# Patient Record
Sex: Male | Born: 1940 | Race: Black or African American | Hispanic: No | Marital: Married | Smoking: Former smoker
Health system: Southern US, Community
[De-identification: ages and names within clinical notes are randomized; demographics above are authoritative.]

## PROBLEM LIST (undated history)

## (undated) DIAGNOSIS — F419 Anxiety disorder, unspecified: Secondary | ICD-10-CM

## (undated) DIAGNOSIS — M4802 Spinal stenosis, cervical region: Secondary | ICD-10-CM

## (undated) DIAGNOSIS — D649 Anemia, unspecified: Secondary | ICD-10-CM

## (undated) DIAGNOSIS — I639 Cerebral infarction, unspecified: Secondary | ICD-10-CM

## (undated) DIAGNOSIS — W19XXXA Unspecified fall, initial encounter: Secondary | ICD-10-CM

## (undated) DIAGNOSIS — F329 Major depressive disorder, single episode, unspecified: Secondary | ICD-10-CM

## (undated) DIAGNOSIS — E785 Hyperlipidemia, unspecified: Secondary | ICD-10-CM

## (undated) DIAGNOSIS — I10 Essential (primary) hypertension: Secondary | ICD-10-CM

## (undated) DIAGNOSIS — R296 Repeated falls: Secondary | ICD-10-CM

## (undated) DIAGNOSIS — R443 Hallucinations, unspecified: Secondary | ICD-10-CM

## (undated) DIAGNOSIS — F32A Depression, unspecified: Secondary | ICD-10-CM

## (undated) HISTORY — DX: Anxiety disorder, unspecified: F41.9

## (undated) HISTORY — PX: POSTERIOR CERVICAL LAMINECTOMY: SHX2248

## (undated) HISTORY — DX: Hyperlipidemia, unspecified: E78.5

## (undated) HISTORY — DX: Anemia, unspecified: D64.9

## (undated) HISTORY — PX: HERNIA REPAIR: SHX51

## (undated) HISTORY — DX: Spinal stenosis, cervical region: M48.02

## (undated) HISTORY — DX: Repeated falls: R29.6

## (undated) HISTORY — DX: Cerebral infarction, unspecified: I63.9

## (undated) HISTORY — PX: EYE SURGERY: SHX253

## (undated) HISTORY — DX: Unspecified fall, initial encounter: W19.XXXA

---

## 2002-06-09 ENCOUNTER — Emergency Department (HOSPITAL_COMMUNITY): Admission: EM | Admit: 2002-06-09 | Discharge: 2002-06-09 | Payer: Self-pay | Admitting: Emergency Medicine

## 2002-06-09 ENCOUNTER — Encounter: Payer: Self-pay | Admitting: Emergency Medicine

## 2002-06-18 ENCOUNTER — Encounter: Payer: Self-pay | Admitting: General Surgery

## 2002-06-18 ENCOUNTER — Ambulatory Visit (HOSPITAL_COMMUNITY): Admission: RE | Admit: 2002-06-18 | Discharge: 2002-06-18 | Payer: Self-pay | Admitting: General Surgery

## 2002-06-20 ENCOUNTER — Ambulatory Visit (HOSPITAL_COMMUNITY): Admission: RE | Admit: 2002-06-20 | Discharge: 2002-06-20 | Payer: Self-pay | Admitting: Internal Medicine

## 2002-06-21 ENCOUNTER — Encounter: Payer: Self-pay | Admitting: General Surgery

## 2005-05-31 ENCOUNTER — Emergency Department (HOSPITAL_COMMUNITY): Admission: EM | Admit: 2005-05-31 | Discharge: 2005-05-31 | Payer: Self-pay | Admitting: Emergency Medicine

## 2009-05-27 ENCOUNTER — Ambulatory Visit (HOSPITAL_COMMUNITY): Admission: RE | Admit: 2009-05-27 | Discharge: 2009-05-27 | Payer: Self-pay | Admitting: Ophthalmology

## 2009-12-03 ENCOUNTER — Ambulatory Visit (HOSPITAL_COMMUNITY): Admission: RE | Admit: 2009-12-03 | Discharge: 2009-12-03 | Payer: Self-pay | Admitting: Family Medicine

## 2011-02-27 LAB — HEMOGLOBIN AND HEMATOCRIT, BLOOD: HCT: 35.1 % — ABNORMAL LOW (ref 39.0–52.0)

## 2011-02-27 LAB — BASIC METABOLIC PANEL
BUN: 15 mg/dL (ref 6–23)
CO2: 30 mEq/L (ref 19–32)
Calcium: 9.6 mg/dL (ref 8.4–10.5)
Chloride: 102 mEq/L (ref 96–112)
Creatinine, Ser: 1.14 mg/dL (ref 0.4–1.5)
GFR calc Af Amer: >60 mL/min (ref >60)
GFR calc non Af Amer: >60 mL/min (ref >60)
Glucose, Bld: 102 mg/dL — ABNORMAL HIGH (ref 70–99)
Potassium: 3.8 mEq/L (ref 3.5–5.1)
Sodium: 135 mEq/L (ref 135–145)

## 2014-02-12 DIAGNOSIS — E7800 Pure hypercholesterolemia, unspecified: Secondary | ICD-10-CM | POA: Diagnosis not present

## 2014-02-12 DIAGNOSIS — I10 Essential (primary) hypertension: Secondary | ICD-10-CM | POA: Diagnosis not present

## 2014-06-06 DIAGNOSIS — I10 Essential (primary) hypertension: Secondary | ICD-10-CM | POA: Diagnosis not present

## 2014-07-16 DIAGNOSIS — I10 Essential (primary) hypertension: Secondary | ICD-10-CM | POA: Diagnosis not present

## 2014-07-16 DIAGNOSIS — H5210 Myopia, unspecified eye: Secondary | ICD-10-CM | POA: Diagnosis not present

## 2014-07-16 DIAGNOSIS — H35039 Hypertensive retinopathy, unspecified eye: Secondary | ICD-10-CM | POA: Diagnosis not present

## 2014-09-24 DIAGNOSIS — E785 Hyperlipidemia, unspecified: Secondary | ICD-10-CM | POA: Diagnosis not present

## 2014-09-24 DIAGNOSIS — D649 Anemia, unspecified: Secondary | ICD-10-CM | POA: Diagnosis not present

## 2014-09-24 DIAGNOSIS — I10 Essential (primary) hypertension: Secondary | ICD-10-CM | POA: Diagnosis not present

## 2014-10-28 DIAGNOSIS — Z0000 Encounter for general adult medical examination without abnormal findings: Secondary | ICD-10-CM | POA: Diagnosis not present

## 2014-12-23 DIAGNOSIS — J029 Acute pharyngitis, unspecified: Secondary | ICD-10-CM | POA: Diagnosis not present

## 2014-12-23 DIAGNOSIS — D649 Anemia, unspecified: Secondary | ICD-10-CM | POA: Diagnosis not present

## 2015-03-17 DIAGNOSIS — D649 Anemia, unspecified: Secondary | ICD-10-CM | POA: Diagnosis not present

## 2015-03-17 DIAGNOSIS — I10 Essential (primary) hypertension: Secondary | ICD-10-CM | POA: Diagnosis not present

## 2015-06-17 DIAGNOSIS — I10 Essential (primary) hypertension: Secondary | ICD-10-CM | POA: Diagnosis not present

## 2015-06-17 DIAGNOSIS — D649 Anemia, unspecified: Secondary | ICD-10-CM | POA: Diagnosis not present

## 2015-10-07 DIAGNOSIS — F064 Anxiety disorder due to known physiological condition: Secondary | ICD-10-CM | POA: Diagnosis not present

## 2015-10-07 DIAGNOSIS — I10 Essential (primary) hypertension: Secondary | ICD-10-CM | POA: Diagnosis not present

## 2015-11-24 DIAGNOSIS — H2513 Age-related nuclear cataract, bilateral: Secondary | ICD-10-CM | POA: Diagnosis not present

## 2015-12-07 DIAGNOSIS — I10 Essential (primary) hypertension: Secondary | ICD-10-CM | POA: Diagnosis not present

## 2015-12-10 DIAGNOSIS — H2512 Age-related nuclear cataract, left eye: Secondary | ICD-10-CM | POA: Diagnosis not present

## 2015-12-10 DIAGNOSIS — H40013 Open angle with borderline findings, low risk, bilateral: Secondary | ICD-10-CM | POA: Diagnosis not present

## 2015-12-10 DIAGNOSIS — Z961 Presence of intraocular lens: Secondary | ICD-10-CM | POA: Diagnosis not present

## 2015-12-10 DIAGNOSIS — H35031 Hypertensive retinopathy, right eye: Secondary | ICD-10-CM | POA: Diagnosis not present

## 2015-12-10 DIAGNOSIS — H26491 Other secondary cataract, right eye: Secondary | ICD-10-CM | POA: Diagnosis not present

## 2015-12-10 DIAGNOSIS — H35032 Hypertensive retinopathy, left eye: Secondary | ICD-10-CM | POA: Diagnosis not present

## 2015-12-10 DIAGNOSIS — H25012 Cortical age-related cataract, left eye: Secondary | ICD-10-CM | POA: Diagnosis not present

## 2015-12-14 DIAGNOSIS — H26491 Other secondary cataract, right eye: Secondary | ICD-10-CM | POA: Diagnosis not present

## 2015-12-22 DIAGNOSIS — H2512 Age-related nuclear cataract, left eye: Secondary | ICD-10-CM | POA: Diagnosis not present

## 2016-01-18 DIAGNOSIS — I10 Essential (primary) hypertension: Secondary | ICD-10-CM | POA: Diagnosis not present

## 2016-01-18 DIAGNOSIS — D649 Anemia, unspecified: Secondary | ICD-10-CM | POA: Diagnosis not present

## 2016-01-18 DIAGNOSIS — E785 Hyperlipidemia, unspecified: Secondary | ICD-10-CM | POA: Diagnosis not present

## 2016-01-22 ENCOUNTER — Emergency Department (INDEPENDENT_AMBULATORY_CARE_PROVIDER_SITE_OTHER)
Admission: EM | Admit: 2016-01-22 | Discharge: 2016-01-22 | Disposition: A | Discharge status: Home / Self Care | Payer: Medicare Other | Source: Home / Self Care | Attending: Family Medicine | Admitting: Family Medicine

## 2016-01-22 ENCOUNTER — Encounter (HOSPITAL_COMMUNITY): Payer: Self-pay | Admitting: Nurse Practitioner

## 2016-01-22 ENCOUNTER — Emergency Department (INDEPENDENT_AMBULATORY_CARE_PROVIDER_SITE_OTHER): Payer: Medicare Other

## 2016-01-22 DIAGNOSIS — J111 Influenza due to unidentified influenza virus with other respiratory manifestations: Secondary | ICD-10-CM | POA: Diagnosis not present

## 2016-01-22 DIAGNOSIS — J449 Chronic obstructive pulmonary disease, unspecified: Secondary | ICD-10-CM | POA: Diagnosis not present

## 2016-01-22 DIAGNOSIS — R69 Illness, unspecified: Principal | ICD-10-CM

## 2016-01-22 HISTORY — DX: Essential (primary) hypertension: I10

## 2016-01-22 LAB — POCT I-STAT, CHEM 8
BUN: 21 mg/dL — AB (ref 6–20)
CALCIUM ION: 1.18 mmol/L (ref 1.13–1.30)
CHLORIDE: 99 mmol/L — AB (ref 101–111)
Creatinine, Ser: 1.3 mg/dL — ABNORMAL HIGH (ref 0.61–1.24)
Glucose, Bld: 106 mg/dL — ABNORMAL HIGH (ref 65–99)
HEMATOCRIT: 34 % — AB (ref 39.0–52.0)
Hemoglobin: 11.6 g/dL — ABNORMAL LOW (ref 13.0–17.0)
Potassium: 3.3 mmol/L — ABNORMAL LOW (ref 3.5–5.1)
SODIUM: 138 mmol/L (ref 135–145)
TCO2: 26 mmol/L (ref 0–100)

## 2016-01-22 MED ORDER — CENTRUM SILVER ADULT 50+ PO TABS: 1.0000 | ORAL_TABLET | Freq: Two times a day (BID) | ORAL | Status: AC

## 2016-01-22 NOTE — ED Notes (Signed)
Pt c/o 10 day history of weakness since he was diagnosed with a viral illness by his PCP. He reports initially he had flu symptoms, which have resolved, but since then hes continued to feel weak. He has to hold on to objects while standing because he feels too weak to stand on his own. He denies pain, cough, fevers, falls, dizziness. He is A&Ox4, breathing easily

## 2016-01-22 NOTE — ED Provider Notes (Signed)
CSN: [id number]     Arrival date & time 01/22/16  1411 History   First MD Initiated Contact with Patient 01/22/16 1639     Chief Complaint  Patient presents with  . Weakness   (Consider location/radiation/quality/duration/timing/severity/associated sxs/prior Treatment) Patient is a 75 y.o. male presenting with weakness. The history is provided by the patient and the spouse.  Weakness This is a new problem. The current episode started more than 1 week ago. The problem has been gradually worsening. Pertinent negatives include no chest pain, no abdominal pain, no headaches and no shortness of breath.    Past Medical History  Diagnosis Date  . Hypertension    Past Surgical History  Procedure Laterality Date  . Eye surgery     History reviewed. No pertinent family history. Social History  Substance Use Topics  . Smoking status: Former Smoker    Quit date: 11/21/1985  . Smokeless tobacco: None  . Alcohol Use: No    Review of Systems  Constitutional: Negative for fever and chills.  HENT: Positive for congestion and postnasal drip.   Respiratory: Negative for cough, shortness of breath and wheezing.   Cardiovascular: Negative.  Negative for chest pain and palpitations.  Gastrointestinal: Negative for abdominal pain.  Genitourinary: Negative.   Neurological: Positive for weakness. Negative for headaches.  All other systems reviewed and are negative.   Allergies  Review of patient's allergies indicates no known allergies.  Home Medications   Prior to Admission medications   Medication Sig Start Date End Date Taking? Authorizing Provider  losartan (COZAAR) 25 MG tablet Take 25 mg by mouth daily.   Yes Historical Provider, MD  Multiple Vitamins-Minerals (CENTRUM SILVER ADULT 50+) TABS Take 1 capsule by mouth 2 (two) times daily. 01/22/16   Billy Fischer, MD   Meds Ordered and Administered this Visit  Medications - No data to display  BP 105/67 mmHg  Pulse 64  Temp(Src) 97.7  F (36.5 C) (Oral)  Resp 16  SpO2 98% No data found.   Physical Exam  Constitutional: He is oriented to person, place, and time. He appears well-developed and well-nourished. No distress.  HENT:  Right Ear: External ear normal.  Left Ear: External ear normal.  Mouth/Throat: Oropharynx is clear and moist.  Eyes: Pupils are equal, round, and reactive to light.  Neck: Normal range of motion. Neck supple.  Cardiovascular: Normal rate, normal heart sounds and intact distal pulses.   Pulmonary/Chest: Effort normal and breath sounds normal.  Abdominal: Soft. Bowel sounds are normal. He exhibits no distension. There is no tenderness.  Musculoskeletal: Normal range of motion. He exhibits no edema.  Lymphadenopathy:    He has no cervical adenopathy.  Neurological: He is alert and oriented to person, place, and time.  Skin: Skin is warm and dry.  Nursing note and vitals reviewed.   ED Course  Procedures (including critical care time)  Labs Review Labs Reviewed  POCT I-STAT, CHEM 8 - Abnormal; Notable for the following:    Potassium 3.3 (*)    Chloride 99 (*)    BUN 21 (*)    Creatinine, Ser 1.30 (*)    Glucose, Bld 106 (*)    Hemoglobin 11.6 (*)    HCT 34.0 (*)    All other components within normal limits   i-stat wnl  Imaging Review Dg Chest 2 View  01/22/2016  CLINICAL DATA:  Has not felt well for 2 weeks after working in yard, upset stomach, fatigue, wheezing, question viral infection,  hypertension, former smoker EXAM: CHEST  2 VIEW COMPARISON:  None. FINDINGS: Normal heart size, mediastinal contours, and pulmonary vascularity. Atherosclerotic calcification aorta. Hyperinflated lungs consistent with COPD. No pulmonary infiltrate, pleural effusion or pneumothorax. Question 7 mm nodular density RIGHT upper lobe. Bones demineralized with scoliosis and degenerative changes. IMPRESSION: COPD changes with 7 mm RIGHT upper lobe nodular density; CT chest recommended to exclude pulmonary  nodule. Electronically Signed   By: Lavonia Dana M.D.   On: 01/22/2016 17:13   X-rays reviewed and report per radiologist.   Visual Acuity Review  Right Eye Distance:   Left Eye Distance:   Bilateral Distance:    Right Eye Near:   Left Eye Near:    Bilateral Near:         MDM   1. Influenza-like illness    Meds ordered this encounter  Medications  . losartan (COZAAR) 25 MG tablet    Sig: Take 25 mg by mouth daily.  . Multiple Vitamins-Minerals (CENTRUM SILVER ADULT 50+) TABS    Sig: Take 1 capsule by mouth 2 (two) times daily.    Dispense:  30 tablet    Refill:  2       Billy Fischer, MD 01/22/16 [phone]

## 2016-04-25 DIAGNOSIS — I10 Essential (primary) hypertension: Secondary | ICD-10-CM | POA: Diagnosis not present

## 2016-04-25 DIAGNOSIS — E785 Hyperlipidemia, unspecified: Secondary | ICD-10-CM | POA: Diagnosis not present

## 2016-04-25 DIAGNOSIS — D649 Anemia, unspecified: Secondary | ICD-10-CM | POA: Diagnosis not present

## 2016-07-26 DIAGNOSIS — D649 Anemia, unspecified: Secondary | ICD-10-CM | POA: Diagnosis not present

## 2016-07-26 DIAGNOSIS — E785 Hyperlipidemia, unspecified: Secondary | ICD-10-CM | POA: Diagnosis not present

## 2016-07-26 DIAGNOSIS — I10 Essential (primary) hypertension: Secondary | ICD-10-CM | POA: Diagnosis not present

## 2016-10-24 DIAGNOSIS — Z23 Encounter for immunization: Secondary | ICD-10-CM | POA: Diagnosis not present

## 2016-10-24 DIAGNOSIS — F419 Anxiety disorder, unspecified: Secondary | ICD-10-CM | POA: Diagnosis not present

## 2016-10-24 DIAGNOSIS — K5900 Constipation, unspecified: Secondary | ICD-10-CM | POA: Diagnosis not present

## 2016-10-24 DIAGNOSIS — Z6824 Body mass index (BMI) 24.0-24.9, adult: Secondary | ICD-10-CM | POA: Diagnosis not present

## 2016-10-24 DIAGNOSIS — D649 Anemia, unspecified: Secondary | ICD-10-CM | POA: Diagnosis not present

## 2016-10-24 DIAGNOSIS — I10 Essential (primary) hypertension: Secondary | ICD-10-CM | POA: Diagnosis not present

## 2016-11-30 DIAGNOSIS — I10 Essential (primary) hypertension: Secondary | ICD-10-CM | POA: Diagnosis not present

## 2016-11-30 DIAGNOSIS — J069 Acute upper respiratory infection, unspecified: Secondary | ICD-10-CM | POA: Diagnosis not present

## 2016-12-27 ENCOUNTER — Ambulatory Visit: Payer: Medicare Other | Admitting: Family

## 2017-01-26 DIAGNOSIS — H35033 Hypertensive retinopathy, bilateral: Secondary | ICD-10-CM | POA: Diagnosis not present

## 2017-02-13 DIAGNOSIS — I10 Essential (primary) hypertension: Secondary | ICD-10-CM | POA: Diagnosis not present

## 2017-02-13 DIAGNOSIS — N400 Enlarged prostate without lower urinary tract symptoms: Secondary | ICD-10-CM | POA: Diagnosis not present

## 2017-02-13 DIAGNOSIS — D649 Anemia, unspecified: Secondary | ICD-10-CM | POA: Diagnosis not present

## 2017-02-26 ENCOUNTER — Encounter (HOSPITAL_COMMUNITY): Payer: Self-pay

## 2017-02-26 DIAGNOSIS — Z79899 Other long term (current) drug therapy: Secondary | ICD-10-CM | POA: Insufficient documentation

## 2017-02-26 DIAGNOSIS — Z87891 Personal history of nicotine dependence: Secondary | ICD-10-CM | POA: Diagnosis not present

## 2017-02-26 DIAGNOSIS — I10 Essential (primary) hypertension: Secondary | ICD-10-CM | POA: Diagnosis not present

## 2017-02-26 DIAGNOSIS — R4182 Altered mental status, unspecified: Secondary | ICD-10-CM | POA: Insufficient documentation

## 2017-02-26 DIAGNOSIS — F22 Delusional disorders: Secondary | ICD-10-CM | POA: Diagnosis not present

## 2017-02-26 DIAGNOSIS — R440 Auditory hallucinations: Secondary | ICD-10-CM | POA: Diagnosis present

## 2017-02-26 LAB — COMPREHENSIVE METABOLIC PANEL
ALT: 18 U/L (ref 17–63)
AST: 21 U/L (ref 15–41)
Albumin: 4 g/dL (ref 3.5–5.0)
Alkaline Phosphatase: 52 U/L (ref 38–126)
Anion gap: 11 (ref 5–15)
BILIRUBIN TOTAL: 0.8 mg/dL (ref 0.3–1.2)
BUN: 14 mg/dL (ref 6–20)
CO2: 24 mmol/L (ref 22–32)
Calcium: 9.8 mg/dL (ref 8.9–10.3)
Chloride: 100 mmol/L — ABNORMAL LOW (ref 101–111)
Creatinine, Ser: 1.3 mg/dL — ABNORMAL HIGH (ref 0.61–1.24)
GFR calc Af Amer: >60 mL/min (ref >60)
GFR, EST NON AFRICAN AMERICAN: 52 mL/min — AB (ref >60)
Glucose, Bld: 116 mg/dL — ABNORMAL HIGH (ref 65–99)
POTASSIUM: 3.3 mmol/L — AB (ref 3.5–5.1)
Sodium: 135 mmol/L (ref 135–145)
TOTAL PROTEIN: 7 g/dL (ref 6.5–8.1)

## 2017-02-26 LAB — RAPID URINE DRUG SCREEN, HOSP PERFORMED
Amphetamines: NOT DETECTED
BARBITURATES: NOT DETECTED
Benzodiazepines: NOT DETECTED
Cocaine: NOT DETECTED
OPIATES: NOT DETECTED
TETRAHYDROCANNABINOL: NOT DETECTED

## 2017-02-26 LAB — CBC
HEMATOCRIT: 37.1 % — AB (ref 39.0–52.0)
Hemoglobin: 12.1 g/dL — ABNORMAL LOW (ref 13.0–17.0)
MCH: 27.8 pg (ref 26.0–34.0)
MCHC: 32.6 g/dL (ref 30.0–36.0)
MCV: 85.3 fL (ref 78.0–100.0)
Platelets: 259 10*3/uL (ref 150–400)
RBC: 4.35 MIL/uL (ref 4.22–5.81)
RDW: 12.2 % (ref 11.5–15.5)
WBC: 5.7 10*3/uL (ref 4.0–10.5)

## 2017-02-26 LAB — ETHANOL

## 2017-02-26 NOTE — ED Triage Notes (Signed)
Pt states visual and auditory hallucinations. Pt denies any SI/HI. Pt states hx of same, no past treatment. Per family, pt with delusions of house being video taped.

## 2017-02-27 ENCOUNTER — Emergency Department (HOSPITAL_COMMUNITY): Payer: Medicare Other

## 2017-02-27 ENCOUNTER — Emergency Department (HOSPITAL_COMMUNITY)
Admission: EM | Admit: 2017-02-27 | Discharge: 2017-02-27 | Disposition: A | Discharge status: Home / Self Care | Payer: Medicare Other | Attending: Emergency Medicine | Admitting: Emergency Medicine

## 2017-02-27 DIAGNOSIS — R4182 Altered mental status, unspecified: Secondary | ICD-10-CM | POA: Diagnosis not present

## 2017-02-27 DIAGNOSIS — F22 Delusional disorders: Secondary | ICD-10-CM

## 2017-02-27 LAB — URINALYSIS, ROUTINE W REFLEX MICROSCOPIC
Bilirubin Urine: NEGATIVE
GLUCOSE, UA: NEGATIVE mg/dL
Hgb urine dipstick: NEGATIVE
Ketones, ur: NEGATIVE mg/dL
NITRITE: NEGATIVE
PH: 5 (ref 5.0–8.0)
Protein, ur: NEGATIVE mg/dL
Specific Gravity, Urine: 1.015 (ref 1.005–1.030)

## 2017-02-27 MED ORDER — LOSARTAN POTASSIUM 50 MG PO TABS
50.0000 mg | ORAL_TABLET | Freq: Every day | ORAL | Status: DC
  Administered 2017-02-27: 50 mg via ORAL
  Filled 2017-02-27: qty 1

## 2017-02-27 MED ORDER — ONDANSETRON HCL 4 MG PO TABS: 4.0000 mg | ORAL_TABLET | Freq: Three times a day (TID) | ORAL | Status: DC | PRN

## 2017-02-27 MED ORDER — LOSARTAN POTASSIUM-HCTZ 50-12.5 MG PO TABS: 1.0000 | ORAL_TABLET | Freq: Every day | ORAL | Status: DC

## 2017-02-27 MED ORDER — ACETAMINOPHEN 325 MG PO TABS: 650.0000 mg | ORAL_TABLET | ORAL | Status: DC | PRN

## 2017-02-27 MED ORDER — LORAZEPAM 1 MG PO TABS
1.0000 mg | ORAL_TABLET | Freq: Three times a day (TID) | ORAL | Status: DC | PRN
Start: 2017-02-27 — End: 2017-02-27

## 2017-02-27 MED ORDER — LORAZEPAM 1 MG PO TABS
1.0000 mg | ORAL_TABLET | Freq: Once | ORAL | Status: AC
  Administered 2017-02-27: 1 mg via ORAL
  Filled 2017-02-27: qty 1

## 2017-02-27 MED ORDER — HYDROCHLOROTHIAZIDE 12.5 MG PO CAPS
12.5000 mg | ORAL_CAPSULE | Freq: Every day | ORAL | Status: DC
  Administered 2017-02-27: 12.5 mg via ORAL
  Filled 2017-02-27: qty 1

## 2017-02-27 NOTE — ED Notes (Signed)
Pt requesting to speak to his wife, this nurse attempted to contact pt's wife with phone number provided in chart but she did not answer the phone. Will try again shortly.

## 2017-02-27 NOTE — ED Notes (Signed)
Pt and family requesting to go home. This RN to speak with MD about patient's concerns

## 2017-02-27 NOTE — ED Notes (Signed)
RN provided [facility] holding patient visitor information handout; pts spouse states she is going home to freshen up and complete home tasks; states she will return

## 2017-02-27 NOTE — ED Notes (Signed)
Please contact wife, Macai Sisneros, with any changes in pt care. [phone]

## 2017-02-27 NOTE — ED Notes (Signed)
Pt states he does not need "that kind of help" and wants to leave. Pt telling this nurse he needs to go home so he can take care of his son and his mother who need him. Pt states "I don't need anger managament", this nurse explained he would not be getting treatment for anger but for his hallucinations. Pt reports he has not been hearing or seeing anything and that he feels better and wants to go home. Dr. Wilson Singer aware, per Dr. Wilson Singer he will come over to [facility] to speak with pt.

## 2017-02-27 NOTE — ED Notes (Signed)
RN called main lab to add on urinalysis to UDS

## 2017-02-27 NOTE — ED Notes (Signed)
Pt spoke with MD and made aware that patient needed to be encouraged to stay. Family verbalized understanding.

## 2017-02-27 NOTE — ED Notes (Signed)
Pt placed in burgundy scrubs and to be wanded by Security.

## 2017-02-27 NOTE — ED Notes (Signed)
D/c instructions reviewed with pt and family in the room, wife verbalized understanding of instructions and resources provided. Pt ambulatory to lobby with wife and daughter. Per pt wife, pt had no belongings in the ED, pt's wife had pt's clothes and personal belongings.

## 2017-02-27 NOTE — ED Notes (Signed)
Followed up with Dr. Wilson Singer, will come speak with pt. This nurse called pt wife in an attempt to have her calm pt down, while on the phone pt's wife reported "he wants to go home and I want him home. He isn't going to be satisfied until he gets what he wants". This nurse explained to pt's wife the provider is coming to speak with the pt but as of right now, the NP that assessed the pt feels as though it is best the pt be placed inpatient.

## 2017-02-27 NOTE — Progress Notes (Signed)
Pt meets criteria for inpatient admission; CSW faxed referral packet to the following facilities in attempts to secure inpatient bed:   [facility]  TTS will continue to seek placement.   Adriana Reams, LCSW Clinical Social Work [phone]

## 2017-02-27 NOTE — ED Notes (Signed)
Patient transported to CT 

## 2017-02-27 NOTE — ED Notes (Signed)
Dr. Kohut at bedside 

## 2017-02-27 NOTE — BH Assessment (Addendum)
Tele Assessment Note   Devin Valdez is an 76 y.o. male who presents to the ED voluntarily accompanied by his wife. Per reports from the chart, the pt has been behaving bizarrely at home and expressing paranoid delusions. Throughout the assessment, the pt continued speaking in tangents making statements such as "I saw something I wasn't supposed to see. 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 I'm glad ya'll choose [place] to do so. My wife thinks I'm crazy. I'm not crazy. Ya'll are crazy. Mental Health is a serious problem in this country. I could destroy my house and my family. I just want to tell my story. My son has MSE. Ya'll saw that he needed help and ya'll didn't do anything. Arnetha Gula just let a 76 year old woman drive the [address]. There should be an age limit to having a drivers license." Pt then began speaking about his desire to gold and stated "I'm not competing anymore because ya'll try to take everything away and then the depression comes."  Pt denies SI or HI. Pt admits to [place]. Pt stated "I'm not mentally ill. This country is mentally ill." Pt continued speaking in tangents about gun violence in the country and "[place] is not great." Pt reports he sees people at a diner that are evaluating him and taking notes on him. Pt stated "you do your best and it still won't be good enough." Pt reports he has not slept in 4 weeks. Per chart, the pt has not slept in 2 days and believes "the enemy" does not want him to tell his story. Pt expressed paranoid thoughts and "thinks someone is trying to kill him."   Pt denies prior psych history. Wife reports the pt began acting strangely about 6 days ago. Denies any psych hx prior to that.  Per Lindon Romp, NP pt meets criteria for geropsych placement. Report given to Loel Dubonnet, RN.  Diagnosis: Schizoaffective D/O   Past Medical History:  Past Medical History:  Diagnosis Date   Hypertension     Past Surgical History:  Procedure  Laterality Date   EYE SURGERY      Family History: History reviewed. No pertinent family history.  Social History:  reports that he quit smoking about 31 years ago. He has never used smokeless tobacco. He reports that he does not drink alcohol or use drugs.  Additional Social History:  Alcohol / Drug Use Pain Medications: See PTA meds Prescriptions: See PTA meds Over the Counter: See PTA meds History of alcohol / drug use?: No history of alcohol / drug abuse  CIWA: CIWA-Ar BP: (!) 164/78 Pulse Rate: 74 COWS:    PATIENT STRENGTHS: (choose at least two) Capable of independent living Child psychotherapist Supportive family/friends  Allergies: No Known Allergies  Home Medications:  (Not in a hospital admission)  OB/GYN Status:  No LMP for male patient.  General Assessment Data Location of Assessment: [facility] ED TTS Assessment: In system Is this a Tele or Face-to-Face Assessment?: Tele Assessment Is this an Initial Assessment or a Re-assessment for this encounter?: Initial Assessment Marital status: Married Is patient pregnant?: No Pregnancy Status: No Living Arrangements: Spouse/significant other, Children Can pt return to current living arrangement?: Yes Admission Status: Voluntary Is patient capable of signing voluntary admission?: Yes Referral Source: Self/Family/Friend Insurance type: Medicare     Crisis Care Plan Living Arrangements: Spouse/significant other, Children Name of Psychiatrist: none Name of Therapist: none  Education Status Is patient  currently in school?: No Highest grade of school patient has completed: 12th  Risk to self with the past 6 months Suicidal Ideation: No Has patient been a risk to self within the past 6 months prior to admission? : No Suicidal Intent: No Has patient had any suicidal intent within the past 6 months prior to admission? : No Is patient at risk for suicide?: No Suicidal Plan?: No Has patient had any  suicidal plan within the past 6 months prior to admission? : No Access to Means: No What has been your use of drugs/alcohol within the last 12 months?: denies Previous Attempts/Gestures: No Triggers for Past Attempts: None known Intentional Self Injurious Behavior: None Family Suicide History: No Recent stressful life event(s): Other (Comment) (denies recent stressors) Persecutory voices/beliefs?: No Depression: Yes Depression Symptoms: Tearfulness, Feeling angry/irritable Substance abuse history and/or treatment for substance abuse?: No Suicide prevention information given to non-admitted patients: Not applicable  Risk to Others within the past 6 months Homicidal Ideation: No Does patient have any lifetime risk of violence toward others beyond the six months prior to admission? : No Thoughts of Harm to Others: No Current Homicidal Intent: No Current Homicidal Plan: No Access to Homicidal Means: No History of harm to others?: No Assessment of Violence: None Noted Does patient have access to weapons?: No Criminal Charges Pending?: No Does patient have a court date: No Is patient on probation?: No  Psychosis Hallucinations: Visual, Auditory Delusions: Persecutory  Mental Status Report Appearance/Hygiene: Bizarre Eye Contact: Good Motor Activity: Hyperactivity, Restlessness Speech: Tangential Level of Consciousness: Alert, Crying Mood: Anxious, Depressed, Suspicious Affect: Fearful, Depressed, Anxious Anxiety Level: Moderate Thought Processes: Flight of Ideas Judgement: Impaired Orientation: Person Obsessive Compulsive Thoughts/Behaviors: None  Cognitive Functioning Concentration: Poor Memory: Remote Intact, Recent Intact IQ: Average Insight: Poor Impulse Control: Poor Appetite: Poor Weight Loss: 5 Sleep: Decreased Total Hours of Sleep:  (reports he has not slept in 4 weeks ) Vegetative Symptoms: None  ADLScreening [facility] Assessment Services) Patient's cognitive  ability adequate to safely complete daily activities?: Yes Patient able to express need for assistance with ADLs?: Yes Independently performs ADLs?: Yes (appropriate for developmental age)  Prior Inpatient Therapy Prior Inpatient Therapy: No  Prior Outpatient Therapy Prior Outpatient Therapy: No Does patient have an ACCT team?: No Does patient have Intensive In-House Services?  : No Does patient have [facility] services? : No Does patient have P4CC services?: No  ADL Screening (condition at time of admission) Patient's cognitive ability adequate to safely complete daily activities?: Yes Is the patient deaf or have difficulty hearing?: No Does the patient have difficulty seeing, even when wearing glasses/contacts?: No Does the patient have difficulty concentrating, remembering, or making decisions?: Yes Patient able to express need for assistance with ADLs?: Yes Does the patient have difficulty dressing or bathing?: No Independently performs ADLs?: Yes (appropriate for developmental age) Does the patient have difficulty walking or climbing stairs?: No Weakness of Legs: None Weakness of Arms/Hands: None  Home Assistive Devices/Equipment Home Assistive Devices/Equipment: None    Abuse/Neglect Assessment (Assessment to be complete while patient is alone) Physical Abuse: Denies Verbal Abuse: Denies Sexual Abuse: Denies Exploitation of patient/patient's resources: Denies Self-Neglect: Denies     Regulatory affairs officer (For Healthcare) Does Patient Have a Medical Advance Directive?: No Would patient like information on creating a medical advance directive?: No - Patient declined    Additional Information 1:1 In Past 12 Months?: No CIRT Risk: No Elopement Risk: No Does patient have medical clearance?: Yes  Disposition:  Disposition Initial Assessment Completed for this Encounter: Yes Disposition of Patient: Inpatient treatment program Type of inpatient treatment program:  Adult (geropsych per Lindon Romp, NP)  Lyanne Co 02/27/2017 3:59 AM

## 2017-02-27 NOTE — ED Provider Notes (Addendum)
[facility] DEPT Provider Note   CSN: [id number] Arrival date & time: 02/26/17  2049  By signing my name below, I, Delton Prairie, attest that this documentation has been prepared under the direction and in the presence of Merryl Hacker, MD  Electronically Signed: Delton Prairie, ED Scribe. 02/27/17. 12:57 AM.   History   Chief Complaint Chief Complaint  Patient presents with  . Hallucinations    HPI Comments:  Devin Valdez is a 76 y.o. male, with a PMHx of HTN, who presents to the Emergency Department complaining of visual and auditory hallucinations x several days. Pt states "the enemy" does not want him to tell his story. He states he saw something he was not supposed to see. He states he saw people trying to kill him and his family. Pt states Dr. Wynetta Emery, the head of his church, and "his puppets" are discrediting him. Pt states he has recently had some vision changes and is "able to see clearer now". Wife reports the pt was fine last week but notes he began acting differently about 6 days ago. She states the pt thinks their house is being taped and that the FBI are after him. She states he has not slept in 2 nights. She notes this is the first time he has acted like this and denies a hx of similar issues. She notes the pt's sister recently died and notes she thinks everything stressful in his life is hitting him at once. Wife denies alcohol use and drug use.  Pt denies any recent illnesses, any recent physical problems, weakness, headaches or any other associated symptoms. No other complaints noted at this time.    The history is provided by the patient and the spouse. No language interpreter was used.    Past Medical History:  Diagnosis Date  . Hypertension     There are no active problems to display for this patient.   Past Surgical History:  Procedure Laterality Date  . EYE SURGERY      Home Medications    Prior to Admission medications   Medication Sig Start  Date End Date Taking? Authorizing Provider  losartan-hydrochlorothiazide (HYZAAR) 50-12.5 MG tablet Take 1 tablet by mouth daily. 02/17/17  Yes Historical Provider, MD  Multiple Vitamins-Minerals (CENTRUM SILVER ADULT 50+) TABS Take 1 capsule by mouth 2 (two) times daily. Patient not taking: Reported on 02/27/2017 01/22/16   Billy Fischer, MD    Family History History reviewed. No pertinent family history.  Social History Social History  Substance Use Topics  . Smoking status: Former Smoker    Quit date: 11/21/1985  . Smokeless tobacco: Never Used  . Alcohol use No     Allergies   Patient has no known allergies.   Review of Systems Review of Systems  Constitutional: Negative for fever.  Respiratory: Negative for shortness of breath.   Cardiovascular: Negative for chest pain.  Gastrointestinal: Negative for abdominal pain, nausea and vomiting.  Genitourinary: Negative for dysuria.  Neurological: Negative for speech difficulty, weakness, numbness and headaches.  Psychiatric/Behavioral: Positive for hallucinations and sleep disturbance. Negative for suicidal ideas.  All other systems reviewed and are negative.    Physical Exam Updated Vital Signs BP (!) 156/80   Pulse 78   Temp 98.1 F (36.7 C) (Oral)   Resp 13   Ht 5\' 7"  (1.702 m)   Wt 133 lb 4.8 oz (60.5 kg)   SpO2 99%   BMI 20.88 kg/m   Physical Exam  Constitutional: He  appears well-developed and well-nourished. No distress.  HENT:  Head: Normocephalic and atraumatic.  Eyes: EOM are normal. Pupils are equal, round, and reactive to light.  Cardiovascular: Normal rate, regular rhythm and normal heart sounds.   No murmur heard. Pulmonary/Chest: Effort normal and breath sounds normal. No respiratory distress. He has no wheezes.  Abdominal: Soft. Bowel sounds are normal. There is no tenderness. There is no rebound.  Musculoskeletal: He exhibits no edema.  Neurological: He is alert.  Oriented to person and place,  confused regarding time, cranial nerves II through XII intact, no dysmetria to finger-nose-finger, speech fluent, can name and repeat, 5 out of 5 strength in all 4 extremities  Skin: Skin is warm and dry.  Psychiatric:  Tangential in speech, very long-winded, states that people don't believe him and that people are out to get him, appears paranoid, fixed delusions regarding people at church which his wife states are not true  Nursing note and vitals reviewed.    ED Treatments / Results  DIAGNOSTIC STUDIES:  Oxygen Saturation is 100% on RA, normal by my interpretation.    COORDINATION OF CARE:  12:57 AM Discussed treatment plan with pt at bedside and pt agreed to plan.  Labs (all labs ordered are listed, but only abnormal results are displayed) Labs Reviewed  COMPREHENSIVE METABOLIC PANEL - Abnormal; Notable for the following:       Result Value   Potassium 3.3 (*)    Chloride 100 (*)    Glucose, Bld 116 (*)    Creatinine, Ser 1.30 (*)    GFR calc non Af Amer 52 (*)    All other components within normal limits  CBC - Abnormal; Notable for the following:    Hemoglobin 12.1 (*)    HCT 37.1 (*)    All other components within normal limits  URINALYSIS, ROUTINE W REFLEX MICROSCOPIC - Abnormal; Notable for the following:    Leukocytes, UA TRACE (*)    Bacteria, UA RARE (*)    Squamous Epithelial / LPF 0-5 (*)    All other components within normal limits  ETHANOL  RAPID URINE DRUG SCREEN, HOSP PERFORMED    EKG  EKG Interpretation  Date/Time:  Monday February 27 2017 04:15:56 EDT Ventricular Rate:  75 PR Interval:    QRS Duration: 75 QT Interval:  378 QTC Calculation: 423 R Axis:   65 Text Interpretation:  Sinus rhythm Anterior infarct, old Confirmed by HORTON  MD, COURTNEY ([id number]) on 02/27/2017 4:19:16 AM       Radiology Ct Head Wo Contrast  Result Date: 02/27/2017 CLINICAL DATA:  Acute onset of auditory and visual hallucinations. Initial encounter. EXAM: CT HEAD WITHOUT  CONTRAST TECHNIQUE: Contiguous axial images were obtained from the base of the skull through the vertex without intravenous contrast. COMPARISON:  None. FINDINGS: Brain: No evidence of acute infarction, hemorrhage, hydrocephalus, extra-axial collection or mass lesion/mass effect. The posterior fossa, including the cerebellum, brainstem and fourth ventricle, is within normal limits. The third and lateral ventricles are borderline normal in size. The basal ganglia are unremarkable in appearance. The cerebral hemispheres are symmetric in appearance, with normal gray-white differentiation. No mass effect or midline shift is seen. Vascular: No hyperdense vessel or unexpected calcification. Skull: There is no evidence of fracture; visualized osseous structures are unremarkable in appearance. Sinuses/Orbits: The orbits are within normal limits. The paranasal sinuses and mastoid air cells are well-aerated. Other: No significant soft tissue abnormalities are seen. IMPRESSION: Unremarkable noncontrast CT of the head. Electronically Signed  By: Garald Balding M.D.   On: 02/27/2017 01:41    Procedures Procedures (including critical care time)  Medications Ordered in ED Medications  LORazepam (ATIVAN) tablet 1 mg (not administered)     Initial Impression / Assessment and Plan / ED Course  I have reviewed the triage vital signs and the nursing notes.  Pertinent labs & imaging results that were available during my care of the patient were reviewed by me and considered in my medical decision making (see chart for details).     Patient presents with altered mental status, delusions, and paranoia. He is nontoxic. Otherwise nonfocal. No recent illnesses. Fairly acute alteration in mental status on Tuesday. Wife states that he also has not been sleeping. He is very cooperative but does appear to have fixed delusions. Initial workup including UDS and EtOH reassuring. Urinalysis without evidence of infection. CT head  negative. I discussed the patient with Dr. Reino Bellis, neurology. Given otherwise neurologically intact, he does not feel that he needs further neurologic evaluation or MRI. We'll have TTS evaluate. Patient is medically cleared for TTS evaluation.  4:27 AM Patient meets inpatient criteria for geripsych placement.  Discussed this with the patient and his wife. Currently the patient is cooperative and voluntary. However, I did discuss with the wife that if he became resistant to admission, he may need commitment paperwork. Wife is agreeable to plan. He was given Ativan for sleep. EKG without QT prolongation.  Final Clinical Impressions(s) / ED Diagnoses   Final diagnoses:  Paranoia ([place])  Altered mental status, unspecified altered mental status type    New Prescriptions New Prescriptions   No medications on file   I personally performed the services described in this documentation, which was scribed in my presence. The recorded information has been reviewed and is accurate.     Merryl Hacker, MD 02/27/17 0330    Merryl Hacker, MD 02/27/17 [phone]

## 2017-02-27 NOTE — ED Notes (Signed)
Pt tearful, states "I'm hungry and I want to go home" this nurse offered to get pt food and told him the provider was on his way to speak to him. Pt denied offer for food and repeated "I just want to go home."

## 2017-02-27 NOTE — ED Notes (Signed)
Hassan Rowan, RN to move patient to [employer].

## 2017-02-27 NOTE — Consult Note (Signed)
Telepsych Consultation   Reason for Consult:  Reassessment for [facility] Referring Physician:   EPD-MD Sherwood Gambler Patient Identification: Devin Valdez MRN:  [id number] Principal Diagnosis: <principal problem not specified> Diagnosis:  There are no active problems to display for this patient.   Total Time spent with patient: 45 minutes  Subjective:   Devin Valdez is a 76 y.o. male patient reassessed this morning. Patient's wife is at bedside requesting to take patient home. Wife reports patient began acting "differently" about 3 days ago. [name] is awake, alert and oriented to person and place. continues to report delusions and vision of " making [place] great" patient is tangential but redirectable.  Denies past psychiatry history, denies that he is followed by psychiatry or that he is prescribed medication for mental health. Support, encouragement and reassurance was provided.   HPI: Per Tele- assessment noted on 02/27/2017  Devin Valdez is an 76 y.o. male who presents to the ED voluntarily accompanied by his wife. Per reports from the chart, the pt has been behaving bizarrely at home and expressing paranoid delusions. Throughout the assessment, the pt continued speaking in tangents making statements such as "I saw something I wasn't supposed to see. 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 I'm glad ya'll choose [place] to do so. My wife thinks I'm crazy. I'm not crazy. Ya'll are crazy. Mental Health is a serious problem in this country. I could destroy my house and my family. I just want to tell my story. My son has MSE. Ya'll saw that he needed help and ya'll didn't do anything. Arnetha Gula just let a 76 year old woman [address] the [address]. There should be an age limit to having a drivers license." Pt then began speaking about his desire to gold and stated "I'm not competing anymore because ya'll try to take everything away and then the depression comes."  Pt  denies SI or HI. Pt admits to [place]. Pt stated "I'm not mentally ill. This country is mentally ill." Pt continued speaking in tangents about gun violence in the country and "[place] is not great." Pt reports he sees people at a diner that are evaluating him and taking notes on him. Pt stated "you do your best and it still won't be good enough." Pt reports he has not slept in 4 weeks. Per chart, the pt has not slept in 2 days and believes "the enemy" does not want him to tell his story. Pt expressed paranoid thoughts and "thinks someone is trying to kill him."   Pt denies prior psych history. Wife reports the pt began acting strangely about 6 days ago. Denies any psych hx prior to that.  Past Psychiatric History:   Risk to Self: Suicidal Ideation: No Suicidal Intent: No Is patient at risk for suicide?: No Suicidal Plan?: No Access to Means: No What has been your use of drugs/alcohol within the last 12 months?: denies Triggers for Past Attempts: None known Intentional Self Injurious Behavior: None Risk to Others: Homicidal Ideation: No Thoughts of Harm to Others: No Current Homicidal Intent: No Current Homicidal Plan: No Access to Homicidal Means: No History of harm to others?: No Assessment of Violence: None Noted Does patient have access to weapons?: No Criminal Charges Pending?: No Does patient have a court date: No Prior Inpatient Therapy: Prior Inpatient Therapy: No Prior Outpatient Therapy: Prior Outpatient Therapy: No Does patient have an ACCT team?: No Does patient have Intensive In-House Services?  :  No Does patient have [facility] services? : No Does patient have P4CC services?: No  Past Medical History:  Past Medical History:  Diagnosis Date  . Hypertension     Past Surgical History:  Procedure Laterality Date  . EYE SURGERY     Family History: History reviewed. No pertinent family history. Family Psychiatric  History:  Social History:  History  Alcohol Use No      History  Drug Use No    Social History   Social History  . Marital status: Married    Spouse name: N/A  . Number of children: N/A  . Years of education: N/A   Social History Main Topics  . Smoking status: Former Smoker    Quit date: 11/21/1985  . Smokeless tobacco: Never Used  . Alcohol use No  . Drug use: No  . Sexual activity: Not Asked   Other Topics Concern  . None   Social History Narrative  . None   Additional Social History:    Allergies:  No Known Allergies  Labs:  Results for orders placed or performed during the hospital encounter of 02/27/17 (from the past 48 hour(s))  Comprehensive metabolic panel     Status: Abnormal   Collection Time: 02/26/17  9:23 PM  Result Value Ref Range   Sodium 135 135 - 145 mmol/L   Potassium 3.3 (L) 3.5 - 5.1 mmol/L   Chloride 100 (L) 101 - 111 mmol/L   CO2 24 22 - 32 mmol/L   Glucose, Bld 116 (H) 65 - 99 mg/dL   BUN 14 6 - 20 mg/dL   Creatinine, Ser 0.30 (H) 0.61 - 1.24 mg/dL   Calcium 9.8 8.9 - 14.9 mg/dL   Total Protein 7.0 6.5 - 8.1 g/dL   Albumin 4.0 3.5 - 5.0 g/dL   AST 21 15 - 41 U/L   ALT 18 17 - 63 U/L   Alkaline Phosphatase 52 38 - 126 U/L   Total Bilirubin 0.8 0.3 - 1.2 mg/dL   GFR calc non Af Amer 52 (L) >60 mL/min   GFR calc Af Amer >60 >60 mL/min    Comment: (NOTE) The eGFR has been calculated using the CKD EPI equation. This calculation has not been validated in all clinical situations. eGFR's persistently <60 mL/min signify possible Chronic Kidney Disease.    Anion gap 11 5 - 15  Ethanol     Status: None   Collection Time: 02/26/17  9:23 PM  Result Value Ref Range   Alcohol, Ethyl (B) <5 <5 mg/dL    Comment:        LOWEST DETECTABLE LIMIT FOR SERUM ALCOHOL IS 5 mg/dL FOR MEDICAL PURPOSES ONLY   cbc     Status: Abnormal   Collection Time: 02/26/17  9:23 PM  Result Value Ref Range   WBC 5.7 4.0 - 10.5 K/uL   RBC 4.35 4.22 - 5.81 MIL/uL   Hemoglobin 12.1 (L) 13.0 - 17.0 g/dL   HCT 96.9 (L)  24.9 - 52.0 %   MCV 85.3 78.0 - 100.0 fL   MCH 27.8 26.0 - 34.0 pg   MCHC 32.6 30.0 - 36.0 g/dL   RDW 32.4 19.9 - 14.4 %   Platelets 259 150 - 400 K/uL  Rapid urine drug screen (hospital performed)     Status: None   Collection Time: 02/26/17  9:26 PM  Result Value Ref Range   Opiates NONE DETECTED NONE DETECTED   Cocaine NONE DETECTED NONE DETECTED   Benzodiazepines NONE  DETECTED NONE DETECTED   Amphetamines NONE DETECTED NONE DETECTED   Tetrahydrocannabinol NONE DETECTED NONE DETECTED   Barbiturates NONE DETECTED NONE DETECTED    Comment:        DRUG SCREEN FOR MEDICAL PURPOSES ONLY.  IF CONFIRMATION IS NEEDED FOR ANY PURPOSE, NOTIFY LAB WITHIN 5 DAYS.        LOWEST DETECTABLE LIMITS FOR URINE DRUG SCREEN Drug Class       Cutoff (ng/mL) Amphetamine      1000 Barbiturate      200 Benzodiazepine   161 Tricyclics       096 Opiates          300 Cocaine          300 THC              50   Urinalysis, Routine w reflex microscopic     Status: Abnormal   Collection Time: 02/26/17  9:26 PM  Result Value Ref Range   Color, Urine YELLOW YELLOW   APPearance CLEAR CLEAR   Specific Gravity, Urine 1.015 1.005 - 1.030   pH 5.0 5.0 - 8.0   Glucose, UA NEGATIVE NEGATIVE mg/dL   Hgb urine dipstick NEGATIVE NEGATIVE   Bilirubin Urine NEGATIVE NEGATIVE   Ketones, ur NEGATIVE NEGATIVE mg/dL   Protein, ur NEGATIVE NEGATIVE mg/dL   Nitrite NEGATIVE NEGATIVE   Leukocytes, UA TRACE (A) NEGATIVE   RBC / HPF 0-5 0 - 5 RBC/hpf   WBC, UA 0-5 0 - 5 WBC/hpf   Bacteria, UA RARE (A) NONE SEEN   Squamous Epithelial / LPF 0-5 (A) NONE SEEN   Mucous PRESENT     Current Facility-Administered Medications  Medication Dose Route Frequency Provider Last Rate Last Dose  . acetaminophen (TYLENOL) tablet 650 mg  650 mg Oral Q4H PRN Sherwood Gambler, MD      . losartan (COZAAR) tablet 50 mg  50 mg Oral Daily Sherwood Gambler, MD   50 mg at 02/27/17 1103   And  . hydrochlorothiazide (MICROZIDE) capsule 12.5  mg  12.5 mg Oral Daily Sherwood Gambler, MD   12.5 mg at 02/27/17 1103  . LORazepam (ATIVAN) tablet 1 mg  1 mg Oral Q8H PRN Sherwood Gambler, MD      . ondansetron (ZOFRAN) tablet 4 mg  4 mg Oral Q8H PRN Sherwood Gambler, MD       Current Outpatient Prescriptions  Medication Sig Dispense Refill  . losartan-hydrochlorothiazide (HYZAAR) 50-12.5 MG tablet Take 1 tablet by mouth daily.    . Multiple Vitamins-Minerals (CENTRUM SILVER ADULT 50+) TABS Take 1 capsule by mouth 2 (two) times daily. (Patient not taking: Reported on 02/27/2017) 30 tablet 2    Musculoskeletal: Strength & Muscle Tone: UTA Gait & Station: UTA Patient leans: UTA- teleassessmet   Psychiatric Specialty Exam: Physical Exam  Nursing note and vitals reviewed. Cardiovascular: Normal rate.   Neurological: He is alert.  Psychiatric: He has a normal mood and affect. His behavior is normal.    ROS  Blood pressure 121/68, pulse 78, temperature 98.3 F (36.8 C), temperature source Oral, resp. rate 17, height '5\' 7"'$  (1.702 m), weight 60.5 kg (133 lb 4.8 oz), SpO2 100 %.Body mass index is 20.88 kg/m.  General Appearance: Casual  Eye Contact:  Fair  Speech:  Clear and Coherent and Pressured  Volume:  Normal  Mood:  Anxious  Affect:  Congruent  Thought Process:  Coherent  Orientation:  Other:  Self and Time  Thought Content:  Delusions, Paranoid Ideation,  Rumination and Tangential  Suicidal Thoughts:  No  Homicidal Thoughts:  No  Memory:  Immediate;   Fair Recent;   Fair Remote;   Fair  Judgement:  Fair  Insight:  Fair  Psychomotor Activity:  UTA  Concentration:  Concentration: Fair  Recall:  [employer] of Knowledge:  Fair  Language:  Good  Akathisia:  UTA  Handed:    AIMS (if indicated):     Assets:  Desire for Improvement Resilience Social Support  ADL's:  Intact  Cognition:  WNL  Sleep:        Treatment Plan Summary: Daily contact with patient to assess and evaluate symptoms and progress in treatment and  Medication management  -Spoke to MD Khout regarding disppositon  Disposition: Recommend psychiatric Inpatient admission when medically cleared.  -TTS to continue seeking inpatient treatment for  - [facility]  Derrill [place], NP 02/27/2017 2:28 PM    Agree with NP note

## 2017-02-27 NOTE — ED Provider Notes (Signed)
I spoke with patient at length. I think he would benefit from inpatient psychiatric care, but I do not feel I have the basis to IVC him. I do no think he is a serious  imminent threat to himself or others. He denies SI or HI. During my conversation he was calm and cooperative. His thought process was reasonably logical. He will be discharged per his request. He will be provided with outpt resources.    Virgel Manifold, MD 02/27/17 [phone]

## 2017-02-27 NOTE — ED Provider Notes (Signed)
Wife asking if patient can leave. I discussed with her it would be better to have patient admitted to help stabilize his psychiatric illness. According to her he is not suicidal or homicidal. Resting comfortably. At this time agrees to give process more time as placement is currently being sought after. TTS consulted again and will have psych NP assess patient as well   Sherwood Gambler, MD 02/27/17 [phone]

## 2017-02-27 NOTE — ED Notes (Signed)
PT ambulated to bathroom without difficulty and without attempt to leave.

## 2017-02-28 DIAGNOSIS — F22 Delusional disorders: Secondary | ICD-10-CM | POA: Diagnosis not present

## 2017-02-28 DIAGNOSIS — R440 Auditory hallucinations: Secondary | ICD-10-CM | POA: Diagnosis not present

## 2017-02-28 DIAGNOSIS — I10 Essential (primary) hypertension: Secondary | ICD-10-CM | POA: Diagnosis not present

## 2017-02-28 DIAGNOSIS — R404 Transient alteration of awareness: Secondary | ICD-10-CM | POA: Diagnosis not present

## 2017-03-01 DIAGNOSIS — I10 Essential (primary) hypertension: Secondary | ICD-10-CM | POA: Diagnosis not present

## 2017-03-01 DIAGNOSIS — R918 Other nonspecific abnormal finding of lung field: Secondary | ICD-10-CM | POA: Diagnosis not present

## 2017-03-01 DIAGNOSIS — F22 Delusional disorders: Secondary | ICD-10-CM | POA: Diagnosis not present

## 2017-03-01 DIAGNOSIS — J439 Emphysema, unspecified: Secondary | ICD-10-CM | POA: Diagnosis not present

## 2017-03-01 DIAGNOSIS — R4182 Altered mental status, unspecified: Secondary | ICD-10-CM | POA: Diagnosis not present

## 2017-03-01 DIAGNOSIS — R440 Auditory hallucinations: Secondary | ICD-10-CM | POA: Diagnosis not present

## 2017-03-01 DIAGNOSIS — I493 Ventricular premature depolarization: Secondary | ICD-10-CM | POA: Diagnosis not present

## 2017-03-01 DIAGNOSIS — R911 Solitary pulmonary nodule: Secondary | ICD-10-CM | POA: Diagnosis not present

## 2017-03-01 DIAGNOSIS — R404 Transient alteration of awareness: Secondary | ICD-10-CM | POA: Diagnosis not present

## 2017-03-02 DIAGNOSIS — R440 Auditory hallucinations: Secondary | ICD-10-CM | POA: Diagnosis not present

## 2017-03-02 DIAGNOSIS — R4182 Altered mental status, unspecified: Secondary | ICD-10-CM | POA: Diagnosis not present

## 2017-03-02 DIAGNOSIS — F22 Delusional disorders: Secondary | ICD-10-CM | POA: Diagnosis not present

## 2017-03-02 DIAGNOSIS — I10 Essential (primary) hypertension: Secondary | ICD-10-CM | POA: Diagnosis not present

## 2017-03-03 DIAGNOSIS — R410 Disorientation, unspecified: Secondary | ICD-10-CM | POA: Diagnosis not present

## 2017-03-03 DIAGNOSIS — I10 Essential (primary) hypertension: Secondary | ICD-10-CM | POA: Diagnosis not present

## 2017-03-03 DIAGNOSIS — F22 Delusional disorders: Secondary | ICD-10-CM | POA: Diagnosis not present

## 2017-03-03 DIAGNOSIS — R440 Auditory hallucinations: Secondary | ICD-10-CM | POA: Diagnosis not present

## 2017-03-04 DIAGNOSIS — R440 Auditory hallucinations: Secondary | ICD-10-CM | POA: Diagnosis not present

## 2017-03-04 DIAGNOSIS — F29 Unspecified psychosis not due to a substance or known physiological condition: Secondary | ICD-10-CM | POA: Diagnosis not present

## 2017-03-04 DIAGNOSIS — I10 Essential (primary) hypertension: Secondary | ICD-10-CM | POA: Diagnosis not present

## 2017-03-04 DIAGNOSIS — F22 Delusional disorders: Secondary | ICD-10-CM | POA: Diagnosis not present

## 2017-03-05 DIAGNOSIS — R930 Abnormal findings on diagnostic imaging of skull and head, not elsewhere classified: Secondary | ICD-10-CM | POA: Diagnosis not present

## 2017-03-05 DIAGNOSIS — R440 Auditory hallucinations: Secondary | ICD-10-CM | POA: Diagnosis not present

## 2017-03-05 DIAGNOSIS — F29 Unspecified psychosis not due to a substance or known physiological condition: Secondary | ICD-10-CM | POA: Diagnosis not present

## 2017-03-05 DIAGNOSIS — I10 Essential (primary) hypertension: Secondary | ICD-10-CM | POA: Diagnosis not present

## 2017-03-05 DIAGNOSIS — F22 Delusional disorders: Secondary | ICD-10-CM | POA: Diagnosis not present

## 2017-03-05 DIAGNOSIS — R4182 Altered mental status, unspecified: Secondary | ICD-10-CM | POA: Diagnosis not present

## 2017-03-06 DIAGNOSIS — F22 Delusional disorders: Secondary | ICD-10-CM | POA: Diagnosis not present

## 2017-03-06 DIAGNOSIS — R440 Auditory hallucinations: Secondary | ICD-10-CM | POA: Diagnosis not present

## 2017-03-06 DIAGNOSIS — R4182 Altered mental status, unspecified: Secondary | ICD-10-CM | POA: Diagnosis not present

## 2017-03-06 DIAGNOSIS — F29 Unspecified psychosis not due to a substance or known physiological condition: Secondary | ICD-10-CM | POA: Diagnosis not present

## 2017-03-06 DIAGNOSIS — I10 Essential (primary) hypertension: Secondary | ICD-10-CM | POA: Diagnosis not present

## 2017-03-07 DIAGNOSIS — R4182 Altered mental status, unspecified: Secondary | ICD-10-CM | POA: Diagnosis not present

## 2017-03-07 DIAGNOSIS — F22 Delusional disorders: Secondary | ICD-10-CM | POA: Diagnosis not present

## 2017-03-07 DIAGNOSIS — R440 Auditory hallucinations: Secondary | ICD-10-CM | POA: Diagnosis not present

## 2017-03-07 DIAGNOSIS — F29 Unspecified psychosis not due to a substance or known physiological condition: Secondary | ICD-10-CM | POA: Diagnosis not present

## 2017-03-07 DIAGNOSIS — I10 Essential (primary) hypertension: Secondary | ICD-10-CM | POA: Diagnosis not present

## 2017-03-07 DIAGNOSIS — R930 Abnormal findings on diagnostic imaging of skull and head, not elsewhere classified: Secondary | ICD-10-CM | POA: Diagnosis not present

## 2017-03-14 DIAGNOSIS — F209 Schizophrenia, unspecified: Secondary | ICD-10-CM | POA: Diagnosis not present

## 2017-03-14 DIAGNOSIS — I10 Essential (primary) hypertension: Secondary | ICD-10-CM | POA: Diagnosis not present

## 2017-03-17 DIAGNOSIS — R419 Unspecified symptoms and signs involving cognitive functions and awareness: Secondary | ICD-10-CM | POA: Diagnosis not present

## 2017-03-17 DIAGNOSIS — F323 Major depressive disorder, single episode, severe with psychotic features: Secondary | ICD-10-CM | POA: Diagnosis not present

## 2017-03-31 DIAGNOSIS — R419 Unspecified symptoms and signs involving cognitive functions and awareness: Secondary | ICD-10-CM | POA: Diagnosis not present

## 2017-03-31 DIAGNOSIS — F323 Major depressive disorder, single episode, severe with psychotic features: Secondary | ICD-10-CM | POA: Diagnosis not present

## 2017-04-03 DIAGNOSIS — I10 Essential (primary) hypertension: Secondary | ICD-10-CM | POA: Diagnosis not present

## 2017-04-14 DIAGNOSIS — F323 Major depressive disorder, single episode, severe with psychotic features: Secondary | ICD-10-CM | POA: Diagnosis not present

## 2017-04-14 DIAGNOSIS — R419 Unspecified symptoms and signs involving cognitive functions and awareness: Secondary | ICD-10-CM | POA: Diagnosis not present

## 2017-05-05 DIAGNOSIS — R419 Unspecified symptoms and signs involving cognitive functions and awareness: Secondary | ICD-10-CM | POA: Diagnosis not present

## 2017-05-05 DIAGNOSIS — F323 Major depressive disorder, single episode, severe with psychotic features: Secondary | ICD-10-CM | POA: Diagnosis not present

## 2017-05-14 ENCOUNTER — Encounter (HOSPITAL_COMMUNITY): Payer: Self-pay | Admitting: Emergency Medicine

## 2017-05-14 ENCOUNTER — Telehealth (HOSPITAL_COMMUNITY): Payer: Self-pay | Admitting: *Deleted

## 2017-05-14 ENCOUNTER — Ambulatory Visit (HOSPITAL_COMMUNITY)
Admission: EM | Admit: 2017-05-14 | Discharge: 2017-05-14 | Disposition: A | Discharge status: Home / Self Care | Payer: Medicare Other | Attending: Family Medicine | Admitting: Family Medicine

## 2017-05-14 DIAGNOSIS — R030 Elevated blood-pressure reading, without diagnosis of hypertension: Secondary | ICD-10-CM

## 2017-05-14 DIAGNOSIS — R5383 Other fatigue: Secondary | ICD-10-CM | POA: Diagnosis not present

## 2017-05-14 DIAGNOSIS — R350 Frequency of micturition: Secondary | ICD-10-CM

## 2017-05-14 LAB — POCT URINALYSIS DIP (DEVICE)
BILIRUBIN URINE: NEGATIVE
Glucose, UA: NEGATIVE mg/dL
HGB URINE DIPSTICK: NEGATIVE
Ketones, ur: NEGATIVE mg/dL
LEUKOCYTES UA: NEGATIVE
Nitrite: NEGATIVE
Protein, ur: NEGATIVE mg/dL
SPECIFIC GRAVITY, URINE: 1.02 (ref 1.005–1.030)
Urobilinogen, UA: 0.2 mg/dL (ref 0.0–1.0)
pH: 5.5 (ref 5.0–8.0)

## 2017-05-14 NOTE — ED Provider Notes (Signed)
[place]    CSN: [id number] Arrival date & time: 05/14/17  1158     History   Chief Complaint Chief Complaint  Patient presents with  . Urinary Frequency  . Fatigue    HPI Devin Valdez is a 76 y.o. male. Here with his wife.  HPI The patient has been having urinary frequency over the past 4 days. He goes approximately 5-10 times per day. He denies any fevers, pain, bleeding, flank pain, or testicular pain. The patient does not have a recurrent history of urinary tract infections. No discharge or new sexual partners. He is not circumcised. Of note, he states that over the past 4 days, his bowel movements have been harder and smaller intermittently.   Additionally, he's been having fatigue worsen usual over the past 2-3 weeks. He is still eating and drinking normally. He has lost 6-8 pounds unintentionally. He has a history of anemia but does not know why. He denies any chest pain, shortness of breath, cough, calf pain, recent travel, medication addition/discontinuation, or dose change of his blood pressure medicine.  Past Medical History:  Diagnosis Date  . Hypertension    Past Surgical History:  Procedure Laterality Date  . EYE SURGERY      Home Medications    Prior to Admission medications   Medication Sig Start Date End Date Taking? Authorizing Provider  losartan-hydrochlorothiazide (HYZAAR) 50-12.5 MG tablet Take 1 tablet by mouth daily. 02/17/17  Yes [provider]  Multiple Vitamins-Minerals (CENTRUM SILVER ADULT 50+) TABS Take 1 capsule by mouth 2 (two) times daily. 01/22/16  Yes Kindl, Nelda Severe, MD    Family History History reviewed. No pertinent family history.  Social History Social History  Substance Use Topics  . Smoking status: Former Smoker    Quit date: 11/21/1985  . Smokeless tobacco: Never Used  . Alcohol use No    Allergies   Patient has no known allergies.   Review of Systems Review of Systems  Constitutional:  Positive for fatigue.  Genitourinary: Positive for frequency.    Physical Exam Triage Vital Signs ED Triage Vitals [05/14/17 1214]  Enc Vitals Group     BP (!) 180/82     Pulse Rate 76     Temp 97.7 F (36.5 C)     Temp Source Oral     SpO2 97 %   Updated Vital Signs BP (!) 180/82 (BP Location: Left Arm)   Pulse 76   Temp 97.7 F (36.5 C) (Oral)   SpO2 97%   Physical Exam  Constitutional: He appears well-developed. No distress.  HENT:  Head: Normocephalic and atraumatic.  Nose: Nose normal.  Mouth/Throat: Oropharynx is clear and moist.  Eyes: EOM are normal. Pupils are equal, round, and reactive to light.  Neck: Normal range of motion. Neck supple. No thyromegaly present.  Cardiovascular: Normal rate and regular rhythm.   Murmur heard. Pulmonary/Chest: Effort normal and breath sounds normal. No respiratory distress.  Abdominal: Soft. Bowel sounds are normal. He exhibits no distension. There is no tenderness. There is no guarding.  Musculoskeletal:  No calf tenderness  Skin: Skin is warm and dry. Capillary refill takes less than 2 seconds. He is not diaphoretic.  Psychiatric: He has a normal mood and affect.     UC Treatments / Results  Labs (all labs ordered are listed, but only abnormal results are displayed) Labs Reviewed  POCT URINALYSIS DIP (DEVICE)    Procedures Procedures none  Initial Impression / Assessment and Plan /  UC Course  I have reviewed the triage vital signs and the nursing notes.  Pertinent labs & imaging results that were available during my care of the patient were reviewed by me and considered in my medical decision making (see chart for details).     Pt presents with urinary frequency and fatigue. Urinalysis is not suggestive of infection. Based on his history, it may be due to constipation. I am unable to perform labs here and suggest he follow up with his primary care physician for any workup deemed necessary. Based on physical exam  findings, I do not believe he is dehydrated. His urinalysis supports this. He does not have enough risk factors are complaints to suggest cardiac involvement. PE is of low likelihood.  He also mentions some unintentional weight loss. For which he should follow-up with his PCP for possible laboratory workup. He is discharged in stable condition. He was strongly encouraged to follow-up with his primary care provider to address some of these issues. In the meantime, suggested trying the laxative for smoother bowel movements. The patient is wife voiced understanding and agreement to the plan.  Final Clinical Impressions(s) / UC Diagnoses   Final diagnoses:  Fatigue, unspecified type  Urinary frequency  Elevated blood pressure reading       [name], DO 05/14/17 1334

## 2017-05-14 NOTE — Discharge Instructions (Signed)
It does not appear that you have a urinary tract infection today. If you start having burning, blood in the urine or fevers, return to urgent care or see Dr. Berdine Addison.  Sometimes constipation can cause urinary frequency by pushing against the bladder. Consider taking a laxative to see if this helps.  Stay well hydrated. There are no physical exam findings suggestive of dehydration. Your urine study was not suggestive of this either.   If you see Dr. Berdine Addison for your fatigue, there is a possibility he or one of his partners will draw labs there.

## 2017-05-14 NOTE — ED Notes (Signed)
Pt being taken to radiology by shuttle for xray.

## 2017-05-14 NOTE — ED Triage Notes (Signed)
Pt reports some days of more fatigue than usual over the last 2-3 weeks.  They also report urinary frequency over the last 4-5 days.  Pt denies any other urinary symptoms and states he has been drinking water every day.

## 2017-05-15 DIAGNOSIS — F4489 Other dissociative and conversion disorders: Secondary | ICD-10-CM | POA: Diagnosis not present

## 2017-05-15 DIAGNOSIS — F22 Delusional disorders: Secondary | ICD-10-CM | POA: Diagnosis not present

## 2017-05-15 DIAGNOSIS — R9431 Abnormal electrocardiogram [ECG] [EKG]: Secondary | ICD-10-CM | POA: Diagnosis not present

## 2017-05-15 DIAGNOSIS — F329 Major depressive disorder, single episode, unspecified: Secondary | ICD-10-CM | POA: Diagnosis not present

## 2017-05-15 DIAGNOSIS — R410 Disorientation, unspecified: Secondary | ICD-10-CM | POA: Diagnosis not present

## 2017-05-15 DIAGNOSIS — R4182 Altered mental status, unspecified: Secondary | ICD-10-CM | POA: Diagnosis not present

## 2017-05-20 ENCOUNTER — Emergency Department (HOSPITAL_COMMUNITY): Payer: Medicare Other

## 2017-05-20 ENCOUNTER — Encounter (HOSPITAL_COMMUNITY): Payer: Self-pay

## 2017-05-20 ENCOUNTER — Observation Stay (HOSPITAL_COMMUNITY)
Admission: EM | Admit: 2017-05-20 | Discharge: 2017-05-21 | Disposition: A | Discharge status: Home / Self Care | Payer: Medicare Other | Attending: Internal Medicine | Admitting: Internal Medicine

## 2017-05-20 ENCOUNTER — Observation Stay (HOSPITAL_COMMUNITY): Payer: Medicare Other

## 2017-05-20 DIAGNOSIS — R269 Unspecified abnormalities of gait and mobility: Secondary | ICD-10-CM

## 2017-05-20 DIAGNOSIS — R634 Abnormal weight loss: Secondary | ICD-10-CM | POA: Insufficient documentation

## 2017-05-20 DIAGNOSIS — R296 Repeated falls: Secondary | ICD-10-CM

## 2017-05-20 DIAGNOSIS — R531 Weakness: Secondary | ICD-10-CM | POA: Diagnosis not present

## 2017-05-20 DIAGNOSIS — Z79899 Other long term (current) drug therapy: Secondary | ICD-10-CM | POA: Insufficient documentation

## 2017-05-20 DIAGNOSIS — F329 Major depressive disorder, single episode, unspecified: Secondary | ICD-10-CM | POA: Diagnosis not present

## 2017-05-20 DIAGNOSIS — F32A Depression, unspecified: Secondary | ICD-10-CM

## 2017-05-20 DIAGNOSIS — D649 Anemia, unspecified: Secondary | ICD-10-CM

## 2017-05-20 DIAGNOSIS — W010XXA Fall on same level from slipping, tripping and stumbling without subsequent striking against object, initial encounter: Secondary | ICD-10-CM | POA: Insufficient documentation

## 2017-05-20 DIAGNOSIS — R2681 Unsteadiness on feet: Secondary | ICD-10-CM | POA: Diagnosis not present

## 2017-05-20 DIAGNOSIS — R2689 Other abnormalities of gait and mobility: Secondary | ICD-10-CM | POA: Diagnosis not present

## 2017-05-20 DIAGNOSIS — R42 Dizziness and giddiness: Secondary | ICD-10-CM | POA: Diagnosis not present

## 2017-05-20 DIAGNOSIS — I119 Hypertensive heart disease without heart failure: Secondary | ICD-10-CM | POA: Insufficient documentation

## 2017-05-20 DIAGNOSIS — Z9889 Other specified postprocedural states: Secondary | ICD-10-CM | POA: Diagnosis not present

## 2017-05-20 DIAGNOSIS — Z87891 Personal history of nicotine dependence: Secondary | ICD-10-CM | POA: Insufficient documentation

## 2017-05-20 DIAGNOSIS — S0990XA Unspecified injury of head, initial encounter: Secondary | ICD-10-CM | POA: Diagnosis not present

## 2017-05-20 HISTORY — DX: Depression, unspecified: F32.A

## 2017-05-20 HISTORY — DX: Hallucinations, unspecified: R44.3

## 2017-05-20 HISTORY — DX: Major depressive disorder, single episode, unspecified: F32.9

## 2017-05-20 LAB — COMPREHENSIVE METABOLIC PANEL
ALBUMIN: 4.4 g/dL (ref 3.5–5.0)
ALK PHOS: 48 U/L (ref 38–126)
ALT: 17 U/L (ref 17–63)
ANION GAP: 10 (ref 5–15)
AST: 16 U/L (ref 15–41)
BILIRUBIN TOTAL: 0.9 mg/dL (ref 0.3–1.2)
BUN: 25 mg/dL — AB (ref 6–20)
CALCIUM: 10.1 mg/dL (ref 8.9–10.3)
CO2: 29 mmol/L (ref 22–32)
Chloride: 102 mmol/L (ref 101–111)
Creatinine, Ser: 1.08 mg/dL (ref 0.61–1.24)
GFR calc Af Amer: >60 mL/min (ref >60)
GFR calc non Af Amer: >60 mL/min (ref >60)
GLUCOSE: 106 mg/dL — AB (ref 65–99)
POTASSIUM: 3.7 mmol/L (ref 3.5–5.1)
Sodium: 141 mmol/L (ref 135–145)
TOTAL PROTEIN: 7.5 g/dL (ref 6.5–8.1)

## 2017-05-20 LAB — CBC WITH DIFFERENTIAL/PLATELET
BASOS ABS: 0 10*3/uL (ref 0.0–0.1)
BASOS PCT: 0 %
EOS ABS: 0.1 10*3/uL (ref 0.0–0.7)
Eosinophils Relative: 1 %
HCT: 36 % — ABNORMAL LOW (ref 39.0–52.0)
Hemoglobin: 11.9 g/dL — ABNORMAL LOW (ref 13.0–17.0)
Lymphocytes Relative: 12 %
Lymphs Abs: 0.7 10*3/uL (ref 0.7–4.0)
MCH: 28.6 pg (ref 26.0–34.0)
MCHC: 33.1 g/dL (ref 30.0–36.0)
MCV: 86.5 fL (ref 78.0–100.0)
MONO ABS: 0.4 10*3/uL (ref 0.1–1.0)
MONOS PCT: 8 %
NEUTROS PCT: 79 %
Neutro Abs: 4.2 10*3/uL (ref 1.7–7.7)
PLATELETS: 224 10*3/uL (ref 150–400)
RBC: 4.16 MIL/uL — ABNORMAL LOW (ref 4.22–5.81)
RDW: 12.2 % (ref 11.5–15.5)
WBC: 5.4 10*3/uL (ref 4.0–10.5)

## 2017-05-20 LAB — URINALYSIS, ROUTINE W REFLEX MICROSCOPIC
BILIRUBIN URINE: NEGATIVE
GLUCOSE, UA: NEGATIVE mg/dL
Hgb urine dipstick: NEGATIVE
KETONES UR: NEGATIVE mg/dL
Leukocytes, UA: NEGATIVE
NITRITE: NEGATIVE
PH: 6 (ref 5.0–8.0)
Protein, ur: NEGATIVE mg/dL
SPECIFIC GRAVITY, URINE: 1.017 (ref 1.005–1.030)

## 2017-05-20 LAB — CBC
HCT: 34.3 % — ABNORMAL LOW (ref 39.0–52.0)
HEMOGLOBIN: 11.1 g/dL — AB (ref 13.0–17.0)
MCH: 28.3 pg (ref 26.0–34.0)
MCHC: 32.4 g/dL (ref 30.0–36.0)
MCV: 87.5 fL (ref 78.0–100.0)
Platelets: 218 10*3/uL (ref 150–400)
RBC: 3.92 MIL/uL — AB (ref 4.22–5.81)
RDW: 12.3 % (ref 11.5–15.5)
WBC: 5.6 10*3/uL (ref 4.0–10.5)

## 2017-05-20 LAB — VITAMIN B12: VITAMIN B 12: 641 pg/mL (ref 180–914)

## 2017-05-20 LAB — TROPONIN I

## 2017-05-20 LAB — I-STAT CG4 LACTIC ACID, ED: LACTIC ACID, VENOUS: 0.91 mmol/L (ref 0.5–1.9)

## 2017-05-20 LAB — CREATININE, SERUM
CREATININE: 1.07 mg/dL (ref 0.61–1.24)
GFR calc Af Amer: >60 mL/min (ref >60)
GFR calc non Af Amer: >60 mL/min (ref >60)

## 2017-05-20 MED ORDER — CENTRUM SILVER ADULT 50+ PO TABS: 1.0000 | ORAL_TABLET | Freq: Two times a day (BID) | ORAL | Status: DC

## 2017-05-20 MED ORDER — LORAZEPAM 2 MG/ML IJ SOLN
2.0000 mg | Freq: Once | INTRAMUSCULAR | Status: AC
  Administered 2017-05-21: 2 mg via INTRAVENOUS
  Filled 2017-05-20: qty 1

## 2017-05-20 MED ORDER — ADULT MULTIVITAMIN W/MINERALS CH
1.0000 | ORAL_TABLET | Freq: Every day | ORAL | Status: DC
  Administered 2017-05-21: 1 via ORAL
  Filled 2017-05-20: qty 1

## 2017-05-20 MED ORDER — ACETAMINOPHEN 650 MG RE SUPP: 650.0000 mg | RECTAL | Status: DC | PRN

## 2017-05-20 MED ORDER — SODIUM CHLORIDE 0.9 % IV SOLN
INTRAVENOUS | Status: DC
  Administered 2017-05-20 – 2017-05-21 (×2): via INTRAVENOUS

## 2017-05-20 MED ORDER — STROKE: EARLY STAGES OF RECOVERY BOOK
Freq: Once | Status: AC
  Administered 2017-05-20: 18:00:00
  Filled 2017-05-20: qty 1

## 2017-05-20 MED ORDER — ACETAMINOPHEN 160 MG/5ML PO SOLN: 650.0000 mg | ORAL | Status: DC | PRN

## 2017-05-20 MED ORDER — ENOXAPARIN SODIUM 40 MG/0.4ML ~~LOC~~ SOLN
40.0000 mg | SUBCUTANEOUS | Status: DC
  Administered 2017-05-20 – 2017-05-21 (×2): 40 mg via SUBCUTANEOUS
  Filled 2017-05-20 (×2): qty 0.4

## 2017-05-20 MED ORDER — ACETAMINOPHEN 325 MG PO TABS: 650.0000 mg | ORAL_TABLET | ORAL | Status: DC | PRN

## 2017-05-20 MED ORDER — SENNOSIDES-DOCUSATE SODIUM 8.6-50 MG PO TABS: 1.0000 | ORAL_TABLET | Freq: Every evening | ORAL | Status: DC | PRN

## 2017-05-20 NOTE — ED Notes (Signed)
I-stat lactic performed

## 2017-05-20 NOTE — ED Triage Notes (Addendum)
Wife reports that pt has been falling recently. More falls over the past 2 days  Loss of balance and falls over. States he has poor po intake. Noted to be shuffling his feet. Denies pain. Wife reports started back on antidepressant this week. Medication is seroquel

## 2017-05-20 NOTE — H&P (Signed)
History and Physical    Devin Valdez IRJ:[id number] DOB: 1941-06-10 DOA: 05/20/2017  Referring MD/NP/PA: Francine Graven, EDP PCP: [name], MD  Patient coming from: Home  Chief Complaint: Frequent falls and gait instability  HPI: Devin Valdez is a 76 y.o. male with history of hypertension and depression who comes in today with the above complaints. When they come in to see him in the emergency department he is lying in bed with eyes closed curled up in fetal position. He states he is dizzy when he opens his eyes. Wife states that he was in perfect health until yesterday when he started becoming dizzy and falling. Wife has noted that he seems to be falling more onto his left side. Patient does not describe a sensation of the room spinning around him, what he does describe it as a sensation of lightheadedness and feeling like he is going to pass out when he tries to stand up or get out of a chair. Wife does state that he has had decreased oral intake over the past few weeks, has not been recently ill. In the ED he was noticed to have normal vital signs however he was orthostatic with a blood pressure dropping from 150-120 from lying to standing, labs are within normal limits. CT scan of the head without acute abnormalities, urinalysis and chest x-ray without indication for infection. At this point I feel it is important to rule out a posterior circulation CVA, given lack of availability of MRI at [facility] transfer to [place] has been requested. Of note was started on Seroquel 4 days ago.  Past Medical/Surgical History: Past Medical History:  Diagnosis Date  . Depression   . Hallucinations   . Hypertension     Past Surgical History:  Procedure Laterality Date  . EYE SURGERY      Social History:  reports that he quit smoking about 31 years ago. He has never used smokeless tobacco. He reports that he does not drink alcohol or use drugs.  Allergies: No Known  Allergies  Family History:  No history of cardiovascular disease in family members  Prior to Admission medications   Medication Sig Start Date End Date Taking? Authorizing Provider  losartan-hydrochlorothiazide (HYZAAR) 50-12.5 MG tablet Take 1 tablet by mouth daily. 02/17/17  Yes [provider]  Multiple Vitamins-Minerals (CENTRUM SILVER ADULT 50+) TABS Take 1 capsule by mouth 2 (two) times daily. 01/22/16  Yes Kindl, Nelda Severe, MD  QUEtiapine (SEROQUEL) 50 MG tablet Take 1 tablet by mouth 3 (three) times daily. 03/07/17  Yes [provider]    Review of Systems:  Constitutional: Denies fever, chills, diaphoresis, appetite change and fatigue.  HEENT: Denies photophobia, eye pain, redness, hearing loss, ear pain, congestion, sore throat, rhinorrhea, sneezing, mouth sores, trouble swallowing, neck pain, neck stiffness and tinnitus.   Respiratory: Denies SOB, DOE, cough, chest tightness,  and wheezing.   Cardiovascular: Denies chest pain, palpitations and leg swelling.  Gastrointestinal: Denies nausea, vomiting, abdominal pain, diarrhea, constipation, blood in stool and abdominal distention.  Genitourinary: Denies dysuria, urgency, frequency, hematuria, flank pain and difficulty urinating.  Endocrine: Denies: hot or cold intolerance, sweats, changes in hair or nails, polyuria, polydipsia. Musculoskeletal: Denies myalgias, back pain, joint swelling, arthralgias and gait problem.  Skin: Denies pallor, rash and wound.  Neurological: Deniesseizures, syncope,  numbness and headaches.  Hematological: Denies adenopathy. Easy bruising, personal or family bleeding history  Psychiatric/Behavioral: Denies suicidal ideation, mood changes, confusion, nervousness, sleep disturbance and agitation  Physical Exam: Vitals:   05/20/17 1345 05/20/17 1400 05/20/17 1500 05/20/17 1530  BP:  137/67 139/66 (!) 145/71  Pulse: 69 68 65 65  Resp: (!) 21 17 20 19   Temp:    98.3 F (36.8 C)   TempSrc:      SpO2: 99% 100% 99% 99%  Weight:      Height:         Constitutional: NAD, calm, comfortable,Lying in bed curled up in fetal position Eyes: PERRL, lids and conjunctivae normal, no nystagmus identified on exam ENMT: Mucous membranes are moist. Posterior pharynx clear of any exudate or lesions.Normal dentition.  Neck: normal, supple, no masses, no thyromegaly Respiratory: clear to auscultation bilaterally, no wheezing, no crackles. Normal respiratory effort. No accessory muscle use.  Cardiovascular: Regular rate and rhythm, no murmurs / rubs / gallops. No extremity edema. 2+ pedal pulses. No carotid bruits.  Abdomen: no tenderness, no masses palpated. No hepatosplenomegaly. Bowel sounds positive.  Musculoskeletal: no clubbing / cyanosis. No joint deformity upper and lower extremities. Good ROM, no contractures. Normal muscle tone.  Skin: no rashes, lesions, ulcers. No induration Neurologic: He is generally weak but neurologically intact  Psychiatric: Normal judgment and insight. Alert and oriented x 3. Normal mood.    Labs on Admission: I have personally reviewed the following labs and imaging studies  CBC:  Recent Labs Lab 05/20/17 0918 05/20/17 1702  WBC 5.4 5.6  NEUTROABS 4.2  --   HGB 11.9* 11.1*  HCT 36.0* 34.3*  MCV 86.5 87.5  PLT 224 202   Basic Metabolic Panel:  Recent Labs Lab 05/20/17 0918  NA 141  K 3.7  CL 102  CO2 29  GLUCOSE 106*  BUN 25*  CREATININE 1.08  CALCIUM 10.1   GFR: Estimated Creatinine Clearance: 49.3 mL/min (by C-G formula based on SCr of 1.08 mg/dL). Liver Function Tests:  Recent Labs Lab 05/20/17 0918  AST 16  ALT 17  ALKPHOS 48  BILITOT 0.9  PROT 7.5  ALBUMIN 4.4   No results for input(s): LIPASE, AMYLASE in the last 168 hours. No results for input(s): AMMONIA in the last 168 hours. Coagulation Profile: No results for input(s): INR, PROTIME in the last 168 hours. Cardiac Enzymes:  Recent Labs Lab  05/20/17 0918  TROPONINI <0.03   BNP (last 3 results) No results for input(s): PROBNP in the last 8760 hours. HbA1C: No results for input(s): HGBA1C in the last 72 hours. CBG: No results for input(s): GLUCAP in the last 168 hours. Lipid Profile: No results for input(s): CHOL, HDL, LDLCALC, TRIG, CHOLHDL, LDLDIRECT in the last 72 hours. Thyroid Function Tests: No results for input(s): TSH, T4TOTAL, FREET4, T3FREE, THYROIDAB in the last 72 hours. Anemia Panel: No results for input(s): VITAMINB12, FOLATE, FERRITIN, TIBC, IRON, RETICCTPCT in the last 72 hours. Urine analysis:    Component Value Date/Time   COLORURINE YELLOW 05/20/2017 0918   APPEARANCEUR CLEAR 05/20/2017 0918   LABSPEC 1.017 05/20/2017 0918   PHURINE 6.0 05/20/2017 0918   GLUCOSEU NEGATIVE 05/20/2017 0918   HGBUR NEGATIVE 05/20/2017 0918   BILIRUBINUR NEGATIVE 05/20/2017 0918   KETONESUR NEGATIVE 05/20/2017 0918   PROTEINUR NEGATIVE 05/20/2017 0918   UROBILINOGEN 0.2 05/14/2017 1256   NITRITE NEGATIVE 05/20/2017 0918   LEUKOCYTESUR NEGATIVE 05/20/2017 0918   Sepsis Labs: @LABRCNTIP (procalcitonin:4,lacticidven:4) )No results found for this or any previous visit (from the past 240 hour(s)).   Radiological Exams on Admission: Dg Chest 2 View  Result Date: 05/20/2017 CLINICAL DATA:  Unsteady gait today.  Weakness. EXAM: CHEST  2 VIEW COMPARISON:  01/22/2016 FINDINGS: Cardiac silhouette is normal in size. No mediastinal or hilar masses. No evidence of adenopathy. Clear lungs.  No pleural effusion or pneumothorax. Skeletal structures are intact. IMPRESSION: No active cardiopulmonary disease. Electronically Signed   By: Lajean Manes M.D.   On: 05/20/2017 09:42   Ct Head Wo Contrast  Result Date: 05/20/2017 CLINICAL DATA:  Wife reports that pt has been falling recently. More falls over the past 2 days Loss of balance and falls over. States he has poor po intake. Noted to be shuffling his feet. Denies pain. Wife  reports started back on antidepressant this week. EXAM: CT HEAD WITHOUT CONTRAST TECHNIQUE: Contiguous axial images were obtained from the base of the skull through the vertex without intravenous contrast. COMPARISON:  02/27/2017 FINDINGS: Brain: No evidence of acute infarction, hemorrhage, hydrocephalus, extra-axial collection or mass lesion/mass effect. There is age appropriate ventricular and sulcal enlargement stable from the prior study. Vascular: No hyperdense vessel or unexpected calcification. Skull: Normal. Negative for fracture or focal lesion. Sinuses/Orbits: Globes and orbits are unremarkable. Visualized sinuses and mastoid air cells are clear. Other: None. IMPRESSION: 1. Normal unenhanced CT scan of the brain for age. No change from prior study. Electronically Signed   By: Lajean Manes M.D.   On: 05/20/2017 09:36    EKG: Independently reviewed. Normal sinus rhythm, no acute ischemic changes  Assessment/Plan Principal Problem:   Gait instability Active Problems:   Frequent falls   Depression    Gait instability/frequent falls -CT scan of the head without evidence for CVA or normal pressure hydrocephalus. -Given onset less than 24 hours, gait instability and dizziness I feel it is important to rule out a posterior circulation CVA. For this we will need an MRI which is not available at [facility] over the weekend hence transfer to [facility] has been requested. -We'll also check a vitamin B-12 level as this may be contributing to falls. [facility] request PT evaluation. If MRI is negative may benefit from vestibular PT and consideration to BPPV. -Continue aspirin for now. -I suppose his gait instability could also be due to orthostasis, however his blood pressure is normal despite the orthostasis. -We'll hold BP meds for now. -As he was recently started on Seroquel 4 days ago, will stop this medication as it may also be contributing. -Typical stroke workup including echo, Dopplers will  be requested.   DVT prophylaxis: Lovenox  Code Status: Full code  Family Communication: Wife at bedside updated on plan of care and questions answered  Disposition Plan: Transfer to [place] called: None  Admission status: Observation    Time Spent: 75 minutes  Lelon Frohlich MD [facility] Pager [phone]  If 7PM-7AM, please contact night-coverage [email or web address] Password [email or web address]  05/20/2017, 5:39 PM

## 2017-05-20 NOTE — ED Notes (Signed)
Report given to [facility].

## 2017-05-20 NOTE — ED Provider Notes (Signed)
[facility] DEPT Provider Note   CSN: [id number] Arrival date & time: 05/20/17  0846     History   Chief Complaint Chief Complaint  Patient presents with  . Weakness  . Fall    HPI Devin Valdez is a 76 y.o. male.  HPI  Pt was seen at 0855. Per pt ad his family, c/o gradual onset and worsening of persistent "unsteady gait" while walking for the past 2 days. Family states pt has been "stumbling" and "falling over." Pt states he feels "lightheaded." Has been associated with poor PO intake. Pt also has started seroquel this past week. Family states pt was seen at [facility] 1 week ago for c/o urinary frequency and fatigue for the past 2 to 3 weeks. Pt did not have UTI at that time. Denies fevers, no N/V/D, no abd pain, no CP/SOB, no cough, no focal motor weakness, no tingling/numbness in extremities.    Past Medical History:  Diagnosis Date  . Depression   . Hallucinations   . Hypertension     There are no active problems to display for this patient.   Past Surgical History:  Procedure Laterality Date  . EYE SURGERY         Home Medications    Prior to Admission medications   Medication Sig Start Date End Date Taking? Authorizing Provider  losartan-hydrochlorothiazide (HYZAAR) 50-12.5 MG tablet Take 1 tablet by mouth daily. 02/17/17   [provider]  Multiple Vitamins-Minerals (CENTRUM SILVER ADULT 50+) TABS Take 1 capsule by mouth 2 (two) times daily. 01/22/16   Billy Fischer, MD    Family History No family history on file.  Social History Social History  Substance Use Topics  . Smoking status: Former Smoker    Quit date: 11/21/1985  . Smokeless tobacco: Never Used  . Alcohol use No     Allergies   Patient has no known allergies.   Review of Systems Review of Systems ROS: Statement: All systems negative except as marked or noted in the HPI; Constitutional: Negative for fever and chills. ; ; Eyes: Negative for eye pain, redness and discharge. ;  ; ENMT: Negative for ear pain, hoarseness, nasal congestion, sinus pressure and sore throat. ; ; Cardiovascular: Negative for chest pain, palpitations, diaphoresis, dyspnea and peripheral edema. ; ; Respiratory: Negative for cough, wheezing and stridor. ; ; Gastrointestinal: +poor PO intake. Negative for nausea, vomiting, diarrhea, abdominal pain, blood in stool, hematemesis, jaundice and rectal bleeding. . ; ; Genitourinary: +urinary frequency. Negative for dysuria, flank pain and hematuria. ; ; Musculoskeletal: Negative for back pain and neck pain. Negative for swelling and trauma.; ; Skin: Negative for pruritus, rash, abrasions, blisters, bruising and skin lesion.; ; Neuro: +unsteady gait, lightheadedness. Negative for headache and neck stiffness. Negative for weakness, altered level of consciousness, altered mental status, extremity weakness, paresthesias, involuntary movement, seizure and syncope.       Physical Exam Updated Vital Signs BP (!) 152/73 (BP Location: Left Arm)   Pulse 67   Temp 98.3 F (36.8 C) (Oral)   Resp 16   Ht 5\' 7"  (1.702 m)   Wt 59 kg (130 lb)   SpO2 98%   BMI 20.36 kg/m    09:52 Orthostatic Vital Signs CS  Orthostatic Lying   BP- Lying: 146/67  Pulse- Lying: 65      Orthostatic Sitting  BP- Sitting: 144/76  Pulse- Sitting: 64  09:55:02 ED Notes CS  Pt to weak to stand during orthostatics  11:08:37 Orthostatic Vital Signs JS  Orthostatic Lying   BP- Lying: 151/74  Pulse- Lying: 65      Orthostatic Sitting  BP- Sitting: 143/73  Pulse- Sitting: 66      Orthostatic Standing at 0 minutes  BP- Standing at 0 minutes: 127/66  Pulse- Standing at 0 minutes: 76     Physical Exam 0900: Physical examination:  Nursing notes reviewed; Vital signs and O2 SAT reviewed;  Constitutional: Well developed, Well nourished, Well hydrated, In no acute distress; Head:  Normocephalic, atraumatic; Eyes: EOMI, PERRL, No scleral icterus; ENMT: Mouth and pharynx  normal, Mucous membranes moist; Neck: Supple, Full range of motion, No lymphadenopathy; Cardiovascular: Regular rate and rhythm, No gallop; Respiratory: Breath sounds clear & equal bilaterally, No wheezes.  Speaking full sentences with ease, Normal respiratory effort/excursion; Chest: Nontender, Movement normal; Abdomen: Soft, Nontender, Nondistended, Normal bowel sounds; Genitourinary: No CVA tenderness; Extremities: Pulses normal, No tenderness, No edema, No calf edema or asymmetry.; Neuro: AA&Ox3, Major CN grossly intact. Speech clear.  No facial droop.  No nystagmus. Grips equal. Strength 5/5 equal bilat UE's and LE's.  DTR 2/4 equal bilat UE's and LE's.  No gross sensory deficits.  Normal cerebellar testing bilat UE's (finger-nose) and LE's (heel-shin)..; Skin: Color normal, Warm, Dry.   ED Treatments / Results  Labs (all labs ordered are listed, but only abnormal results are displayed)   EKG  EKG Interpretation  Date/Time:  Saturday May 20 2017 09:05:06 EDT Ventricular Rate:  68 PR Interval:    QRS Duration: 78 QT Interval:  386 QTC Calculation: 411 R Axis:   68 Text Interpretation:  Sinus rhythm Probable anteroseptal infarct, old Baseline wander Artifact When compared with ECG of 02/27/2017 No significant change was found Confirmed by [facility]  MD, Nunzio Cory [phone]) on 05/20/2017 9:13:37 AM       Radiology   Procedures Procedures (including critical care time)  Medications Ordered in ED Medications - No data to display   Initial Impression / Assessment and Plan / ED Course  I have reviewed the triage vital signs and the nursing notes.  Pertinent labs & imaging results that were available during my care of the patient were reviewed by me and considered in my medical decision making (see chart for details).  MDM Reviewed: previous chart, nursing note and vitals Reviewed previous: labs, ECG and CT scan Interpretation: labs, ECG, x-ray and CT scan   Results for orders  placed or performed during the hospital encounter of 05/20/17  Urinalysis, Routine w reflex microscopic  Result Value Ref Range   Color, Urine YELLOW YELLOW   APPearance CLEAR CLEAR   Specific Gravity, Urine 1.017 1.005 - 1.030   pH 6.0 5.0 - 8.0   Glucose, UA NEGATIVE NEGATIVE mg/dL   Hgb urine dipstick NEGATIVE NEGATIVE   Bilirubin Urine NEGATIVE NEGATIVE   Ketones, ur NEGATIVE NEGATIVE mg/dL   Protein, ur NEGATIVE NEGATIVE mg/dL   Nitrite NEGATIVE NEGATIVE   Leukocytes, UA NEGATIVE NEGATIVE  Comprehensive metabolic panel  Result Value Ref Range   Sodium 141 135 - 145 mmol/L   Potassium 3.7 3.5 - 5.1 mmol/L   Chloride 102 101 - 111 mmol/L   CO2 29 22 - 32 mmol/L   Glucose, Bld 106 (H) 65 - 99 mg/dL   BUN 25 (H) 6 - 20 mg/dL   Creatinine, Ser 1.08 0.61 - 1.24 mg/dL   Calcium 10.1 8.9 - 10.3 mg/dL   Total Protein 7.5 6.5 - 8.1 g/dL   Albumin 4.4  3.5 - 5.0 g/dL   AST 16 15 - 41 U/L   ALT 17 17 - 63 U/L   Alkaline Phosphatase 48 38 - 126 U/L   Total Bilirubin 0.9 0.3 - 1.2 mg/dL   GFR calc non Af Amer >60 >60 mL/min   GFR calc Af Amer >60 >60 mL/min   Anion gap 10 5 - 15  Troponin I  Result Value Ref Range   Troponin I <0.03 <0.03 ng/mL  CBC with Differential  Result Value Ref Range   WBC 5.4 4.0 - 10.5 K/uL   RBC 4.16 (L) 4.22 - 5.81 MIL/uL   Hemoglobin 11.9 (L) 13.0 - 17.0 g/dL   HCT 36.0 (L) 39.0 - 52.0 %   MCV 86.5 78.0 - 100.0 fL   MCH 28.6 26.0 - 34.0 pg   MCHC 33.1 30.0 - 36.0 g/dL   RDW 12.2 11.5 - 15.5 %   Platelets 224 150 - 400 K/uL   Neutrophils Relative % 79 %   Neutro Abs 4.2 1.7 - 7.7 K/uL   Lymphocytes Relative 12 %   Lymphs Abs 0.7 0.7 - 4.0 K/uL   Monocytes Relative 8 %   Monocytes Absolute 0.4 0.1 - 1.0 K/uL   Eosinophils Relative 1 %   Eosinophils Absolute 0.1 0.0 - 0.7 K/uL   Basophils Relative 0 %   Basophils Absolute 0.0 0.0 - 0.1 K/uL  I-Stat CG4 Lactic Acid, ED  Result Value Ref Range   Lactic Acid, Venous 0.91 0.5 - 1.9 mmol/L   Dg  Chest 2 View Result Date: 05/20/2017 CLINICAL DATA:  Unsteady gait today.  Weakness. EXAM: CHEST  2 VIEW COMPARISON:  01/22/2016 FINDINGS: Cardiac silhouette is normal in size. No mediastinal or hilar masses. No evidence of adenopathy. Clear lungs.  No pleural effusion or pneumothorax. Skeletal structures are intact. IMPRESSION: No active cardiopulmonary disease. Electronically Signed   By: Lajean Manes M.D.   On: 05/20/2017 09:42   Ct Head Wo Contrast Result Date: 05/20/2017 CLINICAL DATA:  Wife reports that pt has been falling recently. More falls over the past 2 days Loss of balance and falls over. States he has poor po intake. Noted to be shuffling his feet. Denies pain. Wife reports started back on antidepressant this week. EXAM: CT HEAD WITHOUT CONTRAST TECHNIQUE: Contiguous axial images were obtained from the base of the skull through the vertex without intravenous contrast. COMPARISON:  02/27/2017 FINDINGS: Brain: No evidence of acute infarction, hemorrhage, hydrocephalus, extra-axial collection or mass lesion/mass effect. There is age appropriate ventricular and sulcal enlargement stable from the prior study. Vascular: No hyperdense vessel or unexpected calcification. Skull: Normal. Negative for fracture or focal lesion. Sinuses/Orbits: Globes and orbits are unremarkable. Visualized sinuses and mastoid air cells are clear. Other: None. IMPRESSION: 1. Normal unenhanced CT scan of the brain for age. No change from prior study. Electronically Signed   By: Lajean Manes M.D.   On: 05/20/2017 09:36    1140:  H/H per baseline. Unable to stand for orthostatics without heavy assist; pt very unsteady on feet. Question stroke vs medication side effect. Will need MRI, admit. Dx and testing d/w pt and family.  Questions answered.  Verb understanding, agreeable to admit.   T/C to [facility] Dr. Jerilee Hoh, case discussed, including:  HPI, pertinent PM/SHx, VS/PE, dx testing, ED course and treatment:  Agreeable to  admit .     Final Clinical Impressions(s) / ED Diagnoses   Final diagnoses:  None    New Prescriptions New  Prescriptions   No medications on file     Francine Graven, DO 05/21/17 7741

## 2017-05-20 NOTE — Progress Notes (Signed)
Devin Valdez is a 76 y.o. male patient admitted from ED awake, alert - oriented  X 4 - no acute distress noted.  VSS - Blood pressure (!) 145/71, pulse 65, temperature 98.3 F (36.8 C), resp. rate 19, height 5\' 7"  (1.702 m), weight 59 kg (130 lb), SpO2 99 %.    IV in place, occlusive dsg intact without redness.  Orientation to room, and floor completed with information packet given to patient/family.  Patient declined safety video at this time.  Admission INP armband ID verified with patient/family, and in place.   SR up x 2, fall assessment complete, with patient and family able to verbalize understanding of risk associated with falls, and verbalized understanding to call nsg before up out of bed.  Call light within reach, patient able to voice, and demonstrate understanding.  Skin, clean-dry- intact without evidence of bruising, or skin tears.   No evidence of skin break down noted on exam.    Will cont to eval and treat per MD orders.  Celine Ahr, RN 05/20/2017 6:02 PM

## 2017-05-20 NOTE — ED Notes (Signed)
Pt to weak to stand during orthostatics

## 2017-05-21 ENCOUNTER — Observation Stay (HOSPITAL_COMMUNITY): Payer: Medicare Other

## 2017-05-21 ENCOUNTER — Observation Stay (HOSPITAL_BASED_OUTPATIENT_CLINIC_OR_DEPARTMENT_OTHER): Payer: Medicare Other

## 2017-05-21 DIAGNOSIS — I6789 Other cerebrovascular disease: Secondary | ICD-10-CM

## 2017-05-21 DIAGNOSIS — D649 Anemia, unspecified: Secondary | ICD-10-CM

## 2017-05-21 DIAGNOSIS — R2681 Unsteadiness on feet: Secondary | ICD-10-CM

## 2017-05-21 DIAGNOSIS — F329 Major depressive disorder, single episode, unspecified: Secondary | ICD-10-CM | POA: Diagnosis not present

## 2017-05-21 DIAGNOSIS — R2689 Other abnormalities of gait and mobility: Secondary | ICD-10-CM | POA: Diagnosis not present

## 2017-05-21 DIAGNOSIS — R296 Repeated falls: Secondary | ICD-10-CM | POA: Diagnosis not present

## 2017-05-21 LAB — LIPID PANEL
CHOL/HDL RATIO: 3.1 ratio
CHOLESTEROL: 163 mg/dL (ref 0–200)
HDL: 52 mg/dL (ref >40)
LDL CALC: 97 mg/dL (ref 0–99)
TRIGLYCERIDES: 71 mg/dL (ref <150)
VLDL: 14 mg/dL (ref 0–40)

## 2017-05-21 LAB — TSH: TSH: 1.696 u[IU]/mL (ref 0.350–4.500)

## 2017-05-21 LAB — ECHOCARDIOGRAM COMPLETE
Height: 67 in
Weight: 2038.81 oz

## 2017-05-21 MED ORDER — ESCITALOPRAM OXALATE 10 MG PO TABS: 10.0000 mg | ORAL_TABLET | Freq: Every day | ORAL | Status: DC

## 2017-05-21 MED ORDER — HYDROCHLOROTHIAZIDE 12.5 MG PO CAPS
12.5000 mg | ORAL_CAPSULE | Freq: Every day | ORAL | Status: DC
  Administered 2017-05-21: 12.5 mg via ORAL
  Filled 2017-05-21: qty 1

## 2017-05-21 MED ORDER — LOSARTAN POTASSIUM-HCTZ 50-12.5 MG PO TABS: 1.0000 | ORAL_TABLET | Freq: Every day | ORAL | Status: DC

## 2017-05-21 MED ORDER — LOSARTAN POTASSIUM 50 MG PO TABS
50.0000 mg | ORAL_TABLET | Freq: Every day | ORAL | Status: DC
  Administered 2017-05-21: 50 mg via ORAL
  Filled 2017-05-21: qty 1

## 2017-05-21 MED ORDER — QUETIAPINE FUMARATE 25 MG PO TABS
50.0000 mg | ORAL_TABLET | Freq: Three times a day (TID) | ORAL | Status: DC
  Filled 2017-05-21: qty 2

## 2017-05-21 NOTE — Discharge Summary (Addendum)
Physician Discharge Summary  Devin Valdez:[id number] DOB: 05/15/1941 DOA: 05/20/2017  PCP: [name], MD  Admit date: 05/20/2017 Discharge date: 05/21/2017  Time spent: 45 minutes  Recommendations for Outpatient Follow-up:  Patient will be discharged to home.  Patient will need to follow up with primary care provider within one week of discharge.  Patient should continue medications as prescribed.  Patient should follow a heart healthy diet.   Discharge Diagnoses:  Gait instability/frequent falls Essential hypertension Depression Poor oral intake/weight loss  Discharge Condition: Stable  Diet recommendation: heart healthy  Filed Weights   05/20/17 0858 05/20/17 1730  Weight: 59 kg (130 lb) 57.8 kg (127 lb 6.8 oz)    History of present illness:  on 05/20/2017 by Dr. Rande Lawman Knight P [name] a 76 y.o.malewith history of hypertension and depression who comes in today with the above complaints. When they come in to see him in the emergency department he is lying in bed with eyes closed curled up in fetal position. He states he is dizzy when he opens his eyes. Wife states that he was in perfect health until yesterday when he started becoming dizzy and falling. Wife has noted that he seems to be falling more onto his left side. Patient does not describe a sensation of the room spinning around him, whathe does describe it as a sensation of lightheadedness and feeling like he is going to pass out when he tries to stand up or get out of a chair. Wife does state that he has had decreased oral intake over the past few weeks, has not been recently ill. In the ED he was noticed to have normal vital signs however he was orthostatic with a blood pressure dropping from 150-120 from lying to standing, labs are within normal limits. CT scan of the head without acute abnormalities, urinalysis and chest x-ray without indication for infection. At this point I feel it is important to  rule out a posterior circulation CVA, given lack of availability of MRI at [facility] transfer to [facility] has been requested. Of note was started on Seroquel 4 days ago.  Hospital Course:  Gait instability/frequent falls -CT head: Normal unenhanced CT scan -Patient transferred to [facility] for MRI to rule out posterior circulation CVA -MRI brain: No acute finding by MRI -MRA head: Negative intracranial MR angiography of large and medium size vessels -Echocardiogram: EF 24%, normal diastolic function, No PFO or ASD, mild left atrial enlargement, trivial TR/MR -Carotid doppler: 1-39% ICA stenosis, vertebral artery flow is antegrade -Continue aspirin -Vitamin B-12 641 -TSH 1.696 -Currently no source of infection, UA and chest x-ray unremarkable -Suspect secondary to poor oral intake secondary to depression  Essential hypertension -Losartan/HCTZ initially held- given that MRI was negative for CVA, continue upon discharge  Depression -Patient no longer on seroquel, now on Lexapro 10mg  daily, continue  -Patient lost his sister in Feb 2018. Suspect depression has led to his poor oral intake.   Poor oral intake/weight loss -Recommended patient use protein shakes to supplement his calories -Follow up PCP  Procedures: Echocardiogram Carotid doppler  Consultations: None  Discharge Exam: Vitals:   05/21/17 0640 05/21/17 1421  BP: (!) 153/61 (!) 147/67  Pulse: 67 76  Resp: 18 20  Temp: 97.5 F (36.4 C) 98.8 F (37.1 C)   Admits to feeling depressed and not eating well. Denies chest pain, shortness of breath, abdominal pain, nausea, vomiting, diarrhea, constipation, dizziness, headache.    General: Well developed, thin, NAD  HEENT: NCAT, mucous membranes moist.  Cardiovascular: S1 S2 auscultated, no rubs, murmurs or gallops. Regular rate and rhythm.  Respiratory: Clear to auscultation bilaterally with equal chest rise  Abdomen: Soft, nontender, nondistended, + bowel  sounds  Extremities: warm dry without cyanosis clubbing or edema  Neuro: AAOx3, nonfocal  Psych: Depressed, however appropriate and pleasant  Discharge Instructions Discharge Instructions    Discharge instructions    Complete by:  As directed    Patient will be discharged to home.  Patient will need to follow up with primary care provider within one week of discharge.  Patient should continue medications as prescribed.  Patient should follow a heart healthy diet.     Current Discharge Medication List    START taking these medications   Details  escitalopram (LEXAPRO) 10 MG tablet Take 1 tablet (10 mg total) by mouth daily.      CONTINUE these medications which have NOT CHANGED   Details  losartan-hydrochlorothiazide (HYZAAR) 50-12.5 MG tablet Take 1 tablet by mouth daily.    Multiple Vitamins-Minerals (CENTRUM SILVER ADULT 50+) TABS Take 1 capsule by mouth 2 (two) times daily. Qty: 30 tablet, Refills: 2      STOP taking these medications     QUEtiapine (SEROQUEL) 50 MG tablet        No Known Allergies Follow-up Information    [name], MD. Schedule an appointment as soon as possible for a visit in 1 week(s).   Specialty:  Family Medicine Why:  Hospital Follow up Contact information: [address] [place] [address] [phone]            The results of significant diagnostics from this hospitalization (including imaging, microbiology, ancillary and laboratory) are listed below for reference.    Significant Diagnostic Studies: Dg Chest 2 View  Result Date: 05/20/2017 CLINICAL DATA:  Unsteady gait today.  Weakness. EXAM: CHEST  2 VIEW COMPARISON:  01/22/2016 FINDINGS: Cardiac silhouette is normal in size. No mediastinal or hilar masses. No evidence of adenopathy. Clear lungs.  No pleural effusion or pneumothorax. Skeletal structures are intact. IMPRESSION: No active cardiopulmonary disease. Electronically Signed   By: Lajean Manes M.D.   On:  05/20/2017 09:42   Ct Head Wo Contrast  Result Date: 05/20/2017 CLINICAL DATA:  Wife reports that pt has been falling recently. More falls over the past 2 days Loss of balance and falls over. States he has poor po intake. Noted to be shuffling his feet. Denies pain. Wife reports started back on antidepressant this week. EXAM: CT HEAD WITHOUT CONTRAST TECHNIQUE: Contiguous axial images were obtained from the base of the skull through the vertex without intravenous contrast. COMPARISON:  02/27/2017 FINDINGS: Brain: No evidence of acute infarction, hemorrhage, hydrocephalus, extra-axial collection or mass lesion/mass effect. There is age appropriate ventricular and sulcal enlargement stable from the prior study. Vascular: No hyperdense vessel or unexpected calcification. Skull: Normal. Negative for fracture or focal lesion. Sinuses/Orbits: Globes and orbits are unremarkable. Visualized sinuses and mastoid air cells are clear. Other: None. IMPRESSION: 1. Normal unenhanced CT scan of the brain for age. No change from prior study. Electronically Signed   By: Lajean Manes M.D.   On: 05/20/2017 09:36   Mr Brain Wo Contrast  Result Date: 05/21/2017 CLINICAL DATA:  Frequent falls with gait instability and dizziness. Symptoms began acutely yesterday. EXAM: MRI HEAD WITHOUT CONTRAST MRA HEAD WITHOUT CONTRAST TECHNIQUE: Multiplanar, multiecho pulse sequences of the brain and surrounding structures were obtained without intravenous contrast.  Angiographic images of the head were obtained using MRA technique without contrast. COMPARISON:  CT 05/20/2017 and 02/27/2017 FINDINGS: MRI HEAD FINDINGS Brain: Diffusion imaging does not show any acute or subacute infarction. Mild chronic small-vessel ischemic changes are present affecting the pons in the cerebral hemispheric deep and subcortical white matter. No cortical or large vessel territory infarction. No mass lesion, hemorrhage, hydrocephalus or extra-axial collection.  Vascular: Major vessels at the base of the brain show flow. Skull and upper cervical spine: Negative Sinuses/Orbits: Clear/normal Other: None significant MRA HEAD FINDINGS Both internal carotid arteries are patent through the skullbase and siphon regions. There is motion degradation in the siphon region. Anterior and middle cerebral vessels are patent without proximal stenosis, aneurysm or vascular malformation. Both vertebral arteries are patent to the basilar. No basilar stenosis. Posterior circulation branch vessels appear normal. IMPRESSION: No acute finding by MRI. 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 Negative intracranial MR angiography of the large and medium size vessels. Some motion degradation. Electronically Signed   By: Nelson Chimes M.D.   On: 05/21/2017 09:20   Mr Jodene Nam Head/brain HO Cm  Result Date: 05/21/2017 CLINICAL DATA:  Frequent falls with gait instability and dizziness. Symptoms began acutely yesterday. EXAM: MRI HEAD WITHOUT CONTRAST MRA HEAD WITHOUT CONTRAST TECHNIQUE: Multiplanar, multiecho pulse sequences of the brain and surrounding structures were obtained without intravenous contrast. Angiographic images of the head were obtained using MRA technique without contrast. COMPARISON:  CT 05/20/2017 and 02/27/2017 FINDINGS: MRI HEAD FINDINGS Brain: Diffusion imaging does not show any acute or subacute infarction. Mild chronic small-vessel ischemic changes are present affecting the pons in the cerebral hemispheric deep and subcortical white matter. No cortical or large vessel territory infarction. No mass lesion, hemorrhage, hydrocephalus or extra-axial collection. Vascular: Major vessels at the base of the brain show flow. Skull and upper cervical spine: Negative Sinuses/Orbits: Clear/normal Other: None significant MRA HEAD FINDINGS Both internal carotid arteries are patent through the skullbase and siphon regions. There is motion  degradation in the siphon region. Anterior and middle cerebral vessels are patent without proximal stenosis, aneurysm or vascular malformation. Both vertebral arteries are patent to the basilar. No basilar stenosis. Posterior circulation branch vessels appear normal. IMPRESSION: No acute finding by MRI. 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 Negative intracranial MR angiography of the large and medium size vessels. Some motion degradation. Electronically Signed   By: Nelson Chimes M.D.   On: 05/21/2017 09:20    Microbiology: No results found for this or any previous visit (from the past 240 hour(s)).   Labs: Basic Metabolic Panel:  Recent Labs Lab 05/20/17 0918 05/20/17 1702  NA 141  --   K 3.7  --   CL 102  --   CO2 29  --   GLUCOSE 106*  --   BUN 25*  --   CREATININE 1.08 1.07  CALCIUM 10.1  --    Liver Function Tests:  Recent Labs Lab 05/20/17 0918  AST 16  ALT 17  ALKPHOS 48  BILITOT 0.9  PROT 7.5  ALBUMIN 4.4   No results for input(s): LIPASE, AMYLASE in the last 168 hours. No results for input(s): AMMONIA in the last 168 hours. CBC:  Recent Labs Lab 05/20/17 0918 05/20/17 1702  WBC 5.4 5.6  NEUTROABS 4.2  --   HGB 11.9* 11.1*  HCT 36.0* 34.3*  MCV 86.5 87.5  PLT 224 218   Cardiac  Enzymes:  Recent Labs Lab 05/20/17 0918  TROPONINI <0.03   BNP: BNP (last 3 results) No results for input(s): BNP in the last 8760 hours.  ProBNP (last 3 results) No results for input(s): PROBNP in the last 8760 hours.  CBG: No results for input(s): GLUCAP in the last 168 hours.     SignedCristal Ford  [facility] 05/21/2017, 5:02 PM

## 2017-05-21 NOTE — Progress Notes (Signed)
PROGRESS NOTE    Devin Valdez  NFA:[id number] DOB: 28-May-1941 DOA: 05/20/2017 PCP: [name], MD   Chief Complaint  Patient presents with  . Weakness  . Fall     Brief Narrative:  HPI on 05/20/2017 by Dr. Rande Lawman Devin Valdez is a 76 y.o. male with history of hypertension and depression who comes in today with the above complaints. When they come in to see him in the emergency department he is lying in bed with eyes closed curled up in fetal position. He states he is dizzy when he opens his eyes. Wife states that he was in perfect health until yesterday when he started becoming dizzy and falling. Wife has noted that he seems to be falling more onto his left side. Patient does not describe a sensation of the room spinning around him, what he does describe it as a sensation of lightheadedness and feeling like he is going to pass out when he tries to stand up or get out of a chair. Wife does state that he has had decreased oral intake over the past few weeks, has not been recently ill. In the ED he was noticed to have normal vital signs however he was orthostatic with a blood pressure dropping from 150-120 from lying to standing, labs are within normal limits. CT scan of the head without acute abnormalities, urinalysis and chest x-ray without indication for infection. At this point I feel it is important to rule out a posterior circulation CVA, given lack of availability of MRI at [facility] transfer to [place] has been requested. Of note was started on Seroquel 4 days ago. Assessment & Plan   Gait instability/frequent falls -CT head: Normal unenhanced CT scan -Patient transferred to [facility] for MRI to rule out posterior circulation CVA -MRI brain: No acute finding by MRI -MRA head: Negative intracranial MR angiography of large and medium size vessels -Pending echocardiogram, carotid Doppler -Pending PT and OT evaluations -Continue aspirin -Vitamin B-12  641 -Currently no source of infection, UA and chest x-ray unremarkable -Suspect secondary to poor oral intake secondary to depression  Essential hypertension -Losartan/HCTZ held- given that MRI was negative for CVA, will restart  Depression -Seroquel held, however will restart -Patient lost his sister in Feb 2018. Suspect depression has led to his poor oral intake.   Poor oral intake/weight loos -will consult nutrition   DVT Prophylaxis  lovenox  Code Status: Full  Family Communication: Wife at bedside  Disposition Plan: Observation. Pending workup and PT/OT evaluations  Consultants None  Procedures  None  Antibiotics   Anti-infectives    None      Subjective:   Devin Valdez seen and examined today.  *Continues to feel weak. Denies chest pain, shortness of breath, abdominal pain, nausea, vomiting, diarrhea, constipation, dizziness, headache, cough, night sweats.   Objective:   Vitals:   05/21/17 0019 05/21/17 0240 05/21/17 0439 05/21/17 0640  BP: 138/65 (!) 147/66 (!) 143/74 (!) 153/61  Pulse: 65 68 64 67  Resp: 18 18 20 18   Temp: 98.5 F (36.9 C) 98.2 F (36.8 C) 98 F (36.7 C) 97.5 F (36.4 C)  TempSrc: Oral Oral Oral   SpO2: 98% 100% 100% 100%  Weight:      Height:        Intake/Output Summary (Last 24 hours) at 05/21/17 1147 Last data filed at 05/21/17 1141  Gross per 24 hour  Intake           231.25 ml  Output              750 ml  Net          -518.75 ml   Filed Weights   05/20/17 0858 05/20/17 1730  Weight: 59 kg (130 lb) 57.8 kg (127 lb 6.8 oz)    Exam  General: Well developed, thin, NAD  HEENT: NCAT, mucous membranes moist.   Cardiovascular: S1 S2 auscultated, no rubs, murmurs or gallops. Regular rate and rhythm.  Respiratory: Clear to auscultation bilaterally with equal chest rise  Abdomen: Soft, nontender, nondistended, + bowel sounds  Extremities: warm dry without cyanosis clubbing or edema  Neuro: AAOx3, nonfocal,  strength equal and bilateral in upper/lower ext (overall weak)  Psych: Normal affect and demeanor with intact judgement and insight   Data Reviewed: I have personally reviewed following labs and imaging studies  CBC:  Recent Labs Lab 05/20/17 0918 05/20/17 1702  WBC 5.4 5.6  NEUTROABS 4.2  --   HGB 11.9* 11.1*  HCT 36.0* 34.3*  MCV 86.5 87.5  PLT 224 283   Basic Metabolic Panel:  Recent Labs Lab 05/20/17 0918 05/20/17 1702  NA 141  --   K 3.7  --   CL 102  --   CO2 29  --   GLUCOSE 106*  --   BUN 25*  --   CREATININE 1.08 1.07  CALCIUM 10.1  --    GFR: Estimated Creatinine Clearance: 48.8 mL/min (by C-G formula based on SCr of 1.07 mg/dL). Liver Function Tests:  Recent Labs Lab 05/20/17 0918  AST 16  ALT 17  ALKPHOS 48  BILITOT 0.9  PROT 7.5  ALBUMIN 4.4   No results for input(s): LIPASE, AMYLASE in the last 168 hours. No results for input(s): AMMONIA in the last 168 hours. Coagulation Profile: No results for input(s): INR, PROTIME in the last 168 hours. Cardiac Enzymes:  Recent Labs Lab 05/20/17 0918  TROPONINI <0.03   BNP (last 3 results) No results for input(s): PROBNP in the last 8760 hours. HbA1C: No results for input(s): HGBA1C in the last 72 hours. CBG: No results for input(s): GLUCAP in the last 168 hours. Lipid Profile:  Recent Labs  05/21/17 0548  CHOL 163  HDL 52  LDLCALC 97  TRIG 71  CHOLHDL 3.1   Thyroid Function Tests: No results for input(s): TSH, T4TOTAL, FREET4, T3FREE, THYROIDAB in the last 72 hours. Anemia Panel:  Recent Labs  05/20/17 1702  VITAMINB12 641   Urine analysis:    Component Value Date/Time   COLORURINE YELLOW 05/20/2017 0918   APPEARANCEUR CLEAR 05/20/2017 0918   LABSPEC 1.017 05/20/2017 0918   PHURINE 6.0 05/20/2017 0918   GLUCOSEU NEGATIVE 05/20/2017 0918   HGBUR NEGATIVE 05/20/2017 0918   BILIRUBINUR NEGATIVE 05/20/2017 0918   KETONESUR NEGATIVE 05/20/2017 0918   PROTEINUR NEGATIVE  05/20/2017 0918   UROBILINOGEN 0.2 05/14/2017 1256   NITRITE NEGATIVE 05/20/2017 0918   LEUKOCYTESUR NEGATIVE 05/20/2017 0918   Sepsis Labs: @LABRCNTIP (procalcitonin:4,lacticidven:4)  )No results found for this or any previous visit (from the past 240 hour(s)).    Radiology Studies: Dg Chest 2 View  Result Date: 05/20/2017 CLINICAL DATA:  Unsteady gait today.  Weakness. EXAM: CHEST  2 VIEW COMPARISON:  01/22/2016 FINDINGS: Cardiac silhouette is normal in size. No mediastinal or hilar masses. No evidence of adenopathy. Clear lungs.  No pleural effusion or pneumothorax. Skeletal structures are intact. IMPRESSION: No active cardiopulmonary disease. Electronically Signed   By: Dedra Skeens.D.  On: 05/20/2017 09:42   Ct Head Wo Contrast  Result Date: 05/20/2017 CLINICAL DATA:  Wife reports that pt has been falling recently. More falls over the past 2 days Loss of balance and falls over. States he has poor po intake. Noted to be shuffling his feet. Denies pain. Wife reports started back on antidepressant this week. EXAM: CT HEAD WITHOUT CONTRAST TECHNIQUE: Contiguous axial images were obtained from the base of the skull through the vertex without intravenous contrast. COMPARISON:  02/27/2017 FINDINGS: Brain: No evidence of acute infarction, hemorrhage, hydrocephalus, extra-axial collection or mass lesion/mass effect. There is age appropriate ventricular and sulcal enlargement stable from the prior study. Vascular: No hyperdense vessel or unexpected calcification. Skull: Normal. Negative for fracture or focal lesion. Sinuses/Orbits: Globes and orbits are unremarkable. Visualized sinuses and mastoid air cells are clear. Other: None. IMPRESSION: 1. Normal unenhanced CT scan of the brain for age. No change from prior study. Electronically Signed   By: Lajean Manes M.D.   On: 05/20/2017 09:36   Mr Brain Wo Contrast  Result Date: 05/21/2017 CLINICAL DATA:  Frequent falls with gait instability and  dizziness. Symptoms began acutely yesterday. EXAM: MRI HEAD WITHOUT CONTRAST MRA HEAD WITHOUT CONTRAST TECHNIQUE: Multiplanar, multiecho pulse sequences of the brain and surrounding structures were obtained without intravenous contrast. Angiographic images of the head were obtained using MRA technique without contrast. COMPARISON:  CT 05/20/2017 and 02/27/2017 FINDINGS: MRI HEAD FINDINGS Brain: Diffusion imaging does not show any acute or subacute infarction. Mild chronic small-vessel ischemic changes are present affecting the pons in the cerebral hemispheric deep and subcortical white matter. No cortical or large vessel territory infarction. No mass lesion, hemorrhage, hydrocephalus or extra-axial collection. Vascular: Major vessels at the base of the brain show flow. Skull and upper cervical spine: Negative Sinuses/Orbits: Clear/normal Other: None significant MRA HEAD FINDINGS Both internal carotid arteries are patent through the skullbase and siphon regions. There is motion degradation in the siphon region. Anterior and middle cerebral vessels are patent without proximal stenosis, aneurysm or vascular malformation. Both vertebral arteries are patent to the basilar. No basilar stenosis. Posterior circulation branch vessels appear normal. IMPRESSION: No acute finding by MRI. 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 Negative intracranial MR angiography of the large and medium size vessels. Some motion degradation. Electronically Signed   By: Nelson Chimes M.D.   On: 05/21/2017 09:20   Mr Jodene Nam Head/brain QJ Cm  Result Date: 05/21/2017 CLINICAL DATA:  Frequent falls with gait instability and dizziness. Symptoms began acutely yesterday. EXAM: MRI HEAD WITHOUT CONTRAST MRA HEAD WITHOUT CONTRAST TECHNIQUE: Multiplanar, multiecho pulse sequences of the brain and surrounding structures were obtained without intravenous contrast. Angiographic images of the head were  obtained using MRA technique without contrast. COMPARISON:  CT 05/20/2017 and 02/27/2017 FINDINGS: MRI HEAD FINDINGS Brain: Diffusion imaging does not show any acute or subacute infarction. Mild chronic small-vessel ischemic changes are present affecting the pons in the cerebral hemispheric deep and subcortical white matter. No cortical or large vessel territory infarction. No mass lesion, hemorrhage, hydrocephalus or extra-axial collection. Vascular: Major vessels at the base of the brain show flow. Skull and upper cervical spine: Negative Sinuses/Orbits: Clear/normal Other: None significant MRA HEAD FINDINGS Both internal carotid arteries are patent through the skullbase and siphon regions. There is motion degradation in the siphon region. Anterior and middle cerebral vessels are patent without proximal stenosis, aneurysm or vascular malformation. Both vertebral arteries are patent to the basilar. No  basilar stenosis. Posterior circulation branch vessels appear normal. IMPRESSION: No acute finding by MRI. 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 Negative intracranial MR angiography of the large and medium size vessels. Some motion degradation. Electronically Signed   By: Nelson Chimes M.D.   On: 05/21/2017 09:20     Scheduled Meds: . enoxaparin (LOVENOX) injection  40 mg Subcutaneous Q24H  . multivitamin with minerals  1 tablet Oral Daily   Continuous Infusions: . sodium chloride 75 mL/hr at 05/21/17 1141     LOS: 0 days   Time Spent in minutes   30 minutes  Violia Knopf D.O. on 05/21/2017 at 11:47 AM  Between 7am to 7pm - Pager - [phone]  After 7pm go to [email or web address] - password [email or web address]  And look for the night coverage person covering for me after hours  [facility] Office  [phone]

## 2017-05-21 NOTE — Progress Notes (Signed)
VASCULAR LAB PRELIMINARY  PRELIMINARY  PRELIMINARY  PRELIMINARY  Carotid duplex completed.    Preliminary report:  1-39% ICA stenosis.  Vertebral artery flow is antegrade.   Kellan Boehlke, RVT 05/21/2017, 3:15 PM

## 2017-05-21 NOTE — Progress Notes (Signed)
  Echocardiogram 2D Echocardiogram has been performed.  Jennette Dubin 05/21/2017, 3:00 PM

## 2017-05-21 NOTE — Progress Notes (Signed)
Devin Valdez to be D/C'd Home per MD order.  Discussed with the patient and all questions fully answered.  VSS, Skin clean, dry and intact without evidence of skin break down, no evidence of skin tears noted. IV catheter discontinued intact. Site without signs and symptoms of complications. Dressing and pressure applied.  An After Visit Summary was printed and given to the patient. Patient received prescription.  D/c education completed with patient/family including follow up instructions, medication list, d/c activities limitations if indicated, with other d/c instructions as indicated by MD - patient able to verbalize understanding, all questions fully answered.   Patient instructed to return to ED, call 911, or call MD for any changes in condition.   Patient escorted via [place], and D/C home via private auto.  Devin Valdez 05/21/2017 5:49 PM

## 2017-05-21 NOTE — Evaluation (Signed)
Physical Therapy Evaluation Patient Details Name: Devin Valdez MRN: [id number] DOB: 05-21-1941 Today's Date: 05/21/2017   History of Present Illness  Pt is a 75 yo male who was admitted through ED on 05/20/17 after having multiple falls and increased weakness. PMH significant for HTN, Depression.   Clinical Impression  Pt presents with the above diagnosis and below deficits for therapy evaluation. Prior to admission, pt lived with his wife in a multilevel home and was completely independent. Pt requires Min guard to supervision for all mobility this session with a significant drop in BP with mobility. Pt was educated on signs and symptoms of orthostatic hypotension and how to compensate. Pt will benefit from continued acute PT follow-up in order to address the below deficits prior to D/C.    Follow Up Recommendations No PT follow up    Equipment Recommendations  None recommended by PT    Recommendations for Other Services       Precautions / Restrictions Precautions Precautions: Fall Restrictions Weight Bearing Restrictions: No      Mobility  Bed Mobility Overal bed mobility: Modified Independent             General bed mobility comments: able to get EOB with HOB elevated, no physical assistance  Transfers Overall transfer level: Needs assistance Equipment used: Rolling walker (2 wheeled) Transfers: Sit to/from Stand Sit to Stand: Supervision         General transfer comment: supervision for safety  Ambulation/Gait Ambulation/Gait assistance: Supervision Ambulation Distance (Feet): 75 Feet Assistive device: Rolling walker (2 wheeled);None Gait Pattern/deviations: Step-through pattern;Decreased step length - right;Decreased step length - left Gait velocity: decreased Gait velocity interpretation: Below normal speed for age/gender General Gait Details: slow, steady gait. Improved sequencing as distance increased.   Stairs            Wheelchair  Mobility    Modified Rankin (Stroke Patients Only)       Balance Overall balance assessment: Needs assistance Sitting-balance support: No upper extremity supported;Feet supported Sitting balance-Leahy Scale: Normal     Standing balance support: No upper extremity supported Standing balance-Leahy Scale: Fair                               Pertinent Vitals/Pain Pain Assessment: No/denies pain    Home Living Family/patient expects to be discharged to:: Private residence Living Arrangements: Spouse/significant other Available Help at Discharge: Family;Personal care attendant Type of Home: House Home Access: Stairs to enter   Technical brewer of Steps: 2 Home Layout: Multi-level Home Equipment: None      Prior Function Level of Independence: Independent         Comments: walking withough AD, driving and doing for himself prior to admission     Hand Dominance   Dominant Hand: Right    Extremity/Trunk Assessment   Upper Extremity Assessment Upper Extremity Assessment: Defer to OT evaluation    Lower Extremity Assessment Lower Extremity Assessment: Overall WFL for tasks assessed    Cervical / Trunk Assessment Cervical / Trunk Assessment: Normal  Communication   Communication: No difficulties  Cognition Arousal/Alertness: Awake/alert Behavior During Therapy: WFL for tasks assessed/performed Overall Cognitive Status: Within Functional Limits for tasks assessed                                        General Comments General  comments (skin integrity, edema, etc.): Pt's orthostatics taken and recorded in vitals section. Supine 132/67, Seated 118/65, standing 102/59.    Exercises     Assessment/Plan    PT Assessment Patient needs continued PT services  PT Problem List Decreased activity tolerance;Decreased balance;Decreased mobility       PT Treatment Interventions DME instruction;Gait training;Stair training;Functional  mobility training;Therapeutic activities;Therapeutic exercise;Balance training    PT Goals (Current goals can be found in the Care Plan section)  Acute Rehab PT Goals Patient Stated Goal: to get home today PT Goal Formulation: With patient Time For Goal Achievement: 05/28/17 Potential to Achieve Goals: Good    Frequency Min 3X/week   Barriers to discharge        Co-evaluation               AM-PAC PT "6 Clicks" Daily Activity  Outcome Measure Difficulty turning over in bed (including adjusting bedclothes, sheets and blankets)?: None Difficulty moving from lying on back to sitting on the side of the bed? : None Difficulty sitting down on and standing up from a chair with arms (e.g., wheelchair, bedside commode, etc,.)?: A Little Help needed moving to and from a bed to chair (including a wheelchair)?: A Little Help needed walking in hospital room?: A Little Help needed climbing 3-5 steps with a railing? : A Little 6 Click Score: 20    End of Session Equipment Utilized During Treatment: Gait belt Activity Tolerance: Patient tolerated treatment well Patient left: in bed;with call bell/phone within reach;with family/visitor present Nurse Communication: Mobility status PT Visit Diagnosis: Unsteadiness on feet (R26.81)    Time: 5945-8592 PT Time Calculation (min) (ACUTE ONLY): 27 min   Charges:   PT Evaluation $PT Eval Low Complexity: 1 Procedure PT Treatments $Gait Training: 8-22 mins   PT G Codes:   PT G-Codes **NOT FOR INPATIENT CLASS** Functional Assessment Tool Used: AM-PAC 6 Clicks Basic Mobility;Clinical judgement Functional Limitation: Mobility: Walking and moving around Mobility: Walking and Moving Around Current Status (T2446): At least 20 percent but less than 40 percent impaired, limited or restricted Mobility: Walking and Moving Around Goal Status [phone]): 0 percent impaired, limited or restricted    Scheryl Marten PT, DPT  [phone]   Shanon Rosser 05/21/2017, 1:22 PM

## 2017-05-22 LAB — HEMOGLOBIN A1C
Hgb A1c MFr Bld: 4.8 % (ref 4.8–5.6)
Mean Plasma Glucose: 91 mg/dL

## 2017-05-22 LAB — VAS US CAROTID
LCCAPDIAS: 26 cm/s
LEFT ECA DIAS: -7 cm/s
LEFT VERTEBRAL DIAS: -27 cm/s
LICADDIAS: -29 cm/s
LICAPDIAS: 21 cm/s
LICAPSYS: 82 cm/s
Left CCA dist dias: -12 cm/s
Left CCA dist sys: -63 cm/s
Left CCA prox sys: 106 cm/s
Left ICA dist sys: -99 cm/s
RCCAPDIAS: 12 cm/s
RIGHT ECA DIAS: -6 cm/s
RIGHT VERTEBRAL DIAS: 17 cm/s
Right CCA prox sys: 96 cm/s
Right cca dist sys: -103 cm/s

## 2017-05-22 LAB — URINE CULTURE: Culture: NO GROWTH

## 2017-05-23 ENCOUNTER — Other Ambulatory Visit: Payer: Self-pay

## 2017-05-23 ENCOUNTER — Emergency Department (HOSPITAL_COMMUNITY)
Admission: EM | Admit: 2017-05-23 | Discharge: 2017-05-23 | Disposition: A | Discharge status: Home / Self Care | Payer: Medicare Other | Source: Home / Self Care | Attending: Emergency Medicine | Admitting: Emergency Medicine

## 2017-05-23 ENCOUNTER — Encounter (HOSPITAL_COMMUNITY): Payer: Self-pay | Admitting: Emergency Medicine

## 2017-05-23 DIAGNOSIS — R42 Dizziness and giddiness: Secondary | ICD-10-CM | POA: Insufficient documentation

## 2017-05-23 DIAGNOSIS — Z79899 Other long term (current) drug therapy: Secondary | ICD-10-CM | POA: Insufficient documentation

## 2017-05-23 DIAGNOSIS — Z87891 Personal history of nicotine dependence: Secondary | ICD-10-CM

## 2017-05-23 DIAGNOSIS — Z7982 Long term (current) use of aspirin: Secondary | ICD-10-CM | POA: Insufficient documentation

## 2017-05-23 DIAGNOSIS — R2681 Unsteadiness on feet: Secondary | ICD-10-CM | POA: Diagnosis not present

## 2017-05-23 DIAGNOSIS — E43 Unspecified severe protein-calorie malnutrition: Secondary | ICD-10-CM | POA: Diagnosis not present

## 2017-05-23 DIAGNOSIS — Z9181 History of falling: Secondary | ICD-10-CM

## 2017-05-23 DIAGNOSIS — R627 Adult failure to thrive: Secondary | ICD-10-CM | POA: Diagnosis not present

## 2017-05-23 DIAGNOSIS — I10 Essential (primary) hypertension: Secondary | ICD-10-CM | POA: Insufficient documentation

## 2017-05-23 DIAGNOSIS — I951 Orthostatic hypotension: Secondary | ICD-10-CM | POA: Diagnosis not present

## 2017-05-23 DIAGNOSIS — R296 Repeated falls: Secondary | ICD-10-CM | POA: Diagnosis not present

## 2017-05-23 DIAGNOSIS — D509 Iron deficiency anemia, unspecified: Secondary | ICD-10-CM | POA: Diagnosis not present

## 2017-05-23 LAB — URINALYSIS, ROUTINE W REFLEX MICROSCOPIC
Bilirubin Urine: NEGATIVE
Glucose, UA: NEGATIVE mg/dL
Hgb urine dipstick: NEGATIVE
KETONES UR: NEGATIVE mg/dL
Leukocytes, UA: NEGATIVE
NITRITE: NEGATIVE
PH: 5 (ref 5.0–8.0)
Protein, ur: NEGATIVE mg/dL
SPECIFIC GRAVITY, URINE: 1.012 (ref 1.005–1.030)

## 2017-05-23 LAB — BASIC METABOLIC PANEL
ANION GAP: 11 (ref 5–15)
BUN: 13 mg/dL (ref 6–20)
CHLORIDE: 101 mmol/L (ref 101–111)
CO2: 23 mmol/L (ref 22–32)
Calcium: 9.7 mg/dL (ref 8.9–10.3)
Creatinine, Ser: 0.98 mg/dL (ref 0.61–1.24)
Glucose, Bld: 116 mg/dL — ABNORMAL HIGH (ref 65–99)
POTASSIUM: 3.9 mmol/L (ref 3.5–5.1)
Sodium: 135 mmol/L (ref 135–145)

## 2017-05-23 LAB — CBC
HEMATOCRIT: 34.2 % — AB (ref 39.0–52.0)
HEMOGLOBIN: 10.9 g/dL — AB (ref 13.0–17.0)
MCH: 27.5 pg (ref 26.0–34.0)
MCHC: 31.9 g/dL (ref 30.0–36.0)
MCV: 86.1 fL (ref 78.0–100.0)
Platelets: 238 10*3/uL (ref 150–400)
RBC: 3.97 MIL/uL — AB (ref 4.22–5.81)
RDW: 12.3 % (ref 11.5–15.5)
WBC: 5.9 10*3/uL (ref 4.0–10.5)

## 2017-05-23 MED ORDER — MECLIZINE HCL 12.5 MG PO TABS: 12.5000 mg | ORAL_TABLET | Freq: Three times a day (TID) | ORAL | 1 refills | Status: DC | PRN

## 2017-05-23 MED ORDER — MECLIZINE HCL 25 MG PO TABS
25.0000 mg | ORAL_TABLET | Freq: Once | ORAL | Status: AC
  Administered 2017-05-23: 25 mg via ORAL
  Filled 2017-05-23: qty 1

## 2017-05-23 MED ORDER — SODIUM CHLORIDE 0.9 % IV BOLUS (SEPSIS)
1000.0000 mL | Freq: Once | INTRAVENOUS | Status: AC
  Administered 2017-05-23: 1000 mL via INTRAVENOUS

## 2017-05-23 NOTE — ED Triage Notes (Signed)
Wife stated, He started fall or stumbling on Friday and I took him to [facility] on Saturday and they sent him to [facility]. He had a MRI  And all kinds of test and nothing showed up. Sent him home and he still is weak and still stumbling.  So something is wrong.

## 2017-05-23 NOTE — ED Notes (Signed)
Patient Alert and oriented X4. Stable and ambulatory. Patient verbalized understanding of the discharge instructions.  Patient belongings were taken by the patient.  

## 2017-05-23 NOTE — ED Provider Notes (Signed)
[facility] DEPT Provider Note   CSN: [id number] Arrival date & time: 05/23/17  9357     History   Chief Complaint Chief Complaint  Patient presents with  . Fall  . Weakness    HPI Devin Valdez is a 76 y.o. male.  Devin Valdez is a 76 y.o. Male who presents to the emergency department with his wife who reports the patient has had ongoing difficulties with dizziness and unsteady balance for the past several days. Patient was seen in the emergency department about 3 days ago and was admitted for possible stroke workup. In the emergency department at [facility] he had a CT of his head, MRI and MRA of his brain. All workup was unremarkable. Blood work was unremarkable. No evidence of infection. Wife reports at discharge she was doing somewhat better, however yesterday and today his room spinning dizziness has worsened. Patient reports that he feels the room is spinning around him at times. It is made worse with head movement. Wife reports he's had some difficulty with his gait at times due to feeling off balance. He last fell 5 days ago. She reports he fell onto the couch and did not hit his head. He's had no recent head injury. He is not on anticoagulants. No known history of vertigo. No treatments attempted prior to arrival. Patient does have some difficulties with memory at times. Patient denies fevers, coughing, chest pain, shortness of breath, neck pain, changes to his vision, abdominal pain, nausea, vomiting, or rashes.    The history is provided by the patient, medical records and the spouse. No language interpreter was used.    Past Medical History:  Diagnosis Date  . Depression   . Hallucinations   . Hypertension     Patient Active Problem List   Diagnosis Date Noted  . Gait instability 05/20/2017  . Frequent falls 05/20/2017  . Depression 05/20/2017    Past Surgical History:  Procedure Laterality Date  . EYE SURGERY         Home Medications    Prior  to Admission medications   Medication Sig Start Date End Date Taking? Authorizing Provider  aspirin EC 81 MG tablet Take 81 mg by mouth daily.   Yes [provider]  losartan-hydrochlorothiazide (HYZAAR) 50-12.5 MG tablet Take 1 tablet by mouth daily. 02/17/17  Yes [provider]  Multiple Vitamins-Minerals (CENTRUM SILVER ADULT 50+) TABS Take 1 capsule by mouth 2 (two) times daily. Patient taking differently: Take 1 tablet by mouth daily.  01/22/16  Yes Kindl, Nelda Severe, MD  escitalopram (LEXAPRO) 10 MG tablet Take 1 tablet (10 mg total) by mouth daily. Patient not taking: Reported on 05/23/2017 05/21/17   Cristal Ford, DO  meclizine (ANTIVERT) 12.5 MG tablet Take 1 tablet (12.5 mg total) by mouth 3 (three) times daily as needed for dizziness. 05/23/17   Waynetta Pean, PA-C    Family History No family history on file.  Social History Social History  Substance Use Topics  . Smoking status: Former Smoker    Quit date: 11/21/1985  . Smokeless tobacco: Never Used  . Alcohol use No     Allergies   Patient has no known allergies.   Review of Systems Review of Systems  Constitutional: Negative for chills and fever.  HENT: Negative for congestion, ear pain, facial swelling and sore throat.   Eyes: Negative for visual disturbance.  Respiratory: Negative for cough and shortness of breath.   Cardiovascular: Negative for chest pain and palpitations.  Gastrointestinal: Negative for abdominal pain, diarrhea, nausea and vomiting.  Genitourinary: Negative for difficulty urinating, dysuria, frequency and urgency.  Musculoskeletal: Positive for gait problem. Negative for back pain and neck pain.  Skin: Negative for rash.  Neurological: Positive for dizziness. Negative for syncope, speech difficulty, weakness, light-headedness, numbness and headaches.     Physical Exam Updated Vital Signs BP (!) 150/62 (BP Location: Left Arm)   Pulse (!) 59   Temp 98 F (36.7 C)   Resp 18    Ht 5\' 7"  (1.702 m)   Wt 59 kg (130 lb)   SpO2 100%   BMI 20.36 kg/m   Physical Exam  Constitutional: He is oriented to person, place, and time. He appears well-developed and well-nourished. No distress.  Nontoxic appearing.  HENT:  Head: Normocephalic and atraumatic.  Right Ear: External ear normal.  Left Ear: External ear normal.  Mouth/Throat: Oropharynx is clear and moist.  Bilateral tympanic membranes are pearly-gray without erythema or loss of landmarks.   Eyes: Conjunctivae and EOM are normal. Pupils are equal, round, and reactive to light. Right eye exhibits no discharge. Left eye exhibits no discharge.  Neck: Normal range of motion. Neck supple.  Cardiovascular: Normal rate, regular rhythm, normal heart sounds and intact distal pulses.  Exam reveals no gallop and no friction rub.   No murmur heard. Pulmonary/Chest: Effort normal and breath sounds normal. No respiratory distress. He has no wheezes. He has no rales.  Lungs are clear to ascultation bilaterally. Symmetric chest expansion bilaterally. No increased work of breathing. No rales or rhonchi.    Abdominal: Soft. There is no tenderness. There is no guarding.  Musculoskeletal: Normal range of motion. He exhibits no edema or tenderness.  Patient is spontaneously moving all extremities in a coordinated fashion exhibiting good strength.   Lymphadenopathy:    He has no cervical adenopathy.  Neurological: He is alert and oriented to person, place, and time. No cranial nerve deficit or sensory deficit. He exhibits normal muscle tone. Coordination normal.  Patient is alert and oriented 3. Cranial nerves are intact. Speech is clear and coherent. No pronator drift. Finger to nose intact bilaterally. EOMs are intact. Vision is grossly intact. Patient reports he has room spinning with movement of his head.   Skin: Skin is warm and dry. Capillary refill takes less than 2 seconds. No rash noted. He is not diaphoretic. No erythema. No  pallor.  Psychiatric: He has a normal mood and affect. His behavior is normal.  Nursing note and vitals reviewed.    ED Treatments / Results  Labs (all labs ordered are listed, but only abnormal results are displayed) Labs Reviewed  BASIC METABOLIC PANEL - Abnormal; Notable for the following:       Result Value   Glucose, Bld 116 (*)    All other components within normal limits  CBC - Abnormal; Notable for the following:    RBC 3.97 (*)    Hemoglobin 10.9 (*)    HCT 34.2 (*)    All other components within normal limits  URINALYSIS, ROUTINE W REFLEX MICROSCOPIC    EKG  EKG Interpretation None       Radiology No results found.  Procedures Procedures (including critical care time)  Medications Ordered in ED Medications  meclizine (ANTIVERT) tablet 25 mg (25 mg Oral Given 05/23/17 1035)  meclizine (ANTIVERT) tablet 25 mg (25 mg Oral Given 05/23/17 1152)  sodium chloride 0.9 % bolus 1,000 mL (0 mLs Intravenous Stopped 05/23/17 1443)  Initial Impression / Assessment and Plan / ED Course  I have reviewed the triage vital signs and the nursing notes.  Pertinent labs & imaging results that were available during my care of the patient were reviewed by me and considered in my medical decision making (see chart for details).     This is a 77 y.o. Male who presents to the emergency department with his wife who reports the patient has had ongoing difficulties with dizziness and unsteady balance for the past several days. Patient was seen in the emergency department about 3 days ago and was admitted for possible stroke workup. In the emergency department at [facility] he had a CT of his head, MRI and MRA of his brain. All workup was unremarkable. Blood work was unremarkable. No evidence of infection. Wife reports at discharge she was doing somewhat better, however yesterday and today his room spinning dizziness has worsened. Patient reports that he feels the room is spinning around him  at times. It is made worse with head movement. Wife reports he's had some difficulty with his gait at times due to feeling off balance. He last fell 5 days ago. She reports he fell onto the couch and did not hit his head. He's had no recent head injury. He is not on anticoagulants. No known history of vertigo.   On exam the patient is afebrile nontoxic appearing. He has no focal neurological deficits. He reports room spinning dizziness with movement of his head while sitting down. Suspect vertigo.  Will provide with meclizine and reevaluate.  Reevaluation patient reports room spinning dizziness has resolved, however he is feeling lightheaded. Patient is noted to be slightly orthostatic with systolic blood pressure of 150 lying and 119 standing. Will provide with fluid bolus and reevaluate  At reevaluation after fluid bolus patient reports he is still feeling slightly lightheaded. He ambulates without difficulty. He denies room spinning dizziness. Lightheadedness is likely related to some dehydration.  I consulted with neurologist Dr. Leonel Ramsay who agrees that this is likely vertigo in positional lightheadedness due to orthostatic hypotension with position change. Plan for follow-up as an outpatient with neurology.   I discussed this with family. They're in agreement with plan. Patient again ambulates without difficulty. Meclizine at home. Follow-up as an outpatient with neurology. I encouraged to push fluid intake to prevent dehydration. I discussed strict and specific return precautions. I advised the patient to follow-up with their primary care provider this week. I advised the patient to return to the emergency department with new or worsening symptoms or new concerns. The patient verbalized understanding and agreement with plan.    This patient was discussed with and evaluated by Dr. Sherry Ruffing who agrees with assessment and plan.   Final Clinical Impressions(s) / ED Diagnoses   Final diagnoses:    Vertigo  Lightheadedness    New Prescriptions New Prescriptions   MECLIZINE (ANTIVERT) 12.5 MG TABLET    Take 1 tablet (12.5 mg total) by mouth 3 (three) times daily as needed for dizziness.     Waynetta Pean, PA-C 05/23/17 1534    Tegeler, Gwenyth Allegra, MD 05/29/17 1452

## 2017-05-25 ENCOUNTER — Telehealth: Payer: Self-pay

## 2017-05-25 ENCOUNTER — Encounter (HOSPITAL_COMMUNITY): Payer: Self-pay | Admitting: *Deleted

## 2017-05-25 ENCOUNTER — Ambulatory Visit (INDEPENDENT_AMBULATORY_CARE_PROVIDER_SITE_OTHER): Payer: Medicare Other | Admitting: Neurology

## 2017-05-25 ENCOUNTER — Inpatient Hospital Stay (HOSPITAL_COMMUNITY)
Admission: AD | Admit: 2017-05-25 | Discharge: 2017-05-28 | DRG: 312 | Disposition: A | Discharge status: Home Health | Payer: Medicare Other | Source: Ambulatory Visit | Attending: Internal Medicine | Admitting: Internal Medicine

## 2017-05-25 ENCOUNTER — Observation Stay (HOSPITAL_COMMUNITY): Payer: Medicare Other

## 2017-05-25 ENCOUNTER — Encounter: Payer: Self-pay | Admitting: Neurology

## 2017-05-25 VITALS — BP 106/70 | HR 78 | Ht 67.0 in | Wt 128.2 lb

## 2017-05-25 DIAGNOSIS — D649 Anemia, unspecified: Secondary | ICD-10-CM

## 2017-05-25 DIAGNOSIS — R2681 Unsteadiness on feet: Secondary | ICD-10-CM | POA: Diagnosis present

## 2017-05-25 DIAGNOSIS — R269 Unspecified abnormalities of gait and mobility: Secondary | ICD-10-CM | POA: Diagnosis present

## 2017-05-25 DIAGNOSIS — D509 Iron deficiency anemia, unspecified: Secondary | ICD-10-CM | POA: Diagnosis present

## 2017-05-25 DIAGNOSIS — R627 Adult failure to thrive: Secondary | ICD-10-CM | POA: Diagnosis present

## 2017-05-25 DIAGNOSIS — R6251 Failure to thrive (child): Secondary | ICD-10-CM | POA: Diagnosis present

## 2017-05-25 DIAGNOSIS — F329 Major depressive disorder, single episode, unspecified: Secondary | ICD-10-CM | POA: Diagnosis present

## 2017-05-25 DIAGNOSIS — R5381 Other malaise: Secondary | ICD-10-CM | POA: Diagnosis present

## 2017-05-25 DIAGNOSIS — J439 Emphysema, unspecified: Secondary | ICD-10-CM | POA: Diagnosis not present

## 2017-05-25 DIAGNOSIS — Z6823 Body mass index (BMI) 23.0-23.9, adult: Secondary | ICD-10-CM

## 2017-05-25 DIAGNOSIS — D638 Anemia in other chronic diseases classified elsewhere: Secondary | ICD-10-CM | POA: Diagnosis present

## 2017-05-25 DIAGNOSIS — I951 Orthostatic hypotension: Principal | ICD-10-CM | POA: Diagnosis present

## 2017-05-25 DIAGNOSIS — R296 Repeated falls: Secondary | ICD-10-CM

## 2017-05-25 DIAGNOSIS — R42 Dizziness and giddiness: Secondary | ICD-10-CM

## 2017-05-25 DIAGNOSIS — K8020 Calculus of gallbladder without cholecystitis without obstruction: Secondary | ICD-10-CM | POA: Diagnosis not present

## 2017-05-25 DIAGNOSIS — Z87891 Personal history of nicotine dependence: Secondary | ICD-10-CM

## 2017-05-25 DIAGNOSIS — E278 Other specified disorders of adrenal gland: Secondary | ICD-10-CM | POA: Diagnosis present

## 2017-05-25 DIAGNOSIS — E43 Unspecified severe protein-calorie malnutrition: Secondary | ICD-10-CM | POA: Diagnosis present

## 2017-05-25 DIAGNOSIS — R404 Transient alteration of awareness: Secondary | ICD-10-CM | POA: Diagnosis not present

## 2017-05-25 DIAGNOSIS — R1900 Intra-abdominal and pelvic swelling, mass and lump, unspecified site: Secondary | ICD-10-CM | POA: Diagnosis not present

## 2017-05-25 DIAGNOSIS — Z7982 Long term (current) use of aspirin: Secondary | ICD-10-CM

## 2017-05-25 DIAGNOSIS — I10 Essential (primary) hypertension: Secondary | ICD-10-CM

## 2017-05-25 DIAGNOSIS — F32A Depression, unspecified: Secondary | ICD-10-CM | POA: Diagnosis present

## 2017-05-25 DIAGNOSIS — Z79899 Other long term (current) drug therapy: Secondary | ICD-10-CM

## 2017-05-25 DIAGNOSIS — E785 Hyperlipidemia, unspecified: Secondary | ICD-10-CM | POA: Diagnosis present

## 2017-05-25 LAB — IRON AND TIBC
Iron: 28 ug/dL — ABNORMAL LOW (ref 45–182)
SATURATION RATIOS: 10 % — AB (ref 17.9–39.5)
TIBC: 273 ug/dL (ref 250–450)
UIBC: 245 ug/dL

## 2017-05-25 LAB — FERRITIN: Ferritin: 215 ng/mL (ref 24–336)

## 2017-05-25 LAB — RETICULOCYTES
RBC.: 3.95 MIL/uL — AB (ref 4.22–5.81)
RETIC CT PCT: 2.8 % (ref 0.4–3.1)
Retic Count, Absolute: 110.6 10*3/uL (ref 19.0–186.0)

## 2017-05-25 LAB — PREALBUMIN: PREALBUMIN: 33.1 mg/dL (ref 18–38)

## 2017-05-25 LAB — FOLATE: FOLATE: 30.6 ng/mL (ref >5.9)

## 2017-05-25 LAB — VITAMIN B12: VITAMIN B 12: 667 pg/mL (ref 180–914)

## 2017-05-25 MED ORDER — LOSARTAN POTASSIUM-HCTZ 50-12.5 MG PO TABS: 1.0000 | ORAL_TABLET | Freq: Every day | ORAL | Status: DC

## 2017-05-25 MED ORDER — IOPAMIDOL (ISOVUE-300) INJECTION 61%
INTRAVENOUS | Status: AC
  Administered 2017-05-25: 100 mL
  Filled 2017-05-25: qty 100

## 2017-05-25 MED ORDER — IOPAMIDOL (ISOVUE-300) INJECTION 61%
INTRAVENOUS | Status: AC
  Administered 2017-05-25: 30 mL
  Filled 2017-05-25: qty 30

## 2017-05-25 MED ORDER — MECLIZINE HCL 12.5 MG PO TABS: 12.5000 mg | ORAL_TABLET | Freq: Three times a day (TID) | ORAL | Status: DC | PRN

## 2017-05-25 MED ORDER — HYDROCHLOROTHIAZIDE 12.5 MG PO CAPS
12.5000 mg | ORAL_CAPSULE | Freq: Every day | ORAL | Status: DC
  Administered 2017-05-26 – 2017-05-28 (×3): 12.5 mg via ORAL
  Filled 2017-05-25 (×3): qty 1

## 2017-05-25 MED ORDER — MIRTAZAPINE 15 MG PO TBDP
7.5000 mg | ORAL_TABLET | Freq: Every day | ORAL | Status: DC
  Administered 2017-05-25 – 2017-05-26 (×2): 7.5 mg via ORAL
  Filled 2017-05-25 (×4): qty 0.5

## 2017-05-25 MED ORDER — SODIUM CHLORIDE 0.9 % IV BOLUS (SEPSIS)
1000.0000 mL | Freq: Once | INTRAVENOUS | Status: AC
  Administered 2017-05-25: 1000 mL via INTRAVENOUS

## 2017-05-25 MED ORDER — ONDANSETRON HCL 4 MG PO TABS: 4.0000 mg | ORAL_TABLET | Freq: Four times a day (QID) | ORAL | Status: DC | PRN

## 2017-05-25 MED ORDER — SODIUM CHLORIDE 0.9% FLUSH
3.0000 mL | Freq: Two times a day (BID) | INTRAVENOUS | Status: DC
  Administered 2017-05-25 – 2017-05-28 (×2): 3 mL via INTRAVENOUS

## 2017-05-25 MED ORDER — ACETAMINOPHEN 650 MG RE SUPP: 650.0000 mg | Freq: Four times a day (QID) | RECTAL | Status: DC | PRN

## 2017-05-25 MED ORDER — ASPIRIN EC 81 MG PO TBEC
81.0000 mg | DELAYED_RELEASE_TABLET | Freq: Every day | ORAL | Status: DC
  Administered 2017-05-26 – 2017-05-28 (×3): 81 mg via ORAL
  Filled 2017-05-25 (×3): qty 1

## 2017-05-25 MED ORDER — HYDROCODONE-ACETAMINOPHEN 5-325 MG PO TABS: 1.0000 | ORAL_TABLET | ORAL | Status: DC | PRN

## 2017-05-25 MED ORDER — ESCITALOPRAM OXALATE 10 MG PO TABS: 20.0000 mg | ORAL_TABLET | Freq: Every day | ORAL | Status: DC

## 2017-05-25 MED ORDER — ACETAMINOPHEN 325 MG PO TABS: 650.0000 mg | ORAL_TABLET | Freq: Four times a day (QID) | ORAL | Status: DC | PRN

## 2017-05-25 MED ORDER — SENNOSIDES-DOCUSATE SODIUM 8.6-50 MG PO TABS: 1.0000 | ORAL_TABLET | Freq: Every evening | ORAL | Status: DC | PRN

## 2017-05-25 MED ORDER — SODIUM CHLORIDE 0.9 % IV SOLN
INTRAVENOUS | Status: DC
  Administered 2017-05-25 – 2017-05-27 (×5): via INTRAVENOUS
  Administered 2017-05-28: 1000 mL via INTRAVENOUS

## 2017-05-25 MED ORDER — BISACODYL 10 MG RE SUPP: 10.0000 mg | Freq: Every day | RECTAL | Status: DC | PRN

## 2017-05-25 MED ORDER — LOSARTAN POTASSIUM 50 MG PO TABS
50.0000 mg | ORAL_TABLET | Freq: Every day | ORAL | Status: DC
  Administered 2017-05-26 – 2017-05-28 (×3): 50 mg via ORAL
  Filled 2017-05-25 (×3): qty 1

## 2017-05-25 MED ORDER — ONDANSETRON HCL 4 MG/2ML IJ SOLN: 4.0000 mg | Freq: Four times a day (QID) | INTRAMUSCULAR | Status: DC | PRN

## 2017-05-25 NOTE — Progress Notes (Signed)
[facility] [address] [place]. [place] [address] [phone]       OFFICE CONSULT NOTE  [name] Date of Birth:  12-Nov-1941 Medical Record Number:  [id number]   Referring MD:  Devin Valdez  Reason for Referral: dizziness  HPI: Mr Devin Valdez is a 54 year sig American male seen emergently today for consultation first severe dizziness. History is up 10 from the patient, his wife and review of electronic medical records both at [facility] through care everywhere. I have personally reviewed imaging films and lab results Patient states he is developed sudden onset of dizziness 6 days ago last Friday. He complained of feeling of severe imbalance and nearly passing out and fainting when he tries to sit up or stand up. He seems to do better if his lying quietly. He denies true vertigo, nausea and loss of vision, slurred speech or focal extremity weakness and numbness. He was seen in the emergency room at [facility] on 05/20/17 for the same symptoms and underwent CT scan of the head followed by an MRI scan of the brain both of which were unremarkable. He was transferred from [facility] to [facility] where a brainstem infarct was ruled out by doing a CT scan, MRI and MRA of the brain. Echocardiogram showed normal ejection fraction. Carotid Doppler showed no significant extracranial stenosis. Patient was started on aspirin and rest of the workup including urine analysis, drug screen chest x-ray were unremarkable. Patient was felt to be disease second to poor oral intake due to depression. Patient return back to the ER 3 days later with similar complaints and again got better with some treatment and was sent home. Patient denies any prior history of strokes, TIAs, seizures or significant neurological problems. 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 He underwent extensive evaluation there including MRI  scan of the brain, spinal tap and lab work including paraneoplastic panel antibodies which were all negative. Spinal fluid showed slightly elevated CSF protein of 78 mg percent but normal cell count. Lactic acid level and vitamin B12 levels, and ammonia levels were also normal. Patient was initially started on Seroquel but his psychosis the patient improved and currently is not on any antipsychotic medications. He does take blood pressure medication which includes a diuretic. His last basic metabolic panel labs on 02/25/6545 was normal. He denies significant headache or focal neurological symptoms at the present time. He has no history of significant head injury with loss of consciousness, skull fracture, seizures or strokes in the past ROS:   14 system review of systems is positive for  runny nose, and drooling at night, constipation, excessive thirst, daytime sleepiness, speech difficulty, easy bruising, anxiety, nervousness and dizziness  PMH:  Past Medical History:  Diagnosis Date  . Depression   . Falls   . Hallucinations   . Hypertension     Social History:  Social History   Social History  . Marital status: Married    Spouse name: N/A  . Number of children: N/A  . Years of education: N/A   Occupational History  . Not on file.   Social History Main Topics  . Smoking status: Former Smoker    Quit date: 11/21/1985  . Smokeless tobacco: Never Used  . Alcohol use No  . Drug use: No  . Sexual activity: Not on file   Other Topics Concern  . Not on file  Social History Narrative  . No narrative on file    Medications:   Current Outpatient Prescriptions on File Prior to Visit  Medication Sig Dispense Refill  . aspirin EC 81 MG tablet Take 81 mg by mouth daily.    Marland Kitchen escitalopram (LEXAPRO) 10 MG tablet Take 1 tablet (10 mg total) by mouth daily.    Marland Kitchen losartan-hydrochlorothiazide (HYZAAR) 50-12.5 MG tablet Take 1 tablet by mouth daily.    . meclizine (ANTIVERT) 12.5 MG tablet  Take 1 tablet (12.5 mg total) by mouth 3 (three) times daily as needed for dizziness. 30 tablet 1  . Multiple Vitamins-Minerals (CENTRUM SILVER ADULT 50+) TABS Take 1 capsule by mouth 2 (two) times daily. (Patient taking differently: Take 1 tablet by mouth daily. ) 30 tablet 2   No current facility-administered medications on file prior to visit.     Allergies:  No Known Allergies  Physical Exam General: frail elderly African-American male, seated, in  evident distress from his dizziness. He looks comfortable lying in bed but complains of dizziness when he tries to sit up Head: head normocephalic and atraumatic.   Neck: supple with no carotid or supraclavicular bruits Cardiovascular: regular rate and rhythm, no murmurs Musculoskeletal: no deformity Skin:  no rash/petichiae Vascular:  Normal pulses all extremities  Neurologic Exam Mental Status: Awake and fully alert. Oriented to place and time. Recent and remote memory intact. Attention span, concentration and fund of knowledge appropriate. Mood and affect appropriate.  Cranial Nerves: Fundoscopic exam deferred. Pupils equal, briskly reactive to light. Extraocular movements full without nystagmus. Visual fields seem full to confrontation. Hearing intact. Facial sensation intact. Face, tongue, palate moves normally and symmetrically.  Motor: Normal bulk and tone. Normal strength in all tested extremity muscles. Sensory.: intact to touch , pinprick , position and vibratory sensation.  Coordination: Rapid alternating movements normal in all extremities. Finger-to-nose and heel-to-shin performed accurately bilaterally. Gait and Station: deferred as severely orthostatic and dizzy when asked to sit up  Reflexes: 1+ and symmetric. Toes downgoing.       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 Recent extensive evaluation at [facility] including MRI scan 2, spinal tap and lab work all negative    PLAN: The patient and wife state he had severe dizziness since last 5 days and was seen in emergency room but improved somewhat and was sent home. In our office today  He was severely orthostatic and complained of dizziness and passing out when attempted to sit up or walk. He had significant orthostatic hypotension documented with blood pressure dropped from 142/79 to 103/63. I have reviewed his extensive lab evaluation, spinal fluid results as well as MRI scans done both at [facility] as well as at [facility] and were unremarkable Patient requested inpatient hospitalization until he felt better as his family was unable to care for him. I have called [facility] to admit and will inform patient bed placement. Greater than 50% time during this 60 minute consultation visit was spent on counseling and coordination of care about his dizziness, orthostatic hypertension, dizziness tones exercises and medications discussion. Discussed with patient, wife, granddaughter and Dr. Carlisle Cater [facility] Antony Contras, [facility] [address] [place], [place] [address]  Phone [phone] Fax [phone] Note: This document was prepared with digital dictation and possible smart phrase technology. Any transcriptional errors that result  from this process are unintentional.

## 2017-05-25 NOTE — Patient Instructions (Addendum)
The patient and wife state he had severe dizziness since last 5 days and was seen in emergency room but improved somewhat and was sent home. In our office today was severely orthostatic and complained of dizziness and passing out when attempted to sit up or walk. He had significant orthostatic hypotension documented with blood pressure dropped from 142/79 to 103/63. I have reviewed his extensive lab evaluation, spinal fluid results as well as MRI scans done both at [facility] as well as at [facility] on the future were unremarkable Patient requested inpatient hospitalization until he felt better as his family was unable to care for him. I have called [facility] to admit and will inform patient bed placement

## 2017-05-25 NOTE — Telephone Encounter (Signed)
Rn call EMS for non emergency number for transportation to hospital. Rn stated pt will need telemetry to hospital. RN gave contact address pts name for [facility].

## 2017-05-25 NOTE — Telephone Encounter (Signed)
Rn call [facility] bed placement at [phone]. Rn stated pt needs to be admitted to hospital for dizzy spells, weakness, and vertigo. Rn spoke with Mady Haagensen in bed placement. Shirlean Mylar was given pt demographic history about the direct admission.Rn stated per Dr. Leonie Man the admitting MD will be Dr. Benjaman Lobe MD, with a medical telemetry bed. Pt will be on 8m. Rn was told EMS have to go thru the ED and stop to get the room number.

## 2017-05-25 NOTE — Progress Notes (Signed)
Patient arrived on unit via stretcher by [facility] at 1340. Alert and in stable condition. Oriented to call bell and table within reach. Family at bedside.

## 2017-05-25 NOTE — H&P (Signed)
History and Physical    Thatcher DOM [place] YBW:[id number] DOB: 08-28-1941 DOA: 05/25/2017   PCP: [name], MD   Patient coming from:  Home    Chief Complaint: Failure to thrive   HPI: Devin Valdez is a 76 y.o. male with medical history significant for HTN, HLD, depression, recent hospitalizations for failure to thrive, most recent discharge on 05/21/2017, presenting to his neurologist offices with complaints of gait instability, frequents falls, increased debility and failure to thrive. His dizziness is not present when lying down. He was found to be orthostatic, and sent to [facility] for direct admission. During prior admissions, the patient had extensive neurological workup, including CT of the head, MRI of the brain, bilateral carotid ultrasound, all of which were negative for any significant findings. In addition, he had an echocardiogram, which was essentially negative for any acute abnormalities. The patient denies any headaches, or vision changes. No history of seizures or confusion. He denies any history of strokes. He states that his nausea is control with antiemetics. Since April, he has lost a total of 20-25 pounds. His wife is trying to push ensure on him, but he refuses. He denies any dysphagia. He denies any shortness of breath, cough, chest pain, palpitations, jaw pain, unilateral weakness. He denies any abdominal pain, or diarrhea. He denies any lower extremity swelling. He has been depressed since 01/20/2023 after his sister's death, but at the time, even after her passing he was able to eat. Of note, in April, he had a work up at [facility], at which time he reports being told of a lung mass (no records for review) but no further workup was done. He is being admitted for the management of his symptoms, as well as for further imaging and other workup.  ED Course:  BP (!) 169/75 (BP Location: Right Arm)   Pulse 63   Temp 98.1 F (36.7 C) (Oral)   Resp 16   SpO2 100%     labs from07/01/2017 show sodium 135 potassium 3.9 glucose 116 creatinine 0.98 white count 5.9 hemoglobin 10.9 platelets 238 urine pending EKG  05/23/2017 show sinus rhythm without ACS.  Recent CT head and MRI brain neg,  Bilateral carotid ultrasound with minimal stenosis  Last 2 D echo EF 55%.  TSH 1.696.  Other recent hospital workup is negative.   Review of Systems:  As per HPI otherwise all other systems reviewed and are negative  Past Medical History:  Diagnosis Date  . Depression   . Falls   . Hallucinations   . Hypertension     Past Surgical History:  Procedure Laterality Date  . EYE SURGERY      Social History Social History   Social History  . Marital status: Married    Spouse name: N/A  . Number of children: N/A  . Years of education: N/A   Occupational History  . Not on file.   Social History Main Topics  . Smoking status: Former Smoker    Quit date: 11/21/1985  . Smokeless tobacco: Never Used  . Alcohol use No  . Drug use: No  . Sexual activity: Not on file   Other Topics Concern  . Not on file   Social History Narrative  . No narrative on file     No Known Allergies  History reviewed. No pertinent family history.    Prior to Admission medications   Medication Sig Start Date End Date Taking? Authorizing Provider  aspirin EC 81 MG tablet  Take 81 mg by mouth daily.    [provider]  escitalopram (LEXAPRO) 10 MG tablet Take 1 tablet (10 mg total) by mouth daily. 05/21/17   Cristal Ford, DO  escitalopram (LEXAPRO) 20 MG tablet  05/15/17   [provider]  losartan-hydrochlorothiazide (HYZAAR) 50-12.5 MG tablet Take 1 tablet by mouth daily. 02/17/17   [provider]  meclizine (ANTIVERT) 12.5 MG tablet Take 1 tablet (12.5 mg total) by mouth 3 (three) times daily as needed for dizziness. 05/23/17   Waynetta Pean, PA-C  Multiple Vitamins-Minerals (CENTRUM SILVER ADULT 50+) TABS Take 1 capsule by mouth 2 (two) times  daily. Patient taking differently: Take 1 tablet by mouth daily.  01/22/16   Billy Fischer, MD    Physical Exam:  Vitals:   05/25/17 1340  BP: (!) 169/75  Pulse: 63  Resp: 16  Temp: 98.1 F (36.7 C)  TempSrc: Oral  SpO2: 100%   Constitutional: NAD, calm,cachectic, chronically ill appearing Eyes: PERRL, lids and conjunctivae normal ENMT: Mucous membranes are dry, without exudate or lesions  Neck: normal, supple, no masses, no thyromegaly Respiratory: clear to auscultation bilaterally, no wheezing, no crackles. Normal respiratory effort  Cardiovascular: Regular rate and rhythm, no murmurs, rubs or gallops. No extremity edema. 2+ pedal pulses. No carotid bruits.  Abdomen: Soft, non tender, No hepatosplenomegaly. + pulsatile mass in the abdomen.  Bowel sounds positive.  Musculoskeletal: no clubbing / cyanosis. Moves all extremities Skin: no jaundice, No lesions.  Neurologic: Sensation intact  Strength equal in all extremities Psychiatric:   Alert and oriented x 3. Flat affect      Labs on Admission: I have personally reviewed following labs and imaging studies  CBC:  Recent Labs Lab 05/20/17 0918 05/20/17 1702 05/23/17 0944  WBC 5.4 5.6 5.9  NEUTROABS 4.2  --   --   HGB 11.9* 11.1* 10.9*  HCT 36.0* 34.3* 34.2*  MCV 86.5 87.5 86.1  PLT 224 218 700    Basic Metabolic Panel:  Recent Labs Lab 05/20/17 0918 05/20/17 1702 05/23/17 0944  NA 141  --  135  K 3.7  --  3.9  CL 102  --  101  CO2 29  --  23  GLUCOSE 106*  --  116*  BUN 25*  --  13  CREATININE 1.08 1.07 0.98  CALCIUM 10.1  --  9.7    GFR: Estimated Creatinine Clearance: 53.6 mL/min (by C-G formula based on SCr of 0.98 mg/dL).  Liver Function Tests:  Recent Labs Lab 05/20/17 0918  AST 16  ALT 17  ALKPHOS 48  BILITOT 0.9  PROT 7.5  ALBUMIN 4.4   No results for input(s): LIPASE, AMYLASE in the last 168 hours. No results for input(s): AMMONIA in the last 168 hours.  Coagulation Profile: No  results for input(s): INR, PROTIME in the last 168 hours.  Cardiac Enzymes:  Recent Labs Lab 05/20/17 0918  TROPONINI <0.03    BNP (last 3 results) No results for input(s): PROBNP in the last 8760 hours.  HbA1C: No results for input(s): HGBA1C in the last 72 hours.  CBG: No results for input(s): GLUCAP in the last 168 hours.  Lipid Profile: No results for input(s): CHOL, HDL, LDLCALC, TRIG, CHOLHDL, LDLDIRECT in the last 72 hours.  Thyroid Function Tests: No results for input(s): TSH, T4TOTAL, FREET4, T3FREE, THYROIDAB in the last 72 hours.  Anemia Panel: No results for input(s): VITAMINB12, FOLATE, FERRITIN, TIBC, IRON, RETICCTPCT in the last 72 hours.  Urine  analysis:    Component Value Date/Time   COLORURINE YELLOW 05/23/2017 1225   APPEARANCEUR CLEAR 05/23/2017 1225   LABSPEC 1.012 05/23/2017 1225   PHURINE 5.0 05/23/2017 1225   GLUCOSEU NEGATIVE 05/23/2017 1225   HGBUR NEGATIVE 05/23/2017 1225   BILIRUBINUR NEGATIVE 05/23/2017 1225   KETONESUR NEGATIVE 05/23/2017 1225   PROTEINUR NEGATIVE 05/23/2017 1225   UROBILINOGEN 0.2 05/14/2017 1256   NITRITE NEGATIVE 05/23/2017 1225   LEUKOCYTESUR NEGATIVE 05/23/2017 1225    Sepsis Labs: @LABRCNTIP (procalcitonin:4,lacticidven:4) ) Recent Results (from the past 240 hour(s))  Urine culture     Status: None   Collection Time: 05/20/17  9:18 AM  Result Value Ref Range Status   Specimen Description URINE, CLEAN CATCH  Final   Special Requests NONE  Final   Culture   Final    NO GROWTH Performed at [facility], [place] [address], [place], [place] [address]    Report Status 05/22/2017 FINAL  Final     Radiological Exams on Admission: No results found.  EKG: Independently reviewed.  Assessment/Plan Active Problems:   Gait instability   Frequent falls   Depression   Failure to thrive (0-17)   Pulsatile abdominal mass   Anemia   Essential hypertension, benign   Failure to thrive, gait instability,  frequent falls, unresolved from previous admission.  Etiology unclear , including dehydration due to depression, versus malignancy   CT head and MRI brain neg, Bilateral carotid ultrasound with minimal stenosis . Last 2 D echo EF 55%. TSH 1.696. Other recent hospital workup is negative. He did have some imaging studies as OP and mentions a recent finding of a lung mass   Tele obs  IV Fluids 1L NS bolus and IV NS at 125 cc/h  Check orthostatics   PT/OT nutrition consult  Check PAB  Check UA  CT Chest, abdomen and pelvis with contrast to rule out malignancy and to evaluate for abdominal pulsatile mass (r/o AAA)  If no malignancy is seen, then will need Psych evaluation  Meclizine for dizziness  Antiemetics for nausea Start Remeron 7.5 mg to help increase his appetite    Anemia of chronic disease Hemoglobin on admission 10.9 in the setting of poor nutritional status. No bleeding issues noted  Repeat CBC in am  No transfusion is indicated at this time CHeck anemia panel  Continue Iron supplements    Hypertension BP  169/75   Pulse 63   Controlled Continue home anti-hypertensive medications     Depression with recent increase of Lexapro from 10 to 20 mg  Continue home Lexapro 20 mg daily . May need to increase his dose as it does not appear to be therapeutic      DVT prophylaxis: SCD Code Status:   Full after conversation with patient and family Family Communication:  Discussed with patient and family Disposition Plan: Expect patient to be discharged to home after condition improves Consults called:  Nutrition Admission status:Tele  Obs   Shirly Bartosiewicz E, PA-C [facility]   05/25/2017, 2:59 PM

## 2017-05-26 DIAGNOSIS — F329 Major depressive disorder, single episode, unspecified: Secondary | ICD-10-CM | POA: Diagnosis present

## 2017-05-26 DIAGNOSIS — I10 Essential (primary) hypertension: Secondary | ICD-10-CM

## 2017-05-26 DIAGNOSIS — R296 Repeated falls: Secondary | ICD-10-CM | POA: Diagnosis present

## 2017-05-26 DIAGNOSIS — R627 Adult failure to thrive: Secondary | ICD-10-CM | POA: Diagnosis not present

## 2017-05-26 DIAGNOSIS — R5381 Other malaise: Secondary | ICD-10-CM | POA: Diagnosis present

## 2017-05-26 DIAGNOSIS — R2681 Unsteadiness on feet: Secondary | ICD-10-CM | POA: Diagnosis present

## 2017-05-26 DIAGNOSIS — Z87891 Personal history of nicotine dependence: Secondary | ICD-10-CM | POA: Diagnosis not present

## 2017-05-26 DIAGNOSIS — I951 Orthostatic hypotension: Secondary | ICD-10-CM | POA: Diagnosis present

## 2017-05-26 DIAGNOSIS — D638 Anemia in other chronic diseases classified elsewhere: Secondary | ICD-10-CM | POA: Diagnosis present

## 2017-05-26 DIAGNOSIS — D509 Iron deficiency anemia, unspecified: Secondary | ICD-10-CM | POA: Diagnosis present

## 2017-05-26 DIAGNOSIS — E278 Other specified disorders of adrenal gland: Secondary | ICD-10-CM | POA: Diagnosis present

## 2017-05-26 DIAGNOSIS — Z6823 Body mass index (BMI) 23.0-23.9, adult: Secondary | ICD-10-CM | POA: Diagnosis not present

## 2017-05-26 DIAGNOSIS — R1900 Intra-abdominal and pelvic swelling, mass and lump, unspecified site: Secondary | ICD-10-CM

## 2017-05-26 DIAGNOSIS — Z7982 Long term (current) use of aspirin: Secondary | ICD-10-CM | POA: Diagnosis not present

## 2017-05-26 DIAGNOSIS — E785 Hyperlipidemia, unspecified: Secondary | ICD-10-CM | POA: Diagnosis present

## 2017-05-26 DIAGNOSIS — Z79899 Other long term (current) drug therapy: Secondary | ICD-10-CM | POA: Diagnosis not present

## 2017-05-26 DIAGNOSIS — E43 Unspecified severe protein-calorie malnutrition: Secondary | ICD-10-CM | POA: Diagnosis present

## 2017-05-26 LAB — CBC
HEMATOCRIT: 28.9 % — AB (ref 39.0–52.0)
Hemoglobin: 9.3 g/dL — ABNORMAL LOW (ref 13.0–17.0)
MCH: 27.9 pg (ref 26.0–34.0)
MCHC: 32.2 g/dL (ref 30.0–36.0)
MCV: 86.8 fL (ref 78.0–100.0)
PLATELETS: 200 10*3/uL (ref 150–400)
RBC: 3.33 MIL/uL — AB (ref 4.22–5.81)
RDW: 12.6 % (ref 11.5–15.5)
WBC: 5.1 10*3/uL (ref 4.0–10.5)

## 2017-05-26 LAB — COMPREHENSIVE METABOLIC PANEL
ALT: 14 U/L — ABNORMAL LOW (ref 17–63)
ANION GAP: 5 (ref 5–15)
AST: 12 U/L — ABNORMAL LOW (ref 15–41)
Albumin: 3.1 g/dL — ABNORMAL LOW (ref 3.5–5.0)
Alkaline Phosphatase: 34 U/L — ABNORMAL LOW (ref 38–126)
BUN: 18 mg/dL (ref 6–20)
CHLORIDE: 106 mmol/L (ref 101–111)
CO2: 26 mmol/L (ref 22–32)
CREATININE: 1.11 mg/dL (ref 0.61–1.24)
Calcium: 8.6 mg/dL — ABNORMAL LOW (ref 8.9–10.3)
Glucose, Bld: 103 mg/dL — ABNORMAL HIGH (ref 65–99)
POTASSIUM: 4 mmol/L (ref 3.5–5.1)
Sodium: 137 mmol/L (ref 135–145)
Total Bilirubin: 0.4 mg/dL (ref 0.3–1.2)
Total Protein: 5.1 g/dL — ABNORMAL LOW (ref 6.5–8.1)

## 2017-05-26 LAB — TROPONIN I
Troponin I: <0.03 ng/mL (ref <0.03)
Troponin I: <0.03 ng/mL (ref <0.03)

## 2017-05-26 MED ORDER — TRAMADOL HCL 50 MG PO TABS: 50.0000 mg | ORAL_TABLET | Freq: Four times a day (QID) | ORAL | Status: DC | PRN

## 2017-05-26 MED ORDER — GI COCKTAIL ~~LOC~~
30.0000 mL | Freq: Three times a day (TID) | ORAL | Status: DC | PRN
  Filled 2017-05-26: qty 30

## 2017-05-26 MED ORDER — ENSURE ENLIVE PO LIQD
237.0000 mL | Freq: Three times a day (TID) | ORAL | Status: DC
  Administered 2017-05-26 – 2017-05-28 (×6): 237 mL via ORAL

## 2017-05-26 MED ORDER — WHITE PETROLATUM GEL
Status: AC
  Filled 2017-05-26: qty 1

## 2017-05-26 MED ORDER — FERROUS SULFATE 325 (65 FE) MG PO TABS
325.0000 mg | ORAL_TABLET | Freq: Two times a day (BID) | ORAL | Status: DC
  Administered 2017-05-26 – 2017-05-28 (×4): 325 mg via ORAL
  Filled 2017-05-26 (×4): qty 1

## 2017-05-26 NOTE — Progress Notes (Signed)
Initial Nutrition Assessment  DOCUMENTATION CODES:   Non-severe (moderate) malnutrition in context of social or environmental circumstances  INTERVENTION:  Ensure Enlive po TID, each supplement provides 350 kcal and 20 grams of protein  Provided education on the importance of high protein high calorie beverages at home. Will include in discharge instructions.    NUTRITION DIAGNOSIS:   Malnutrition (Moderate) related to social / environmental circumstances (pyschosis/depression) as evidenced by moderate depletion of body fat, moderate depletions of muscle mass, wt loss of 12% in 4 months.  GOAL:   Patient will meet greater than or equal to 90% of their needs  MONITOR:   PO intake, Supplement acceptance, Weight trends  REASON FOR ASSESSMENT:   Malnutrition Screening Tool    ASSESSMENT:   Pt with PMH of HTN , falls, and depression. Admitted at [facility] April 2018 for new pyschosi and seen 6/30 at [facility] for dizziness and poor oral intake due to depression. Presented this admission with gait instability and dizziness.     Consulted for FTT symptoms. Pt currently on regular diet consuming, 50% of his last meal. Reports nausea eating breakfast this morning that has since resolved at lunch. When asked about appetite at home, pt reports a loss of appetite for 2-3 months due to depression. Typically eating a couple of times a day, but could not give examples of what his meals look like.   Pt reports a wt loss of 20 lbs in the last 2-3 months due to lack of PO intake. States his UBW stays around 145 lbs, last time at that wt being March 2018. Records indicate a wt of 128 lbs this admission, a 12 % wt loss from his UBW in 4 months, which is a significant time frame.   Pt meets requirements for moderate malnutrition with < 10% in 6 months. Suspect pt meets decreased energy intake requirement, but more specfics are needed in regard to intake. Nutrition-Focused physical exam completed.  Findings are moderate fat depletion, moderate muscle depletion, and no edema.   Medications reviewed and include: NS @ 125 ml/hr, ferrous sulfate  Labs reviewed: CBG 116, 103  Diet Order:  Diet regular Room service appropriate? Yes; Fluid consistency: Thin  Skin:  Reviewed, no issues  Last BM:  05/25/2017  Height:   Ht Readings from Last 1 Encounters:  05/25/17 5\' 7"  (1.702 m)    Weight:   Wt Readings from Last 1 Encounters:  05/26/17 147 lb 1.6 oz (66.7 kg)  Current charted wt appears inaccurate.   Ideal Body Weight:  67.3 kg  BMI:  Body mass index is 23.04 kg/m.  Estimated Nutritional Needs:   Kcal:  1700-1900  Protein:  85-95 grams  Fluid:  > 1.7 L/day  EDUCATION NEEDS:   Education needs addressed  Mariana Single RD, LDN 05/26/2017 3:14 PM

## 2017-05-26 NOTE — Progress Notes (Signed)
Occupational Therapy Evaluation Patient Details Name: Devin Valdez MRN: [id number] DOB: 11/16/1941 Today's Date: 05/26/2017    History of Present Illness Pt is a 76 yo male who was admitted through ED on 05/20/17 after having multiple falls and increased weakness. PMH significant for HTN, Depression. MRI - for CVA.    Clinical Impression   PTA, pt lived at home with wife. Prior to recent hospital admission, pt activem independent with mobility and ADL without an AD, driving and doing yardwork. Pt states he has had 2 falls in the past week and his legs just "give out". Pt very flat throughout assessment, deferring some answers to wife. Pt states his sister passed away in 01/28/2017. Pt required min A with mobility using a RW and experienced 1 LOB during mobility. Slow processing noted and pt stated " I get more frustrated than usual because I just can't do what I want to do; I have a hard time concentrating". Pt complained of dizziness sitting EOB, but resolved once standing. BP supine 123/57; sitting 121/63; standing 116/60. Recommend speech for cognitive eval, PT vestibular eval and psych consult. Will follow acutely. If pt improves with functional mobility, most likely will be able to DC home with S for all mobility. If pt continues to have difficulty with mobility, he may benefit form short rehab stay at [facility]. Discussed with pt/wife and they prefer [facility] services if possible.     Follow Up Recommendations       Equipment Recommendations  Other (comment) (RW)    Recommendations for Other Services Speech consult (cognitive eval)  Psych consult PT vestibular eval     Precautions / Restrictions Precautions Precautions: Fall Restrictions Weight Bearing Restrictions: No      Mobility Bed Mobility Overal bed mobility: Needs Assistance Bed Mobility: Supine to Sit     Supine to sit: Supervision     General bed mobility comments: Once EOB, pt occasionally loses balance  posteriorly  Transfers Overall transfer level: Needs assistance Equipment used: Rolling walker (2 wheeled) Transfers: Sit to/from Stand Sit to Stand: Supervision         General transfer comment: supervision for safety    Balance Overall balance assessment: Needs assistance Sitting-balance support: No upper extremity supported;Feet supported Sitting balance-Leahy Scale: Normal     Standing balance support: No upper extremity supported Standing balance-Leahy Scale: Poor                             ADL either performed or assessed with clinical judgement   ADL Overall ADL's : Needs assistance/impaired     Grooming: Standing;Min guard   Upper Body Bathing: Set up;Sitting   Lower Body Bathing: Min guard;Sit to/from stand   Upper Body Dressing : Set up;Sitting   Lower Body Dressing: Min guard;Sit to/from stand   Toilet Transfer: Minimal assistance;RW   Toileting- Clothing Manipulation and Hygiene: Minimal assistance;Sit to/from stand       Functional mobility during ADLs: Minimal assistance;Rolling walker;Cueing for safety;Moderate assistance (mod A to prevent fall) General ADL Comments: Pt with slow processing during ADL, but able to complete tasks     Vision Baseline Vision/History: Wears glasses Wears Glasses: Reading only Additional Comments: will further assess. Pt reports no changes     Perception     Praxis      Pertinent Vitals/Pain Pain Assessment: No/denies pain     Hand Dominance Left   Extremity/Trunk Assessment Upper Extremity Assessment Upper Extremity  Assessment: Generalized weakness   Lower Extremity Assessment Lower Extremity Assessment: Generalized weakness   Cervical / Trunk Assessment Cervical / Trunk Assessment: Normal   Communication Communication Communication: No difficulties   Cognition Arousal/Alertness: Awake/alert Behavior During Therapy: Flat affect Overall Cognitive Status: Within Functional Limits for  tasks assessed                                     General Comments       Exercises     Shoulder Instructions      Home Living Family/patient expects to be discharged to:: Private residence Living Arrangements: Spouse/significant other Available Help at Discharge: Family;Available 24 hours/day Type of Home: House Home Access: Stairs to enter [employer] of Steps: 2 Entrance Stairs-Rails: None Home Layout: Multi-level Alternate Level Stairs-Number of Steps: 7 & 6 (rail on R) Alternate Level Stairs-Rails: Right Bathroom Shower/Tub: Occupational psychologist: Standard Bathroom Accessibility: Yes How Accessible: Accessible via walker Home Equipment: Shower seat          Prior Functioning/Environment Level of Independence: Independent        Comments: walking withough AD, driving and doing for himself prior to admission        OT Problem List: Decreased strength;Decreased activity tolerance;Impaired balance (sitting and/or standing);Decreased safety awareness;Decreased knowledge of use of DME or AE      OT Treatment/Interventions: Self-care/ADL training;Therapeutic exercise;Energy conservation;DME and/or AE instruction;Therapeutic activities;Cognitive remediation/compensation;Patient/family education;Balance training    OT Goals(Current goals can be found in the care plan section) Acute Rehab OT Goals Patient Stated Goal: to get stronger OT Goal Formulation: With patient/family Time For Goal Achievement: 06/09/17 Potential to Achieve Goals: Good ADL Goals Pt Will Perform Lower Body Bathing: with supervision;with caregiver independent in assisting;sit to/from stand Pt Will Perform Lower Body Dressing: with supervision;with caregiver independent in assisting;sit to/from stand Pt Will Transfer to Toilet: with supervision;ambulating (with caregiver independent in assisting) Pt Will Perform Toileting - Clothing Manipulation and hygiene:  with modified independence;sit to/from stand Pt Will Perform Tub/Shower Transfer: with supervision;with caregiver independent in assisting;ambulating;shower seat;Shower transfer;rolling walker Additional ADL Goal #1: PT/wife will independently verbalize 3 strategies to reduce risk of falls.  OT Frequency: Min 2X/week   Barriers to D/C:            Co-evaluation              AM-PAC PT "6 Clicks" Daily Activity     Outcome Measure Help from another person eating meals?: None Help from another person taking care of personal grooming?: A Little Help from another person toileting, which includes using toliet, bedpan, or urinal?: A Little Help from another person bathing (including washing, rinsing, drying)?: A Little Help from another person to put on and taking off regular upper body clothing?: None Help from another person to put on and taking off regular lower body clothing?: A Little 6 Click Score: 20   End of Session Equipment Utilized During Treatment: Gait belt;Rolling walker Nurse Communication: Mobility status  Activity Tolerance: Patient tolerated treatment well Patient left: in chair;with call bell/phone within reach;with chair alarm set  OT Visit Diagnosis: Unsteadiness on feet (R26.81);Muscle weakness (generalized) (M62.81);Other symptoms and signs involving cognitive function                Time: 1009-1040 OT Time Calculation (min): 31 min Charges:  OT General Charges $OT Visit: 1 Procedure OT Evaluation $OT Eval  Moderate Complexity: 1 Procedure OT Treatments $Self Care/Home Management : 8-22 mins G-Codes: OT G-codes **NOT FOR INPATIENT CLASS** Functional Assessment Tool Used: Clinical judgement Functional Limitation: Self care Self Care Current Status (H4174): At least 20 percent but less than 40 percent impaired, limited or restricted Self Care Goal Status (Y8144): At least 1 percent but less than 20 percent impaired, limited or restricted   [facility],  OT/L  [phone] 05/26/2017  Jayana Kotula,HILLARY 05/26/2017, 10:54 AM

## 2017-05-26 NOTE — Evaluation (Signed)
Physical Therapy Evaluation and Discharge Patient Details Name: Devin Valdez MRN: [id number] DOB: 1941/03/07 Today's Date: 05/26/2017   History of Present Illness  76 y.o. male with a Past Medical History significant for hypertension, depression and hyperlipidemia who presents with failure to thrive.. Pt with complaints of complaints of gait instability, frequents falls. Pt orthostatic in neurologist's office and was sent to [facility].   Clinical Impression  Pt presented supine in bed with HOB elevated, awake and willing to participate in therapy session. Prior to admission, pt reported that he was independent with all functional mobility but recent falls in which he was "over-doing it". Pt ambulated in hallway with supervision without an AD, mild instability but no overt LOB or need for physical assistance. Pt also performing stair training this session with supervision. No further acute PT needs identified at this time. PT signing off.    Follow Up Recommendations Home health PT    Equipment Recommendations  None recommended by PT    Recommendations for Other Services       Precautions / Restrictions Precautions Precautions: Fall Restrictions Weight Bearing Restrictions: No      Mobility  Bed Mobility Overal bed mobility: Modified Independent                Transfers Overall transfer level: Needs assistance Equipment used: None Transfers: Sit to/from Stand Sit to Stand: Supervision         General transfer comment: supervision for safety  Ambulation/Gait Ambulation/Gait assistance: Supervision Ambulation Distance (Feet): 200 Feet Assistive device: None Gait Pattern/deviations: Step-through pattern;Decreased step length - right;Decreased step length - left Gait velocity: decreased Gait velocity interpretation: Below normal speed for age/gender General Gait Details: slow, steady gait without use of an AD, supervision for safety  Stairs Stairs: Yes Stairs  assistance: Min guard Stair Management: Two rails;Alternating pattern;Forwards Number of Stairs: 2    Wheelchair Mobility    Modified Rankin (Stroke Patients Only)       Balance Overall balance assessment: Needs assistance Sitting-balance support: No upper extremity supported;Feet supported Sitting balance-Leahy Scale: Normal     Standing balance support: No upper extremity supported Standing balance-Leahy Scale: Fair                               Pertinent Vitals/Pain Pain Assessment: No/denies pain    Home Living Family/patient expects to be discharged to:: Private residence Living Arrangements: Spouse/significant other Available Help at Discharge: Family;Available 24 hours/day Type of Home: House Home Access: Stairs to enter Entrance Stairs-Rails: None Entrance Stairs-Number of Steps: 2 Home Layout: Multi-level Home Equipment: Shower seat      Prior Function Level of Independence: Independent               Hand Dominance        Extremity/Trunk Assessment   Upper Extremity Assessment Upper Extremity Assessment: Generalized weakness    Lower Extremity Assessment Lower Extremity Assessment: Generalized weakness    Cervical / Trunk Assessment Cervical / Trunk Assessment: Normal  Communication   Communication: No difficulties  Cognition Arousal/Alertness: Awake/alert Behavior During Therapy: Flat affect (tearful) Overall Cognitive Status: Within Functional Limits for tasks assessed                                        General Comments      Exercises  Assessment/Plan    PT Assessment Patent does not need any further PT services;All further PT needs can be met in the next venue of care  PT Problem List Decreased strength;Decreased balance;Decreased mobility;Decreased coordination       PT Treatment Interventions      PT Goals (Current goals can be found in the Care Plan section)  Acute Rehab PT  Goals Patient Stated Goal: to get stronger    Frequency     Barriers to discharge        Co-evaluation               AM-PAC PT "6 Clicks" Daily Activity  Outcome Measure Difficulty turning over in bed (including adjusting bedclothes, sheets and blankets)?: None Difficulty moving from lying on back to sitting on the side of the bed? : None Difficulty sitting down on and standing up from a chair with arms (e.g., wheelchair, bedside commode, etc,.)?: A Little Help needed moving to and from a bed to chair (including a wheelchair)?: A Little Help needed walking in hospital room?: A Little Help needed climbing 3-5 steps with a railing? : A Little 6 Click Score: 20    End of Session Equipment Utilized During Treatment:  (pt refusing gait belt) Activity Tolerance: Patient tolerated treatment well Patient left: in bed;with call bell/phone within reach;with bed alarm set;with family/visitor present (sitting EOB) Nurse Communication: Mobility status PT Visit Diagnosis: Other abnormalities of gait and mobility (R26.89)    Time: 1355-1418 PT Time Calculation (min) (ACUTE ONLY): 23 min   Charges:   PT Evaluation $PT Eval Moderate Complexity: 1 Procedure PT Treatments $Gait Training: 8-22 mins   PT G Codes:   PT G-Codes **NOT FOR INPATIENT CLASS** Functional Assessment Tool Used: AM-PAC 6 Clicks Basic Mobility;Clinical judgement Functional Limitation: Mobility: Walking and moving around Mobility: Walking and Moving Around Current Status (K9326): At least 20 percent but less than 40 percent impaired, limited or restricted Mobility: Walking and Moving Around Goal Status [phone]): 0 percent impaired, limited or restricted Mobility: Walking and Moving Around Discharge Status [phone]): At least 20 percent but less than 40 percent impaired, limited or restricted    [facility], [place], DPT [place] 05/26/2017, 2:28 PM

## 2017-05-26 NOTE — Progress Notes (Addendum)
Patient ID: Devin Valdez, male   DOB: [birth year], 76 y.o.   MRN: [id number]    PROGRESS NOTE    Devin Valdez  CBS:[id number] DOB: [birth year] DOA: 05/25/2017  PCP: [name], MD   Brief Narrative:  Pt is 76 yo male with known HTN, HLD, progressive failure to thrive and with hospitalizations for this, most recent discharge on July 1st, 2018. He has presented now with progressive weakness, gait instability, frequent falls, persistent failure to thrive (lost 25 lbs since April 2018). Pt was not to be orthostatic on prior admission and had work up done including CT and MRI brain, carotid dopplers, ECHO, all of which were negative. Pt apparently also had a work up done at [facility] and was told he has lung mass but no specific further work up done.   Assessment & Plan:      Failure to thrive, gait instability, frequent falls, unresolved from previous admission - in the setting of severe orthostatic hypotension  - also poor oral intake and progressive weakness - PT eval requested - no evidence of malignancy on CT abd, chest     Dizziness, orthostatic hypotension  - has seen Dr. Leonie Man in the office 05/25/2017 and was severely orthostatic with BP drop from 142/79 --> 103/63 - this is recurrent problem and work has been done for this in the past at [place]  - PT/OT eval requested, keep on IVF for now    Iron deficiency anemia - with Iron level 28 - no evidence of active bleeding - will place on iron supplementation - check FOBT    Chest pain  - suspect acid reflux but not very clear - will ask for 12 lead EKG, cycle troponins - add GI cocktail     Left adrenal nodule  - outpatient work up    HTN, essential - reasonable inpatient control for now    Depression  - on Lexapro 20 mg PO QD    Severe PCM - nutritionist consulted   DVT prophylaxis: SCD's Code Status: Full  Family Communication: Patient at bedside  Disposition Plan: to be determined   Consultants:    PT/OT  Nutritionist   Procedures:   None  Antimicrobials:   None  Subjective: Has chest pain this AM that he noted 30 minutes after eating breakfast.   Objective: Vitals:   05/26/17 0107 05/26/17 0432 05/26/17 0613 05/26/17 0931  BP: (!) 134/55 (!) 121/59  (!) 116/50  Pulse: 64 60  92  Resp: 18 18  18   Temp: 98.5 F (36.9 C) 98.9 F (37.2 C)  99 F (37.2 C)  TempSrc: Oral Oral  Oral  SpO2: 100% 98%  100%  Weight:   66.7 kg (147 lb 1.6 oz)   Height:        Intake/Output Summary (Last 24 hours) at 05/26/17 1308 Last data filed at 05/26/17 1102  Gross per 24 hour  Intake          1265.83 ml  Output                2 ml  Net          1263.83 ml   Filed Weights   05/25/17 1500 05/26/17 0613  Weight: 58.2 kg (128 lb 4.8 oz) 66.7 kg (147 lb 1.6 oz)    Examination:  General exam: Appears calm and comfortable, frail and chronically ill Respiratory system: Clear to auscultation. Respiratory effort normal. Cardiovascular system: RRR. No JVD, murmurs, rubs, gallops  or clicks. No pedal edema. Gastrointestinal system: Abdomen is nondistended, soft and nontender. No organomegaly or masses felt. Central nervous system: Alert and oriented. No focal neurological deficits.  Data Reviewed: I have personally reviewed following labs and imaging studies  CBC:  Recent Labs Lab 05/20/17 0918 05/20/17 1702 05/23/17 0944 05/26/17 0520  WBC 5.4 5.6 5.9 5.1  NEUTROABS 4.2  --   --   --   HGB 11.9* 11.1* 10.9* 9.3*  HCT 36.0* 34.3* 34.2* 28.9*  MCV 86.5 87.5 86.1 86.8  PLT 224 218 238 657   Basic Metabolic Panel:  Recent Labs Lab 05/20/17 0918 05/20/17 1702 05/23/17 0944 05/26/17 0520  NA 141  --  135 137  K 3.7  --  3.9 4.0  CL 102  --  101 106  CO2 29  --  23 26  GLUCOSE 106*  --  116* 103*  BUN 25*  --  13 18  CREATININE 1.08 1.07 0.98 1.11  CALCIUM 10.1  --  9.7 8.6*   Liver Function Tests:  Recent Labs Lab 05/20/17 0918 05/26/17 0520  AST 16 12*   ALT 17 14*  ALKPHOS 48 34*  BILITOT 0.9 0.4  PROT 7.5 5.1*  ALBUMIN 4.4 3.1*   Cardiac Enzymes:  Recent Labs Lab 05/20/17 0918 05/26/17 1042  TROPONINI <0.03 <0.03   Anemia Panel:  Recent Labs  05/25/17 1505  VITAMINB12 667  FOLATE 30.6  FERRITIN 215  TIBC 273  IRON 28*  RETICCTPCT 2.8   Urine analysis:    Component Value Date/Time   COLORURINE YELLOW 05/23/2017 1225   APPEARANCEUR CLEAR 05/23/2017 1225   LABSPEC 1.012 05/23/2017 1225   PHURINE 5.0 05/23/2017 1225   GLUCOSEU NEGATIVE 05/23/2017 1225   HGBUR NEGATIVE 05/23/2017 1225   BILIRUBINUR NEGATIVE 05/23/2017 1225   KETONESUR NEGATIVE 05/23/2017 1225   PROTEINUR NEGATIVE 05/23/2017 1225   UROBILINOGEN 0.2 05/14/2017 1256   NITRITE NEGATIVE 05/23/2017 1225   LEUKOCYTESUR NEGATIVE 05/23/2017 1225    Recent Results (from the past 240 hour(s))  Urine culture     Status: None   Collection Time: 05/20/17  9:18 AM  Result Value Ref Range Status   Specimen Description URINE, CLEAN CATCH  Final   Special Requests NONE  Final   Culture   Final    NO GROWTH Performed at [facility], [place] [address], [place], [place] [address]    Report Status 05/22/2017 FINAL  Final    Radiology Studies: Ct Chest W Contrast Ct Abdomen Pelvis W Contrast Result Date: 05/25/2017 1. Aortoiliac and branch vessel atherosclerosis without aneurysm or dissection.  2. Centrilobular emphysema of the lungs .  3. 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  4. Markedly atrophic appearance of the left kidney with compensatory hypertrophy of the right kidney.  5. Indeterminate left adrenal nodule measuring 2.5 x 1.7 cm. This can be further worked up with dedicated CT or MRI with adrenal protocol as clinically necessary.  6. Uncomplicated cholelithiasis.  7. Subcentimeter right hepatic hypodensity too small to further characterize but statistically consistent with a cyst or hemangioma.  8. Thoracolumbar  spondylosis.   Scheduled Meds: . aspirin EC  81 mg Oral Daily  . hydrochlorothiazide  12.5 mg Oral Daily  . losartan  50 mg Oral Daily  . mirtazapine  7.5 mg Oral QHS  . sodium chloride flush  3 mL Intravenous Q12H  . white petrolatum       Continuous Infusions: . sodium chloride 125 mL/hr at  05/26/17 1102     LOS: 1 day   Time spent: 35 minutes   Faye Ramsay, MD [facility] Pager [phone]  If 7PM-7AM, please contact night-coverage [email or web address] Password [email or web address] 05/26/2017, 1:08 PM

## 2017-05-27 LAB — BASIC METABOLIC PANEL
Anion gap: 6 (ref 5–15)
BUN: 20 mg/dL (ref 6–20)
CALCIUM: 8.6 mg/dL — AB (ref 8.9–10.3)
CHLORIDE: 106 mmol/L (ref 101–111)
CO2: 25 mmol/L (ref 22–32)
Creatinine, Ser: 1.09 mg/dL (ref 0.61–1.24)
GFR calc Af Amer: >60 mL/min (ref >60)
GFR calc non Af Amer: >60 mL/min (ref >60)
GLUCOSE: 105 mg/dL — AB (ref 65–99)
Potassium: 4.1 mmol/L (ref 3.5–5.1)
Sodium: 137 mmol/L (ref 135–145)

## 2017-05-27 LAB — CBC
HEMATOCRIT: 28.2 % — AB (ref 39.0–52.0)
HEMOGLOBIN: 8.9 g/dL — AB (ref 13.0–17.0)
MCH: 27.8 pg (ref 26.0–34.0)
MCHC: 31.6 g/dL (ref 30.0–36.0)
MCV: 88.1 fL (ref 78.0–100.0)
Platelets: 188 10*3/uL (ref 150–400)
RBC: 3.2 MIL/uL — ABNORMAL LOW (ref 4.22–5.81)
RDW: 12.6 % (ref 11.5–15.5)
WBC: 5.8 10*3/uL (ref 4.0–10.5)

## 2017-05-27 MED ORDER — TRAMADOL HCL 50 MG PO TABS: 50.0000 mg | ORAL_TABLET | Freq: Four times a day (QID) | ORAL | 0 refills | Status: DC | PRN

## 2017-05-27 MED ORDER — GI COCKTAIL ~~LOC~~: 30.0000 mL | Freq: Three times a day (TID) | ORAL | 1 refills | Status: DC | PRN

## 2017-05-27 MED ORDER — FERROUS SULFATE 325 (65 FE) MG PO TABS: 325.0000 mg | ORAL_TABLET | Freq: Two times a day (BID) | ORAL | 1 refills | Status: DC

## 2017-05-27 NOTE — Discharge Instructions (Signed)
[facility] Stay Proper nutrition can help your body recover from illness and injury.   Foods and beverages high in protein, vitamins, and minerals help rebuild muscle loss, promote healing, & reduce fall risk.   In addition to eating healthy foods, a nutrition shake is an easy, delicious way to get the nutrition you need during and after your hospital stay  It is recommended that you continue to drink 2 bottles per day of:   Ensure Enlive for at least 1 month (30 days) after your hospital stay   Tips for adding a nutrition shake into your routine: As allowed, drink one with vitamins or medications instead of water or juice Enjoy one as a tasty mid-morning or afternoon snack Drink cold or make a milkshake out of it Drink one instead of milk with cereal or snacks Use as a coffee creamer   Available at the following grocery stores and pharmacies:           * [place] [phone]            For COUPONS visit: www.ensure.com/join or http://dawson-may.com/   Suggested Substitutions Ensure Plus = Boost Plus = Carnation Breakfast Essentials = Boost Compact Ensure Active Clear = Boost Breeze Glucerna Shake = Boost Glucose Control = Carnation Breakfast Essentials SUGAR FREE    Rehydration, Adult Rehydration is the replacement of body fluids and salts and minerals (electrolytes) that are lost during dehydration. Dehydration is when there is not enough fluid or water in the body. This happens when you lose more fluids than you take in. Common causes of dehydration include:  Vomiting.  Diarrhea.  Excessive sweating, such as from heat exposure or exercise.  Taking medicines that cause the body to lose excess fluid (diuretics).  Impaired kidney function.  Not drinking enough fluid.  Certain illnesses or  infections.  Certain poorly controlled long-term (chronic) illnesses, such as diabetes, heart disease, and kidney disease.  Symptoms of mild dehydration may include thirst, dry lips and mouth, dry skin, and dizziness. Symptoms of severe dehydration may include increased heart rate, confusion, fainting, and not urinating. You can rehydrate by drinking certain fluids or getting fluids through an IV tube, as told by your health care provider. What are the risks? Generally, rehydration is safe. However, one problem that can happen is taking in too much fluid (overhydration). This is rare. If overhydration happens, it can cause an electrolyte imbalance, kidney failure, or a decrease in salt (sodium) levels in the body. How to rehydrate Follow instructions from your health care provider for rehydration. The kind of fluid you should drink and the amount you should drink depend on your condition.  If directed by your health care provider, drink an oral rehydration solution (ORS). This is a drink designed to treat dehydration that is found in pharmacies and retail stores. ? Make an ORS by following instructions on the package. ? Start by drinking small amounts, about  cup (120 mL) every 5-10 minutes. ? Slowly increase how much you drink until you have taken the amount recommended by your health care provider.  Drink enough clear fluids to keep your urine clear or pale yellow. If you were instructed to drink an  ORS, finish the ORS first, then start slowly drinking other clear fluids. Drink fluids such as: ? Water. Do not drink only water. Doing that can lead to having too little sodium in your body (hyponatremia). ? Ice chips. ? Fruit juice that you have added water to (diluted juice). ? Low-calorie sports drinks.  If you are severely dehydrated, your health care provider may recommend that you receive fluids through an IV tube in the hospital.  Do not take sodium tablets. Doing that can lead to the  condition of having too much sodium in your body (hypernatremia).  Eating while you rehydrate Follow instructions from your health care provider about what to eat while you rehydrate. Your health care provider may recommend that you slowly begin eating regular foods in small amounts.  Eat foods that contain a healthy balance of electrolytes, such as bananas, oranges, potatoes, tomatoes, and spinach.  Avoid foods that are greasy or contain a lot of fat or sugar.  In some cases, you may get nutrition through a feeding tube that is passed through your nose and into your stomach (nasogastric tube, or NG tube). This may be done if you have uncontrolled vomiting or diarrhea. Beverages to avoid Certain beverages may make dehydration worse. While you rehydrate, avoid:  Alcohol.  Caffeine.  Drinks that contain a lot of sugar. These include: ? High-calorie sports drinks. ? Fruit juice that is not diluted. ? Soda.  Check nutrition labels to see how much sugar or caffeine a beverage contains. Signs of dehydration recovery You may be recovering from dehydration if:  You are urinating more often than before you started rehydrating.  Your urine is clear or pale yellow.  Your energy level improves.  You vomit less frequently.  You have diarrhea less frequently.  Your appetite improves or returns to normal.  You feel less dizzy or less light-headed.  Your skin tone and color start to look more normal.  Contact a health care provider if:  You continue to have symptoms of mild dehydration, such as: ? Thirst. ? Dry lips. ? Slightly dry mouth. ? Dry, warm skin. ? Dizziness.  You continue to vomit or have diarrhea. Get help right away if:  You have symptoms of dehydration that get worse.  You feel: ? Confused. ? Weak. ? Like you are going to faint.  You have not urinated in 6-8 hours.  You have very dark urine.  You have trouble breathing.  Your heart rate while sitting  still is over 100 beats a minute.  You cannot drink fluids without vomiting.  You have vomiting or diarrhea that: ? Gets worse. ? Does not go away.  You have a fever. This information is not intended to replace advice given to you by your health care provider. Make sure you discuss any questions you have with your health care provider. Document Released: 01/30/2012 Document Revised: 05/27/2016 Document Reviewed: 01/01/2016 Elsevier Interactive Patient Education  2018 [employer].  Dehydration, Adult Dehydration is when there is not enough fluid or water in your body. This happens when you lose more fluids than you take in. Dehydration can range from mild to very bad. It should be treated right away to keep it from getting very bad. Symptoms of mild dehydration may include:  Thirst.  Dry lips.  Slightly dry mouth.  Dry, warm skin.  Dizziness. Symptoms of moderate dehydration may include:  Very dry mouth.  Muscle cramps.  Dark pee (urine). Pee may be the color of tea.  Your body making less pee.  Your eyes making fewer tears.  Heartbeat that is uneven or faster than normal (palpitations).  Headache.  Light-headedness, especially when you stand up from sitting.  Fainting (syncope). Symptoms of very bad dehydration may include:  Changes in skin, such as: ? Cold and clammy skin. ? Blotchy (mottled) or pale skin. ? Skin that does not quickly return to normal after being lightly pinched and let go (poor skin turgor).  Changes in body fluids, such as: ? Feeling very thirsty. ? Your eyes making fewer tears. ? Not sweating when body temperature is high, such as in hot weather. ? Your body making very little pee.  Changes in vital signs, such as: ? Weak pulse. ? Pulse that is more than 100 beats a minute when you are sitting still. ? Fast breathing. ? Low blood pressure.  Other changes, such as: ? Sunken eyes. ? Cold hands and feet. ? Confusion. ? Lack of  energy (lethargy). ? Trouble waking up from sleep. ? Short-term weight loss. ? Unconsciousness. Follow these instructions at home:  If told by your doctor, drink an ORS: ? Make an ORS by using instructions on the package. ? Start by drinking small amounts, about  cup (120 mL) every 5-10 minutes. ? Slowly drink more until you have had the amount that your doctor said to have.  Drink enough clear fluid to keep your pee clear or pale yellow. If you were told to drink an ORS, finish the ORS first, then start slowly drinking clear fluids. Drink fluids such as: ? Water. Do not drink only water by itself. Doing that can make the salt (sodium) level in your body get too low (hyponatremia). ? Ice chips. ? Fruit juice that you have added water to (diluted). ? Low-calorie sports drinks.  Avoid: ? Alcohol. ? Drinks that have a lot of sugar. These include high-calorie sports drinks, fruit juice that does not have water added, and soda. ? Caffeine. ? Foods that are greasy or have a lot of fat or sugar.  Take over-the-counter and prescription medicines only as told by your doctor.  Do not take salt tablets. Doing that can make the salt level in your body get too high (hypernatremia).  Eat foods that have minerals (electrolytes). Examples include bananas, oranges, potatoes, tomatoes, and spinach.  Keep all follow-up visits as told by your doctor. This is important. Contact a doctor if:  You have belly (abdominal) pain that: ? Gets worse. ? Stays in one area (localizes).  You have a rash.  You have a stiff neck.  You get angry or annoyed more easily than normal (irritability).  You are more sleepy than normal.  You have a harder time waking up than normal.  You feel: ? Weak. ? Dizzy. ? Very thirsty.  You have peed (urinated) only a small amount of very dark pee during 6-8 hours. Get help right away if:  You have symptoms of very bad dehydration.  You cannot drink fluids  without throwing up (vomiting).  Your symptoms get worse with treatment.  You have a fever.  You have a very bad headache.  You are throwing up or having watery poop (diarrhea) and it: ? Gets worse. ? Does not go away.  You have blood or something green (bile) in your throw-up.  You have blood in your poop (stool). This may cause poop to look black and tarry.  You have not peed in 6-8 hours.  You pass out (faint).  Your heart rate when you are sitting still is more than 100 beats a minute.  You have trouble breathing. This information is not intended to replace advice given to you by your health care provider. Make sure you discuss any questions you have with your health care provider. Document Released: 09/03/2009 Document Revised: 05/27/2016 Document Reviewed: 01/01/2016 Elsevier Interactive Patient Education  2018 [employer].

## 2017-05-27 NOTE — Discharge Summary (Signed)
Physician Discharge Summary  Devin Valdez ZOX:[id number] DOB: [birth year] DOA: 05/25/2017  PCP: [name], MD  Admit date: 05/25/2017 Discharge date: 05/27/2017  Recommendations for Outpatient Follow-up:  1. Pt will need to follow up with PCP in 1-2 weeks post discharge 2. Please obtain BMP to evaluate electrolytes and kidney function 3. Please also check CBC to evaluate Hg and Hct levels  Discharge Diagnoses:  Active Problems:   Gait instability   Frequent falls   Depression   Failure to thrive (0-17)   Pulsatile abdominal mass   Anemia   Essential hypertension, benign   Failure to thrive in adult    Discharge Condition: Stable  Diet recommendation: Heart healthy diet discussed in details   Brief Narrative:  Pt is 76 yo male with known HTN, HLD, progressive failure to thrive and with hospitalizations for this, most recent discharge on July 1st, 2018. He has presented now with progressive weakness, gait instability, frequent falls, persistent failure to thrive (lost 25 lbs since April 2018). Pt was not to be orthostatic on prior admission and had work up done including CT and MRI brain, carotid dopplers, ECHO, all of which were negative. Pt apparently also had a work up done at [facility] and was told he has lung mass but no specific further work up done.   Assessment & Plan:      Failure to thrive, gait instability, frequent falls, unresolved from previous admission - in the setting of severe orthostatic hypotension  - reports feeling better but still with occasional unsteadiness  - will continue PT, continue IVF and pt does well, can be discharge in AM    Dizziness, orthostatic hypotension  - has seen Dr. Leonie Man in the office 05/25/2017 and was severely orthostatic with BP drop from 142/79 --> 103/63 - this is recurrent problem and work has been done for this in the past at [facility]  - keep on IVF and if pt does well, can be d/c home in AM - HH orders in place      Iron deficiency anemia - with Iron level 28 - no evidence of active bleeding - continue iron supplementation     Chest pain  - suspect acid reflux but not very clear - resolved with GI cocktail     Left adrenal nodule  - outpatient work up    HTN, essential - reasonable inpatient control for now    Depression  - on Lexapro 20 mg PO QD    Severe PCM - nutritionist consulted   DVT prophylaxis: SCD's Code Status: Full  Family Communication: Patient and wife at bedside  Disposition Plan: home   Consultants:   PT/OT  Nutritionist   Procedures:   None  Antimicrobials:   None  Procedures/Studies: Dg Chest 2 View  Result Date: 05/20/2017 CLINICAL DATA:  Unsteady gait today.  Weakness. EXAM: CHEST  2 VIEW COMPARISON:  01/22/2016 FINDINGS: Cardiac silhouette is normal in size. No mediastinal or hilar masses. No evidence of adenopathy. Clear lungs.  No pleural effusion or pneumothorax. Skeletal structures are intact. IMPRESSION: No active cardiopulmonary disease. Electronically Signed   By: Lajean Manes M.D.   On: 05/20/2017 09:42   Ct Head Wo Contrast  Result Date: 05/20/2017 CLINICAL DATA:  Wife reports that pt has been falling recently. More falls over the past 2 days Loss of balance and falls over. States he has poor po intake. Noted to be shuffling his feet. Denies pain. Wife reports started back on antidepressant  this week. EXAM: CT HEAD WITHOUT CONTRAST TECHNIQUE: Contiguous axial images were obtained from the base of the skull through the vertex without intravenous contrast. COMPARISON:  02/27/2017 FINDINGS: Brain: No evidence of acute infarction, hemorrhage, hydrocephalus, extra-axial collection or mass lesion/mass effect. There is age appropriate ventricular and sulcal enlargement stable from the prior study. Vascular: No hyperdense vessel or unexpected calcification. Skull: Normal. Negative for fracture or focal lesion. Sinuses/Orbits: Globes and orbits  are unremarkable. Visualized sinuses and mastoid air cells are clear. Other: None. IMPRESSION: 1. Normal unenhanced CT scan of the brain for age. No change from prior study. Electronically Signed   By: Lajean Manes M.D.   On: 05/20/2017 09:36   Ct Chest W Contrast  Result Date: 05/25/2017 CLINICAL DATA:  Failure to thrive, gait instability, abdominal pulsatile mass falx. Rule out abdominal aortic aneurysm. EXAM: CT CHEST, ABDOMEN, AND PELVIS WITH CONTRAST TECHNIQUE: Multidetector CT imaging of the chest, abdomen and pelvis was performed following the standard protocol during bolus administration of intravenous contrast. CONTRAST:  177mL ISOVUE-300 IOPAMIDOL (ISOVUE-300) INJECTION 61% COMPARISON:  Report of an ultrasound of the abdomen from 06/18/2002 FINDINGS: CT CHEST FINDINGS Cardiovascular: Normal 3 vessel takeoff of the great vessels. Thoracic aorta is atherosclerotic. No aneurysm of the thoracic aorta. Heart is top-normal in size. No pericardial effusion is noted. Three-vessel coronary arteriosclerosis is identified. No large central pulmonary embolus. Mediastinum/Nodes: The thyroid gland is not enlarged. No mediastinal or hilar adenopathy. The trachea and mainstem bronchi are patent. The CT appearance of the esophagus is unremarkable. Lungs/Pleura: Mild centrilobular emphysema of the lungs. Dependent atelectasis is noted at each lung base. No dominant mass, pneumonic consolidation, effusion or pneumothorax. Musculoskeletal: Thoracolumbar spondylosis with diffuse idiopathic skeletal hyperostosis along the mid to lower thoracic spine characterized by flowing anterior osteophytes. No acute nor suspicious osseous lesions. CT ABDOMEN PELVIS FINDINGS Hepatobiliary: Uncomplicated cholelithiasis. Nondistended gallbladder. 9 mm hypodensity along the periphery of the right hepatic lobe, series 3, image 40 may represent a small cyst or hemangioma but is too small to further characterize. No biliary dilatation.  Pancreas: Normal Spleen: Normal in size without focal abnormality. Adrenals/Urinary Tract: Nonspecific 2.5 x 1.7 cm left adrenal hypodense nodule with Hounsfield unit of 62, indeterminate for a benign adenoma. The right adrenal gland is normal. What appears to be the left kidney is markedly atrophic in appearance with compensatory hypertrophy of the right kidney. No nephrolithiasis nor obstructive uropathy is seen of the right kidney. No enhancing renal mass is noted. The bladder is physiologically distended. No mass or calculus is noted within. Stomach/Bowel: The stomach is distended with food. There is normal small bowel rotation without obstruction or inflammation. Contrast reaches the large intestine. There is moderate amount fecal residue within the distal transverse and proximal descending colon. Vascular/Lymphatic: Aortoiliac and branch vessel atherosclerosis without aneurysm or dissection is noted. Reproductive: Normal size prostate and seminal vesicles. Calcifications are noted along the corpora cavernosa and spongiosum of the penis. Other: No abdominal wall hernia or abnormality. No abdominopelvic ascites. Musculoskeletal: Thoracolumbar spondylosis. IMPRESSION: 1. Aortoiliac and branch vessel atherosclerosis without aneurysm or dissection. 2. Centrilobular emphysema of the lungs . 3. 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 4. Markedly atrophic appearance of the left kidney with compensatory hypertrophy of the right kidney. 5. Indeterminate left adrenal nodule measuring 2.5 x 1.7 cm. This can be further worked up with dedicated CT or MRI with adrenal protocol as clinically necessary. 6. Uncomplicated cholelithiasis. 7. Subcentimeter right hepatic hypodensity too small to further characterize but  statistically consistent with a cyst or hemangioma. 8. Thoracolumbar spondylosis. Electronically Signed   By: Ashley Royalty M.D.   On: 05/25/2017 17:59   Mr Brain Wo Contrast  Result Date:  05/21/2017 CLINICAL DATA:  Frequent falls with gait instability and dizziness. Symptoms began acutely yesterday. EXAM: MRI HEAD WITHOUT CONTRAST MRA HEAD WITHOUT CONTRAST TECHNIQUE: Multiplanar, multiecho pulse sequences of the brain and surrounding structures were obtained without intravenous contrast. Angiographic images of the head were obtained using MRA technique without contrast. COMPARISON:  CT 05/20/2017 and 02/27/2017 FINDINGS: MRI HEAD FINDINGS Brain: Diffusion imaging does not show any acute or subacute infarction. Mild chronic small-vessel ischemic changes are present affecting the pons in the cerebral hemispheric deep and subcortical white matter. No cortical or large vessel territory infarction. No mass lesion, hemorrhage, hydrocephalus or extra-axial collection. Vascular: Major vessels at the base of the brain show flow. Skull and upper cervical spine: Negative Sinuses/Orbits: Clear/normal Other: None significant MRA HEAD FINDINGS Both internal carotid arteries are patent through the skullbase and siphon regions. There is motion degradation in the siphon region. Anterior and middle cerebral vessels are patent without proximal stenosis, aneurysm or vascular malformation. Both vertebral arteries are patent to the basilar. No basilar stenosis. Posterior circulation branch vessels appear normal. IMPRESSION: No acute finding by MRI. 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 Negative intracranial MR angiography of the large and medium size vessels. Some motion degradation. Electronically Signed   By: Nelson Chimes M.D.   On: 05/21/2017 09:20   Ct Abdomen Pelvis W Contrast  Result Date: 05/25/2017 CLINICAL DATA:  Failure to thrive, gait instability, abdominal pulsatile mass falx. Rule out abdominal aortic aneurysm. EXAM: CT CHEST, ABDOMEN, AND PELVIS WITH CONTRAST TECHNIQUE: Multidetector CT imaging of the chest, abdomen and pelvis was performed following  the standard protocol during bolus administration of intravenous contrast. CONTRAST:  159mL ISOVUE-300 IOPAMIDOL (ISOVUE-300) INJECTION 61% COMPARISON:  Report of an ultrasound of the abdomen from 06/18/2002 FINDINGS: CT CHEST FINDINGS Cardiovascular: Normal 3 vessel takeoff of the great vessels. Thoracic aorta is atherosclerotic. No aneurysm of the thoracic aorta. Heart is top-normal in size. No pericardial effusion is noted. Three-vessel coronary arteriosclerosis is identified. No large central pulmonary embolus. Mediastinum/Nodes: The thyroid gland is not enlarged. No mediastinal or hilar adenopathy. The trachea and mainstem bronchi are patent. The CT appearance of the esophagus is unremarkable. Lungs/Pleura: Mild centrilobular emphysema of the lungs. Dependent atelectasis is noted at each lung base. No dominant mass, pneumonic consolidation, effusion or pneumothorax. Musculoskeletal: Thoracolumbar spondylosis with diffuse idiopathic skeletal hyperostosis along the mid to lower thoracic spine characterized by flowing anterior osteophytes. No acute nor suspicious osseous lesions. CT ABDOMEN PELVIS FINDINGS Hepatobiliary: Uncomplicated cholelithiasis. Nondistended gallbladder. 9 mm hypodensity along the periphery of the right hepatic lobe, series 3, image 40 may represent a small cyst or hemangioma but is too small to further characterize. No biliary dilatation. Pancreas: Normal Spleen: Normal in size without focal abnormality. Adrenals/Urinary Tract: Nonspecific 2.5 x 1.7 cm left adrenal hypodense nodule with Hounsfield unit of 62, indeterminate for a benign adenoma. The right adrenal gland is normal. What appears to be the left kidney is markedly atrophic in appearance with compensatory hypertrophy of the right kidney. No nephrolithiasis nor obstructive uropathy is seen of the right kidney. No enhancing renal mass is noted. The bladder is physiologically distended. No mass or calculus is noted within.  Stomach/Bowel: The stomach is distended with food. There is normal small bowel rotation  without obstruction or inflammation. Contrast reaches the large intestine. There is moderate amount fecal residue within the distal transverse and proximal descending colon. Vascular/Lymphatic: Aortoiliac and branch vessel atherosclerosis without aneurysm or dissection is noted. Reproductive: Normal size prostate and seminal vesicles. Calcifications are noted along the corpora cavernosa and spongiosum of the penis. Other: No abdominal wall hernia or abnormality. No abdominopelvic ascites. Musculoskeletal: Thoracolumbar spondylosis. IMPRESSION: 1. Aortoiliac and branch vessel atherosclerosis without aneurysm or dissection. 2. Centrilobular emphysema of the lungs . 3. 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 4. Markedly atrophic appearance of the left kidney with compensatory hypertrophy of the right kidney. 5. Indeterminate left adrenal nodule measuring 2.5 x 1.7 cm. This can be further worked up with dedicated CT or MRI with adrenal protocol as clinically necessary. 6. Uncomplicated cholelithiasis. 7. Subcentimeter right hepatic hypodensity too small to further characterize but statistically consistent with a cyst or hemangioma. 8. Thoracolumbar spondylosis. Electronically Signed   By: Ashley Royalty M.D.   On: 05/25/2017 17:59   Mr Jodene Nam Head/brain PY Cm  Result Date: 05/21/2017 CLINICAL DATA:  Frequent falls with gait instability and dizziness. Symptoms began acutely yesterday. EXAM: MRI HEAD WITHOUT CONTRAST MRA HEAD WITHOUT CONTRAST TECHNIQUE: Multiplanar, multiecho pulse sequences of the brain and surrounding structures were obtained without intravenous contrast. Angiographic images of the head were obtained using MRA technique without contrast. COMPARISON:  CT 05/20/2017 and 02/27/2017 FINDINGS: MRI HEAD FINDINGS Brain: Diffusion imaging does not show any acute or subacute infarction. Mild chronic  small-vessel ischemic changes are present affecting the pons in the cerebral hemispheric deep and subcortical white matter. No cortical or large vessel territory infarction. No mass lesion, hemorrhage, hydrocephalus or extra-axial collection. Vascular: Major vessels at the base of the brain show flow. Skull and upper cervical spine: Negative Sinuses/Orbits: Clear/normal Other: None significant MRA HEAD FINDINGS Both internal carotid arteries are patent through the skullbase and siphon regions. There is motion degradation in the siphon region. Anterior and middle cerebral vessels are patent without proximal stenosis, aneurysm or vascular malformation. Both vertebral arteries are patent to the basilar. No basilar stenosis. Posterior circulation branch vessels appear normal. IMPRESSION: No acute finding by MRI. 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 Negative intracranial MR angiography of the large and medium size vessels. Some motion degradation. Electronically Signed   By: Nelson Chimes M.D.   On: 05/21/2017 09:20      Discharge Exam: Vitals:   05/27/17 0530 05/27/17 0932  BP: (!) 155/70 (!) 115/55  Pulse: 63 65  Resp: 16 18  Temp: 98 F (36.7 C) 99.2 F (37.3 C)   Vitals:   05/26/17 2128 05/27/17 0048 05/27/17 0530 05/27/17 0932  BP: (!) 120/57 (!) 122/59 (!) 155/70 (!) 115/55  Pulse: 70 60 63 65  Resp: 18 18 16 18   Temp: 99.3 F (37.4 C) 98.8 F (37.1 C) 98 F (36.7 C) 99.2 F (37.3 C)  TempSrc: Oral Oral Oral Oral  SpO2: 100% 100% 100% 100%  Weight:      Height:        General: Pt is alert, follows commands appropriately, not in acute distress Cardiovascular: Regular rate and rhythm, no rubs, no gallops Respiratory: Clear to auscultation bilaterally, no wheezing, no crackles, no rhonchi Abdominal: Soft, non tender, non distended, bowel sounds +, no guarding Extremities: no edema, no cyanosis, pulses palpable bilaterally DP and  PT Neuro: Grossly nonfocal  Discharge Instructions   Allergies as of 05/27/2017   No  Known Allergies     Medication List    TAKE these medications   aspirin EC 81 MG tablet Take 81 mg by mouth daily.   CENTRUM SILVER ADULT 50+ Tabs Take 1 capsule by mouth 2 (two) times daily. What changed:  how much to take  when to take this   escitalopram 10 MG tablet Commonly known as:  LEXAPRO Take 1 tablet (10 mg total) by mouth daily.   ferrous sulfate 325 (65 FE) MG tablet Take 1 tablet (325 mg total) by mouth 2 (two) times daily with a meal.   gi cocktail Susp suspension Take 30 mLs by mouth 3 (three) times daily as needed for indigestion. Each 30 mL dose contains Maalox 12 mL, Viscous Lidocaine 12 mL, Donnatal 6 mL   losartan-hydrochlorothiazide 50-12.5 MG tablet Commonly known as:  HYZAAR Take 1 tablet by mouth daily.   meclizine 12.5 MG tablet Commonly known as:  ANTIVERT Take 1 tablet (12.5 mg total) by mouth 3 (three) times daily as needed for dizziness.   traMADol 50 MG tablet Commonly known as:  ULTRAM Take 1 tablet (50 mg total) by mouth every 6 (six) hours as needed for moderate pain.       Follow-up Information    [name], MD Follow up.   Specialty:  Family Medicine Contact information: [address] [place] [address] [phone]            The results of significant diagnostics from this hospitalization (including imaging, microbiology, ancillary and laboratory) are listed below for reference.     Microbiology: Recent Results (from the past 240 hour(s))  Urine culture     Status: None   Collection Time: 05/20/17  9:18 AM  Result Value Ref Range Status   Specimen Description URINE, CLEAN CATCH  Final   Special Requests NONE  Final   Culture   Final    NO GROWTH Performed at [facility], [address], [place], [place] [address]    Report Status 05/22/2017 FINAL  Final     Labs: Basic Metabolic Panel:  Recent  Labs Lab 05/20/17 1702 05/23/17 0944 05/26/17 0520 05/27/17 0255  NA  --  135 137 137  K  --  3.9 4.0 4.1  CL  --  101 106 106  CO2  --  23 26 25   GLUCOSE  --  116* 103* 105*  BUN  --  13 18 20   CREATININE 1.07 0.98 1.11 1.09  CALCIUM  --  9.7 8.6* 8.6*   Liver Function Tests:  Recent Labs Lab 05/26/17 0520  AST 12*  ALT 14*  ALKPHOS 34*  BILITOT 0.4  PROT 5.1*  ALBUMIN 3.1*   CBC:  Recent Labs Lab 05/20/17 1702 05/23/17 0944 05/26/17 0520 05/27/17 0255  WBC 5.6 5.9 5.1 5.8  HGB 11.1* 10.9* 9.3* 8.9*  HCT 34.3* 34.2* 28.9* 28.2*  MCV 87.5 86.1 86.8 88.1  PLT 218 238 200 188   Cardiac Enzymes:  Recent Labs Lab 05/26/17 1042 05/26/17 1525 05/26/17 2132  TROPONINI <0.03 <0.03 <0.03   SIGNED: Time coordinating discharge: 65 minutes  Faye Ramsay, MD  [facility] 05/27/2017, 10:16 AM Pager [phone]  If 7PM-7AM, please contact night-coverage [email or web address] Password [email or web address]

## 2017-05-27 NOTE — Progress Notes (Signed)
Occupational Therapy Treatment/Discharge Patient Details Name: Devin Valdez MRN: [id number] DOB: [birth year] Today's Date: 05/27/2017    History of present illness 76 y.o. male with a Past Medical History significant for hypertension, depression and hyperlipidemia who presents with failure to thrive.. Pt with complaints of complaints of gait instability, frequents falls. Pt orthostatic in neurologist's office and was sent to [facility].    OT comments  Pt progressing very well and was able to complete mobility and ADL tasks with supervision for safety only. Educated pt and pt's wife on fall prevention strategies, gradual activity progression, and improving sleep hygiene/routine. Pt demonstrates sufficient safety awareness and was able to verbalize/demonstrate ability to accommodate for dizziness while ambulating. Pt with no further acute OT needs. All acute OT goals achieved. OT signing off.    Follow Up Recommendations  No OT follow up;Supervision - Intermittent    Equipment Recommendations  None recommended by OT    Recommendations for Other Services      Precautions / Restrictions Precautions Precautions: Fall Restrictions Weight Bearing Restrictions: No       Mobility Bed Mobility Overal bed mobility: Modified Independent                Transfers Overall transfer level: Needs assistance Equipment used: None Transfers: Sit to/from Stand Sit to Stand: Supervision         General transfer comment: supervision for safety    Balance Overall balance assessment: Needs assistance Sitting-balance support: No upper extremity supported;Feet supported Sitting balance-Leahy Scale: Normal     Standing balance support: No upper extremity supported;During functional activity Standing balance-Leahy Scale: Good Standing balance comment: Pt remains very cautious during all mobility and ADL tasks                           ADL either performed or assessed with  clinical judgement   ADL Overall ADL's : Needs assistance/impaired         Upper Body Bathing: Set up;With caregiver independent assisting;Sitting   Lower Body Bathing: Set up;With caregiver independent assisting;Sit to/from stand   Upper Body Dressing : Set up;Sitting;With caregiver independent assisting   Lower Body Dressing: Set up;With caregiver independent assisting;Sit to/from stand   Toilet Transfer: Sales executive;Ambulation   Toileting- Clothing Manipulation and Hygiene: Supervision/safety;Sit to/from stand   Tub/ Shower Transfer: Walk-in shower;Supervision/safety;Ambulation   Functional mobility during ADLs: Supervision/safety General ADL Comments: Discussed gradual activity progression, fall prevention strategies, and sleep hygiene/routine. Pt very cautious with all mobility and reported mild "wooziness" x2 when ambulating 150'. Pt able to stop and wait for symptoms to subside without cueing     Vision   Vision Assessment?: No apparent visual deficits   Perception     Praxis      Cognition Arousal/Alertness: Awake/alert Behavior During Therapy: Flat affect Overall Cognitive Status: Within Functional Limits for tasks assessed                                          Exercises     Shoulder Instructions       General Comments      Pertinent Vitals/ Pain       Pain Assessment: No/denies pain  Home Living  Prior Functioning/Environment              Frequency           Progress Toward Goals  OT Goals(current goals can now be found in the care plan section)  Progress towards OT goals: Goals met/education completed, patient discharged from OT  Acute Rehab OT Goals Patient Stated Goal: to get better and be able to do yardwork again OT Goal Formulation: With patient Time For Goal Achievement: 06/09/17 Potential to Achieve Goals: Good ADL Goals Pt  Will Perform Lower Body Bathing: with supervision;with caregiver independent in assisting;sit to/from stand Pt Will Perform Lower Body Dressing: with supervision;with caregiver independent in assisting;sit to/from stand Pt Will Transfer to Toilet: with supervision;ambulating Pt Will Perform Toileting - Clothing Manipulation and hygiene: with modified independence;sit to/from stand Pt Will Perform Tub/Shower Transfer: with supervision;with caregiver independent in assisting;ambulating;shower seat;Shower transfer;rolling walker Additional ADL Goal #1: PT/wife will independently verbalize 3 strategies to reduce risk of falls.  Plan All goals met and education completed, patient discharged from OT services    Co-evaluation                 AM-PAC PT "6 Clicks" Daily Activity     Outcome Measure   Help from another person eating meals?: None Help from another person taking care of personal grooming?: None Help from another person toileting, which includes using toliet, bedpan, or urinal?: None Help from another person bathing (including washing, rinsing, drying)?: A Little Help from another person to put on and taking off regular upper body clothing?: None Help from another person to put on and taking off regular lower body clothing?: None 6 Click Score: 23    End of Session Equipment Utilized During Treatment: Gait belt  OT Visit Diagnosis: Unsteadiness on feet (R26.81);Muscle weakness (generalized) (M62.81)   Activity Tolerance Patient tolerated treatment well   Patient Left in chair;with call bell/phone within reach;with family/visitor present   Nurse Communication Mobility status        Time: 1207-1226 OT Time Calculation (min): 19 min  Charges: OT General Charges $OT Visit: 1 Procedure OT Treatments $Self Care/Home Management : 8-22 mins   Redmond Baseman, MS, OTR/L 05/27/2017, 12:41 PM

## 2017-05-28 LAB — BASIC METABOLIC PANEL
ANION GAP: 6 (ref 5–15)
BUN: 22 mg/dL — ABNORMAL HIGH (ref 6–20)
CO2: 26 mmol/L (ref 22–32)
Calcium: 8.7 mg/dL — ABNORMAL LOW (ref 8.9–10.3)
Chloride: 106 mmol/L (ref 101–111)
Creatinine, Ser: 1.14 mg/dL (ref 0.61–1.24)
GFR calc Af Amer: >60 mL/min (ref >60)
GFR calc non Af Amer: >60 mL/min (ref >60)
GLUCOSE: 109 mg/dL — AB (ref 65–99)
POTASSIUM: 3.7 mmol/L (ref 3.5–5.1)
Sodium: 138 mmol/L (ref 135–145)

## 2017-05-28 LAB — CBC
HEMATOCRIT: 28.5 % — AB (ref 39.0–52.0)
Hemoglobin: 8.9 g/dL — ABNORMAL LOW (ref 13.0–17.0)
MCH: 27.7 pg (ref 26.0–34.0)
MCHC: 31.2 g/dL (ref 30.0–36.0)
MCV: 88.8 fL (ref 78.0–100.0)
Platelets: 189 10*3/uL (ref 150–400)
RBC: 3.21 MIL/uL — AB (ref 4.22–5.81)
RDW: 12.6 % (ref 11.5–15.5)
WBC: 5.4 10*3/uL (ref 4.0–10.5)

## 2017-05-28 MED ORDER — GI COCKTAIL ~~LOC~~: 30.0000 mL | Freq: Three times a day (TID) | ORAL | 1 refills | Status: DC | PRN

## 2017-05-28 MED ORDER — TRAMADOL HCL 50 MG PO TABS: 50.0000 mg | ORAL_TABLET | Freq: Four times a day (QID) | ORAL | 0 refills | Status: DC | PRN

## 2017-05-28 MED ORDER — FERROUS SULFATE 325 (65 FE) MG PO TABS: 325.0000 mg | ORAL_TABLET | Freq: Two times a day (BID) | ORAL | 1 refills | Status: AC

## 2017-05-28 NOTE — Progress Notes (Signed)
remeron not in bin.  Notified pharmacy.

## 2017-05-28 NOTE — Care Management Note (Signed)
Case Management Note  Patient Details  Name: Devin Valdez MRN: [id number] Date of Birth: [birth year]  Subjective/Objective:                  Gait instability Action/Plan: Discharge planning Expected Discharge Date:  05/28/17               Expected Discharge Plan:  [place]  In-House Referral:     Discharge planning Services  CM Consult  Post Acute Care Choice:  Home Health Choice offered to:  Patient  DME Arranged:    DME Agency:     HH Arranged:  PT, RN, Nurse's Aide [place] Agency:  [facility]  Status of Service:  Completed, signed off  If discussed at [place] of Stay Meetings, dates discussed:    Additional Comments: CM spoke with pt and spouse of pt for choice of home health agency. Family choose [facility] to render HHPT/RN/aide.  Reerral called to [facility]. No DME needed. No other CM needs were communicated. Dellie Catholic, RN 05/28/2017, 1:26 PM

## 2017-05-28 NOTE — Discharge Summary (Signed)
Physician Discharge Summary  Devin Valdez:[id number] DOB: December 23, 1940 DOA: 05/25/2017  PCP: [name], MD  Admit date: 05/25/2017 Discharge date: 05/28/2017  Recommendations for Outpatient Follow-up:  1. Pt will need to follow up with PCP in 1-2 weeks post discharge 2. Please obtain BMP to evaluate electrolytes and kidney function 3. Please also check CBC to evaluate Hg and Hct levels  Discharge Diagnoses:  Active Problems:   Gait instability   Frequent falls   Depression   Failure to thrive (0-17)   Abdominal pulsatile mass   Anemia   Essential hypertension, benign   Failure to thrive in adult  Discharge Condition: Stable  Diet recommendation: Heart healthy diet discussed in details   Brief Narrative:  Pt is 76 yo male with known HTN, HLD, progressive failure to thrive and with hospitalizations for this, most recent discharge on July 1st, 2018. He has presented now with progressive weakness, gait instability, frequent falls, persistent failure to thrive (lost 25 lbs since April 2018). Pt was not to be orthostatic on prior admission and had work up done including CT and MRI brain, carotid dopplers, ECHO, all of which were negative. Pt apparently also had a work up done at [facility] and was told he has lung mass but no specific further work up done.   Assessment & Plan:      Failure to thrive, gait instability, frequent falls, unresolved from previous admission - in the setting of severe orthostatic hypotension  - reports feeling better but still with occasional unsteadiness  - did well with PT and wants to go home     Dizziness, orthostatic hypotension  - has seen Dr. Leonie Man in the office 05/25/2017 and was severely orthostatic with BP drop from 142/79 --> 103/63 - this is recurrent problem and work has been done for this in the past at [facility]  - has done well with IVF and oral hydration encouraged     Iron deficiency anemia - with Iron level 28 - no  evidence of active bleeding - continue iron supplementation     Chest pain  - suspect acid reflux but not very clear - resolved with GI cocktail     Left adrenal nodule  - outpatient work up    HTN, essential - reasonable inpatient control for now    Depression  - on Lexapro 20 mg PO QD    Severe PCM - diet advanced and pt has tolerated well   DVT prophylaxis: SCD's Code Status: Full  Family Communication: Patient and wife at bedside  Disposition Plan: home   Consultants:   PT/OT  Nutritionist   Procedures:   None  Antimicrobials:   None  Procedures/Studies: Dg Chest 2 View  Result Date: 05/20/2017 CLINICAL DATA:  Unsteady gait today.  Weakness. EXAM: CHEST  2 VIEW COMPARISON:  01/22/2016 FINDINGS: Cardiac silhouette is normal in size. No mediastinal or hilar masses. No evidence of adenopathy. Clear lungs.  No pleural effusion or pneumothorax. Skeletal structures are intact. IMPRESSION: No active cardiopulmonary disease. Electronically Signed   By: Lajean Manes M.D.   On: 05/20/2017 09:42   Ct Head Wo Contrast  Result Date: 05/20/2017 CLINICAL DATA:  Wife reports that pt has been falling recently. More falls over the past 2 days Loss of balance and falls over. States he has poor po intake. Noted to be shuffling his feet. Denies pain. Wife reports started back on antidepressant this week. EXAM: CT HEAD WITHOUT CONTRAST TECHNIQUE: Contiguous axial images  were obtained from the base of the skull through the vertex without intravenous contrast. COMPARISON:  02/27/2017 FINDINGS: Brain: No evidence of acute infarction, hemorrhage, hydrocephalus, extra-axial collection or mass lesion/mass effect. There is age appropriate ventricular and sulcal enlargement stable from the prior study. Vascular: No hyperdense vessel or unexpected calcification. Skull: Normal. Negative for fracture or focal lesion. Sinuses/Orbits: Globes and orbits are unremarkable. Visualized sinuses and  mastoid air cells are clear. Other: None. IMPRESSION: 1. Normal unenhanced CT scan of the brain for age. No change from prior study. Electronically Signed   By: Lajean Manes M.D.   On: 05/20/2017 09:36   Ct Chest W Contrast  Result Date: 05/25/2017 CLINICAL DATA:  Failure to thrive, gait instability, abdominal pulsatile mass falx. Rule out abdominal aortic aneurysm. EXAM: CT CHEST, ABDOMEN, AND PELVIS WITH CONTRAST TECHNIQUE: Multidetector CT imaging of the chest, abdomen and pelvis was performed following the standard protocol during bolus administration of intravenous contrast. CONTRAST:  168mL ISOVUE-300 IOPAMIDOL (ISOVUE-300) INJECTION 61% COMPARISON:  Report of an ultrasound of the abdomen from 06/18/2002 FINDINGS: CT CHEST FINDINGS Cardiovascular: Normal 3 vessel takeoff of the great vessels. Thoracic aorta is atherosclerotic. No aneurysm of the thoracic aorta. Heart is top-normal in size. No pericardial effusion is noted. Three-vessel coronary arteriosclerosis is identified. No large central pulmonary embolus. Mediastinum/Nodes: The thyroid gland is not enlarged. No mediastinal or hilar adenopathy. The trachea and mainstem bronchi are patent. The CT appearance of the esophagus is unremarkable. Lungs/Pleura: Mild centrilobular emphysema of the lungs. Dependent atelectasis is noted at each lung base. No dominant mass, pneumonic consolidation, effusion or pneumothorax. Musculoskeletal: Thoracolumbar spondylosis with diffuse idiopathic skeletal hyperostosis along the mid to lower thoracic spine characterized by flowing anterior osteophytes. No acute nor suspicious osseous lesions. CT ABDOMEN PELVIS FINDINGS Hepatobiliary: Uncomplicated cholelithiasis. Nondistended gallbladder. 9 mm hypodensity along the periphery of the right hepatic lobe, series 3, image 40 may represent a small cyst or hemangioma but is too small to further characterize. No biliary dilatation. Pancreas: Normal Spleen: Normal in size without  focal abnormality. Adrenals/Urinary Tract: Nonspecific 2.5 x 1.7 cm left adrenal hypodense nodule with Hounsfield unit of 62, indeterminate for a benign adenoma. The right adrenal gland is normal. What appears to be the left kidney is markedly atrophic in appearance with compensatory hypertrophy of the right kidney. No nephrolithiasis nor obstructive uropathy is seen of the right kidney. No enhancing renal mass is noted. The bladder is physiologically distended. No mass or calculus is noted within. Stomach/Bowel: The stomach is distended with food. There is normal small bowel rotation without obstruction or inflammation. Contrast reaches the large intestine. There is moderate amount fecal residue within the distal transverse and proximal descending colon. Vascular/Lymphatic: Aortoiliac and branch vessel atherosclerosis without aneurysm or dissection is noted. Reproductive: Normal size prostate and seminal vesicles. Calcifications are noted along the corpora cavernosa and spongiosum of the penis. Other: No abdominal wall hernia or abnormality. No abdominopelvic ascites. Musculoskeletal: Thoracolumbar spondylosis. IMPRESSION: 1. Aortoiliac and branch vessel atherosclerosis without aneurysm or dissection. 2. Centrilobular emphysema of the lungs . 3. 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 4. Markedly atrophic appearance of the left kidney with compensatory hypertrophy of the right kidney. 5. Indeterminate left adrenal nodule measuring 2.5 x 1.7 cm. This can be further worked up with dedicated CT or MRI with adrenal protocol as clinically necessary. 6. Uncomplicated cholelithiasis. 7. Subcentimeter right hepatic hypodensity too small to further characterize but statistically consistent with a cyst or hemangioma. 8. Thoracolumbar spondylosis. Electronically  Signed   By: Ashley Royalty M.D.   On: 05/25/2017 17:59   Mr Brain Wo Contrast  Result Date: 05/21/2017 CLINICAL DATA:  Frequent falls with  gait instability and dizziness. Symptoms began acutely yesterday. EXAM: MRI HEAD WITHOUT CONTRAST MRA HEAD WITHOUT CONTRAST TECHNIQUE: Multiplanar, multiecho pulse sequences of the brain and surrounding structures were obtained without intravenous contrast. Angiographic images of the head were obtained using MRA technique without contrast. COMPARISON:  CT 05/20/2017 and 02/27/2017 FINDINGS: MRI HEAD FINDINGS Brain: Diffusion imaging does not show any acute or subacute infarction. Mild chronic small-vessel ischemic changes are present affecting the pons in the cerebral hemispheric deep and subcortical white matter. No cortical or large vessel territory infarction. No mass lesion, hemorrhage, hydrocephalus or extra-axial collection. Vascular: Major vessels at the base of the brain show flow. Skull and upper cervical spine: Negative Sinuses/Orbits: Clear/normal Other: None significant MRA HEAD FINDINGS Both internal carotid arteries are patent through the skullbase and siphon regions. There is motion degradation in the siphon region. Anterior and middle cerebral vessels are patent without proximal stenosis, aneurysm or vascular malformation. Both vertebral arteries are patent to the basilar. No basilar stenosis. Posterior circulation branch vessels appear normal. IMPRESSION: No acute finding by MRI. 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 Negative intracranial MR angiography of the large and medium size vessels. Some motion degradation. Electronically Signed   By: Nelson Chimes M.D.   On: 05/21/2017 09:20   Ct Abdomen Pelvis W Contrast  Result Date: 05/25/2017 CLINICAL DATA:  Failure to thrive, gait instability, abdominal pulsatile mass falx. Rule out abdominal aortic aneurysm. EXAM: CT CHEST, ABDOMEN, AND PELVIS WITH CONTRAST TECHNIQUE: Multidetector CT imaging of the chest, abdomen and pelvis was performed following the standard protocol during bolus  administration of intravenous contrast. CONTRAST:  146mL ISOVUE-300 IOPAMIDOL (ISOVUE-300) INJECTION 61% COMPARISON:  Report of an ultrasound of the abdomen from 06/18/2002 FINDINGS: CT CHEST FINDINGS Cardiovascular: Normal 3 vessel takeoff of the great vessels. Thoracic aorta is atherosclerotic. No aneurysm of the thoracic aorta. Heart is top-normal in size. No pericardial effusion is noted. Three-vessel coronary arteriosclerosis is identified. No large central pulmonary embolus. Mediastinum/Nodes: The thyroid gland is not enlarged. No mediastinal or hilar adenopathy. The trachea and mainstem bronchi are patent. The CT appearance of the esophagus is unremarkable. Lungs/Pleura: Mild centrilobular emphysema of the lungs. Dependent atelectasis is noted at each lung base. No dominant mass, pneumonic consolidation, effusion or pneumothorax. Musculoskeletal: Thoracolumbar spondylosis with diffuse idiopathic skeletal hyperostosis along the mid to lower thoracic spine characterized by flowing anterior osteophytes. No acute nor suspicious osseous lesions. CT ABDOMEN PELVIS FINDINGS Hepatobiliary: Uncomplicated cholelithiasis. Nondistended gallbladder. 9 mm hypodensity along the periphery of the right hepatic lobe, series 3, image 40 may represent a small cyst or hemangioma but is too small to further characterize. No biliary dilatation. Pancreas: Normal Spleen: Normal in size without focal abnormality. Adrenals/Urinary Tract: Nonspecific 2.5 x 1.7 cm left adrenal hypodense nodule with Hounsfield unit of 62, indeterminate for a benign adenoma. The right adrenal gland is normal. What appears to be the left kidney is markedly atrophic in appearance with compensatory hypertrophy of the right kidney. No nephrolithiasis nor obstructive uropathy is seen of the right kidney. No enhancing renal mass is noted. The bladder is physiologically distended. No mass or calculus is noted within. Stomach/Bowel: The stomach is distended with  food. There is normal small bowel rotation without obstruction or inflammation. Contrast reaches the large intestine. There is  moderate amount fecal residue within the distal transverse and proximal descending colon. Vascular/Lymphatic: Aortoiliac and branch vessel atherosclerosis without aneurysm or dissection is noted. Reproductive: Normal size prostate and seminal vesicles. Calcifications are noted along the corpora cavernosa and spongiosum of the penis. Other: No abdominal wall hernia or abnormality. No abdominopelvic ascites. Musculoskeletal: Thoracolumbar spondylosis. IMPRESSION: 1. Aortoiliac and branch vessel atherosclerosis without aneurysm or dissection. 2. Centrilobular emphysema of the lungs . 3. 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 4. Markedly atrophic appearance of the left kidney with compensatory hypertrophy of the right kidney. 5. Indeterminate left adrenal nodule measuring 2.5 x 1.7 cm. This can be further worked up with dedicated CT or MRI with adrenal protocol as clinically necessary. 6. Uncomplicated cholelithiasis. 7. Subcentimeter right hepatic hypodensity too small to further characterize but statistically consistent with a cyst or hemangioma. 8. Thoracolumbar spondylosis. Electronically Signed   By: Ashley Royalty M.D.   On: 05/25/2017 17:59   Mr Jodene Nam Head/brain ZH Cm  Result Date: 05/21/2017 CLINICAL DATA:  Frequent falls with gait instability and dizziness. Symptoms began acutely yesterday. EXAM: MRI HEAD WITHOUT CONTRAST MRA HEAD WITHOUT CONTRAST TECHNIQUE: Multiplanar, multiecho pulse sequences of the brain and surrounding structures were obtained without intravenous contrast. Angiographic images of the head were obtained using MRA technique without contrast. COMPARISON:  CT 05/20/2017 and 02/27/2017 FINDINGS: MRI HEAD FINDINGS Brain: Diffusion imaging does not show any acute or subacute infarction. Mild chronic small-vessel ischemic changes are present affecting  the pons in the cerebral hemispheric deep and subcortical white matter. No cortical or large vessel territory infarction. No mass lesion, hemorrhage, hydrocephalus or extra-axial collection. Vascular: Major vessels at the base of the brain show flow. Skull and upper cervical spine: Negative Sinuses/Orbits: Clear/normal Other: None significant MRA HEAD FINDINGS Both internal carotid arteries are patent through the skullbase and siphon regions. There is motion degradation in the siphon region. Anterior and middle cerebral vessels are patent without proximal stenosis, aneurysm or vascular malformation. Both vertebral arteries are patent to the basilar. No basilar stenosis. Posterior circulation branch vessels appear normal. IMPRESSION: No acute finding by MRI. 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 Negative intracranial MR angiography of the large and medium size vessels. Some motion degradation. Electronically Signed   By: Nelson Chimes M.D.   On: 05/21/2017 09:20     Discharge Exam: Vitals:   05/28/17 0519 05/28/17 0849  BP: (!) 151/55 120/64  Pulse: 64 68  Resp: 20 18  Temp: 98.9 F (37.2 C) 98.8 F (37.1 C)   Vitals:   05/27/17 1750 05/28/17 0116 05/28/17 0519 05/28/17 0849  BP: (!) 132/59 (!) 137/58 (!) 151/55 120/64  Pulse: 64 65 64 68  Resp: 20 20 20 18   Temp: 99.2 F (37.3 C) 98.9 F (37.2 C) 98.9 F (37.2 C) 98.8 F (37.1 C)  TempSrc: Oral Oral Oral Oral  SpO2: 99% 100% 99% 100%  Weight:      Height:        General: Pt is alert, follows commands appropriately, not in acute distress Cardiovascular: Regular rate and rhythm, no rubs, no gallops Respiratory: Clear to auscultation bilaterally, no wheezing, no crackles, no rhonchi Abdominal: Soft, non tender, non distended, bowel sounds +, no guarding  Discharge Instructions  Discharge Instructions    Diet - low sodium heart healthy    Complete by:  As directed    Increase  activity slowly    Complete by:  As directed  Allergies as of 05/28/2017   No Known Allergies     Medication List    TAKE these medications   aspirin EC 81 MG tablet Take 81 mg by mouth daily.   CENTRUM SILVER ADULT 50+ Tabs Take 1 capsule by mouth 2 (two) times daily. What changed:  how much to take  when to take this   escitalopram 10 MG tablet Commonly known as:  LEXAPRO Take 1 tablet (10 mg total) by mouth daily.   ferrous sulfate 325 (65 FE) MG tablet Take 1 tablet (325 mg total) by mouth 2 (two) times daily with a meal.   gi cocktail Susp suspension Take 30 mLs by mouth 3 (three) times daily as needed for indigestion. Each 30 mL dose contains Maalox 12 mL, Viscous Lidocaine 12 mL, Donnatal 6 mL   losartan-hydrochlorothiazide 50-12.5 MG tablet Commonly known as:  HYZAAR Take 1 tablet by mouth daily.   meclizine 12.5 MG tablet Commonly known as:  ANTIVERT Take 1 tablet (12.5 mg total) by mouth 3 (three) times daily as needed for dizziness.   traMADol 50 MG tablet Commonly known as:  ULTRAM Take 1 tablet (50 mg total) by mouth every 6 (six) hours as needed for moderate pain.       Follow-up Information    [name], MD Follow up.   Specialty:  Family Medicine Contact information: [address] [place] [address] [phone]            The results of significant diagnostics from this hospitalization (including imaging, microbiology, ancillary and laboratory) are listed below for reference.     Microbiology: Recent Results (from the past 240 hour(s))  Urine culture     Status: None   Collection Time: 05/20/17  9:18 AM  Result Value Ref Range Status   Specimen Description URINE, CLEAN CATCH  Final   Special Requests NONE  Final   Culture   Final    NO GROWTH Performed at [facility], [address], [place], [place] [address]    Report Status 05/22/2017 FINAL  Final     Labs: Basic Metabolic Panel:  Recent  Labs Lab 05/23/17 0944 05/26/17 0520 05/27/17 0255 05/28/17 0405  NA 135 137 137 138  K 3.9 4.0 4.1 3.7  CL 101 106 106 106  CO2 23 26 25 26   GLUCOSE 116* 103* 105* 109*  BUN 13 18 20  22*  CREATININE 0.98 1.11 1.09 1.14  CALCIUM 9.7 8.6* 8.6* 8.7*   Liver Function Tests:  Recent Labs Lab 05/26/17 0520  AST 12*  ALT 14*  ALKPHOS 34*  BILITOT 0.4  PROT 5.1*  ALBUMIN 3.1*   CBC:  Recent Labs Lab 05/23/17 0944 05/26/17 0520 05/27/17 0255 05/28/17 0405  WBC 5.9 5.1 5.8 5.4  HGB 10.9* 9.3* 8.9* 8.9*  HCT 34.2* 28.9* 28.2* 28.5*  MCV 86.1 86.8 88.1 88.8  PLT 238 200 188 189   Cardiac Enzymes:  Recent Labs Lab 05/26/17 1042 05/26/17 1525 05/26/17 2132  TROPONINI <0.03 <0.03 <0.03   SIGNED: Time coordinating discharge: 65 minutes  Faye Ramsay, MD  [facility] 05/28/2017, 9:11 AM Pager [phone]  If 7PM-7AM, please contact night-coverage [email or web address] Password [email or web address]

## 2017-05-28 NOTE — Progress Notes (Signed)
Discharge instructions provided to patient and significant other, denies questions/concerns at this time. Patient taken to front entrance via [place] by this RN after PIV removed, tol well.

## 2017-05-31 DIAGNOSIS — R42 Dizziness and giddiness: Secondary | ICD-10-CM | POA: Diagnosis not present

## 2017-05-31 DIAGNOSIS — I10 Essential (primary) hypertension: Secondary | ICD-10-CM | POA: Diagnosis not present

## 2017-06-06 DIAGNOSIS — I10 Essential (primary) hypertension: Secondary | ICD-10-CM | POA: Diagnosis not present

## 2017-06-06 DIAGNOSIS — D509 Iron deficiency anemia, unspecified: Secondary | ICD-10-CM | POA: Diagnosis not present

## 2017-06-06 DIAGNOSIS — E43 Unspecified severe protein-calorie malnutrition: Secondary | ICD-10-CM | POA: Diagnosis not present

## 2017-06-06 DIAGNOSIS — D4410 Neoplasm of uncertain behavior of unspecified adrenal gland: Secondary | ICD-10-CM | POA: Diagnosis not present

## 2017-06-06 DIAGNOSIS — R296 Repeated falls: Secondary | ICD-10-CM | POA: Diagnosis not present

## 2017-06-06 DIAGNOSIS — F329 Major depressive disorder, single episode, unspecified: Secondary | ICD-10-CM | POA: Diagnosis not present

## 2017-06-06 DIAGNOSIS — I951 Orthostatic hypotension: Secondary | ICD-10-CM | POA: Diagnosis not present

## 2017-06-06 DIAGNOSIS — Z9181 History of falling: Secondary | ICD-10-CM | POA: Diagnosis not present

## 2017-06-06 DIAGNOSIS — N261 Atrophy of kidney (terminal): Secondary | ICD-10-CM | POA: Diagnosis not present

## 2017-06-07 ENCOUNTER — Encounter: Payer: Self-pay | Admitting: Neurology

## 2017-06-08 DIAGNOSIS — F329 Major depressive disorder, single episode, unspecified: Secondary | ICD-10-CM | POA: Diagnosis not present

## 2017-06-08 DIAGNOSIS — E43 Unspecified severe protein-calorie malnutrition: Secondary | ICD-10-CM | POA: Diagnosis not present

## 2017-06-08 DIAGNOSIS — N261 Atrophy of kidney (terminal): Secondary | ICD-10-CM | POA: Diagnosis not present

## 2017-06-08 DIAGNOSIS — I10 Essential (primary) hypertension: Secondary | ICD-10-CM | POA: Diagnosis not present

## 2017-06-08 DIAGNOSIS — R296 Repeated falls: Secondary | ICD-10-CM | POA: Diagnosis not present

## 2017-06-08 DIAGNOSIS — I951 Orthostatic hypotension: Secondary | ICD-10-CM | POA: Diagnosis not present

## 2017-06-09 DIAGNOSIS — I951 Orthostatic hypotension: Secondary | ICD-10-CM | POA: Diagnosis not present

## 2017-06-09 DIAGNOSIS — E43 Unspecified severe protein-calorie malnutrition: Secondary | ICD-10-CM | POA: Diagnosis not present

## 2017-06-09 DIAGNOSIS — F329 Major depressive disorder, single episode, unspecified: Secondary | ICD-10-CM | POA: Diagnosis not present

## 2017-06-09 DIAGNOSIS — R296 Repeated falls: Secondary | ICD-10-CM | POA: Diagnosis not present

## 2017-06-09 DIAGNOSIS — N261 Atrophy of kidney (terminal): Secondary | ICD-10-CM | POA: Diagnosis not present

## 2017-06-09 DIAGNOSIS — I10 Essential (primary) hypertension: Secondary | ICD-10-CM | POA: Diagnosis not present

## 2017-06-12 DIAGNOSIS — N261 Atrophy of kidney (terminal): Secondary | ICD-10-CM | POA: Diagnosis not present

## 2017-06-12 DIAGNOSIS — I10 Essential (primary) hypertension: Secondary | ICD-10-CM | POA: Diagnosis not present

## 2017-06-12 DIAGNOSIS — F329 Major depressive disorder, single episode, unspecified: Secondary | ICD-10-CM | POA: Diagnosis not present

## 2017-06-12 DIAGNOSIS — R296 Repeated falls: Secondary | ICD-10-CM | POA: Diagnosis not present

## 2017-06-12 DIAGNOSIS — E43 Unspecified severe protein-calorie malnutrition: Secondary | ICD-10-CM | POA: Diagnosis not present

## 2017-06-12 DIAGNOSIS — I951 Orthostatic hypotension: Secondary | ICD-10-CM | POA: Diagnosis not present

## 2017-06-15 DIAGNOSIS — R296 Repeated falls: Secondary | ICD-10-CM | POA: Diagnosis not present

## 2017-06-15 DIAGNOSIS — E43 Unspecified severe protein-calorie malnutrition: Secondary | ICD-10-CM | POA: Diagnosis not present

## 2017-06-15 DIAGNOSIS — I10 Essential (primary) hypertension: Secondary | ICD-10-CM | POA: Diagnosis not present

## 2017-06-15 DIAGNOSIS — N261 Atrophy of kidney (terminal): Secondary | ICD-10-CM | POA: Diagnosis not present

## 2017-06-15 DIAGNOSIS — I951 Orthostatic hypotension: Secondary | ICD-10-CM | POA: Diagnosis not present

## 2017-06-15 DIAGNOSIS — F329 Major depressive disorder, single episode, unspecified: Secondary | ICD-10-CM | POA: Diagnosis not present

## 2017-06-30 DIAGNOSIS — I951 Orthostatic hypotension: Secondary | ICD-10-CM | POA: Diagnosis not present

## 2017-06-30 DIAGNOSIS — R296 Repeated falls: Secondary | ICD-10-CM | POA: Diagnosis not present

## 2017-06-30 DIAGNOSIS — E43 Unspecified severe protein-calorie malnutrition: Secondary | ICD-10-CM | POA: Diagnosis not present

## 2017-06-30 DIAGNOSIS — N261 Atrophy of kidney (terminal): Secondary | ICD-10-CM | POA: Diagnosis not present

## 2017-06-30 DIAGNOSIS — I10 Essential (primary) hypertension: Secondary | ICD-10-CM | POA: Diagnosis not present

## 2017-06-30 DIAGNOSIS — F329 Major depressive disorder, single episode, unspecified: Secondary | ICD-10-CM | POA: Diagnosis not present

## 2017-07-03 ENCOUNTER — Other Ambulatory Visit (HOSPITAL_COMMUNITY)
Admission: RE | Admit: 2017-07-03 | Discharge: 2017-07-03 | Disposition: A | Discharge status: Home / Self Care | Payer: Medicare Other | Source: Other Acute Inpatient Hospital | Attending: Family Medicine | Admitting: Family Medicine

## 2017-07-03 DIAGNOSIS — N261 Atrophy of kidney (terminal): Secondary | ICD-10-CM | POA: Diagnosis not present

## 2017-07-03 DIAGNOSIS — E43 Unspecified severe protein-calorie malnutrition: Secondary | ICD-10-CM | POA: Diagnosis not present

## 2017-07-03 DIAGNOSIS — R296 Repeated falls: Secondary | ICD-10-CM | POA: Diagnosis not present

## 2017-07-03 DIAGNOSIS — I10 Essential (primary) hypertension: Secondary | ICD-10-CM | POA: Diagnosis not present

## 2017-07-03 DIAGNOSIS — F329 Major depressive disorder, single episode, unspecified: Secondary | ICD-10-CM | POA: Diagnosis not present

## 2017-07-03 DIAGNOSIS — D509 Iron deficiency anemia, unspecified: Secondary | ICD-10-CM | POA: Diagnosis not present

## 2017-07-03 DIAGNOSIS — I951 Orthostatic hypotension: Secondary | ICD-10-CM | POA: Diagnosis not present

## 2017-07-03 LAB — COMPREHENSIVE METABOLIC PANEL
ALT: 17 U/L (ref 17–63)
ANION GAP: 9 (ref 5–15)
AST: 18 U/L (ref 15–41)
Albumin: 3.8 g/dL (ref 3.5–5.0)
Alkaline Phosphatase: 53 U/L (ref 38–126)
BUN: 13 mg/dL (ref 6–20)
CO2: 25 mmol/L (ref 22–32)
Calcium: 9.3 mg/dL (ref 8.9–10.3)
Chloride: 105 mmol/L (ref 101–111)
Creatinine, Ser: 1.08 mg/dL (ref 0.61–1.24)
GFR calc Af Amer: >60 mL/min (ref >60)
GLUCOSE: 107 mg/dL — AB (ref 65–99)
POTASSIUM: 3.8 mmol/L (ref 3.5–5.1)
Sodium: 139 mmol/L (ref 135–145)
Total Bilirubin: 0.4 mg/dL (ref 0.3–1.2)
Total Protein: 6.3 g/dL — ABNORMAL LOW (ref 6.5–8.1)

## 2017-07-03 LAB — CBC WITH DIFFERENTIAL/PLATELET
BASOS ABS: 0 10*3/uL (ref 0.0–0.1)
Basophils Relative: 0 %
Eosinophils Absolute: 0.2 10*3/uL (ref 0.0–0.7)
Eosinophils Relative: 5 %
HEMATOCRIT: 33.8 % — AB (ref 39.0–52.0)
HEMOGLOBIN: 11.2 g/dL — AB (ref 13.0–17.0)
LYMPHS PCT: 29 %
Lymphs Abs: 1.2 10*3/uL (ref 0.7–4.0)
MCH: 29 pg (ref 26.0–34.0)
MCHC: 33.1 g/dL (ref 30.0–36.0)
MCV: 87.6 fL (ref 78.0–100.0)
Monocytes Absolute: 0.2 10*3/uL (ref 0.1–1.0)
Monocytes Relative: 6 %
NEUTROS ABS: 2.4 10*3/uL (ref 1.7–7.7)
Neutrophils Relative %: 60 %
PLATELETS: 203 10*3/uL (ref 150–400)
RBC: 3.86 MIL/uL — AB (ref 4.22–5.81)
RDW: 12.3 % (ref 11.5–15.5)
WBC: 4 10*3/uL (ref 4.0–10.5)

## 2017-07-03 LAB — TSH: TSH: 1.709 u[IU]/mL (ref 0.350–4.500)

## 2017-07-04 DIAGNOSIS — I10 Essential (primary) hypertension: Secondary | ICD-10-CM | POA: Diagnosis not present

## 2017-07-04 DIAGNOSIS — F329 Major depressive disorder, single episode, unspecified: Secondary | ICD-10-CM | POA: Diagnosis not present

## 2017-07-04 DIAGNOSIS — E43 Unspecified severe protein-calorie malnutrition: Secondary | ICD-10-CM | POA: Diagnosis not present

## 2017-07-04 DIAGNOSIS — R296 Repeated falls: Secondary | ICD-10-CM | POA: Diagnosis not present

## 2017-07-04 DIAGNOSIS — N261 Atrophy of kidney (terminal): Secondary | ICD-10-CM | POA: Diagnosis not present

## 2017-07-04 DIAGNOSIS — I951 Orthostatic hypotension: Secondary | ICD-10-CM | POA: Diagnosis not present

## 2017-07-05 DIAGNOSIS — D649 Anemia, unspecified: Secondary | ICD-10-CM | POA: Diagnosis not present

## 2017-07-05 DIAGNOSIS — Z634 Disappearance and death of family member: Secondary | ICD-10-CM | POA: Diagnosis not present

## 2017-07-06 DIAGNOSIS — R296 Repeated falls: Secondary | ICD-10-CM | POA: Diagnosis not present

## 2017-07-06 DIAGNOSIS — N261 Atrophy of kidney (terminal): Secondary | ICD-10-CM | POA: Diagnosis not present

## 2017-07-06 DIAGNOSIS — I951 Orthostatic hypotension: Secondary | ICD-10-CM | POA: Diagnosis not present

## 2017-07-06 DIAGNOSIS — F329 Major depressive disorder, single episode, unspecified: Secondary | ICD-10-CM | POA: Diagnosis not present

## 2017-07-06 DIAGNOSIS — I10 Essential (primary) hypertension: Secondary | ICD-10-CM | POA: Diagnosis not present

## 2017-07-06 DIAGNOSIS — E43 Unspecified severe protein-calorie malnutrition: Secondary | ICD-10-CM | POA: Diagnosis not present

## 2017-07-12 DIAGNOSIS — E43 Unspecified severe protein-calorie malnutrition: Secondary | ICD-10-CM | POA: Diagnosis not present

## 2017-07-12 DIAGNOSIS — F329 Major depressive disorder, single episode, unspecified: Secondary | ICD-10-CM | POA: Diagnosis not present

## 2017-07-12 DIAGNOSIS — N261 Atrophy of kidney (terminal): Secondary | ICD-10-CM | POA: Diagnosis not present

## 2017-07-12 DIAGNOSIS — I951 Orthostatic hypotension: Secondary | ICD-10-CM | POA: Diagnosis not present

## 2017-07-12 DIAGNOSIS — I10 Essential (primary) hypertension: Secondary | ICD-10-CM | POA: Diagnosis not present

## 2017-07-12 DIAGNOSIS — R296 Repeated falls: Secondary | ICD-10-CM | POA: Diagnosis not present

## 2017-07-17 DIAGNOSIS — Z0000 Encounter for general adult medical examination without abnormal findings: Secondary | ICD-10-CM | POA: Diagnosis not present

## 2017-08-16 DIAGNOSIS — M25519 Pain in unspecified shoulder: Secondary | ICD-10-CM | POA: Diagnosis not present

## 2017-08-16 DIAGNOSIS — I10 Essential (primary) hypertension: Secondary | ICD-10-CM | POA: Diagnosis not present

## 2017-08-16 DIAGNOSIS — Z23 Encounter for immunization: Secondary | ICD-10-CM | POA: Diagnosis not present

## 2017-09-22 DIAGNOSIS — F329 Major depressive disorder, single episode, unspecified: Secondary | ICD-10-CM | POA: Diagnosis not present

## 2017-09-22 DIAGNOSIS — E785 Hyperlipidemia, unspecified: Secondary | ICD-10-CM | POA: Diagnosis not present

## 2017-09-22 DIAGNOSIS — I10 Essential (primary) hypertension: Secondary | ICD-10-CM | POA: Diagnosis not present

## 2017-09-22 DIAGNOSIS — Z7982 Long term (current) use of aspirin: Secondary | ICD-10-CM | POA: Diagnosis not present

## 2017-09-22 DIAGNOSIS — M25511 Pain in right shoulder: Secondary | ICD-10-CM | POA: Diagnosis not present

## 2017-09-22 DIAGNOSIS — D649 Anemia, unspecified: Secondary | ICD-10-CM | POA: Diagnosis not present

## 2017-09-22 DIAGNOSIS — M25512 Pain in left shoulder: Secondary | ICD-10-CM | POA: Diagnosis not present

## 2017-09-22 DIAGNOSIS — R2681 Unsteadiness on feet: Secondary | ICD-10-CM | POA: Diagnosis not present

## 2017-09-22 DIAGNOSIS — Z9181 History of falling: Secondary | ICD-10-CM | POA: Diagnosis not present

## 2017-09-29 DIAGNOSIS — I10 Essential (primary) hypertension: Secondary | ICD-10-CM | POA: Diagnosis not present

## 2017-09-29 DIAGNOSIS — M25511 Pain in right shoulder: Secondary | ICD-10-CM | POA: Diagnosis not present

## 2017-09-29 DIAGNOSIS — E785 Hyperlipidemia, unspecified: Secondary | ICD-10-CM | POA: Diagnosis not present

## 2017-09-29 DIAGNOSIS — F329 Major depressive disorder, single episode, unspecified: Secondary | ICD-10-CM | POA: Diagnosis not present

## 2017-09-29 DIAGNOSIS — R2681 Unsteadiness on feet: Secondary | ICD-10-CM | POA: Diagnosis not present

## 2017-09-29 DIAGNOSIS — M25512 Pain in left shoulder: Secondary | ICD-10-CM | POA: Diagnosis not present

## 2017-10-02 DIAGNOSIS — F329 Major depressive disorder, single episode, unspecified: Secondary | ICD-10-CM | POA: Diagnosis not present

## 2017-10-02 DIAGNOSIS — E785 Hyperlipidemia, unspecified: Secondary | ICD-10-CM | POA: Diagnosis not present

## 2017-10-02 DIAGNOSIS — I10 Essential (primary) hypertension: Secondary | ICD-10-CM | POA: Diagnosis not present

## 2017-10-02 DIAGNOSIS — R2681 Unsteadiness on feet: Secondary | ICD-10-CM | POA: Diagnosis not present

## 2017-10-02 DIAGNOSIS — M25511 Pain in right shoulder: Secondary | ICD-10-CM | POA: Diagnosis not present

## 2017-10-02 DIAGNOSIS — M25512 Pain in left shoulder: Secondary | ICD-10-CM | POA: Diagnosis not present

## 2017-10-04 DIAGNOSIS — R2681 Unsteadiness on feet: Secondary | ICD-10-CM | POA: Diagnosis not present

## 2017-10-04 DIAGNOSIS — E785 Hyperlipidemia, unspecified: Secondary | ICD-10-CM | POA: Diagnosis not present

## 2017-10-04 DIAGNOSIS — M25511 Pain in right shoulder: Secondary | ICD-10-CM | POA: Diagnosis not present

## 2017-10-04 DIAGNOSIS — I10 Essential (primary) hypertension: Secondary | ICD-10-CM | POA: Diagnosis not present

## 2017-10-04 DIAGNOSIS — F329 Major depressive disorder, single episode, unspecified: Secondary | ICD-10-CM | POA: Diagnosis not present

## 2017-10-04 DIAGNOSIS — M25512 Pain in left shoulder: Secondary | ICD-10-CM | POA: Diagnosis not present

## 2017-10-11 DIAGNOSIS — M25512 Pain in left shoulder: Secondary | ICD-10-CM | POA: Diagnosis not present

## 2017-10-11 DIAGNOSIS — E785 Hyperlipidemia, unspecified: Secondary | ICD-10-CM | POA: Diagnosis not present

## 2017-10-11 DIAGNOSIS — R2681 Unsteadiness on feet: Secondary | ICD-10-CM | POA: Diagnosis not present

## 2017-10-11 DIAGNOSIS — I10 Essential (primary) hypertension: Secondary | ICD-10-CM | POA: Diagnosis not present

## 2017-10-11 DIAGNOSIS — F329 Major depressive disorder, single episode, unspecified: Secondary | ICD-10-CM | POA: Diagnosis not present

## 2017-10-11 DIAGNOSIS — M25511 Pain in right shoulder: Secondary | ICD-10-CM | POA: Diagnosis not present

## 2017-10-17 DIAGNOSIS — F329 Major depressive disorder, single episode, unspecified: Secondary | ICD-10-CM | POA: Diagnosis not present

## 2017-10-17 DIAGNOSIS — R2681 Unsteadiness on feet: Secondary | ICD-10-CM | POA: Diagnosis not present

## 2017-10-17 DIAGNOSIS — M25512 Pain in left shoulder: Secondary | ICD-10-CM | POA: Diagnosis not present

## 2017-10-17 DIAGNOSIS — I10 Essential (primary) hypertension: Secondary | ICD-10-CM | POA: Diagnosis not present

## 2017-10-17 DIAGNOSIS — M25511 Pain in right shoulder: Secondary | ICD-10-CM | POA: Diagnosis not present

## 2017-10-17 DIAGNOSIS — E785 Hyperlipidemia, unspecified: Secondary | ICD-10-CM | POA: Diagnosis not present

## 2017-10-20 DIAGNOSIS — M25511 Pain in right shoulder: Secondary | ICD-10-CM | POA: Diagnosis not present

## 2017-10-20 DIAGNOSIS — F329 Major depressive disorder, single episode, unspecified: Secondary | ICD-10-CM | POA: Diagnosis not present

## 2017-10-20 DIAGNOSIS — R2681 Unsteadiness on feet: Secondary | ICD-10-CM | POA: Diagnosis not present

## 2017-10-20 DIAGNOSIS — M25512 Pain in left shoulder: Secondary | ICD-10-CM | POA: Diagnosis not present

## 2017-10-20 DIAGNOSIS — E785 Hyperlipidemia, unspecified: Secondary | ICD-10-CM | POA: Diagnosis not present

## 2017-10-20 DIAGNOSIS — I10 Essential (primary) hypertension: Secondary | ICD-10-CM | POA: Diagnosis not present

## 2018-01-17 DIAGNOSIS — M7532 Calcific tendinitis of left shoulder: Secondary | ICD-10-CM | POA: Diagnosis not present

## 2018-01-17 DIAGNOSIS — M7542 Impingement syndrome of left shoulder: Secondary | ICD-10-CM | POA: Diagnosis not present

## 2018-01-24 DIAGNOSIS — E785 Hyperlipidemia, unspecified: Secondary | ICD-10-CM | POA: Diagnosis not present

## 2018-01-24 DIAGNOSIS — I10 Essential (primary) hypertension: Secondary | ICD-10-CM | POA: Diagnosis not present

## 2018-01-24 DIAGNOSIS — K5900 Constipation, unspecified: Secondary | ICD-10-CM | POA: Diagnosis not present

## 2018-01-26 ENCOUNTER — Encounter (HOSPITAL_COMMUNITY): Payer: Self-pay | Admitting: Emergency Medicine

## 2018-01-26 ENCOUNTER — Other Ambulatory Visit: Payer: Self-pay

## 2018-01-26 ENCOUNTER — Emergency Department (HOSPITAL_COMMUNITY)
Admission: EM | Admit: 2018-01-26 | Discharge: 2018-01-26 | Disposition: A | Discharge status: Home / Self Care | Payer: Medicare Other | Attending: Emergency Medicine | Admitting: Emergency Medicine

## 2018-01-26 ENCOUNTER — Ambulatory Visit (HOSPITAL_COMMUNITY)
Admission: EM | Admit: 2018-01-26 | Discharge: 2018-01-26 | Disposition: A | Discharge status: Home / Self Care | Payer: Medicare Other

## 2018-01-26 ENCOUNTER — Emergency Department (HOSPITAL_COMMUNITY): Payer: Medicare Other

## 2018-01-26 DIAGNOSIS — K8020 Calculus of gallbladder without cholecystitis without obstruction: Secondary | ICD-10-CM | POA: Diagnosis not present

## 2018-01-26 DIAGNOSIS — Z79899 Other long term (current) drug therapy: Secondary | ICD-10-CM | POA: Diagnosis not present

## 2018-01-26 DIAGNOSIS — Z7982 Long term (current) use of aspirin: Secondary | ICD-10-CM | POA: Insufficient documentation

## 2018-01-26 DIAGNOSIS — Z87891 Personal history of nicotine dependence: Secondary | ICD-10-CM | POA: Diagnosis not present

## 2018-01-26 DIAGNOSIS — R109 Unspecified abdominal pain: Secondary | ICD-10-CM | POA: Insufficient documentation

## 2018-01-26 DIAGNOSIS — K921 Melena: Secondary | ICD-10-CM | POA: Insufficient documentation

## 2018-01-26 DIAGNOSIS — I10 Essential (primary) hypertension: Secondary | ICD-10-CM | POA: Diagnosis not present

## 2018-01-26 LAB — CBC WITH DIFFERENTIAL/PLATELET
BASOS ABS: 0 10*3/uL (ref 0.0–0.1)
BASOS PCT: 0 %
EOS ABS: 0.2 10*3/uL (ref 0.0–0.7)
EOS PCT: 2 %
HCT: 34.9 % — ABNORMAL LOW (ref 39.0–52.0)
Hemoglobin: 11.5 g/dL — ABNORMAL LOW (ref 13.0–17.0)
Lymphocytes Relative: 23 %
Lymphs Abs: 1.5 10*3/uL (ref 0.7–4.0)
MCH: 28.8 pg (ref 26.0–34.0)
MCHC: 33 g/dL (ref 30.0–36.0)
MCV: 87.3 fL (ref 78.0–100.0)
MONO ABS: 0.4 10*3/uL (ref 0.1–1.0)
MONOS PCT: 6 %
Neutro Abs: 4.5 10*3/uL (ref 1.7–7.7)
Neutrophils Relative %: 69 %
Platelets: 219 10*3/uL (ref 150–400)
RBC: 4 MIL/uL — ABNORMAL LOW (ref 4.22–5.81)
RDW: 12.4 % (ref 11.5–15.5)
WBC: 6.6 10*3/uL (ref 4.0–10.5)

## 2018-01-26 LAB — COMPREHENSIVE METABOLIC PANEL
ALBUMIN: 4.1 g/dL (ref 3.5–5.0)
ALT: 17 U/L (ref 17–63)
ANION GAP: 11 (ref 5–15)
AST: 17 U/L (ref 15–41)
Alkaline Phosphatase: 61 U/L (ref 38–126)
BILIRUBIN TOTAL: 0.7 mg/dL (ref 0.3–1.2)
BUN: 19 mg/dL (ref 6–20)
CHLORIDE: 103 mmol/L (ref 101–111)
CO2: 22 mmol/L (ref 22–32)
Calcium: 9.3 mg/dL (ref 8.9–10.3)
Creatinine, Ser: 1.15 mg/dL (ref 0.61–1.24)
GFR calc Af Amer: >60 mL/min (ref >60)
GFR calc non Af Amer: 60 mL/min — ABNORMAL LOW (ref >60)
GLUCOSE: 97 mg/dL (ref 65–99)
POTASSIUM: 4.2 mmol/L (ref 3.5–5.1)
SODIUM: 136 mmol/L (ref 135–145)
TOTAL PROTEIN: 6.6 g/dL (ref 6.5–8.1)

## 2018-01-26 LAB — I-STAT CHEM 8, ED
BUN: 20 mg/dL (ref 6–20)
CREATININE: 1.1 mg/dL (ref 0.61–1.24)
Calcium, Ion: 1.16 mmol/L (ref 1.15–1.40)
Chloride: 103 mmol/L (ref 101–111)
GLUCOSE: 93 mg/dL (ref 65–99)
HEMATOCRIT: 35 % — AB (ref 39.0–52.0)
HEMOGLOBIN: 11.9 g/dL — AB (ref 13.0–17.0)
Potassium: 4.1 mmol/L (ref 3.5–5.1)
Sodium: 137 mmol/L (ref 135–145)
TCO2: 24 mmol/L (ref 22–32)

## 2018-01-26 LAB — URINALYSIS, ROUTINE W REFLEX MICROSCOPIC
Bilirubin Urine: NEGATIVE
GLUCOSE, UA: NEGATIVE mg/dL
HGB URINE DIPSTICK: NEGATIVE
Ketones, ur: NEGATIVE mg/dL
Leukocytes, UA: NEGATIVE
Nitrite: NEGATIVE
Protein, ur: NEGATIVE mg/dL
SPECIFIC GRAVITY, URINE: 1.019 (ref 1.005–1.030)
pH: 5 (ref 5.0–8.0)

## 2018-01-26 LAB — LIPASE, BLOOD: Lipase: 34 U/L (ref 11–51)

## 2018-01-26 LAB — POC OCCULT BLOOD, ED: Fecal Occult Bld: POSITIVE — AB

## 2018-01-26 LAB — ABO/RH: ABO/RH(D): B POS

## 2018-01-26 LAB — TYPE AND SCREEN
ABO/RH(D): B POS
Antibody Screen: NEGATIVE

## 2018-01-26 MED ORDER — SODIUM CHLORIDE 0.9 % IV BOLUS (SEPSIS)
500.0000 mL | Freq: Once | INTRAVENOUS | Status: AC
  Administered 2018-01-26: 500 mL via INTRAVENOUS

## 2018-01-26 MED ORDER — IOPAMIDOL (ISOVUE-300) INJECTION 61%
INTRAVENOUS | Status: AC
  Administered 2018-01-26: 100 mL
  Filled 2018-01-26: qty 100

## 2018-01-26 MED ORDER — SODIUM CHLORIDE 0.9 % IV BOLUS (SEPSIS)
1000.0000 mL | Freq: Once | INTRAVENOUS | Status: AC
  Administered 2018-01-26: 1000 mL via INTRAVENOUS

## 2018-01-26 MED ORDER — ESOMEPRAZOLE MAGNESIUM 40 MG PO CPDR: 40.0000 mg | DELAYED_RELEASE_CAPSULE | Freq: Every day | ORAL | 0 refills | Status: DC

## 2018-01-26 MED ORDER — PANTOPRAZOLE SODIUM 40 MG IV SOLR
40.0000 mg | Freq: Once | INTRAVENOUS | Status: AC
Start: 2018-01-26 — End: 2018-01-26
  Administered 2018-01-26: 40 mg via INTRAVENOUS
  Filled 2018-01-26: qty 40

## 2018-01-26 NOTE — Discharge Instructions (Signed)
Your workup today was reassuring, but did show some blood in your stool.  We believe this is from prolonged use of anti-inflammatory medications.  Avoid Aleve, ibuprofen, naproxen.  We recommend that you take Nexium as prescribed for symptom management.  Follow-up with a gastroenterologist for further evaluation of your symptoms.  You may also follow-up with your primary care doctor for symptom recheck in 1 week.  Return to the emergency department if symptoms persist or worsen.

## 2018-01-26 NOTE — ED Notes (Signed)
ED Provider at bedside. 

## 2018-01-26 NOTE — ED Notes (Signed)
Pt states he has been taking pepto bismol and "nitro citrate", it has been working as designed but pt does not like the way it has changed his stool consistency. Pt reports receiving a cortisone shot in his shoulder last week and thinks that he has had a bad GI reaction to the steroid.

## 2018-01-26 NOTE — ED Provider Notes (Addendum)
[facility] EMERGENCY DEPARTMENT Provider Note   CSN: [id number] Arrival date & time: 01/26/18  1807     History   Chief Complaint Chief Complaint  Patient presents with  . Flank Pain    HPI Devin Valdez is a 77 y.o. male hx of HTN, anemia, here with abdominal pain, blood in stool.  Patient states that a little over a week ago, he had a cortisone shot in his shoulder and was started on naproxen.  He took several pills of naproxen and then began to have some abdominal pain.  States that the pain is more left-sided and also had an episode of bright red blood per rectum 5 days ago that spontaneously resolved.  Over the last several days he has intermittent left-sided abdominal pain.  Today, he noticed some black stools but no further bright red blood per rectum.  Patient has not been taking any naproxen this week and is not currently taking aspirin or any blood thinners.  Denies any chest pain or passing out or fevers.   The history is provided by the patient.    Past Medical History:  Diagnosis Date  . Depression   . Falls   . Hallucinations   . Hypertension     Patient Active Problem List   Diagnosis Date Noted  . Failure to thrive in adult 05/26/2017  . Failure to thrive (0-17) 05/25/2017  . Abdominal pulsatile mass 05/25/2017  . Anemia 05/25/2017  . Essential hypertension, benign 05/25/2017  . Gait instability 05/20/2017  . Frequent falls 05/20/2017  . Depression 05/20/2017    Past Surgical History:  Procedure Laterality Date  . EYE SURGERY         Home Medications    Prior to Admission medications   Medication Sig Start Date End Date Taking? Authorizing Provider  Alum & Mag Hydroxide-Simeth (GI COCKTAIL) SUSP suspension Take 30 mLs by mouth 3 (three) times daily as needed for indigestion. Each 30 mL dose contains Maalox 12 mL, Viscous Lidocaine 12 mL, Donnatal 6 mL 05/28/17   Theodis Blaze, MD  aspirin EC 81 MG tablet Take 81 mg by mouth  daily.    [provider]  escitalopram (LEXAPRO) 10 MG tablet Take 1 tablet (10 mg total) by mouth daily. Patient not taking: Reported on 05/25/2017 05/21/17   Cristal Ford, DO  ferrous sulfate 325 (65 FE) MG tablet Take 1 tablet (325 mg total) by mouth 2 (two) times daily with a meal. 05/28/17   Theodis Blaze, MD  losartan-hydrochlorothiazide (HYZAAR) 50-12.5 MG tablet Take 1 tablet by mouth daily. 02/17/17   [provider]  meclizine (ANTIVERT) 12.5 MG tablet Take 1 tablet (12.5 mg total) by mouth 3 (three) times daily as needed for dizziness. 05/23/17   Waynetta Pean, PA-C  Multiple Vitamins-Minerals (CENTRUM SILVER ADULT 50+) TABS Take 1 capsule by mouth 2 (two) times daily. Patient taking differently: Take 1 tablet by mouth daily.  01/22/16   Billy Fischer, MD  traMADol (ULTRAM) 50 MG tablet Take 1 tablet (50 mg total) by mouth every 6 (six) hours as needed for moderate pain. 05/28/17   Theodis Blaze, MD    Family History History reviewed. No pertinent family history.  Social History Social History   Tobacco Use  . Smoking status: Former Smoker    Last attempt to quit: 11/21/1985    Years since quitting: 32.2  . Smokeless tobacco: Never Used  Substance Use Topics  . Alcohol use: No  .  Drug use: No     Allergies   Patient has no known allergies.   Review of Systems Review of Systems  Gastrointestinal: Positive for abdominal pain.  All other systems reviewed and are negative.    Physical Exam Updated Vital Signs BP (!) 148/76   Pulse (!) 58   Temp 98.5 F (36.9 C) (Oral)   Resp 18   Ht 5\' 7"  (1.702 m)   Wt 68 kg (150 lb)   SpO2 100%   BMI 23.49 kg/m   Physical Exam  Constitutional: He is oriented to person, place, and time. He appears well-developed and well-nourished.  HENT:  Head: Normocephalic.  Mouth/Throat: Oropharynx is clear and moist.  Eyes: Conjunctivae and EOM are normal. Pupils are equal, round, and reactive to light.  Neck: Normal  range of motion. Neck supple.  Cardiovascular: Normal rate, regular rhythm and normal heart sounds.  Pulmonary/Chest: Effort normal and breath sounds normal. No stridor. No respiratory distress. He has no wheezes.  Abdominal: Soft. Bowel sounds are normal.  Mild epigastric and LUQ and LLQ tenderness, no rebound   Genitourinary:  Genitourinary Comments: No obvious external hemorrhoids, dark stool   Musculoskeletal: Normal range of motion.  Neurological: He is alert and oriented to person, place, and time.  Skin: Skin is warm.  Psychiatric: He has a normal mood and affect.  Nursing note and vitals reviewed.    ED Treatments / Results  Labs (all labs ordered are listed, but only abnormal results are displayed) Labs Reviewed  CBC WITH DIFFERENTIAL/PLATELET - Abnormal; Notable for the following components:      Result Value   RBC 4.00 (*)    Hemoglobin 11.5 (*)    HCT 34.9 (*)    All other components within normal limits  COMPREHENSIVE METABOLIC PANEL - Abnormal; Notable for the following components:   GFR calc non Af Amer 60 (*)    All other components within normal limits  POC OCCULT BLOOD, ED - Abnormal; Notable for the following components:   Fecal Occult Bld POSITIVE (*)    All other components within normal limits  I-STAT CHEM 8, ED - Abnormal; Notable for the following components:   Hemoglobin 11.9 (*)    HCT 35.0 (*)    All other components within normal limits  URINALYSIS, ROUTINE W REFLEX MICROSCOPIC  LIPASE, BLOOD  TYPE AND SCREEN  ABO/RH    EKG  EKG Interpretation None       Radiology No results found.  Procedures Procedures (including critical care time)  Medications Ordered in ED Medications  pantoprazole (PROTONIX) injection 40 mg (not administered)  sodium chloride 0.9 % bolus 1,000 mL (not administered)  sodium chloride 0.9 % bolus 500 mL (500 mLs Intravenous New Bag/Given 01/26/18 2108)  iopamidol (ISOVUE-300) 61 % injection (100 mLs  Contrast  Given 01/26/18 2151)     Initial Impression / Assessment and Plan / ED Course  I have reviewed the triage vital signs and the nursing notes.  Pertinent labs & imaging results that were available during my care of the patient were reviewed by me and considered in my medical decision making (see chart for details).     Devin Valdez is a 77 y.o. male here with abdominal pain, blood in stool, ? Melena. He did take naprosyn a week ago but stopped taking it. He also has some L sided abdominal pain. Consider colitis vs gastritis with bleeding. He is well appearing and afebrile. Will get CBC, CMP, CT ab/pel.  10:08 PM CBC showed Hg 11.5, baseline. Occ positive. I suspect from colitis vs recent NSAID use. Since he is off NSAIDs for a week and is hemodynamically stable, if CT unremarkable, anticipate dc home with nexium. If CT showed colitis, can give course of abx. He will need GI follow up. Signed out to [employer] in the ED.    Final Clinical Impressions(s) / ED Diagnoses   Final diagnoses:  None    ED Discharge Orders    None       Drenda Freeze, MD 01/26/18 2206    Drenda Freeze, MD 01/26/18 2209

## 2018-01-26 NOTE — ED Provider Notes (Signed)
10:54 PM Patient care assumed from Dr. Darl Householder at change of shift.  Patient pending CT scan to evaluate abdominal pain.  He has a history of melena with heme positive stool today.  Stable hemoglobin.  No tachycardia or hypotension.  He is afebrile.  Imaging reviewed which shows no evidence of intestinal inflammation.  No acute abnormality to explain the patient's symptoms.  There is diffuse aortic atherosclerosis which is likely secondary to patient's age.  Remainder of the findings are chronic and stable compared with prior workups.  Results reviewed with patient who verbalizes understanding.  Plan for discharge with gastroenterology referral and follow-up.  Will start on Nexium.  Return precautions discussed and provided. Patient discharged in stable condition with no unaddressed concerns.  Results for orders placed or performed during the hospital encounter of 01/26/18  Urinalysis, Routine w reflex microscopic- may I&O cath if menses  Result Value Ref Range   Color, Urine YELLOW YELLOW   APPearance CLEAR CLEAR   Specific Gravity, Urine 1.019 1.005 - 1.030   pH 5.0 5.0 - 8.0   Glucose, UA NEGATIVE NEGATIVE mg/dL   Hgb urine dipstick NEGATIVE NEGATIVE   Bilirubin Urine NEGATIVE NEGATIVE   Ketones, ur NEGATIVE NEGATIVE mg/dL   Protein, ur NEGATIVE NEGATIVE mg/dL   Nitrite NEGATIVE NEGATIVE   Leukocytes, UA NEGATIVE NEGATIVE  CBC with Differential/Platelet  Result Value Ref Range   WBC 6.6 4.0 - 10.5 K/uL   RBC 4.00 (L) 4.22 - 5.81 MIL/uL   Hemoglobin 11.5 (L) 13.0 - 17.0 g/dL   HCT 34.9 (L) 39.0 - 52.0 %   MCV 87.3 78.0 - 100.0 fL   MCH 28.8 26.0 - 34.0 pg   MCHC 33.0 30.0 - 36.0 g/dL   RDW 12.4 11.5 - 15.5 %   Platelets 219 150 - 400 K/uL   Neutrophils Relative % 69 %   Neutro Abs 4.5 1.7 - 7.7 K/uL   Lymphocytes Relative 23 %   Lymphs Abs 1.5 0.7 - 4.0 K/uL   Monocytes Relative 6 %   Monocytes Absolute 0.4 0.1 - 1.0 K/uL   Eosinophils Relative 2 %   Eosinophils Absolute 0.2 0.0 -  0.7 K/uL   Basophils Relative 0 %   Basophils Absolute 0.0 0.0 - 0.1 K/uL  Comprehensive metabolic panel  Result Value Ref Range   Sodium 136 135 - 145 mmol/L   Potassium 4.2 3.5 - 5.1 mmol/L   Chloride 103 101 - 111 mmol/L   CO2 22 22 - 32 mmol/L   Glucose, Bld 97 65 - 99 mg/dL   BUN 19 6 - 20 mg/dL   Creatinine, Ser 1.15 0.61 - 1.24 mg/dL   Calcium 9.3 8.9 - 10.3 mg/dL   Total Protein 6.6 6.5 - 8.1 g/dL   Albumin 4.1 3.5 - 5.0 g/dL   AST 17 15 - 41 U/L   ALT 17 17 - 63 U/L   Alkaline Phosphatase 61 38 - 126 U/L   Total Bilirubin 0.7 0.3 - 1.2 mg/dL   GFR calc non Af Amer 60 (L) >60 mL/min   GFR calc Af Amer >60 >60 mL/min   Anion gap 11 5 - 15  Lipase, blood  Result Value Ref Range   Lipase 34 11 - 51 U/L  POC occult blood, ED  Result Value Ref Range   Fecal Occult Bld POSITIVE (A) NEGATIVE  I-stat chem 8, ed  Result Value Ref Range   Sodium 137 135 - 145 mmol/L   Potassium 4.1 3.5 -  5.1 mmol/L   Chloride 103 101 - 111 mmol/L   BUN 20 6 - 20 mg/dL   Creatinine, Ser 1.10 0.61 - 1.24 mg/dL   Glucose, Bld 93 65 - 99 mg/dL   Calcium, Ion 1.16 1.15 - 1.40 mmol/L   TCO2 24 22 - 32 mmol/L   Hemoglobin 11.9 (L) 13.0 - 17.0 g/dL   HCT 35.0 (L) 39.0 - 52.0 %  Type and screen  Result Value Ref Range   ABO/RH(D) B POS    Antibody Screen NEG    Sample Expiration      01/29/2018 Performed at [facility] [address], [place], [place] [address]   ABO/Rh  Result Value Ref Range   ABO/RH(D)      B POS Performed at [facility] [address], [place], [place] [address]    Ct Abdomen Pelvis W Contrast  Result Date: 01/26/2018 CLINICAL DATA:  Acute onset of generalized abdominal pain. EXAM: CT ABDOMEN AND PELVIS WITH CONTRAST TECHNIQUE: Multidetector CT imaging of the abdomen and pelvis was performed using the standard protocol following bolus administration of intravenous contrast. CONTRAST:  153mL ISOVUE-300 IOPAMIDOL (ISOVUE-300) INJECTION 61% COMPARISON:   CT of the abdomen and pelvis from 05/25/2017 FINDINGS: Lower chest: Scattered coronary artery calcifications are seen. A small calcified granuloma is noted at the right lung base. The visualized lung bases are otherwise clear. Hepatobiliary: A few small nonspecific hypodensities are seen within the periphery of the liver, measuring up to 8 mm in size. The liver is otherwise grossly unremarkable. The gallbladder is partially decompressed and contains a few small stones. The common bile duct is normal in caliber. Pancreas: The pancreas is within normal limits. Spleen: The spleen is unremarkable in appearance. Adrenals/Urinary Tract: A 2.1 cm left adrenal lesion is stable in appearance. The right adrenal gland is unremarkable. 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 The right kidney is unremarkable in appearance. There is no evidence of hydronephrosis. No renal or ureteral stones are identified. No perinephric stranding is appreciated. Stomach/Bowel: The stomach is unremarkable in appearance. The small bowel is within normal limits. The appendix is normal in caliber, without evidence of appendicitis. The colon is unremarkable in appearance. Vascular/Lymphatic: Diffuse calcification is seen along the abdominal aorta and its branches, including dense calcification along the proximal right renal artery. The abdominal aorta is otherwise grossly unremarkable. The inferior vena cava is grossly unremarkable. No retroperitoneal lymphadenopathy is seen. No pelvic sidewall lymphadenopathy is identified. Reproductive: The bladder is moderately distended and grossly unremarkable. The prostate remains normal in size. Other: No additional soft tissue abnormalities are seen. Musculoskeletal: No acute osseous abnormalities are identified. The visualized musculature is unremarkable in appearance. IMPRESSION: 1. No acute abnormality seen to explain the patient's symptoms. 2. Diffuse aortic atherosclerosis, with dense  calcification along the proximal renal artery. 3. 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 4. 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 5. Few small nonspecific hypodensities in the periphery of the liver are grossly unchanged in appearance. 6. Scattered coronary artery calcifications noted. 7. Cholelithiasis.  Gallbladder otherwise unremarkable. Aortic Atherosclerosis (ICD10-I70.0). Electronically Signed   By: Garald Balding M.D.   On: 01/26/2018 22:14      Antonietta Breach, PA-C 01/26/18 2256    Valarie Merino, MD 01/27/18 [phone]

## 2018-01-26 NOTE — ED Triage Notes (Signed)
Pt c/o abd pain and requesting testing that is not available in office; pt elected to go to ER for further eval

## 2018-01-26 NOTE — ED Triage Notes (Signed)
Patient c/o lower back pain. No urinary problems at this time.  He has been taking iron supplements for anemia and was constipated for a few days but does not report abdominal pain at this time. His daughter gave him laxatives and he reports his stool as being dark and thin. AOx4 and in no acute distress.

## 2018-04-05 ENCOUNTER — Encounter: Payer: Self-pay | Admitting: *Deleted

## 2018-04-30 ENCOUNTER — Encounter: Payer: Self-pay | Admitting: Family Medicine

## 2018-04-30 ENCOUNTER — Ambulatory Visit (INDEPENDENT_AMBULATORY_CARE_PROVIDER_SITE_OTHER): Payer: Medicare Other | Admitting: Family Medicine

## 2018-04-30 VITALS — BP 170/80 | HR 70 | Temp 98.2°F | Resp 16 | Ht 67.0 in | Wt 150.0 lb

## 2018-04-30 DIAGNOSIS — D509 Iron deficiency anemia, unspecified: Secondary | ICD-10-CM | POA: Diagnosis not present

## 2018-04-30 DIAGNOSIS — Z7689 Persons encountering health services in other specified circumstances: Secondary | ICD-10-CM | POA: Diagnosis not present

## 2018-04-30 DIAGNOSIS — I10 Essential (primary) hypertension: Secondary | ICD-10-CM | POA: Diagnosis not present

## 2018-04-30 DIAGNOSIS — R443 Hallucinations, unspecified: Secondary | ICD-10-CM | POA: Diagnosis not present

## 2018-04-30 LAB — COMPLETE METABOLIC PANEL WITH GFR
AG Ratio: 2.1 (calc) (ref 1.0–2.5)
ALT: 15 U/L (ref 9–46)
AST: 15 U/L (ref 10–35)
Albumin: 4.6 g/dL (ref 3.6–5.1)
Alkaline phosphatase (APISO): 62 U/L (ref 40–115)
BUN / CREAT RATIO: 13 (calc) (ref 6–22)
BUN: 17 mg/dL (ref 7–25)
CO2: 26 mmol/L (ref 20–32)
CREATININE: 1.26 mg/dL — AB (ref 0.70–1.18)
Calcium: 10.1 mg/dL (ref 8.6–10.3)
Chloride: 104 mmol/L (ref 98–110)
GFR, EST AFRICAN AMERICAN: 64 mL/min/{1.73_m2} (ref >=60)
GFR, Est Non African American: 55 mL/min/{1.73_m2} — ABNORMAL LOW (ref >=60)
GLOBULIN: 2.2 g/dL (ref 1.9–3.7)
GLUCOSE: 107 mg/dL — AB (ref 65–99)
Potassium: 4.6 mmol/L (ref 3.5–5.3)
SODIUM: 140 mmol/L (ref 135–146)
TOTAL PROTEIN: 6.8 g/dL (ref 6.1–8.1)
Total Bilirubin: 0.6 mg/dL (ref 0.2–1.2)

## 2018-04-30 LAB — LIPID PANEL
CHOL/HDL RATIO: 3.5 (calc) (ref <5.0)
Cholesterol: 224 mg/dL — ABNORMAL HIGH (ref <200)
HDL: 64 mg/dL (ref >40)
LDL CHOLESTEROL (CALC): 138 mg/dL — AB
NON-HDL CHOLESTEROL (CALC): 160 mg/dL — AB (ref <130)
Triglycerides: 104 mg/dL (ref <150)

## 2018-04-30 LAB — CBC WITH DIFFERENTIAL/PLATELET
Basophils Absolute: 19 cells/uL (ref 0–200)
Basophils Relative: 0.4 %
Eosinophils Absolute: 91 cells/uL (ref 15–500)
Eosinophils Relative: 1.9 %
HCT: 35.4 % — ABNORMAL LOW (ref 38.5–50.0)
Hemoglobin: 11.7 g/dL — ABNORMAL LOW (ref 13.2–17.1)
Lymphs Abs: 725 cells/uL — ABNORMAL LOW (ref 850–3900)
MCH: 28.1 pg (ref 27.0–33.0)
MCHC: 33.1 g/dL (ref 32.0–36.0)
MCV: 85.1 fL (ref 80.0–100.0)
MPV: 10.2 fL (ref 7.5–12.5)
Monocytes Relative: 11 %
NEUTROS PCT: 71.6 %
Neutro Abs: 3437 cells/uL (ref 1500–7800)
Platelets: 221 10*3/uL (ref 140–400)
RBC: 4.16 10*6/uL — AB (ref 4.20–5.80)
RDW: 11.5 % (ref 11.0–15.0)
TOTAL LYMPHOCYTE: 15.1 %
WBC: 4.8 10*3/uL (ref 3.8–10.8)
WBCMIX: 528 {cells}/uL (ref 200–950)

## 2018-04-30 MED ORDER — ARIPIPRAZOLE 5 MG PO TABS: 5.0000 mg | ORAL_TABLET | Freq: Every day | ORAL | 3 refills | Status: DC

## 2018-04-30 NOTE — Progress Notes (Signed)
Subjective:    Patient ID: Devin Valdez, male    DOB: 1941-07-17, 77 y.o.   MRN: [id number]  HPI Patient is here today unaccompanied to establish care.  Patient states that he wanted to change doctors because he believes his previous doctors were conspiring against him.  Obviously this statement is very concerning causing me to probe deeper into his past medical history.  Patient states that approximately 1 year ago, he had a breakdown.  He states that he was in a restaurant and two men  told him that he was about to eat his last meal.  No one else saw these men.  He went to the restroom and these men follow him to the restroom and then repeated the statement to him.  This terrified the patient.  He was unable to tell his wife because he felt that she would not believe him.  He was unable to sleep at night because of the fear of dying.  He states that he would have dreams that would reiterate that he was about to die.  However he was uncertain if they were dreams or if they were hallucinations.  He also reports fears and concerns that sound like delusions.  These delusions would cause him not to eat.  He lost down to 125 pounds.  These fears and delusions made him extremely depressed.  He had no energy.  He would want to leave the home.  He could not sleep.  He denies any suicidal ideation.  His history is very difficult to follow.  He has very tangential thoughts.  It is difficult to follow his rationale and his train of thought.  I frequently have to redirect him to gain more information and more insight into what he is referring to.  I see that approximately 1 year ago, he was in the emergency room in the hospital for vertigo and gait instability.  MRI showed chronic microvascular changes but no explanation.  There are several mentions of hallucination and underlying psychiatric paranoia and the patient was referred to a psychiatrist however he stopped going to her.  Past medical history is also  significant for hypertension although he states that his blood pressure is high here because he is nervous and anxious in the office.  He also has a history of iron deficiency anemia.  He states that he is overdue for a colonoscopy.  He is overdue for prostate exam.  He is uncertain of his immunization status. Past Medical History:  Diagnosis Date  . Anemia   . Anxiety   . Depression   . Falls   . Hallucinations   . Hyperlipidemia   . Hypertension    Past Surgical History:  Procedure Laterality Date  . EYE SURGERY    . HERNIA REPAIR     Current Outpatient Medications on File Prior to Visit  Medication Sig Dispense Refill  . ferrous sulfate 325 (65 FE) MG tablet Take 1 tablet (325 mg total) by mouth 2 (two) times daily with a meal. (Patient taking differently: Take 325 mg by mouth at bedtime. ) 60 tablet 1  . losartan (COZAAR) 50 MG tablet Take 50 mg by mouth daily.     . Multiple Vitamins-Minerals (CENTRUM SILVER ADULT 50+) TABS Take 1 capsule by mouth 2 (two) times daily. 30 tablet 2   No current facility-administered medications on file prior to visit.    No Known Allergies Social History   Socioeconomic History  . Marital status: Married  Spouse name: Not on file  . Number of children: Not on file  . Years of education: Not on file  . Highest education level: Not on file  Occupational History  . Not on file  Social Needs  . Financial resource strain: Not on file  . Food insecurity:    Worry: Not on file    Inability: Not on file  . Transportation needs:    Medical: Not on file    Non-medical: Not on file  Tobacco Use  . Smoking status: Former Smoker    Last attempt to quit: 11/21/1985    Years since quitting: 32.4  . Smokeless tobacco: Never Used  Substance and Sexual Activity  . Alcohol use: No  . Drug use: No  . Sexual activity: Yes  Lifestyle  . Physical activity:    Days per week: Not on file    Minutes per session: Not on file  . Stress: Not on file    Relationships  . Social connections:    Talks on phone: Not on file    Gets together: Not on file    Attends religious service: Not on file    Active member of club or organization: Not on file    Attends meetings of clubs or organizations: Not on file    Relationship status: Not on file  . Intimate partner violence:    Fear of current or ex partner: Not on file    Emotionally abused: Not on file    Physically abused: Not on file    Forced sexual activity: Not on file  Other Topics Concern  . Not on file  Social History Narrative  . Not on file   Family History  Problem Relation Age of Onset  . Multiple sclerosis Son   . Bipolar disorder Son       Review of Systems  All other systems reviewed and are negative.      Objective:   Physical Exam  Constitutional: He is oriented to person, place, and time. He appears well-developed and well-nourished. No distress.  HENT:  Right Ear: External ear normal.  Left Ear: External ear normal.  Nose: Nose normal.  Mouth/Throat: Oropharynx is clear and moist. No oropharyngeal exudate.  Eyes: Pupils are equal, round, and reactive to light. Conjunctivae and EOM are normal. No scleral icterus.  Neck: Normal range of motion. Neck supple. No JVD present. No thyromegaly present.  Cardiovascular: Normal rate, regular rhythm and normal heart sounds. Exam reveals no gallop and no friction rub.  No murmur heard. Pulmonary/Chest: Effort normal and breath sounds normal. No stridor. No respiratory distress. He has no wheezes. He has no rales.  Abdominal: Soft. Bowel sounds are normal. He exhibits no distension and no mass. There is no tenderness. There is no guarding.  Musculoskeletal: Normal range of motion. He exhibits no edema.  Lymphadenopathy:    He has no cervical adenopathy.  Neurological: He is alert and oriented to person, place, and time. He displays normal reflexes. No cranial nerve deficit or sensory deficit. He exhibits normal  muscle tone. Coordination normal.  Skin: No rash noted. He is not diaphoretic. No erythema.  Psychiatric: His behavior is normal. His mood appears anxious. His speech is tangential. Thought content is paranoid. Cognition and memory are normal. He expresses no homicidal and no suicidal ideation. He expresses no suicidal plans and no homicidal plans.  Vitals reviewed.         Assessment & Plan:  Essential hypertension, benign -  Plan: CBC with Differential/Platelet, COMPLETE METABOLIC PANEL WITH GFR, Lipid panel  Iron deficiency anemia, unspecified iron deficiency anemia type - Plan: CBC with Differential/Platelet, COMPLETE METABOLIC PANEL WITH GFR, Lipid panel  Hallucinations  Encounter to establish care with new doctor  As I explained to the patient, he has several concerns including scheduling a colonoscopy, physical, carpal tunnel, vertigo.  However I believe the biggest issue for the patient presently is his underlying paranoia, delusions, and hallucinations.  I feel that if we do not treat this effectively, I will be unable to treat his other medical problems.  Therefore I would like to start the patient on Abilify 5 mg a day and reassess the patient and 3 weeks for improvement.  At that follow-up, we will address his blood pressure hopefully if the patient is more acceptable to treatment.  I will also obtain a CBC, CMP, and a fasting lipid panel.

## 2018-05-21 ENCOUNTER — Ambulatory Visit (INDEPENDENT_AMBULATORY_CARE_PROVIDER_SITE_OTHER): Payer: Medicare Other | Admitting: Family Medicine

## 2018-05-21 ENCOUNTER — Encounter: Payer: Self-pay | Admitting: Family Medicine

## 2018-05-21 VITALS — BP 142/60 | HR 70 | Temp 98.0°F | Resp 16 | Ht 67.0 in | Wt 150.0 lb

## 2018-05-21 DIAGNOSIS — G5603 Carpal tunnel syndrome, bilateral upper limbs: Secondary | ICD-10-CM | POA: Diagnosis not present

## 2018-05-21 DIAGNOSIS — F251 Schizoaffective disorder, depressive type: Secondary | ICD-10-CM

## 2018-05-21 NOTE — Progress Notes (Signed)
Subjective:    Patient ID: Devin Valdez, male    DOB: 1941/02/12, 77 y.o.   MRN: [id number]  HPI  04/30/18 Patient is here today unaccompanied to establish care.  Patient states that he wanted to change doctors because he believes his previous doctors were conspiring against him.  Obviously this statement is very concerning causing me to probe deeper into his past medical history.  Patient states that approximately 1 year ago, he had a breakdown.  He states that he was in a restaurant and two men  told him that he was about to eat his last meal.  No one else saw these men.  He went to the restroom and these men follow him to the restroom and then repeated the statement to him.  This terrified the patient.  He was unable to tell his wife because he felt that she would not believe him.  He was unable to sleep at night because of the fear of dying.  He states that he would have dreams that would reiterate that he was about to die.  However he was uncertain if they were dreams or if they were hallucinations.  He also reports fears and concerns that sound like delusions.  These delusions would cause him not to eat.  He lost down to 125 pounds.  These fears and delusions made him extremely depressed.  He had no energy.  He would want to leave the home.  He could not sleep.  He denies any suicidal ideation.  His history is very difficult to follow.  He has very tangential thoughts.  It is difficult to follow his rationale and his train of thought.  I frequently have to redirect him to gain more information and more insight into what he is referring to.  I see that approximately 1 year ago, he was in the emergency room in the hospital for vertigo and gait instability.  MRI showed chronic microvascular changes but no explanation.  There are several mentions of hallucination and underlying psychiatric paranoia and the patient was referred to a psychiatrist however he stopped going to her.  Past medical history  is also significant for hypertension although he states that his blood pressure is high here because he is nervous and anxious in the office.  He also has a history of iron deficiency anemia.  He states that he is overdue for a colonoscopy.  He is overdue for prostate exam.  He is uncertain of his immunization status.  At that time, my plan was:  As I explained to the patient, he has several concerns including scheduling a colonoscopy, physical, carpal tunnel, vertigo.  However I believe the biggest issue for the patient presently is his underlying paranoia, delusions, and hallucinations.  I feel that if we do not treat this effectively, I will be unable to treat his other medical problems.  Therefore I would like to start the patient on Abilify 5 mg a day and reassess the patient and 3 weeks for improvement.  At that follow-up, we will address his blood pressure hopefully if the patient is more acceptable to treatment.  I will also obtain a CBC, CMP, and a fasting lipid panel.  05/21/18 Patient is here today for follow-up.  He states that he is taking Abilify 5 mg every other day.  He is not able to tolerate it every day because he states that the medication keeps him awake.  Otherwise he denies any side effects from the medication.  I was able to gain further insight into his past history.  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  Apparently at that time, his sister died suddenly.  He was not made aware that his sister had died until a week after she had been cremated.  Neither he nor his mother could visit her prior to her death.  Patient interprets this that the family was keeping him from her as well as his mother from his sister.  His mother died less than a month later.  This caused the patient to spiral into a deep depression.  He believes this is what triggered some of his hallucinations and underlying issues.  However he denies any hallucinations today.  He continues to demonstrate  very tangential speech that is difficult to follow.  He frequently talks and hyperreligious times about the "enemy" attacking him and challenging him.  When I asked him to clarify, he believes the enemy is the devil.  When I asked him how the enemy attacks him, he cannot provide specific instances but speaks in vague generalities about being persecuted by the enemy.  I did review his lab work which was significant for hyperlipidemia however the patient refuses any medication for hyperlipidemia.  His biggest concern today is bilateral hand pain that he attributes to carpal tunnel syndrome.  He reports burning stinging paresthesias and pain radiating from both wrist into the first second third and fourth fingers of both hands.  He also reports decreasing grip strength and weakness in his hands as well as numbness in his hands.  He has a positive Tinel sign bilaterally as well as positive Phalen sign bilaterally.  There is no wasting of the thenar eminence however he does have moderately decreased grip strength Past Medical History:  Diagnosis Date  . Anemia   . Anxiety   . Depression   . Falls   . Hallucinations   . Hyperlipidemia   . Hypertension    Past Surgical History:  Procedure Laterality Date  . EYE SURGERY    . HERNIA REPAIR     Current Outpatient Medications on File Prior to Visit  Medication Sig Dispense Refill  . ARIPiprazole (ABILIFY) 5 MG tablet Take 1 tablet (5 mg total) by mouth daily. 30 tablet 3  . ferrous sulfate 325 (65 FE) MG tablet Take 1 tablet (325 mg total) by mouth 2 (two) times daily with a meal. (Patient taking differently: Take 325 mg by mouth at bedtime. ) 60 tablet 1  . losartan (COZAAR) 50 MG tablet Take 50 mg by mouth daily.     . Multiple Vitamins-Minerals (CENTRUM SILVER ADULT 50+) TABS Take 1 capsule by mouth 2 (two) times daily. 30 tablet 2   No current facility-administered medications on file prior to visit.    No Known Allergies Social History    Socioeconomic History  . Marital status: Married    Spouse name: Not on file  . Number of children: Not on file  . Years of education: Not on file  . Highest education level: Not on file  Occupational History  . Not on file  Social Needs  . Financial resource strain: Not on file  . Food insecurity:    Worry: Not on file    Inability: Not on file  . Transportation needs:    Medical: Not on file    Non-medical: Not on file  Tobacco Use  . Smoking status: Former Smoker    Last attempt to  quit: 11/21/1985    Years since quitting: 32.5  . Smokeless tobacco: Never Used  Substance and Sexual Activity  . Alcohol use: No  . Drug use: No  . Sexual activity: Yes  Lifestyle  . Physical activity:    Days per week: Not on file    Minutes per session: Not on file  . Stress: Not on file  Relationships  . Social connections:    Talks on phone: Not on file    Gets together: Not on file    Attends religious service: Not on file    Active member of club or organization: Not on file    Attends meetings of clubs or organizations: Not on file    Relationship status: Not on file  . Intimate partner violence:    Fear of current or ex partner: Not on file    Emotionally abused: Not on file    Physically abused: Not on file    Forced sexual activity: Not on file  Other Topics Concern  . Not on file  Social History Narrative  . Not on file   Family History  Problem Relation Age of Onset  . Multiple sclerosis Son   . Bipolar disorder Son       Review of Systems  All other systems reviewed and are negative.      Objective:   Physical Exam  Constitutional: He is oriented to person, place, and time. He appears well-developed and well-nourished. No distress.  HENT:  Right Ear: External ear normal.  Left Ear: External ear normal.  Nose: Nose normal.  Mouth/Throat: Oropharynx is clear and moist. No oropharyngeal exudate.  Eyes: Pupils are equal, round, and reactive to light.  Conjunctivae and EOM are normal. No scleral icterus.  Neck: Normal range of motion. Neck supple. No JVD present. No thyromegaly present.  Cardiovascular: Normal rate, regular rhythm and normal heart sounds. Exam reveals no gallop and no friction rub.  No murmur heard. Pulmonary/Chest: Effort normal and breath sounds normal. No stridor. No respiratory distress. He has no wheezes. He has no rales.  Abdominal: Soft. Bowel sounds are normal. He exhibits no distension and no mass. There is no tenderness. There is no guarding.  Musculoskeletal: Normal range of motion. He exhibits no edema.  Lymphadenopathy:    He has no cervical adenopathy.  Neurological: He is alert and oriented to person, place, and time. He displays normal reflexes. No cranial nerve deficit or sensory deficit. He exhibits normal muscle tone. Coordination normal.  Skin: No rash noted. He is not diaphoretic. No erythema.  Psychiatric: His behavior is normal. His mood appears anxious. His speech is tangential. Thought content is paranoid. Cognition and memory are normal. He expresses no homicidal and no suicidal ideation. He expresses no suicidal plans and no homicidal plans.  Vitals reviewed.         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  Therefore I want to try to work slowly and gain the patient stressed.  I have encouraged him to try to take the Abilify every day to reach a more therapeutic level in his bloodstream to try to minimize his delusions and paranoia as well as his hallucinations.  He denies any hallucinations today and he becomes defensive when I speak of paranoid thoughts and delusions.  In the future, I believe the patient would benefit  from meeting with a psychiatrist however I will defer that to her next visit when hopefully I have  gained more of the patient stressed.  In the meantime I want him to increase the Abilify to 5 mg a day.  He will work on diet and try to eat more fruits and vegetables and less pork and red meat and fried foods to address his cholesterol.  Meanwhile I will consult hand surgery regarding his chronic carpal tunnel syndrome which is gone for years and is recently gotten much worse with weakness and numbness.

## 2018-05-30 DIAGNOSIS — R200 Anesthesia of skin: Secondary | ICD-10-CM | POA: Diagnosis not present

## 2018-06-12 ENCOUNTER — Ambulatory Visit (INDEPENDENT_AMBULATORY_CARE_PROVIDER_SITE_OTHER): Payer: Medicare Other | Admitting: Family Medicine

## 2018-06-12 ENCOUNTER — Encounter: Payer: Self-pay | Admitting: Neurology

## 2018-06-12 ENCOUNTER — Encounter: Payer: Self-pay | Admitting: Family Medicine

## 2018-06-12 ENCOUNTER — Other Ambulatory Visit: Payer: Self-pay | Admitting: *Deleted

## 2018-06-12 VITALS — BP 130/58 | HR 62 | Temp 97.8°F | Resp 16 | Ht 67.0 in | Wt 148.0 lb

## 2018-06-12 DIAGNOSIS — R200 Anesthesia of skin: Secondary | ICD-10-CM

## 2018-06-12 DIAGNOSIS — T148XXA Other injury of unspecified body region, initial encounter: Secondary | ICD-10-CM

## 2018-06-12 DIAGNOSIS — F251 Schizoaffective disorder, depressive type: Secondary | ICD-10-CM | POA: Diagnosis not present

## 2018-06-12 MED ORDER — SILDENAFIL CITRATE 100 MG PO TABS: 100.0000 mg | ORAL_TABLET | Freq: Every day | ORAL | 5 refills | Status: DC | PRN

## 2018-06-12 NOTE — Progress Notes (Signed)
Subjective:    Patient ID: Devin Valdez, male    DOB: 1941-09-30, 77 y.o.   MRN: [id number]  HPI  04/30/18 Patient is here today unaccompanied to establish care.  Patient states that he wanted to change doctors because he believes his previous doctors were conspiring against him.  Obviously this statement is very concerning causing me to probe deeper into his past medical history.  Patient states that approximately 1 year ago, he had a breakdown.  He states that he was in a restaurant and two men  told him that he was about to eat his last meal.  No one else saw these men.  He went to the restroom and these men follow him to the restroom and then repeated the statement to him.  This terrified the patient.  He was unable to tell his wife because he felt that she would not believe him.  He was unable to sleep at night because of the fear of dying.  He states that he would have dreams that would reiterate that he was about to die.  However he was uncertain if they were dreams or if they were hallucinations.  He also reports fears and concerns that sound like delusions.  These delusions would cause him not to eat.  He lost down to 125 pounds.  These fears and delusions made him extremely depressed.  He had no energy.  He would want to leave the home.  He could not sleep.  He denies any suicidal ideation.  His history is very difficult to follow.  He has very tangential thoughts.  It is difficult to follow his rationale and his train of thought.  I frequently have to redirect him to gain more information and more insight into what he is referring to.  I see that approximately 1 year ago, he was in the emergency room in the hospital for vertigo and gait instability.  MRI showed chronic microvascular changes but no explanation.  There are several mentions of hallucination and underlying psychiatric paranoia and the patient was referred to a psychiatrist however he stopped going to her.  Past medical history  is also significant for hypertension although he states that his blood pressure is high here because he is nervous and anxious in the office.  He also has a history of iron deficiency anemia.  He states that he is overdue for a colonoscopy.  He is overdue for prostate exam.  He is uncertain of his immunization status.  At that time, my plan was:  As I explained to the patient, he has several concerns including scheduling a colonoscopy, physical, carpal tunnel, vertigo.  However I believe the biggest issue for the patient presently is his underlying paranoia, delusions, and hallucinations.  I feel that if we do not treat this effectively, I will be unable to treat his other medical problems.  Therefore I would like to start the patient on Abilify 5 mg a day and reassess the patient and 3 weeks for improvement.  At that follow-up, we will address his blood pressure hopefully if the patient is more acceptable to treatment.  I will also obtain a CBC, CMP, and a fasting lipid panel.  05/21/18 Patient is here today for follow-up.  He states that he is taking Abilify 5 mg every other day.  He is not able to tolerate it every day because he states that the medication keeps him awake.  Otherwise he denies any side effects from the medication.  I was able to gain further insight into his past history.  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  Apparently at that time, his sister died suddenly.  He was not made aware that his sister had died until a week after she had been cremated.  Neither he nor his mother could visit her prior to her death.  Patient interprets this that the family was keeping him from her as well as his mother from his sister.  His mother died less than a month later.  This caused the patient to spiral into a deep depression.  He believes this is what triggered some of his hallucinations and underlying issues.  However he denies any hallucinations today.  He continues to demonstrate  very tangential speech that is difficult to follow.  He frequently talks and hyperreligious times about the "enemy" attacking him and challenging him.  When I asked him to clarify, he believes the enemy is the devil.  When I asked him how the enemy attacks him, he cannot provide specific instances but speaks in vague generalities about being persecuted by the enemy.  I did review his lab work which was significant for hyperlipidemia however the patient refuses any medication for hyperlipidemia.  His biggest concern today is bilateral hand pain that he attributes to carpal tunnel syndrome.  He reports burning stinging paresthesias and pain radiating from both wrist into the first second third and fourth fingers of both hands.  He also reports decreasing grip strength and weakness in his hands as well as numbness in his hands.  He has a positive Tinel sign bilaterally as well as positive Phalen sign bilaterally.  There is no wasting of the thenar eminence however he does have moderately decreased grip strength.  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  Therefore I want to try to work slowly and gain the patient stressed.  I have encouraged him to try to take the Abilify every day to reach a more therapeutic level in his bloodstream to try to minimize his delusions and paranoia as well as his hallucinations.  He denies any hallucinations today and he becomes defensive when I speak of paranoid thoughts and delusions.  In the future, I believe the patient would benefit from meeting with a psychiatrist however I will defer that to her next visit when hopefully I have gained more of the patient stressed.  In the meantime I want him to increase the Abilify to 5 mg a day.  He will work on diet and try to eat more fruits and vegetables and less pork and red meat and fried foods to address his cholesterol.  Meanwhile I will consult hand surgery regarding his chronic  carpal tunnel syndrome which is gone for years and is recently gotten much worse with weakness and numbness.  06/12/18 Overall, the patient seems to be doing much better.  He states that he is resting better at night.  He is sleeping approximately 7 hours.  He also states that his depression has improved.  His overall outlook is improving.  He feels more optimistic.  His speech is much easier to follow and more coherent and logical.  He is met with a hand surgeon and they are arranging nerve conduction test.  He denies any side effects on the Abilify.  He also denies having paranoid thoughts or delusions.  He again is very defensive about the hallucinations he experienced in  the past.   Past Medical History:  Diagnosis Date  . Anemia   . Anxiety   . Depression   . Falls   . Hallucinations   . Hyperlipidemia   . Hypertension    Past Surgical History:  Procedure Laterality Date  . EYE SURGERY    . HERNIA REPAIR     Current Outpatient Medications on File Prior to Visit  Medication Sig Dispense Refill  . ARIPiprazole (ABILIFY) 5 MG tablet Take 1 tablet (5 mg total) by mouth daily. 30 tablet 3  . ferrous sulfate 325 (65 FE) MG tablet Take 1 tablet (325 mg total) by mouth 2 (two) times daily with a meal. (Patient taking differently: Take 325 mg by mouth at bedtime. ) 60 tablet 1  . losartan (COZAAR) 50 MG tablet Take 50 mg by mouth daily.     . Multiple Vitamins-Minerals (CENTRUM SILVER ADULT 50+) TABS Take 1 capsule by mouth 2 (two) times daily. 30 tablet 2   No current facility-administered medications on file prior to visit.    No Known Allergies Social History   Socioeconomic History  . Marital status: Married    Spouse name: Not on file  . Number of children: Not on file  . Years of education: Not on file  . Highest education level: Not on file  Occupational History  . Not on file  Social Needs  . Financial resource strain: Not on file  . Food insecurity:    Worry: Not on file     Inability: Not on file  . Transportation needs:    Medical: Not on file    Non-medical: Not on file  Tobacco Use  . Smoking status: Former Smoker    Last attempt to quit: 11/21/1985    Years since quitting: 32.5  . Smokeless tobacco: Never Used  Substance and Sexual Activity  . Alcohol use: No  . Drug use: No  . Sexual activity: Yes  Lifestyle  . Physical activity:    Days per week: Not on file    Minutes per session: Not on file  . Stress: Not on file  Relationships  . Social connections:    Talks on phone: Not on file    Gets together: Not on file    Attends religious service: Not on file    Active member of club or organization: Not on file    Attends meetings of clubs or organizations: Not on file    Relationship status: Not on file  . Intimate partner violence:    Fear of current or ex partner: Not on file    Emotionally abused: Not on file    Physically abused: Not on file    Forced sexual activity: Not on file  Other Topics Concern  . Not on file  Social History Narrative  . Not on file   Family History  Problem Relation Age of Onset  . Multiple sclerosis Son   . Bipolar disorder Son       Review of Systems  All other systems reviewed and are negative.      Objective:   Physical Exam  Constitutional: He is oriented to person, place, and time. He appears well-developed and well-nourished. No distress.  HENT:  Right Ear: External ear normal.  Left Ear: External ear normal.  Nose: Nose normal.  Mouth/Throat: Oropharynx is clear and moist. No oropharyngeal exudate.  Eyes: Pupils are equal, round, and reactive to light. Conjunctivae and EOM are normal. No scleral icterus.  Neck: Normal range of motion. Neck supple. No JVD present. No thyromegaly present.  Cardiovascular: Normal rate, regular rhythm and normal heart sounds. Exam reveals no gallop and no friction rub.  No murmur heard. Pulmonary/Chest: Effort normal and breath sounds normal. No stridor.  No respiratory distress. He has no wheezes. He has no rales.  Abdominal: Soft. Bowel sounds are normal. He exhibits no distension and no mass. There is no tenderness. There is no guarding.  Musculoskeletal: Normal range of motion. He exhibits no edema.  Lymphadenopathy:    He has no cervical adenopathy.  Neurological: He is alert and oriented to person, place, and time. He displays normal reflexes. No cranial nerve deficit or sensory deficit. He exhibits normal muscle tone. Coordination normal.  Skin: No rash noted. He is not diaphoretic. No erythema.  Psychiatric: He has a normal mood and affect. His speech is normal and behavior is normal. Thought content is not paranoid and not delusional. Cognition and memory are normal. He expresses no homicidal and no suicidal ideation. He expresses no suicidal plans and no homicidal plans.  Vitals reviewed.         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  He is sleeping better.  He reports feeling less depressed and more optimistic.  His paranoia seems to have improved.  Overall I believe the Abilify is working well.  Therefore I will continue the Abilify 5 mg a day and reassess the patient in 3 to 6 months.  We discussed his cholesterol.  His ten-year risk of cardiovascular disease is almost 15%.  I recommended a statin but he would like to try diet and exercise first.  Reassess the patient in 6 months or as needed

## 2018-06-21 ENCOUNTER — Ambulatory Visit (INDEPENDENT_AMBULATORY_CARE_PROVIDER_SITE_OTHER): Payer: Medicare Other | Admitting: Neurology

## 2018-06-21 DIAGNOSIS — G5620 Lesion of ulnar nerve, unspecified upper limb: Secondary | ICD-10-CM

## 2018-06-21 DIAGNOSIS — T148XXA Other injury of unspecified body region, initial encounter: Secondary | ICD-10-CM

## 2018-06-21 DIAGNOSIS — M5412 Radiculopathy, cervical region: Secondary | ICD-10-CM

## 2018-06-21 NOTE — Procedures (Signed)
[facility] Neurology  [place], [place]  [place], [place] [address] Tel: [phone] Fax:  [phone] Test Date:  06/21/2018  Patient: Devin Valdez DOB: 1941/05/30 Physician: Narda Amber, DO  Sex: Male Height: 5\' 7"  Ref Phys: Daryll Brod, MD  ID#: [id number] Temp: 34.2C Technician:    Patient Complaints: This is a 77 year-old man referred for evaluation of abnormal and tingling and numbness, worse on the left.  NCV & EMG Findings: Extensive electrodiagnostic testing of the left upper extremity and additional studies of the right shows:  1. Bilateral median, ulnar, dorsal ulnar cutaneous, and makes palmer sensory responses are within normal limits. 2. Bilateral median motor responses are within normal limits. Bilateral ulnar motor responses show severe in the slowed conduction velocity across the elbow (A Elbow-B Elbow, L33, R40 m/s).   3. Chronic motor axon loss changes are seen affecting the left ulnar innervated muscles and bilateral C5-C6 myotomes. There is no evidence of accompanied active denervation.   Impression: 1. Bilateral ulnar neuropathy with slowing across the elbow, purely demyelinating in type; these findings are moderate in and worse on the left. 2. 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   ___________________________ Narda Amber, DO    Nerve Conduction Studies Anti Sensory Summary Table   Site NR Peak (ms) Norm Peak (ms) P-T Amp (V) Norm P-T Amp  Left DorsCutan Anti Sensory (Dorsum 5th MC)  34.2C  Wrist    1.8 <3.2 8.8 >5  Right DorsCutan Anti Sensory (Dorsum 5th MC)  34.2C  Wrist    2.0 <3.2 9.0 >5  Left Median Anti Sensory (2nd Digit)  34.2C  Wrist    3.2 <3.8 16.5 >10  Right Median Anti Sensory (2nd Digit)  34.2C  Wrist    3.2 <3.8 21.6 >10  Left Ulnar Anti Sensory (5th Digit)  34.2C  Wrist    3.2 <3.2 9.1 >5  Right Ulnar Anti Sensory (5th Digit)  34.2C  Wrist    3.1  <3.2 11.3 >5   Motor Summary Table   Site NR Onset (ms) Norm Onset (ms) O-P Amp (mV) Norm O-P Amp Site1 Site2 Delta-0 (ms) Dist (cm) Vel (m/s) Norm Vel (m/s)  Left Median Motor (Abd Poll Brev)  34.2C  Wrist    3.3 <4.0 9.7 >5 Elbow Wrist 5.8 30.0 52 >50  Elbow    9.1  9.1         Right Median Motor (Abd Poll Brev)  34.2C  Wrist    3.0 <4.0 10.8 >5 Elbow Wrist 5.8 32.0 55 >50  Elbow    8.8  10.6         Left Ulnar Motor (Abd Dig Minimi)  34.2C  Wrist    3.0 <3.1 8.0 >7 B Elbow Wrist 4.3 26.0 60 >50  B Elbow    7.3  6.4  A Elbow B Elbow 3.0 10.0 33 >50  A Elbow    10.3  6.2         Right Ulnar Motor (Abd Dig Minimi)  34.2C  Wrist    2.5 <3.1 8.2 >7 B Elbow Wrist 4.8 27.0 56 >50  B Elbow    7.3  6.2  A Elbow B Elbow 2.5 10.0 40 >50  A Elbow    9.8  5.9          Comparison Summary Table   Site NR Peak (ms) Norm Peak (ms) P-T Amp (V) Site1 Site2 Delta-P (ms) Norm  Delta (ms)  Left Median/Ulnar Palm Comparison (Wrist - 8cm)  34.2C  Median Palm    1.7 <2.2 48.3 Median Palm Ulnar Palm 0.3   Ulnar Palm    2.0 <2.2 7.2      Right Median/Ulnar Palm Comparison (Wrist - 8cm)  34.2C  Median Palm    1.8 <2.2 19.5 Median Palm Ulnar Palm 0.2   Ulnar Palm    2.0 <2.2 2.8       EMG   Side Muscle Ins Act Fibs Psw Fasc Number Recrt Dur Dur. Amp Amp. Poly Poly. Comment  Right 1stDorInt Nml Nml Nml Nml Nml Nml Nml Nml Nml Nml Nml Nml N/A  Right ABD Dig Min Nml Nml Nml Nml Nml Nml Nml Nml Nml Nml Nml Nml N/A  Right FlexCarpiUln Nml Nml Nml Nml Nml Nml Nml Nml Nml Nml Nml Nml N/A  Right PronatorTeres Nml Nml Nml Nml 1- Rapid Some 1+ Some 1+ Nml Nml N/A  Right Biceps Nml Nml Nml Nml 1- Rapid Some 1+ Some 1+ Nml Nml N/A  Right Triceps Nml Nml Nml Nml Nml Nml Nml Nml Nml Nml Nml Nml N/A  Right Deltoid Nml Nml Nml Nml 1- Rapid Some 1+ Some 1+ Nml Nml N/A  Left 1stDorInt Nml Nml Nml Nml Nml Nml Nml Nml Nml Nml Nml Nml N/A  Left ABD Dig Min Nml Nml Nml Nml 1- Rapid Some 1+ Some 1+ Some 1+ N/A  Left  FlexCarpiUln Nml Nml Nml Nml 1- Rapid Some 1+ Some 1+ Some 1+ N/A  Left PronatorTeres Nml Nml Nml Nml 1- Rapid Some 1+ Some 1+ Some 1+ N/A  Left Biceps Nml Nml Nml Nml 1- Rapid Some 1+ Some 1+ Some 1+ N/A  Left Triceps Nml Nml Nml Nml Nml Nml Nml Nml Nml Nml Nml Nml N/A  Left Deltoid Nml Nml Nml Nml 1- Rapid Some 1+ Some 1+ Some 1+ N/A      Waveforms:

## 2018-06-22 NOTE — Progress Notes (Signed)
Nerve conduction study shows different types of nerve problems.  He has pinched nerves at his elbow called ulnar neuropathy that can cause numbness and tingling in his fourth and fifth fingers on both hands.  However he also appears to have a pinched nerve in his neck that could be contributing to the numbness and tingling in his hands.  Are they scheduling him for an MRI of his neck or has he heard from the neurologist or hand surgeon yet?

## 2018-06-22 NOTE — Progress Notes (Signed)
Please tell patient:  Nerve conduction study shows different types of nerve problems.  He has pinched nerves at his elbow called ulnar neuropathy that can cause numbness and tingling in his fourth and fifth fingers on both hands.  However he also appears to have a pinched nerve in his neck that could be contributing to the numbness and tingling in his hands.  Are they scheduling him for an MRI of his neck or has he heard from the neurologist or hand surgeon yet?

## 2018-06-25 DIAGNOSIS — G5622 Lesion of ulnar nerve, left upper limb: Secondary | ICD-10-CM | POA: Insufficient documentation

## 2018-06-25 DIAGNOSIS — G5621 Lesion of ulnar nerve, right upper limb: Secondary | ICD-10-CM | POA: Diagnosis not present

## 2018-06-25 DIAGNOSIS — M5412 Radiculopathy, cervical region: Secondary | ICD-10-CM | POA: Diagnosis not present

## 2018-06-27 NOTE — Progress Notes (Signed)
Called and spoke to pt and pt's wife - Dr. Fredna Dow give pt Prednisone for his hand to see if that helps and if not he may need to do surgery on that hand. He also is sending him to [facility] and he has an apt 08/01/18.

## 2018-07-11 DIAGNOSIS — R29898 Other symptoms and signs involving the musculoskeletal system: Secondary | ICD-10-CM | POA: Diagnosis not present

## 2018-07-11 DIAGNOSIS — R269 Unspecified abnormalities of gait and mobility: Secondary | ICD-10-CM | POA: Diagnosis not present

## 2018-07-11 DIAGNOSIS — M5412 Radiculopathy, cervical region: Secondary | ICD-10-CM | POA: Diagnosis not present

## 2018-07-18 DIAGNOSIS — R29898 Other symptoms and signs involving the musculoskeletal system: Secondary | ICD-10-CM | POA: Diagnosis not present

## 2018-07-18 DIAGNOSIS — R269 Unspecified abnormalities of gait and mobility: Secondary | ICD-10-CM | POA: Diagnosis not present

## 2018-07-18 DIAGNOSIS — M5412 Radiculopathy, cervical region: Secondary | ICD-10-CM | POA: Diagnosis not present

## 2018-07-25 DIAGNOSIS — M6281 Muscle weakness (generalized): Secondary | ICD-10-CM | POA: Diagnosis not present

## 2018-07-25 DIAGNOSIS — R2689 Other abnormalities of gait and mobility: Secondary | ICD-10-CM | POA: Diagnosis not present

## 2018-07-25 DIAGNOSIS — M5412 Radiculopathy, cervical region: Secondary | ICD-10-CM | POA: Diagnosis not present

## 2018-07-30 DIAGNOSIS — M4802 Spinal stenosis, cervical region: Secondary | ICD-10-CM | POA: Diagnosis not present

## 2018-07-30 DIAGNOSIS — Z833 Family history of diabetes mellitus: Secondary | ICD-10-CM | POA: Diagnosis not present

## 2018-07-30 DIAGNOSIS — I10 Essential (primary) hypertension: Secondary | ICD-10-CM | POA: Diagnosis not present

## 2018-07-30 DIAGNOSIS — D509 Iron deficiency anemia, unspecified: Secondary | ICD-10-CM | POA: Diagnosis not present

## 2018-07-30 DIAGNOSIS — Z823 Family history of stroke: Secondary | ICD-10-CM | POA: Diagnosis not present

## 2018-07-30 DIAGNOSIS — M5412 Radiculopathy, cervical region: Secondary | ICD-10-CM | POA: Diagnosis not present

## 2018-07-30 DIAGNOSIS — R0683 Snoring: Secondary | ICD-10-CM | POA: Diagnosis not present

## 2018-07-30 DIAGNOSIS — G992 Myelopathy in diseases classified elsewhere: Secondary | ICD-10-CM | POA: Diagnosis not present

## 2018-07-30 DIAGNOSIS — Z79899 Other long term (current) drug therapy: Secondary | ICD-10-CM | POA: Diagnosis not present

## 2018-07-30 DIAGNOSIS — Z01818 Encounter for other preprocedural examination: Secondary | ICD-10-CM | POA: Diagnosis not present

## 2018-08-09 DIAGNOSIS — G9529 Other cord compression: Secondary | ICD-10-CM | POA: Diagnosis not present

## 2018-08-09 DIAGNOSIS — Z833 Family history of diabetes mellitus: Secondary | ICD-10-CM | POA: Diagnosis not present

## 2018-08-09 DIAGNOSIS — I10 Essential (primary) hypertension: Secondary | ICD-10-CM | POA: Diagnosis present

## 2018-08-09 DIAGNOSIS — M4802 Spinal stenosis, cervical region: Secondary | ICD-10-CM | POA: Diagnosis present

## 2018-08-09 DIAGNOSIS — M5412 Radiculopathy, cervical region: Secondary | ICD-10-CM | POA: Diagnosis not present

## 2018-08-09 DIAGNOSIS — G992 Myelopathy in diseases classified elsewhere: Secondary | ICD-10-CM | POA: Diagnosis present

## 2018-08-09 DIAGNOSIS — Z823 Family history of stroke: Secondary | ICD-10-CM | POA: Diagnosis not present

## 2018-08-09 DIAGNOSIS — D509 Iron deficiency anemia, unspecified: Secondary | ICD-10-CM | POA: Diagnosis present

## 2018-08-09 DIAGNOSIS — Z79899 Other long term (current) drug therapy: Secondary | ICD-10-CM | POA: Diagnosis not present

## 2018-08-15 DIAGNOSIS — G5621 Lesion of ulnar nerve, right upper limb: Secondary | ICD-10-CM | POA: Diagnosis not present

## 2018-08-15 DIAGNOSIS — M5412 Radiculopathy, cervical region: Secondary | ICD-10-CM | POA: Diagnosis not present

## 2018-08-15 DIAGNOSIS — D62 Acute posthemorrhagic anemia: Secondary | ICD-10-CM | POA: Diagnosis not present

## 2018-08-15 DIAGNOSIS — G5622 Lesion of ulnar nerve, left upper limb: Secondary | ICD-10-CM | POA: Diagnosis not present

## 2018-08-15 DIAGNOSIS — M4322 Fusion of spine, cervical region: Secondary | ICD-10-CM | POA: Diagnosis not present

## 2018-08-15 DIAGNOSIS — Z4789 Encounter for other orthopedic aftercare: Secondary | ICD-10-CM | POA: Diagnosis not present

## 2018-08-20 DIAGNOSIS — M5412 Radiculopathy, cervical region: Secondary | ICD-10-CM | POA: Diagnosis not present

## 2018-08-20 DIAGNOSIS — Z4789 Encounter for other orthopedic aftercare: Secondary | ICD-10-CM | POA: Diagnosis not present

## 2018-08-20 DIAGNOSIS — G5622 Lesion of ulnar nerve, left upper limb: Secondary | ICD-10-CM | POA: Diagnosis not present

## 2018-08-20 DIAGNOSIS — G5621 Lesion of ulnar nerve, right upper limb: Secondary | ICD-10-CM | POA: Diagnosis not present

## 2018-08-20 DIAGNOSIS — M4322 Fusion of spine, cervical region: Secondary | ICD-10-CM | POA: Diagnosis not present

## 2018-08-20 DIAGNOSIS — D62 Acute posthemorrhagic anemia: Secondary | ICD-10-CM | POA: Diagnosis not present

## 2018-08-23 DIAGNOSIS — G5622 Lesion of ulnar nerve, left upper limb: Secondary | ICD-10-CM | POA: Diagnosis not present

## 2018-08-23 DIAGNOSIS — M5412 Radiculopathy, cervical region: Secondary | ICD-10-CM | POA: Diagnosis not present

## 2018-08-23 DIAGNOSIS — M4322 Fusion of spine, cervical region: Secondary | ICD-10-CM | POA: Diagnosis not present

## 2018-08-23 DIAGNOSIS — D62 Acute posthemorrhagic anemia: Secondary | ICD-10-CM | POA: Diagnosis not present

## 2018-08-23 DIAGNOSIS — Z4789 Encounter for other orthopedic aftercare: Secondary | ICD-10-CM | POA: Diagnosis not present

## 2018-08-23 DIAGNOSIS — G5621 Lesion of ulnar nerve, right upper limb: Secondary | ICD-10-CM | POA: Diagnosis not present

## 2018-08-28 DIAGNOSIS — D62 Acute posthemorrhagic anemia: Secondary | ICD-10-CM | POA: Diagnosis not present

## 2018-08-28 DIAGNOSIS — M4322 Fusion of spine, cervical region: Secondary | ICD-10-CM | POA: Diagnosis not present

## 2018-08-28 DIAGNOSIS — G5621 Lesion of ulnar nerve, right upper limb: Secondary | ICD-10-CM | POA: Diagnosis not present

## 2018-08-28 DIAGNOSIS — G5622 Lesion of ulnar nerve, left upper limb: Secondary | ICD-10-CM | POA: Diagnosis not present

## 2018-08-28 DIAGNOSIS — M5412 Radiculopathy, cervical region: Secondary | ICD-10-CM | POA: Diagnosis not present

## 2018-08-28 DIAGNOSIS — Z4789 Encounter for other orthopedic aftercare: Secondary | ICD-10-CM | POA: Diagnosis not present

## 2018-08-29 DIAGNOSIS — G5621 Lesion of ulnar nerve, right upper limb: Secondary | ICD-10-CM | POA: Diagnosis not present

## 2018-08-29 DIAGNOSIS — D62 Acute posthemorrhagic anemia: Secondary | ICD-10-CM | POA: Diagnosis not present

## 2018-08-29 DIAGNOSIS — M4322 Fusion of spine, cervical region: Secondary | ICD-10-CM | POA: Diagnosis not present

## 2018-08-29 DIAGNOSIS — M5412 Radiculopathy, cervical region: Secondary | ICD-10-CM | POA: Diagnosis not present

## 2018-08-29 DIAGNOSIS — Z4789 Encounter for other orthopedic aftercare: Secondary | ICD-10-CM | POA: Diagnosis not present

## 2018-08-29 DIAGNOSIS — G5622 Lesion of ulnar nerve, left upper limb: Secondary | ICD-10-CM | POA: Diagnosis not present

## 2018-08-30 DIAGNOSIS — D62 Acute posthemorrhagic anemia: Secondary | ICD-10-CM | POA: Diagnosis not present

## 2018-08-30 DIAGNOSIS — G5621 Lesion of ulnar nerve, right upper limb: Secondary | ICD-10-CM | POA: Diagnosis not present

## 2018-08-30 DIAGNOSIS — M4322 Fusion of spine, cervical region: Secondary | ICD-10-CM | POA: Diagnosis not present

## 2018-08-30 DIAGNOSIS — Z4789 Encounter for other orthopedic aftercare: Secondary | ICD-10-CM | POA: Diagnosis not present

## 2018-08-30 DIAGNOSIS — M5412 Radiculopathy, cervical region: Secondary | ICD-10-CM | POA: Diagnosis not present

## 2018-08-30 DIAGNOSIS — G5622 Lesion of ulnar nerve, left upper limb: Secondary | ICD-10-CM | POA: Diagnosis not present

## 2018-09-03 DIAGNOSIS — G5621 Lesion of ulnar nerve, right upper limb: Secondary | ICD-10-CM | POA: Diagnosis not present

## 2018-09-03 DIAGNOSIS — M4322 Fusion of spine, cervical region: Secondary | ICD-10-CM | POA: Diagnosis not present

## 2018-09-03 DIAGNOSIS — M5412 Radiculopathy, cervical region: Secondary | ICD-10-CM | POA: Diagnosis not present

## 2018-09-03 DIAGNOSIS — D62 Acute posthemorrhagic anemia: Secondary | ICD-10-CM | POA: Diagnosis not present

## 2018-09-03 DIAGNOSIS — Z4789 Encounter for other orthopedic aftercare: Secondary | ICD-10-CM | POA: Diagnosis not present

## 2018-09-03 DIAGNOSIS — G5622 Lesion of ulnar nerve, left upper limb: Secondary | ICD-10-CM | POA: Diagnosis not present

## 2018-09-06 DIAGNOSIS — D62 Acute posthemorrhagic anemia: Secondary | ICD-10-CM | POA: Diagnosis not present

## 2018-09-06 DIAGNOSIS — Z4789 Encounter for other orthopedic aftercare: Secondary | ICD-10-CM | POA: Diagnosis not present

## 2018-09-06 DIAGNOSIS — G5622 Lesion of ulnar nerve, left upper limb: Secondary | ICD-10-CM | POA: Diagnosis not present

## 2018-09-06 DIAGNOSIS — G5621 Lesion of ulnar nerve, right upper limb: Secondary | ICD-10-CM | POA: Diagnosis not present

## 2018-09-06 DIAGNOSIS — M4322 Fusion of spine, cervical region: Secondary | ICD-10-CM | POA: Diagnosis not present

## 2018-09-06 DIAGNOSIS — M5412 Radiculopathy, cervical region: Secondary | ICD-10-CM | POA: Diagnosis not present

## 2018-09-10 DIAGNOSIS — M5412 Radiculopathy, cervical region: Secondary | ICD-10-CM | POA: Diagnosis not present

## 2018-09-10 DIAGNOSIS — M4322 Fusion of spine, cervical region: Secondary | ICD-10-CM | POA: Diagnosis not present

## 2018-09-10 DIAGNOSIS — Z4789 Encounter for other orthopedic aftercare: Secondary | ICD-10-CM | POA: Diagnosis not present

## 2018-09-10 DIAGNOSIS — G5622 Lesion of ulnar nerve, left upper limb: Secondary | ICD-10-CM | POA: Diagnosis not present

## 2018-09-10 DIAGNOSIS — G5621 Lesion of ulnar nerve, right upper limb: Secondary | ICD-10-CM | POA: Diagnosis not present

## 2018-09-10 DIAGNOSIS — D62 Acute posthemorrhagic anemia: Secondary | ICD-10-CM | POA: Diagnosis not present

## 2018-09-12 DIAGNOSIS — M65949 Unspecified synovitis and tenosynovitis, unspecified hand: Secondary | ICD-10-CM | POA: Insufficient documentation

## 2018-09-13 DIAGNOSIS — G5622 Lesion of ulnar nerve, left upper limb: Secondary | ICD-10-CM | POA: Diagnosis not present

## 2018-09-13 DIAGNOSIS — M4322 Fusion of spine, cervical region: Secondary | ICD-10-CM | POA: Diagnosis not present

## 2018-09-13 DIAGNOSIS — M5412 Radiculopathy, cervical region: Secondary | ICD-10-CM | POA: Diagnosis not present

## 2018-09-13 DIAGNOSIS — D62 Acute posthemorrhagic anemia: Secondary | ICD-10-CM | POA: Diagnosis not present

## 2018-09-13 DIAGNOSIS — Z4789 Encounter for other orthopedic aftercare: Secondary | ICD-10-CM | POA: Diagnosis not present

## 2018-09-13 DIAGNOSIS — G5621 Lesion of ulnar nerve, right upper limb: Secondary | ICD-10-CM | POA: Diagnosis not present

## 2018-09-18 DIAGNOSIS — G5622 Lesion of ulnar nerve, left upper limb: Secondary | ICD-10-CM | POA: Diagnosis not present

## 2018-09-18 DIAGNOSIS — G5621 Lesion of ulnar nerve, right upper limb: Secondary | ICD-10-CM | POA: Diagnosis not present

## 2018-09-18 DIAGNOSIS — M4322 Fusion of spine, cervical region: Secondary | ICD-10-CM | POA: Diagnosis not present

## 2018-09-18 DIAGNOSIS — M5412 Radiculopathy, cervical region: Secondary | ICD-10-CM | POA: Diagnosis not present

## 2018-09-18 DIAGNOSIS — D62 Acute posthemorrhagic anemia: Secondary | ICD-10-CM | POA: Diagnosis not present

## 2018-09-18 DIAGNOSIS — Z4789 Encounter for other orthopedic aftercare: Secondary | ICD-10-CM | POA: Diagnosis not present

## 2018-09-19 ENCOUNTER — Ambulatory Visit (HOSPITAL_COMMUNITY): Payer: Medicare Other | Attending: Orthopedic Surgery | Admitting: Occupational Therapy

## 2018-09-19 ENCOUNTER — Other Ambulatory Visit: Payer: Self-pay

## 2018-09-19 ENCOUNTER — Encounter (HOSPITAL_COMMUNITY): Payer: Self-pay | Admitting: Occupational Therapy

## 2018-09-19 DIAGNOSIS — M25632 Stiffness of left wrist, not elsewhere classified: Secondary | ICD-10-CM | POA: Diagnosis not present

## 2018-09-19 DIAGNOSIS — M25642 Stiffness of left hand, not elsewhere classified: Secondary | ICD-10-CM | POA: Diagnosis not present

## 2018-09-19 DIAGNOSIS — R29898 Other symptoms and signs involving the musculoskeletal system: Secondary | ICD-10-CM | POA: Diagnosis not present

## 2018-09-19 NOTE — Patient Instructions (Signed)
AROM: Wrist Extension   With right palm down, bend wrist up. Repeat ____ times per set. Do ____ sets per session. Do ____ sessions per day.  Copyright  VHI. All rights reserved.   AROM: Wrist Flexion   With right palm up, bend wrist up. Repeat ____ times per set. Do ____ sets per session. Do ____ sessions per day.  Copyright  VHI. All rights reserved.   AROM: Forearm Pronation / Supination   With right arm in handshake position, slowly rotate palm down until stretch is felt. Relax. Then rotate palm up until stretch is felt. Repeat ____ times per set. Do ____ sets per session. Do ____ sessions per day.  Copyright  VHI. All rights reserved.

## 2018-09-20 NOTE — Therapy (Signed)
[place], [place], [address] Phone: [phone]   Fax:  [phone]  Occupational Therapy Evaluation  Patient Details  Name: Devin Valdez MRN: [id number] Date of Birth: 1941-06-11 Referring Provider (OT): Linard Millers, MD    Encounter Date: 09/19/2018  OT End of Session - 09/19/18 1913    Visit Number  1    Number of Visits  8    Date for OT Re-Evaluation  10/17/18    Authorization Type  1) Medicare 2) [employer]     OT Start Time  1352    OT Stop Time  1430    OT Time Calculation (min)  38 min    Activity Tolerance  Patient tolerated treatment well    Behavior During Therapy  [facility] for tasks assessed/performed       Past Medical History:  Diagnosis Date  . Anemia   . Anxiety   . Depression   . Falls   . Hallucinations   . Hyperlipidemia   . Hypertension     Past Surgical History:  Procedure Laterality Date  . EYE SURGERY    . HERNIA REPAIR      There were no vitals filed for this visit.  Subjective Assessment - 09/19/18 1902    Subjective   S: I want to improve and will do the work.     Pertinent History  Pt is a 77 y/o male with cervical myelopathy that has been experiencing difficulty with independent ADL completion due to left hand contracture. Pt was referred by Linard Millers, MD for evaluation and aggressive OT treatment using all appropriate modalities in order to reduce swelling and improve ROM in hand.       Patient Stated Goals  To be able to use dominant LUE and return to highest level of function.     Currently in Pain?  No/denies    Multiple Pain Sites  No        [facility] OT Assessment - 09/19/18 1356      Assessment   Medical Diagnosis  Left hand contracture     Referring Provider (OT)  Linard Millers, MD     Onset Date/Surgical Date  04/21/18   Approximate date   Hand Dominance  Left    Next MD Visit  N/A    Prior Therapy  None      Precautions   Precautions  None      Balance Screen   Has the patient fallen in the past 6 months  No    Has the patient had a decrease in activity level because of a fear of falling?   No    Is the patient reluctant to leave their home because of a fear of falling?   No      Prior Function   Level of Independence  Independent    Vocation  Retired    Leisure  Reading and having dinner with his wife      ADL   ADL comments  Pt has had difficulty holding and manipulating objects during ADL and IADL tasks due to decreased grip and pinch strength as well as decreased wrist ROM in left dominant extremity.       Written Expression   Dominant Hand  Left      Vision - History   Baseline Vision  No visual deficits      Cognition   Overall Cognitive Status  Within Functional Limits  for tasks assessed      Coordination   9 Hole Peg Test  Left    Left 9 Hole Peg Test  Test next session      Edema   Edema  Left hand swelling noted in comparison to right hand       ROM / Strength   AROM / PROM / Strength  AROM;PROM;Strength      AROM   Overall AROM Comments  Assessed seated    AROM Assessment Site  Wrist    Right/Left Elbow  Left    Right/Left Forearm  --    Right/Left Wrist  Left    Left Wrist Extension  13 Degrees    Left Wrist Flexion  25 Degrees    Left Wrist Radial Deviation  23 Degrees    Left Wrist Ulnar Deviation  20 Degrees      PROM   Overall PROM Comments  Assessed seated     PROM Assessment Site  Wrist    Right/Left Wrist  Left    Left Wrist Extension  20 Degrees    Left Wrist Flexion  31 Degrees    Left Wrist Radial Deviation  27 Degrees    Left Wrist Ulnar Deviation  24 Degrees      Strength   Overall Strength Comments  Assessed seated     Strength Assessment Site  Hand;Wrist    Right/Left Wrist  Left    Left Wrist Flexion  3/5    Left Wrist Extension  3/5    Left Wrist Radial Deviation  3/5    Left Wrist Ulnar Deviation  3/5    Right/Left hand  Left    Left Hand  Gross Grasp  Impaired    Left Hand Grip (lbs)  37    Left Hand Lateral Pinch  7 lbs    Left Hand 3 Point Pinch  10 lbs      Hand Function   Left Hand Gross Grasp  --                      OT Education - 09/19/18 1913    Education Details  Wrist stretches HEP    Person(s) Educated  Patient    Methods  Explanation;Demonstration;Verbal cues;Handout;Tactile cues    Comprehension  Verbalized understanding;Returned demonstration;Verbal cues required;Tactile cues required       OT Short Term Goals - 09/19/18 1901      OT SHORT TERM GOAL #1   Title  Pt will be educated onand become independent with HEP for improved use of left hand as dominant during ADLs.    Time  4    Period  Weeks    Status  New    Target Date  10/17/18      OT SHORT TERM GOAL #2   Title  Patient will increase A/ROM of wrist to [facility] in order to be able to complete dressing tasks with more ease.    Time  4    Period  Weeks    Status  New      OT SHORT TERM GOAL #3   Title  Pt will improve left grip strength by at least 15# to improve ability to grasp and hold lightweight items in the left hand during grooming tasks.      Time  4    Period  Weeks    Status  New      OT SHORT TERM GOAL #4   Title  Pt will improve left pinch strength by 5# to improve ability to using writing utensils with his left hand.    Time  4    Period  Weeks    Status  New      OT SHORT TERM GOAL #5   Title  Pt's left hand edema will decrease in order to be able to use his left dominant hand to complete functional tasks more efficiently.     Time  4    Period  Weeks    Status  New               Plan - 09/19/18 1916    Clinical Impression Statement  A: Pt is a 77 y/o male with cervical myelopathy and a left hand contracture that makes ADL completion difficult with dominant LUE. Pt presents with swelling and decreased grip and pinch strength in left hand and decreased ROM in left wrist impacting functional use of  left dominant extremity. During initial evaluation today, pt was educated on and provided with wrist stretching HEP that he was able to demonstrate understanding of with significant cuing.    Occupational Profile and client history currently impacting functional performance  Pt is motivated to return to highest level of function.     Occupational performance deficits (Please refer to evaluation for details):  ADL's;IADL's;Rest and Sleep;Leisure;Social Participation    Rehab Potential  Good    Current Impairments/barriers affecting progress:  History of schizoaffective disorder that has caused distrust of doctors in the past.     OT Frequency  2x / week    OT Duration  4 weeks    OT Treatment/Interventions  Self-care/ADL training;Therapeutic exercise;Ultrasound;Manual Therapy;Splinting;Iontophoresis;Energy conservation;Therapeutic activities;Cryotherapy;Paraffin;DME and/or AE instruction;Compression bandaging;Electrical Stimulation;Moist Heat;Contrast Bath;Passive range of motion;Patient/family education    Plan  P: Pt will benefit from skilled OT services to increase hand and wrist strength, manage edema and increase wirst ROM to improve use of LUE as dominant during daily tasks. Treatment plan: Left wrist strengthening and increase in ROM. Grip and pinch strengthening and edema management of left hand. Fine motor coordination training, if appropriate. Aggressive treatment and use of modalities per doctor's referral. Next Session: Complete 9 hole peg test and add coordination goals, if applicable.     Clinical Decision Making  Several treatment options, min-mod task modification necessary    OT Home Exercise Plan  Wrist stretches     Consulted and Agree with Plan of Care  Patient       Patient will benefit from skilled therapeutic intervention in order to improve the following deficits and impairments:  Decreased activity tolerance, Decreased strength, Impaired flexibility, Impaired sensation,  Decreased range of motion, Increased edema, Impaired UE functional use  Visit Diagnosis: Stiffness of left hand, not elsewhere classified  Stiffness of left wrist, not elsewhere classified  Other symptoms and signs involving the musculoskeletal system    Problem List Patient Active Problem List   Diagnosis Date Noted  . Failure to thrive in adult 05/26/2017  . Failure to thrive (0-17) 05/25/2017  . Abdominal pulsatile mass 05/25/2017  . Anemia 05/25/2017  . Essential hypertension, benign 05/25/2017  . Gait instability 05/20/2017  . Frequent falls 05/20/2017  . Depression 05/20/2017    Toney Rakes, OT Student  09/20/2018, 9:11 AM  [place], [place], [address] Phone: [phone]   Fax:  [phone]  Name: Devin Valdez MRN: [id number] Date of Birth: 11-Nov-1941

## 2018-09-26 ENCOUNTER — Ambulatory Visit (HOSPITAL_COMMUNITY): Payer: Medicare Other | Attending: Orthopedic Surgery

## 2018-09-26 ENCOUNTER — Encounter (HOSPITAL_COMMUNITY): Payer: Self-pay

## 2018-09-26 DIAGNOSIS — R29898 Other symptoms and signs involving the musculoskeletal system: Secondary | ICD-10-CM | POA: Diagnosis not present

## 2018-09-26 DIAGNOSIS — M25642 Stiffness of left hand, not elsewhere classified: Secondary | ICD-10-CM | POA: Insufficient documentation

## 2018-09-26 DIAGNOSIS — M25632 Stiffness of left wrist, not elsewhere classified: Secondary | ICD-10-CM | POA: Diagnosis not present

## 2018-09-26 NOTE — Patient Instructions (Signed)
Home Exercises Program Theraputty Exercises  Do the following exercises 2 times a day using your affected hand.  1. Roll putty into a ball.  2. Make into a pancake. Stand up. Straighten your elbow. Focus on bending you wrist.   3. Roll putty into a roll.  4. Pinch along log with first finger and thumb.   5. Make into a ball.  6. Roll it back into a log.   7. Pinch using thumb and side of first finger.  8. Roll into a ball, then flatten into a pancake. Stand up. Straighten your elbow. Focus on bending you wrist.   9. Using your fingers, make putty into a mountain.

## 2018-09-26 NOTE — Therapy (Signed)
[place], [place], [address] Phone: [phone]   Fax:  [phone]  Occupational Therapy Treatment  Patient Details  Name: Devin Valdez MRN: [id number] Date of Birth: November 22, 1940 Referring Provider (OT): Linard Millers, MD    Encounter Date: 09/26/2018  OT End of Session - 09/26/18 1551    Visit Number  2    Number of Visits  8    Date for OT Re-Evaluation  10/17/18    Authorization Type  1) Medicare 2) [employer]     OT Start Time  1308    OT Stop Time  1345    OT Time Calculation (min)  37 min    Activity Tolerance  Patient tolerated treatment well    Behavior During Therapy  Devin Valdez for tasks assessed/performed       Past Medical History:  Diagnosis Date  . Anemia   . Anxiety   . Depression   . Falls   . Hallucinations   . Hyperlipidemia   . Hypertension     Past Surgical History:  Procedure Laterality Date  . EYE SURGERY    . HERNIA REPAIR      There were no vitals filed for this visit.  Subjective Assessment - 09/26/18 1318    Subjective   S: I'm concerned that I may be doing my exercises too much.     Currently in Pain?  No/denies         [facility] OT Assessment - 09/26/18 1320      Assessment   Medical Diagnosis  Left hand contracture       Precautions   Precautions  None               OT Treatments/Exercises (OP) - 09/26/18 1320      Exercises   Exercises  Wrist;Theraputty      Wrist Exercises   Other wrist exercises  Red theraputty -PVC pipe utilized to press circles into putty. Focused on wrist flexion and extension as well as grip strength.       Theraputty   Theraputty - Flatten  red - standing    Theraputty - Roll  red    Theraputty - Grip  red    Theraputty - Pinch  red - 3 point and lateral             OT Education - 09/26/18 1342    Education Details  Reviewed therapy goals. Discussed plan of care and progression of therapy. Pt  provided with red theraputty and grip and pinch strengthening HEP.     Person(s) Educated  Patient    Methods  Explanation;Demonstration;Verbal cues;Handout    Comprehension  Returned demonstration;Verbalized understanding       OT Short Term Goals - 09/26/18 1330      OT SHORT TERM GOAL #1   Title  Pt will be educated onand become independent with HEP for improved use of left hand as dominant during ADLs.    Time  4    Period  Weeks    Status  On-going      OT SHORT TERM GOAL #2   Title  Patient will increase A/ROM of wrist to [facility] in order to be able to complete dressing tasks with more ease.    Time  4    Period  Weeks    Status  On-going      OT SHORT TERM GOAL #3   Title  Pt will  improve left grip strength by at least 15# to improve ability to grasp and hold lightweight items in the left hand during grooming tasks.      Time  4    Period  Weeks    Status  On-going      OT SHORT TERM GOAL #4   Title  Pt will improve left pinch strength by 5# to improve ability to using writing utensils with his left hand.    Time  4    Period  Weeks    Status  On-going      OT SHORT TERM GOAL #5   Title  Pt's left hand edema will decrease in order to be able to use his left dominant hand to complete functional tasks more efficiently.     Time  4    Period  Weeks    Status  On-going               Plan - 09/26/18 1552    Clinical Impression Statement  A: Reviewed therapy goals and plan of care. Initiated hand strengthening tasks using theraputty. Updated HEP to include hand strengthening with putty. Continuous education provided during session regarding deficits, treamtment plan, form and technique. Pt had increased difficulty completing a correct lateral pinch using a closed fist. He atttempted to complete pinch with all digits extended versus flexed. Was provided with verbal, physical and visual cues/demonstration. He was able to complete with right hand correctly. max difficulty  completing with left hand.     Plan  P: Complete hand gripper task to work on hand strengthening. Wall stretch for wrist extension. Complete 9 hole peg test to assess coordination.     Consulted and Agree with Plan of Care  Patient       Patient will benefit from skilled therapeutic intervention in order to improve the following deficits and impairments:  Decreased activity tolerance, Decreased strength, Impaired flexibility, Impaired sensation, Decreased range of motion, Increased edema, Impaired UE functional use  Visit Diagnosis: Stiffness of left hand, not elsewhere classified  Stiffness of left wrist, not elsewhere classified  Other symptoms and signs involving the musculoskeletal system    Problem List Patient Active Problem List   Diagnosis Date Noted  . Failure to thrive in adult 05/26/2017  . Failure to thrive (0-17) 05/25/2017  . Abdominal pulsatile mass 05/25/2017  . Anemia 05/25/2017  . Essential hypertension, benign 05/25/2017  . Gait instability 05/20/2017  . Frequent falls 05/20/2017  . Depression 05/20/2017    Ailene Ravel, OTR/L,CBIS  [phone]  09/26/2018, 3:55 PM  [place] [address] [place], [place], [address] Phone: [phone]   Fax:  [phone]  Name: Devin Valdez MRN: [id number] Date of Birth: 03-19-1941

## 2018-09-28 ENCOUNTER — Ambulatory Visit (HOSPITAL_COMMUNITY): Payer: Medicare Other

## 2018-09-28 ENCOUNTER — Encounter (HOSPITAL_COMMUNITY): Payer: Self-pay

## 2018-09-28 DIAGNOSIS — R29898 Other symptoms and signs involving the musculoskeletal system: Secondary | ICD-10-CM

## 2018-09-28 DIAGNOSIS — M25642 Stiffness of left hand, not elsewhere classified: Secondary | ICD-10-CM | POA: Diagnosis not present

## 2018-09-28 DIAGNOSIS — M25632 Stiffness of left wrist, not elsewhere classified: Secondary | ICD-10-CM | POA: Diagnosis not present

## 2018-09-28 NOTE — Patient Instructions (Signed)
  Coordination Activities  Perform the following activities for 15-20  minutes 1-2 times per day with left hand(s).   Flip cards 1 at a time as fast as you can.  Deal cards with your thumb (Hold deck in hand and push card off top with thumb).  Rotate card in hand (clockwise and counter-clockwise).  Shuffle cards.  Pick up coins, buttons, marbles, dried beans/pasta of different sizes and place in container.  Pick up coins and place in container or coin bank.  Pick up coins and stack.  Pick up coins one at a time until you get 5-10 in your hand, then move coins from palm to fingertips to stack one at a time.  Twirl pen between fingers.  Practice writing and/or typing.  Screw together nuts and bolts, then unfasten.

## 2018-09-28 NOTE — Therapy (Signed)
[place], [place], [address] Phone: [phone]   Fax:  [phone]  Occupational Therapy Treatment  Patient Details  Name: Devin Valdez MRN: [id number] Date of Birth: Nov 25, 1940 Referring Provider (OT): Linard Millers, MD    Encounter Date: 09/28/2018  OT End of Session - 09/28/18 1132    Visit Number  3    Number of Visits  8    Date for OT Re-Evaluation  10/17/18    Authorization Type  1) Medicare 2) [employer]     OT Start Time  1118    OT Stop Time  1202    OT Time Calculation (min)  44 min    Activity Tolerance  Patient tolerated treatment well    Behavior During Therapy  [facility] for tasks assessed/performed       Past Medical History:  Diagnosis Date  . Anemia   . Anxiety   . Depression   . Falls   . Hallucinations   . Hyperlipidemia   . Hypertension     Past Surgical History:  Procedure Laterality Date  . EYE SURGERY    . HERNIA REPAIR      There were no vitals filed for this visit.  Subjective Assessment - 09/28/18 1131    Subjective   S: Last night was a better night. I didn't wake up as many time.     Currently in Pain?  No/denies         [facility] OT Assessment - 09/28/18 1122      Assessment   Medical Diagnosis  Left hand contracture       Precautions   Precautions  None      Coordination   9 Hole Peg Test  Right;Left    Right 9 Hole Peg Test  27.6"    Left 9 Hole Peg Test  38.6"      Strength   Right/Left hand  Right    Right Hand Grip (lbs)  65               OT Treatments/Exercises (OP) - 09/28/18 1124      Exercises   Exercises  Wrist;Theraputty      Additional Wrist Exercises   Hand Gripper with Large Beads  all beads with gripper set at 35#   horizontal   Hand Gripper with Medium Beads  all beads with gripper set 35#   horizontal   Hand Gripper with Small Beads  all beads with gripper set at 22#   horizontal            OT  Education - 09/28/18 1204    Education Details  Pt provided with coordination activities for HEP as coordination was test and less on the left than the right. Discussed causes of cervical mylopathy.     Person(s) Educated  Patient    Methods  Explanation;Handout    Comprehension  Verbalized understanding       OT Short Term Goals - 09/26/18 1330      OT SHORT TERM GOAL #1   Title  Pt will be educated onand become independent with HEP for improved use of left hand as dominant during ADLs.    Time  4    Period  Weeks    Status  On-going      OT SHORT TERM GOAL #2   Title  Patient will increase A/ROM of wrist to [facility] in order to be able to complete dressing  tasks with more ease.    Time  4    Period  Weeks    Status  On-going      OT SHORT TERM GOAL #3   Title  Pt will improve left grip strength by at least 15# to improve ability to grasp and hold lightweight items in the left hand during grooming tasks.      Time  4    Period  Weeks    Status  On-going      OT SHORT TERM GOAL #4   Title  Pt will improve left pinch strength by 5# to improve ability to using writing utensils with his left hand.    Time  4    Period  Weeks    Status  On-going      OT SHORT TERM GOAL #5   Title  Pt's left hand edema will decrease in order to be able to use his left dominant hand to complete functional tasks more efficiently.     Time  4    Period  Weeks    Status  On-going               Plan - 09/28/18 1204    Clinical Impression Statement  A: Focused on grip strengthening this session with patient requiring increased due to attention to task and muscle fatigue. VC provided for form and technique during task.     Plan  P: Add tweezer task for coordination. Deck of cards.    Consulted and Agree with Plan of Care  Patient       Patient will benefit from skilled therapeutic intervention in order to improve the following deficits and impairments:  Decreased activity tolerance, Decreased  strength, Impaired flexibility, Impaired sensation, Decreased range of motion, Increased edema, Impaired UE functional use  Visit Diagnosis: Stiffness of left hand, not elsewhere classified  Stiffness of left wrist, not elsewhere classified  Other symptoms and signs involving the musculoskeletal system    Problem List Patient Active Problem List   Diagnosis Date Noted  . Failure to thrive in adult 05/26/2017  . Failure to thrive (0-17) 05/25/2017  . Abdominal pulsatile mass 05/25/2017  . Anemia 05/25/2017  . Essential hypertension, benign 05/25/2017  . Gait instability 05/20/2017  . Frequent falls 05/20/2017  . Depression 05/20/2017   Ailene Ravel, OTR/L,CBIS  [phone]  09/28/2018, 12:08 PM  [facility] [address] [place], [place], [address] Phone: [phone]   Fax:  [phone]  Name: ZARIUS FURR MRN: [id number] Date of Birth: 08/03/1941

## 2018-10-01 ENCOUNTER — Ambulatory Visit (HOSPITAL_COMMUNITY): Payer: Medicare Other

## 2018-10-01 ENCOUNTER — Telehealth (HOSPITAL_COMMUNITY): Payer: Self-pay

## 2018-10-01 ENCOUNTER — Encounter (HOSPITAL_COMMUNITY): Payer: Self-pay

## 2018-10-01 DIAGNOSIS — M25642 Stiffness of left hand, not elsewhere classified: Secondary | ICD-10-CM | POA: Diagnosis not present

## 2018-10-01 DIAGNOSIS — M25632 Stiffness of left wrist, not elsewhere classified: Secondary | ICD-10-CM

## 2018-10-01 DIAGNOSIS — R29898 Other symptoms and signs involving the musculoskeletal system: Secondary | ICD-10-CM

## 2018-10-01 NOTE — Telephone Encounter (Signed)
Pt requested to come in earlier today  if someone doesn't show up for their apptment.

## 2018-10-02 NOTE — Therapy (Signed)
[place], [place], [address] Phone: [phone]   Fax:  [phone]  Occupational Therapy Treatment  Patient Details  Name: Devin Valdez MRN: [id number] Date of Birth: 05-25-1941 Referring Provider (OT): Linard Millers, MD    Encounter Date: 10/01/2018  OT End of Session - 10/01/18 1708    Visit Number  4    Number of Visits  8    Date for OT Re-Evaluation  10/17/18    Authorization Type  1) Medicare 2) [employer]     OT Start Time  1647    OT Stop Time  1727    OT Time Calculation (min)  40 min    Activity Tolerance  Patient tolerated treatment well    Behavior During Therapy  [facility] for tasks assessed/performed       Past Medical History:  Diagnosis Date  . Anemia   . Anxiety   . Depression   . Falls   . Hallucinations   . Hyperlipidemia   . Hypertension     Past Surgical History:  Procedure Laterality Date  . EYE SURGERY    . HERNIA REPAIR      There were no vitals filed for this visit.  Subjective Assessment - 10/01/18 1652    Subjective   S: I have working on this hand a lot since I saw you.     Currently in Pain?  No/denies         [facility] OT Assessment - 10/01/18 1652      Assessment   Medical Diagnosis  Left hand contracture       Precautions   Precautions  None               OT Treatments/Exercises (OP) - 10/01/18 1652      Exercises   Exercises  Wrist;Theraputty;Hand      Additional Wrist Exercises   Sponges  18, 22      Hand Exercises   Other Hand Exercises  Pt utilized green resistive clothespin to pick up 25 sponges using a 3 point pinch. increased time to complete.       Fine Motor Coordination (Hand/Wrist)   Fine Motor Coordination  Flipping cards;Dealing card with thumb;Small Pegboard    Small Pegboard  Patient utilized smalled patterned pegboard while using tweezers in left hand to increase fine motor coordination.     Flipping cards   patient was able to flip card one at a time with minimal difficulty.     Dealing card with thumb  Patient was able to deal cards with his thumb with moderate difficulty.                OT Short Term Goals - 10/01/18 1700      OT SHORT TERM GOAL #1   Title  Pt will be educated onand become independent with HEP for improved use of left hand as dominant during ADLs.    Time  4    Period  Weeks    Status  On-going      OT SHORT TERM GOAL #2   Title  Patient will increase A/ROM of wrist to [facility] in order to be able to complete dressing tasks with more ease.    Time  4    Period  Weeks    Status  On-going      OT SHORT TERM GOAL #3   Title  Pt will improve left grip strength by at  least 15# to improve ability to grasp and hold lightweight items in the left hand during grooming tasks.      Time  4    Period  Weeks    Status  On-going      OT SHORT TERM GOAL #4   Title  Pt will improve left pinch strength by 5# to improve ability to using writing utensils with his left hand.    Time  4    Period  Weeks    Status  On-going      OT SHORT TERM GOAL #5   Title  Pt's left hand edema will decrease in order to be able to use his left dominant hand to complete functional tasks more efficiently.     Time  4    Period  Weeks    Status  On-going      Additional Short Term Goals   Additional Short Term Goals  Yes      OT SHORT TERM GOAL #6   Title  patient will increase his fine motor coordination in his left hand by completing the 9 hole peg test in 28 seconds or less in order to be able to complete motor tasks with more precision and speed.     Time  2    Period  Weeks    Status  New    Target Date  10/17/18               Plan - 10/02/18 0800    Clinical Impression Statement  A: Continued to focus on coordination and pinch strength. Patient required verbal and visual cueing for correct form and technique. Rest breaks taken when needed due to muscle fatigue.     Plan  P:  Continue to work on grip and pinch strengthening. Work on in Ecologist.     Consulted and Agree with Plan of Care  Patient       Patient will benefit from skilled therapeutic intervention in order to improve the following deficits and impairments:  Decreased activity tolerance, Decreased strength, Impaired flexibility, Impaired sensation, Decreased range of motion, Increased edema, Impaired UE functional use  Visit Diagnosis: Stiffness of left hand, not elsewhere classified  Stiffness of left wrist, not elsewhere classified  Other symptoms and signs involving the musculoskeletal system    Problem List Patient Active Problem List   Diagnosis Date Noted  . Failure to thrive in adult 05/26/2017  . Failure to thrive (0-17) 05/25/2017  . Abdominal pulsatile mass 05/25/2017  . Anemia 05/25/2017  . Essential hypertension, benign 05/25/2017  . Gait instability 05/20/2017  . Frequent falls 05/20/2017  . Depression 05/20/2017    Ailene Ravel, OTR/L,CBIS  [phone]  10/02/2018, 8:05 AM  [facility] [address] [place], [place], [address] Phone: [phone]   Fax:  [phone]  Name: Devin Valdez MRN: [id number] Date of Birth: [birth year]

## 2018-10-03 ENCOUNTER — Encounter (HOSPITAL_COMMUNITY): Payer: Self-pay | Admitting: Occupational Therapy

## 2018-10-03 ENCOUNTER — Ambulatory Visit (HOSPITAL_COMMUNITY): Payer: Medicare Other | Admitting: Occupational Therapy

## 2018-10-03 DIAGNOSIS — M25632 Stiffness of left wrist, not elsewhere classified: Secondary | ICD-10-CM | POA: Diagnosis not present

## 2018-10-03 DIAGNOSIS — M25642 Stiffness of left hand, not elsewhere classified: Secondary | ICD-10-CM

## 2018-10-03 DIAGNOSIS — R29898 Other symptoms and signs involving the musculoskeletal system: Secondary | ICD-10-CM | POA: Diagnosis not present

## 2018-10-03 NOTE — Therapy (Signed)
[place], [place], [address] Phone: [phone]   Fax:  [phone]  Occupational Therapy Treatment  Patient Details  Name: Devin Valdez MRN: [id number] Date of Birth: 1941/06/21 Referring Provider (OT): Linard Millers, MD    Encounter Date: 10/03/2018  OT End of Session - 10/03/18 1427    Visit Number  5    Number of Visits  8    Date for OT Re-Evaluation  10/17/18    Authorization Type  1) Medicare 2) [employer]     OT Start Time  4128    OT Stop Time  1428    OT Time Calculation (min)  40 min    Activity Tolerance  Patient tolerated treatment well    Behavior During Therapy  [facility] for tasks assessed/performed       Past Medical History:  Diagnosis Date  . Anemia   . Anxiety   . Depression   . Falls   . Hallucinations   . Hyperlipidemia   . Hypertension     Past Surgical History:  Procedure Laterality Date  . EYE SURGERY    . HERNIA REPAIR      There were no vitals filed for this visit.  Subjective Assessment - 10/03/18 1351    Subjective   S: I've been moving it a lot and trying to get this swelling down.     Currently in Pain?  No/denies         [facility] OT Assessment - 10/03/18 1350      Assessment   Medical Diagnosis  Left hand contracture       Precautions   Precautions  None               OT Treatments/Exercises (OP) - 10/03/18 1351      Exercises   Exercises  Wrist;Theraputty;Hand      Additional Wrist Exercises   Sponges  17, 17    Hand Gripper with Large Beads  all beads with gripper set at 35#    Hand Gripper with Medium Beads  all beads with gripper set 35#    Hand Gripper with Small Beads  --      Manual Therapy   Manual Therapy  Edema management    Manual therapy comments  completed separately from therapeutic exercise    Edema Management  retrograde massage completed to left hand and digits to decrease edema, reduce pain, and improve ROM       Fine Motor Coordination (Hand/Wrist)   Fine Motor Coordination  Manipulating coins;Stacking coins    Manipulating coins  Pt held 15 pennies in palm of hand, working on palm to fingertip translation and in-hand manipulation to place coins in slotted container. Completed 2 trials, mod difficulty, dropping several coins during each trial.     Stacking coins  Pt held all coins in palm, working on palm to fingertip translation and in-hand manipulation to bring coins to fingertips and stack coins. Mod difficulty with task.                OT Short Term Goals - 10/01/18 1700      OT SHORT TERM GOAL #1   Title  Pt will be educated onand become independent with HEP for improved use of left hand as dominant during ADLs.    Time  4    Period  Weeks    Status  On-going      OT SHORT TERM GOAL #  2   Title  Patient will increase A/ROM of wrist to [facility] in order to be able to complete dressing tasks with more ease.    Time  4    Period  Weeks    Status  On-going      OT SHORT TERM GOAL #3   Title  Pt will improve left grip strength by at least 15# to improve ability to grasp and hold lightweight items in the left hand during grooming tasks.      Time  4    Period  Weeks    Status  On-going      OT SHORT TERM GOAL #4   Title  Pt will improve left pinch strength by 5# to improve ability to using writing utensils with his left hand.    Time  4    Period  Weeks    Status  On-going      OT SHORT TERM GOAL #5   Title  Pt's left hand edema will decrease in order to be able to use his left dominant hand to complete functional tasks more efficiently.     Time  4    Period  Weeks    Status  On-going      Additional Short Term Goals   Additional Short Term Goals  Yes      OT SHORT TERM GOAL #6   Title  patient will increase his fine motor coordination in his left hand by completing the 9 hole peg test in 28 seconds or less in order to be able to complete motor tasks with more precision and  speed.     Time  2    Period  Weeks    Status  New    Target Date  10/17/18               Plan - 10/03/18 1410    Clinical Impression Statement  A: Pt presents with increased edema today, initiated retrograde massage to left hand to reduce edema and improve functional use during activities. Pt able to complete hand gripper activity with gripper vertical today, remaining at 35#, pt unable to complete small beads today. Also worked on Theatre stage manager, mod difficulty with activities. Verbal cuing for technique during tasks.      Plan  P: Edema management only if necessary. Continue working on in Environmental education officer activities       Patient will benefit from skilled therapeutic intervention in order to improve the following deficits and impairments:  Decreased activity tolerance, Decreased strength, Impaired flexibility, Impaired sensation, Decreased range of motion, Increased edema, Impaired UE functional use  Visit Diagnosis: Stiffness of left hand, not elsewhere classified  Stiffness of left wrist, not elsewhere classified  Other symptoms and signs involving the musculoskeletal system    Problem List Patient Active Problem List   Diagnosis Date Noted  . Failure to thrive in adult 05/26/2017  . Failure to thrive (0-17) 05/25/2017  . Abdominal pulsatile mass 05/25/2017  . Anemia 05/25/2017  . Essential hypertension, benign 05/25/2017  . Gait instability 05/20/2017  . Frequent falls 05/20/2017  . Depression 05/20/2017   Guadelupe Sabin, OTR/L  [phone] 10/03/2018, 2:47 PM  [facility] [address] [place], [place], [address] Phone: [phone]   Fax:  [phone]  Name: Devin Valdez MRN: [id number] Date of Birth: Oct 30, 1941

## 2018-10-08 ENCOUNTER — Encounter (HOSPITAL_COMMUNITY): Payer: Self-pay

## 2018-10-08 ENCOUNTER — Ambulatory Visit (HOSPITAL_COMMUNITY): Payer: Medicare Other

## 2018-10-08 DIAGNOSIS — R29898 Other symptoms and signs involving the musculoskeletal system: Secondary | ICD-10-CM | POA: Diagnosis not present

## 2018-10-08 DIAGNOSIS — M25632 Stiffness of left wrist, not elsewhere classified: Secondary | ICD-10-CM

## 2018-10-08 DIAGNOSIS — M25642 Stiffness of left hand, not elsewhere classified: Secondary | ICD-10-CM | POA: Diagnosis not present

## 2018-10-08 NOTE — Therapy (Signed)
[place], [place], [address] Phone: [phone]   Fax:  [phone]  Occupational Therapy Treatment  Patient Details  Name: Devin Valdez MRN: [id number] Date of Birth: February 18, 1941 Referring Provider (OT): Linard Millers, MD    Encounter Date: 10/08/2018  OT End of Session - 10/08/18 1602    Visit Number  6    Number of Visits  8    Date for OT Re-Evaluation  10/17/18    Authorization Type  1) Medicare 2) [employer]     OT Start Time  1350    OT Stop Time  1430    OT Time Calculation (min)  40 min    Activity Tolerance  Patient tolerated treatment well    Behavior During Therapy  [facility] for tasks assessed/performed       Past Medical History:  Diagnosis Date  . Anemia   . Anxiety   . Depression   . Falls   . Hallucinations   . Hyperlipidemia   . Hypertension     Past Surgical History:  Procedure Laterality Date  . EYE SURGERY    . HERNIA REPAIR      There were no vitals filed for this visit.  Subjective Assessment - 10/08/18 1351    Subjective   S: I still have swelling in this hand.     Currently in Pain?  No/denies         [facility] OT Assessment - 10/08/18 1417      Assessment   Medical Diagnosis  Left hand contracture       Precautions   Precautions  None               OT Treatments/Exercises (OP) - 10/08/18 1417      Exercises   Exercises  Wrist;Hand;Elbow      Elbow Exercises   Other elbow exercises  Elbow extension stretching with table; 15 second hold; 2 times.       Wrist Exercises   Other wrist exercises  Wrist extension stretch was demonstrated on table top and on wall. It was noticed that patient was having difficulty completing stretching with correct form and it was discovered that he is unable to achieve full elbow extension.       Hand Exercises   Other Hand Exercises  Patient started the construction of small wooden table with focus on left  hand coordination with use of nuts and bolts. Patient was able to completed directions with verbal cues from therapist. Assembled 3/4 of platform before session was over.       Manual Therapy   Manual Therapy  Joint mobilization    Manual therapy comments  completed separately from therapeutic exercise    Joint Mobilization  Joint mobilization technique completed to left hand pointer finger; MCP joint and middle finger MCP joint. Muscle energy technique then completed to 2nd and 3rd MCP joint to increase flexion and ability to create a full fist.              OT Education - 10/08/18 1602    Education Details  prayer pose stretch; elbow extension stretch    Person(s) Educated  Patient    Methods  Explanation;Handout;Demonstration    Comprehension  Verbalized understanding;Returned demonstration       OT Short Term Goals - 10/01/18 1700      OT SHORT TERM GOAL #1   Title  Pt will be educated onand become independent with  HEP for improved use of left hand as dominant during ADLs.    Time  4    Period  Weeks    Status  On-going      OT SHORT TERM GOAL #2   Title  Patient will increase A/ROM of wrist to [facility] in order to be able to complete dressing tasks with more ease.    Time  4    Period  Weeks    Status  On-going      OT SHORT TERM GOAL #3   Title  Pt will improve left grip strength by at least 15# to improve ability to grasp and hold lightweight items in the left hand during grooming tasks.      Time  4    Period  Weeks    Status  On-going      OT SHORT TERM GOAL #4   Title  Pt will improve left pinch strength by 5# to improve ability to using writing utensils with his left hand.    Time  4    Period  Weeks    Status  On-going      OT SHORT TERM GOAL #5   Title  Pt's left hand edema will decrease in order to be able to use his left dominant hand to complete functional tasks more efficiently.     Time  4    Period  Weeks    Status  On-going      Additional Short  Term Goals   Additional Short Term Goals  Yes      OT SHORT TERM GOAL #6   Title  patient will increase his fine motor coordination in his left hand by completing the 9 hole peg test in 28 seconds or less in order to be able to complete motor tasks with more precision and speed.     Time  2    Period  Weeks    Status  New    Target Date  10/17/18               Plan - 10/08/18 1604    Clinical Impression Statement  A: Patient was able to achieve a full loose grip post joint mobilization and muscle energy techniques. Education and HEP provided to work on elbow and wrist extension at home.     Plan  P: Continue working on fine motor coordination with use of small table activity.        Patient will benefit from skilled therapeutic intervention in order to improve the following deficits and impairments:  Decreased activity tolerance, Decreased strength, Impaired flexibility, Impaired sensation, Decreased range of motion, Increased edema, Impaired UE functional use  Visit Diagnosis: Stiffness of left hand, not elsewhere classified  Stiffness of left wrist, not elsewhere classified  Other symptoms and signs involving the musculoskeletal system    Problem List Patient Active Problem List   Diagnosis Date Noted  . Failure to thrive in adult 05/26/2017  . Failure to thrive (0-17) 05/25/2017  . Abdominal pulsatile mass 05/25/2017  . Anemia 05/25/2017  . Essential hypertension, benign 05/25/2017  . Gait instability 05/20/2017  . Frequent falls 05/20/2017  . Depression 05/20/2017     Ailene Ravel, OTR/L,CBIS  [phone]  10/08/2018, 4:07 PM  [place] [address] [place], [place], [address] Phone: [phone]   Fax:  [phone]  Name: Devin Valdez MRN: [id number] Date of Birth: August 22, 1941

## 2018-10-08 NOTE — Patient Instructions (Signed)
Elbow extension stretch  Place closed fist on edge of table with thumb up, place opposite hand above elbow to stabilize. With body weight, slowly stretch the elbow to straighten it.  Hold for 15-30 seconds. Complete 3 sets. 3 times a day.      prom wrist extension/prayer stretch  rest your elbows on a table with your hands together. make sure during the exercise to keep your palms together at all times.  now slowly slide your elbows out until you feel a good stretch.  hold for 15-30 seconds, repeat 3 times, 3 times a day

## 2018-10-10 ENCOUNTER — Encounter (HOSPITAL_COMMUNITY): Payer: Self-pay | Admitting: Occupational Therapy

## 2018-10-10 ENCOUNTER — Ambulatory Visit (HOSPITAL_COMMUNITY): Payer: Medicare Other | Admitting: Occupational Therapy

## 2018-10-10 DIAGNOSIS — R29898 Other symptoms and signs involving the musculoskeletal system: Secondary | ICD-10-CM

## 2018-10-10 DIAGNOSIS — M25632 Stiffness of left wrist, not elsewhere classified: Secondary | ICD-10-CM | POA: Diagnosis not present

## 2018-10-10 DIAGNOSIS — M25642 Stiffness of left hand, not elsewhere classified: Secondary | ICD-10-CM

## 2018-10-10 NOTE — Therapy (Signed)
[place], [place], [address] Phone: [phone]   Fax:  [phone]  Occupational Therapy Treatment  Patient Details  Name: Devin Valdez MRN: [id number] Date of Birth: [birth year] Referring Provider (OT): Linard Millers, MD    Encounter Date: 10/10/2018  OT End of Session - 10/10/18 1422    Visit Number  7    Number of Visits  8    Date for OT Re-Evaluation  10/17/18    Authorization Type  1) Medicare 2) [employer]     OT Start Time  1300    OT Stop Time  1343    OT Time Calculation (min)  43 min    Activity Tolerance  Patient tolerated treatment well    Behavior During Therapy  [facility] for tasks assessed/performed       Past Medical History:  Diagnosis Date  . Anemia   . Anxiety   . Depression   . Falls   . Hallucinations   . Hyperlipidemia   . Hypertension     Past Surgical History:  Procedure Laterality Date  . EYE SURGERY    . HERNIA REPAIR      There were no vitals filed for this visit.  Subjective Assessment - 10/10/18 1256    Subjective   S: I've been doing a lot of writing.     Currently in Pain?  No/denies         [facility] OT Assessment - 10/10/18 1256      Assessment   Medical Diagnosis  Left hand contracture       Precautions   Precautions  None               OT Treatments/Exercises (OP) - 10/10/18 1302      Exercises   Exercises  Wrist;Hand;Elbow      Additional Elbow Exercises   Hand Gripper with Small Beads  all beads with gripper at 22#      Hand Exercises   Other Hand Exercises  Pt continued with table construction this session, competing task. Pt requiring verbal cuing for technique and strategies. Focus of task on using left hand to manipulate screws/nuts/bolts working on coordination.       Manual Therapy   Manual Therapy  Joint mobilization    Manual therapy comments  completed separately from therapeutic exercise    Joint Mobilization   Joint mobilization technique completed to left hand pointer finger; MCP joint and middle finger MCP joint. Muscle energy technique then completed to 2nd and 3rd MCP joint to increase flexion and ability to create a full fist.                OT Short Term Goals - 10/01/18 1700      OT SHORT TERM GOAL #1   Title  Pt will be educated onand become independent with HEP for improved use of left hand as dominant during ADLs.    Time  4    Period  Weeks    Status  On-going      OT SHORT TERM GOAL #2   Title  Patient will increase A/ROM of wrist to [facility] in order to be able to complete dressing tasks with more ease.    Time  4    Period  Weeks    Status  On-going      OT SHORT TERM GOAL #3   Title  Pt will improve left grip strength by at least  15# to improve ability to grasp and hold lightweight items in the left hand during grooming tasks.      Time  4    Period  Weeks    Status  On-going      OT SHORT TERM GOAL #4   Title  Pt will improve left pinch strength by 5# to improve ability to using writing utensils with his left hand.    Time  4    Period  Weeks    Status  On-going      OT SHORT TERM GOAL #5   Title  Pt's left hand edema will decrease in order to be able to use his left dominant hand to complete functional tasks more efficiently.     Time  4    Period  Weeks    Status  On-going      Additional Short Term Goals   Additional Short Term Goals  Yes      OT SHORT TERM GOAL #6   Title  patient will increase his fine motor coordination in his left hand by completing the 9 hole peg test in 28 seconds or less in order to be able to complete motor tasks with more precision and speed.     Time  2    Period  Weeks    Status  New    Target Date  10/17/18               Plan - 10/10/18 1422    Clinical Impression Statement  A: Continued with joint mobilization, enabling pt to achieve a full grip prior to initiating activities today. Continued with table  construction, pt completed using left hand for coordination and manipulation of pieces. Pt reporting his hand is not changing and the more he uses it the more it swells. Educated pt to only complete exercises 1-2 times per day instead of all throughout the day, and to continue incorporating during daily tasks.     Plan  P: Reassessment       Patient will benefit from skilled therapeutic intervention in order to improve the following deficits and impairments:  Decreased activity tolerance, Decreased strength, Impaired flexibility, Impaired sensation, Decreased range of motion, Increased edema, Impaired UE functional use  Visit Diagnosis: Stiffness of left hand, not elsewhere classified  Stiffness of left wrist, not elsewhere classified  Other symptoms and signs involving the musculoskeletal system    Problem List Patient Active Problem List   Diagnosis Date Noted  . Failure to thrive in adult 05/26/2017  . Failure to thrive (0-17) 05/25/2017  . Abdominal pulsatile mass 05/25/2017  . Anemia 05/25/2017  . Essential hypertension, benign 05/25/2017  . Gait instability 05/20/2017  . Frequent falls 05/20/2017  . Depression 05/20/2017   Guadelupe Sabin, OTR/L  [phone] 10/10/2018, 2:25 PM  [place] [address] [place], [place], [address] Phone: [phone]   Fax:  [phone]  Name: Devin Valdez MRN: [id number] Date of Birth: 08-Mar-1941

## 2018-10-15 ENCOUNTER — Ambulatory Visit (HOSPITAL_COMMUNITY): Payer: Medicare Other

## 2018-10-15 DIAGNOSIS — M25632 Stiffness of left wrist, not elsewhere classified: Secondary | ICD-10-CM

## 2018-10-15 DIAGNOSIS — M25642 Stiffness of left hand, not elsewhere classified: Secondary | ICD-10-CM | POA: Diagnosis not present

## 2018-10-15 DIAGNOSIS — R29898 Other symptoms and signs involving the musculoskeletal system: Secondary | ICD-10-CM

## 2018-10-16 NOTE — Therapy (Signed)
[place] [address] [place], [place], [address] Phone: [phone]   Fax:  [phone]  Occupational Therapy Treatment Reassessment/discharge Patient Details  Name: Devin Valdez MRN: [id number] Date of Birth: 04/28/1941 Referring Provider (OT): Linard Millers, MD    Encounter Date: 10/15/2018  OT End of Session - 10/16/18 0903    Visit Number  8    Number of Visits  8    Authorization Type  1) Medicare 2) [employer]     OT Start Time  1435    OT Stop Time  1515    OT Time Calculation (min)  40 min    Activity Tolerance  Patient tolerated treatment well    Behavior During Therapy  [facility] for tasks assessed/performed       Past Medical History:  Diagnosis Date  . Anemia   . Anxiety   . Depression   . Falls   . Hallucinations   . Hyperlipidemia   . Hypertension     Past Surgical History:  Procedure Laterality Date  . EYE SURGERY    . HERNIA REPAIR      There were no vitals filed for this visit.  Subjective Assessment - 10/16/18 0903    Subjective   S: The swelling is still there.     Currently in Pain?  No/denies         [facility] OT Assessment - 10/15/18 1446      Assessment   Medical Diagnosis  Left hand contracture     Referring Provider (OT)  Linard Millers, MD       Precautions   Precautions  None      Coordination   Left 9 Hole Peg Test  33.0"   previous: 38.6"     AROM   Right/Left Wrist  Left    Left Wrist Extension  44 Degrees   previous: 13   Left Wrist Flexion  64 Degrees   previous; 25   Left Wrist Radial Deviation  12 Degrees   previous: 23   Left Wrist Ulnar Deviation  30 Degrees   previous: 20     PROM   PROM Assessment Site  Wrist    Right/Left Wrist  Left    Left Wrist Extension  46 Degrees   previous: 20   Left Wrist Flexion  74 Degrees   previous: 31   Left Wrist Radial Deviation  12 Degrees   previous: 27   Left Wrist Ulnar Deviation  38 Degrees    previous: 24     Strength   Strength Assessment Site  Shoulder    Right/Left Wrist  Left    Left Wrist Flexion  5/5   previous: 3/5   Left Wrist Extension  5/5   previous: 3/5   Left Wrist Radial Deviation  5/5   previous: 3/5   Left Wrist Ulnar Deviation  5/5   previous: 3/5   Left Hand Gross Grasp  Impaired    Left Hand Grip (lbs)  45   previous: 37   Left Hand Lateral Pinch  14 lbs   previous: 7   Left Hand 3 Point Pinch  12 lbs   previous: 10                      OT Education - 10/16/18 0909    Education Details  reviewed progress in therapy, reviewed reassessment results, reviewed HEP    Person(s) Educated  Patient  Methods  Explanation    Comprehension  Verbalized understanding       OT Short Term Goals - 10/15/18 1504      OT SHORT TERM GOAL #1   Title  Pt will be educated on and become independent with HEP for improved use of left hand as dominant during ADLs.    Time  4    Period  Weeks    Status  Achieved      OT SHORT TERM GOAL #2   Title  Patient will increase A/ROM of wrist to [facility] in order to be able to complete dressing tasks with more ease.    Time  4    Period  Weeks    Status  Achieved      OT SHORT TERM GOAL #3   Title  Pt will improve left grip strength by at least 15# to improve ability to grasp and hold lightweight items in the left hand during grooming tasks.      Time  4    Period  Weeks    Status  Partially Met      OT SHORT TERM GOAL #4   Title  Pt will improve left pinch strength by 5# to improve ability to using writing utensils with his left hand.    Time  4    Period  Weeks    Status  Partially Met      OT SHORT TERM GOAL #5   Title  Pt's left hand edema will decrease in order to be able to use his left dominant hand to complete functional tasks more efficiently.     Time  4    Period  Weeks    Status  Achieved      OT SHORT TERM GOAL #6   Title  patient will increase his fine motor coordination in his  left hand by completing the 9 hole peg test in 28 seconds or less in order to be able to complete motor tasks with more precision and speed.     Time  2    Period  Weeks    Status  Partially Met               Plan - 10/16/18 0904    Clinical Impression Statement  A: Reassessment completed this date. Patient has met 3/6 goals with remaining 3 partially met. Patient has shown an increase in ROM, strength, and coordination. He continues to report concern regarding swelling in the left hand. He has an edema glove which was provided to him prior to starting therapy. Patient reports that the glove is too small although he was not wanting to order the correct size at the time and opted for the smaller size. No reports of pain at any session. Patient has functional use of his left hand although during sessions he presents with slower and hesistant movement. Patient perseverates on his left hand not being equal to his right hand. Continued education has been provided at each session on length of therapy and recovery and the importance of continuing to work on his left hand post discharge. patient verbalized understanding. Discharge recommended at this time as patient is independent with HEP and no longer requires OT services.      Plan  P: D/C from OT services with HEP. Patient is to follow up with MD Wedensday.     Consulted and Agree with Plan of Care  Patient       Patient will benefit from  skilled therapeutic intervention in order to improve the following deficits and impairments:  Decreased activity tolerance, Decreased strength, Impaired flexibility, Impaired sensation, Decreased range of motion, Increased edema, Impaired UE functional use  Visit Diagnosis: Stiffness of left hand, not elsewhere classified  Stiffness of left wrist, not elsewhere classified  Other symptoms and signs involving the musculoskeletal system    Problem List Patient Active Problem List   Diagnosis Date Noted   . Failure to thrive in adult 05/26/2017  . Failure to thrive (0-17) 05/25/2017  . Abdominal pulsatile mass 05/25/2017  . Anemia 05/25/2017  . Essential hypertension, benign 05/25/2017  . Gait instability 05/20/2017  . Frequent falls 05/20/2017  . Depression 05/20/2017   OCCUPATIONAL THERAPY DISCHARGE SUMMARY  Visits from Start of Care: 8 Current functional level related to goals / functional outcomes: See above   Remaining deficits: See above   Education / Equipment: See above Plan: Patient agrees to discharge.  Patient goals were partially met. Patient is being discharged due to meeting the stated rehab goals.  ?????         Ailene Ravel, OTR/L,CBIS  [phone]  10/16/2018, 9:10 AM  [place], [place], [address] Phone: [phone]   Fax:  [phone]  Name: DREWEY BEGUE MRN: [id number] Date of Birth: 1941/01/21

## 2018-10-17 ENCOUNTER — Ambulatory Visit (HOSPITAL_COMMUNITY): Payer: Medicare Other | Admitting: Occupational Therapy

## 2018-10-24 DIAGNOSIS — M50322 Other cervical disc degeneration at C5-C6 level: Secondary | ICD-10-CM | POA: Diagnosis not present

## 2018-10-24 DIAGNOSIS — M50323 Other cervical disc degeneration at C6-C7 level: Secondary | ICD-10-CM | POA: Diagnosis not present

## 2018-10-24 DIAGNOSIS — Z9889 Other specified postprocedural states: Secondary | ICD-10-CM | POA: Diagnosis not present

## 2018-10-24 DIAGNOSIS — Z981 Arthrodesis status: Secondary | ICD-10-CM | POA: Diagnosis not present

## 2018-10-27 ENCOUNTER — Other Ambulatory Visit: Payer: Self-pay | Admitting: Family Medicine

## 2018-11-29 ENCOUNTER — Ambulatory Visit (INDEPENDENT_AMBULATORY_CARE_PROVIDER_SITE_OTHER): Payer: Medicare Other | Admitting: Family Medicine

## 2018-11-29 ENCOUNTER — Encounter: Payer: Self-pay | Admitting: Family Medicine

## 2018-11-29 VITALS — BP 128/68 | HR 70 | Temp 98.2°F | Resp 16 | Ht 67.0 in | Wt 144.0 lb

## 2018-11-29 DIAGNOSIS — R531 Weakness: Secondary | ICD-10-CM | POA: Diagnosis not present

## 2018-11-29 DIAGNOSIS — F251 Schizoaffective disorder, depressive type: Secondary | ICD-10-CM

## 2018-11-29 DIAGNOSIS — D649 Anemia, unspecified: Secondary | ICD-10-CM

## 2018-11-29 DIAGNOSIS — R6889 Other general symptoms and signs: Secondary | ICD-10-CM | POA: Diagnosis not present

## 2018-11-29 DIAGNOSIS — I10 Essential (primary) hypertension: Secondary | ICD-10-CM | POA: Diagnosis not present

## 2018-11-29 NOTE — Progress Notes (Signed)
Subjective:    Patient ID: Devin Valdez, male    DOB: 1941-09-30, 78 y.o.   MRN: [id number]  HPI  04/30/18 Patient is here today unaccompanied to establish care.  Patient states that he wanted to change doctors because he believes his previous doctors were conspiring against him.  Obviously this statement is very concerning causing me to probe deeper into his past medical history.  Patient states that approximately 1 year ago, he had a breakdown.  He states that he was in a restaurant and two men  told him that he was about to eat his last meal.  No one else saw these men.  He went to the restroom and these men follow him to the restroom and then repeated the statement to him.  This terrified the patient.  He was unable to tell his wife because he felt that she would not believe him.  He was unable to sleep at night because of the fear of dying.  He states that he would have dreams that would reiterate that he was about to die.  However he was uncertain if they were dreams or if they were hallucinations.  He also reports fears and concerns that sound like delusions.  These delusions would cause him not to eat.  He lost down to 125 pounds.  These fears and delusions made him extremely depressed.  He had no energy.  He would want to leave the home.  He could not sleep.  He denies any suicidal ideation.  His history is very difficult to follow.  He has very tangential thoughts.  It is difficult to follow his rationale and his train of thought.  I frequently have to redirect him to gain more information and more insight into what he is referring to.  I see that approximately 1 year ago, he was in the emergency room in the hospital for vertigo and gait instability.  MRI showed chronic microvascular changes but no explanation.  There are several mentions of hallucination and underlying psychiatric paranoia and the patient was referred to a psychiatrist however he stopped going to her.  Past medical history  is also significant for hypertension although he states that his blood pressure is high here because he is nervous and anxious in the office.  He also has a history of iron deficiency anemia.  He states that he is overdue for a colonoscopy.  He is overdue for prostate exam.  He is uncertain of his immunization status.  At that time, my plan was:  As I explained to the patient, he has several concerns including scheduling a colonoscopy, physical, carpal tunnel, vertigo.  However I believe the biggest issue for the patient presently is his underlying paranoia, delusions, and hallucinations.  I feel that if we do not treat this effectively, I will be unable to treat his other medical problems.  Therefore I would like to start the patient on Abilify 5 mg a day and reassess the patient and 3 weeks for improvement.  At that follow-up, we will address his blood pressure hopefully if the patient is more acceptable to treatment.  I will also obtain a CBC, CMP, and a fasting lipid panel.  05/21/18 Patient is here today for follow-up.  He states that he is taking Abilify 5 mg every other day.  He is not able to tolerate it every day because he states that the medication keeps him awake.  Otherwise he denies any side effects from the medication.  I was able to gain further insight into his past history.  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  Apparently at that time, his sister died suddenly.  He was not made aware that his sister had died until a week after she had been cremated.  Neither he nor his mother could visit her prior to her death.  Patient interprets this that the family was keeping him from her as well as his mother from his sister.  His mother died less than a month later.  This caused the patient to spiral into a deep depression.  He believes this is what triggered some of his hallucinations and underlying issues.  However he denies any hallucinations today.  He continues to demonstrate  very tangential speech that is difficult to follow.  He frequently talks and hyperreligious times about the "enemy" attacking him and challenging him.  When I asked him to clarify, he believes the enemy is the devil.  When I asked him how the enemy attacks him, he cannot provide specific instances but speaks in vague generalities about being persecuted by the enemy.  I did review his lab work which was significant for hyperlipidemia however the patient refuses any medication for hyperlipidemia.  His biggest concern today is bilateral hand pain that he attributes to carpal tunnel syndrome.  He reports burning stinging paresthesias and pain radiating from both wrist into the first second third and fourth fingers of both hands.  He also reports decreasing grip strength and weakness in his hands as well as numbness in his hands.  He has a positive Tinel sign bilaterally as well as positive Phalen sign bilaterally.  There is no wasting of the thenar eminence however he does have moderately decreased grip strength.  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  Therefore I want to try to work slowly and gain the patient's trust.  I have encouraged him to try to take the Abilify every day to reach a more therapeutic level in his bloodstream to try to minimize his delusions and paranoia as well as his hallucinations.  He denies any hallucinations today and he becomes defensive when I speak of paranoid thoughts and delusions.  In the future, I believe the patient would benefit from meeting with a psychiatrist however I will defer that to her next visit when hopefully I have gained more of the patient's trust.  In the meantime I want him to increase the Abilify to 5 mg a day.  He will work on diet and try to eat more fruits and vegetables and less pork and red meat and fried foods to address his cholesterol.  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  06/12/18 Overall, the patient seems to be doing much better.  He states that he is resting better at night.  He is sleeping approximately 7 hours.  He also states that his depression has improved.  His overall outlook is improving.  He feels more optimistic.  His speech is much easier to follow and more coherent and logical.  He is met with a hand surgeon and they are arranging nerve conduction test.  He denies any side effects on the Abilify.  He also denies having paranoid thoughts or delusions.  He again is very defensive about the hallucinations he experienced in  the past.  At that time, my plan was: Patient is smiling today.  He is sleeping better.  He reports feeling less depressed and more optimistic.  His paranoia seems to have improved.  Overall I believe the Abilify is working well.  Therefore I will continue the Abilify 5 mg a day and reassess the patient in 3 to 6 months.  We discussed his cholesterol.  His ten-year risk of cardiovascular disease is almost 15%.  I recommended a statin but he would like to try diet and exercise first.  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  Unbeknownst to me he was referred to a neurosurgeon in [facility] and had neck surgery to relieve the nerve impingement that was causing his symptoms.  From that standpoint the pain in his arms and numbness in his arms have improved.  He also states his legs feel stronger.  However he continues to feel weak after the surgery.  The surgery occurred in October.  Again I have no record of the surgery.  Patient again continues to demonstrate tangential speech.  Is very hard to follow his train of thought.  He discontinued  the Abilify.  In fact he states he never took it.  He states today that his wife told him not to take it and to not tell me that he was not taking it.  I have not met his wife.  He is here today alone.  However today he denies any depression.  He denies any hallucination.  He denies any delusions.  His biggest concern at the present time is feeling weak and tired since surgery.  His blood pressure today is well controlled at 128/68.  He is still taking his losartan.  I recommended that he take iron due to his anemia.  However he is not taking iron.  He states that he is not taking it like he should.  I question if he is taking it at all.  He denies any chest pain shortness of breath or dyspnea on exertion Past Medical History:  Diagnosis Date  . Anemia   . Anxiety   . Depression   . Falls   . Hallucinations   . Hyperlipidemia   . Hypertension    Past Surgical History:  Procedure Laterality Date  . EYE SURGERY    . HERNIA REPAIR     Current Outpatient Medications on File Prior to Visit  Medication Sig Dispense Refill  . ARIPiprazole (ABILIFY) 5 MG tablet Take 1 tablet (5 mg total) by mouth daily. 30 tablet 3  . ferrous sulfate 325 (65 FE) MG tablet Take 1 tablet (325 mg total) by mouth 2 (two) times daily with a meal. (Patient taking differently: Take 325 mg by mouth at bedtime. ) 60 tablet 1  . losartan (COZAAR) 50 MG tablet TAKE ONE (1) TABLET BY MOUTH EVERY DAY FOR BLOOD PRESSURE 90 tablet 3  . Multiple Vitamins-Minerals (CENTRUM SILVER ADULT 50+) TABS Take 1 capsule by mouth 2 (two) times daily. 30 tablet 2  . sildenafil (VIAGRA) 100 MG tablet Take 1 tablet (100 mg total) by mouth daily as needed for erectile dysfunction. 10 tablet 5  . oxyCODONE-acetaminophen (PERCOCET/ROXICET) 5-325 MG tablet      No current facility-administered medications on file prior to visit.    No Known  Allergies Social History   Socioeconomic History  . Marital status: Married    Spouse name: Not on file    . Number of children: Not on file  . Years of education: Not on file  . Highest education level: Not on file  Occupational History  . Not on file  Social Needs  . Financial resource strain: Not on file  . Food insecurity:    Worry: Not on file    Inability: Not on file  . Transportation needs:    Medical: Not on file    Non-medical: Not on file  Tobacco Use  . Smoking status: Former Smoker    Last attempt to quit: 11/21/1985    Years since quitting: 33.0  . Smokeless tobacco: Never Used  Substance and Sexual Activity  . Alcohol use: No  . Drug use: No  . Sexual activity: Yes  Lifestyle  . Physical activity:    Days per week: Not on file    Minutes per session: Not on file  . Stress: Not on file  Relationships  . Social connections:    Talks on phone: Not on file    Gets together: Not on file    Attends religious service: Not on file    Active member of club or organization: Not on file    Attends meetings of clubs or organizations: Not on file    Relationship status: Not on file  . Intimate partner violence:    Fear of current or ex partner: Not on file    Emotionally abused: Not on file    Physically abused: Not on file    Forced sexual activity: Not on file  Other Topics Concern  . Not on file  Social History Narrative  . Not on file   Family History  Problem Relation Age of Onset  . Multiple sclerosis Son   . Bipolar disorder Son       Review of Systems  All other systems reviewed and are negative.      Objective:   Physical Exam Vitals signs reviewed.  Constitutional:      General: He is not in acute distress.    Appearance: He is well-developed. He is not diaphoretic.  HENT:     Right Ear: External ear normal.     Left Ear: External ear normal.     Nose: Nose normal.     Mouth/Throat:     Pharynx: No oropharyngeal exudate.  Eyes:     General: No scleral icterus.    Conjunctiva/sclera: Conjunctivae normal.     Pupils: Pupils are equal,  round, and reactive to light.  Neck:     Musculoskeletal: Normal range of motion and neck supple.     Thyroid: No thyromegaly.     Vascular: No JVD.  Cardiovascular:     Rate and Rhythm: Normal rate and regular rhythm.     Heart sounds: Normal heart sounds. No murmur. No friction rub. No gallop.   Pulmonary:     Effort: Pulmonary effort is normal. No respiratory distress.     Breath sounds: Normal breath sounds. No stridor. No wheezing or rales.  Abdominal:     General: Bowel sounds are normal. There is no distension.     Palpations: Abdomen is soft. There is no mass.     Tenderness: There is no abdominal tenderness. There is no guarding.  Musculoskeletal: Normal range of motion.  Lymphadenopathy:     Cervical: No cervical adenopathy.  Skin:  Findings: No erythema or rash.  Neurological:     Mental Status: He is alert and oriented to person, place, and time.     Cranial Nerves: No cranial nerve deficit.     Sensory: No sensory deficit.     Motor: No abnormal muscle tone.     Coordination: Coordination normal.     Deep Tendon Reflexes: Reflexes normal.  Psychiatric:        Speech: Speech normal.        Behavior: Behavior normal.        Thought Content: Thought content is not paranoid or delusional. Thought content does not include homicidal or suicidal ideation. Thought content does not include homicidal or suicidal plan.           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  At the present time he seems to be doing well.  Patient's affect is unusual.  His thoughts are tangential and disorganized at times but he is extremely polite and able to answer questions appropriately.  He is harm to himself or to anyone else.  Therefore I see no  reason to force psychiatric referral.  We will continue to monitor the patient closely given his weakness, I will check a CBC, CMP, B12 level, iron level, and a TSH.  If his iron levels are low, I would encourage him to be consistent taking the iron.  I will also check his stool for blood and if positive would encourage a colonoscopy/GI referral.  Blood pressures well controlled on losartan which he is adamant that he is taking.  Overall he is doing much better than he was 2 years ago.

## 2018-11-30 LAB — COMPLETE METABOLIC PANEL WITH GFR
AG Ratio: 1.9 (calc) (ref 1.0–2.5)
ALT: 10 U/L (ref 9–46)
AST: 12 U/L (ref 10–35)
Albumin: 4.4 g/dL (ref 3.6–5.1)
Alkaline phosphatase (APISO): 58 U/L (ref 40–115)
BUN/Creatinine Ratio: 14 (calc) (ref 6–22)
BUN: 20 mg/dL (ref 7–25)
CALCIUM: 10 mg/dL (ref 8.6–10.3)
CHLORIDE: 105 mmol/L (ref 98–110)
CO2: 27 mmol/L (ref 20–32)
CREATININE: 1.47 mg/dL — AB (ref 0.70–1.18)
GFR, EST AFRICAN AMERICAN: 53 mL/min/{1.73_m2} — AB (ref >=60)
GFR, EST NON AFRICAN AMERICAN: 45 mL/min/{1.73_m2} — AB (ref >=60)
GLUCOSE: 111 mg/dL — AB (ref 65–99)
Globulin: 2.3 g/dL (calc) (ref 1.9–3.7)
Potassium: 4.7 mmol/L (ref 3.5–5.3)
Sodium: 141 mmol/L (ref 135–146)
TOTAL PROTEIN: 6.7 g/dL (ref 6.1–8.1)
Total Bilirubin: 0.5 mg/dL (ref 0.2–1.2)

## 2018-11-30 LAB — CBC WITH DIFFERENTIAL/PLATELET
Absolute Monocytes: 546 cells/uL (ref 200–950)
BASOS PCT: 0.7 %
Basophils Absolute: 29 cells/uL (ref 0–200)
Eosinophils Absolute: 109 cells/uL (ref 15–500)
Eosinophils Relative: 2.6 %
HCT: 35.2 % — ABNORMAL LOW (ref 38.5–50.0)
HEMOGLOBIN: 11.4 g/dL — AB (ref 13.2–17.1)
Lymphs Abs: 806 cells/uL — ABNORMAL LOW (ref 850–3900)
MCH: 27.5 pg (ref 27.0–33.0)
MCHC: 32.4 g/dL (ref 32.0–36.0)
MCV: 85 fL (ref 80.0–100.0)
MPV: 10.8 fL (ref 7.5–12.5)
Monocytes Relative: 13 %
NEUTROS ABS: 2709 {cells}/uL (ref 1500–7800)
Neutrophils Relative %: 64.5 %
PLATELETS: 244 10*3/uL (ref 140–400)
RBC: 4.14 10*6/uL — AB (ref 4.20–5.80)
RDW: 12.3 % (ref 11.0–15.0)
TOTAL LYMPHOCYTE: 19.2 %
WBC: 4.2 10*3/uL (ref 3.8–10.8)

## 2018-11-30 LAB — IRON: Iron: 82 ug/dL (ref 50–180)

## 2018-11-30 LAB — TSH: TSH: 1.46 m[IU]/L (ref 0.40–4.50)

## 2018-11-30 LAB — VITAMIN B12: Vitamin B-12: 527 pg/mL (ref 200–1100)

## 2018-12-03 ENCOUNTER — Other Ambulatory Visit: Payer: Medicare Other

## 2018-12-03 DIAGNOSIS — D649 Anemia, unspecified: Secondary | ICD-10-CM | POA: Diagnosis not present

## 2018-12-04 LAB — FECAL GLOBIN BY IMMUNOCHEMISTRY
FECAL GLOBIN RESULT: NOT DETECTED
MICRO NUMBER: 46365
SPECIMEN QUALITY:: ADEQUATE

## 2018-12-07 ENCOUNTER — Encounter: Payer: Self-pay | Admitting: Family Medicine

## 2018-12-07 DIAGNOSIS — M4802 Spinal stenosis, cervical region: Secondary | ICD-10-CM | POA: Insufficient documentation

## 2018-12-14 ENCOUNTER — Ambulatory Visit: Payer: Medicare Other | Admitting: Family Medicine

## 2019-05-09 ENCOUNTER — Other Ambulatory Visit: Payer: Self-pay

## 2019-05-09 ENCOUNTER — Encounter: Payer: Self-pay | Admitting: Family Medicine

## 2019-05-09 ENCOUNTER — Ambulatory Visit (INDEPENDENT_AMBULATORY_CARE_PROVIDER_SITE_OTHER): Payer: Medicare Other | Admitting: Family Medicine

## 2019-05-09 VITALS — BP 130/68 | HR 74 | Temp 98.3°F | Resp 12 | Ht 67.0 in | Wt 143.0 lb

## 2019-05-09 DIAGNOSIS — Z125 Encounter for screening for malignant neoplasm of prostate: Secondary | ICD-10-CM

## 2019-05-09 DIAGNOSIS — N183 Chronic kidney disease, stage 3 unspecified: Secondary | ICD-10-CM

## 2019-05-09 DIAGNOSIS — Z13220 Encounter for screening for lipoid disorders: Secondary | ICD-10-CM | POA: Diagnosis not present

## 2019-05-09 DIAGNOSIS — D539 Nutritional anemia, unspecified: Secondary | ICD-10-CM | POA: Diagnosis not present

## 2019-05-09 NOTE — Progress Notes (Signed)
Subjective:    Patient ID: Devin Valdez, male    DOB: 1941-06-14, 78 y.o.   MRN: [id number]  Medication Refill    04/30/18 Patient is here today unaccompanied to establish care.  Patient states that he wanted to change doctors because he believes his previous doctors were conspiring against him.  Obviously this statement is very concerning causing me to probe deeper into his past medical history.  Patient states that approximately 1 year ago, he had a breakdown.  He states that he was in a restaurant and two men  told him that he was about to eat his last meal.  No one else saw these men.  He went to the restroom and these men follow him to the restroom and then repeated the statement to him.  This terrified the patient.  He was unable to tell his wife because he felt that she would not believe him.  He was unable to sleep at night because of the fear of dying.  He states that he would have dreams that would reiterate that he was about to die.  However he was uncertain if they were dreams or if they were hallucinations.  He also reports fears and concerns that sound like delusions.  These delusions would cause him not to eat.  He lost down to 125 pounds.  These fears and delusions made him extremely depressed.  He had no energy.  He would want to leave the home.  He could not sleep.  He denies any suicidal ideation.  His history is very difficult to follow.  He has very tangential thoughts.  It is difficult to follow his rationale and his train of thought.  I frequently have to redirect him to gain more information and more insight into what he is referring to.  I see that approximately 1 year ago, he was in the emergency room in the hospital for vertigo and gait instability.  MRI showed chronic microvascular changes but no explanation.  There are several mentions of hallucination and underlying psychiatric paranoia and the patient was referred to a psychiatrist however he stopped going to her.  Past  medical history is also significant for hypertension although he states that his blood pressure is high here because he is nervous and anxious in the office.  He also has a history of iron deficiency anemia.  He states that he is overdue for a colonoscopy.  He is overdue for prostate exam.  He is uncertain of his immunization status.  At that time, my plan was:  As I explained to the patient, he has several concerns including scheduling a colonoscopy, physical, carpal tunnel, vertigo.  However I believe the biggest issue for the patient presently is his underlying paranoia, delusions, and hallucinations.  I feel that if we do not treat this effectively, I will be unable to treat his other medical problems.  Therefore I would like to start the patient on Abilify 5 mg a day and reassess the patient and 3 weeks for improvement.  At that follow-up, we will address his blood pressure hopefully if the patient is more acceptable to treatment.  I will also obtain a CBC, CMP, and a fasting lipid panel.  05/21/18 Patient is here today for follow-up.  He states that he is taking Abilify 5 mg every other day.  He is not able to tolerate it every day because he states that the medication keeps him awake.  Otherwise he denies any side effects from  the medication.  I was able to gain further insight into his past history.  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  Apparently at that time, his sister died suddenly.  He was not made aware that his sister had died until a week after she had been cremated.  Neither he nor his mother could visit her prior to her death.  Patient interprets this that the family was keeping him from her as well as his mother from his sister.  His mother died less than a month later.  This caused the patient to spiral into a deep depression.  He believes this is what triggered some of his hallucinations and underlying issues.  However he denies any hallucinations today.  He continues  to demonstrate very tangential speech that is difficult to follow.  He frequently talks and hyperreligious times about the "enemy" attacking him and challenging him.  When I asked him to clarify, he believes the enemy is the devil.  When I asked him how the enemy attacks him, he cannot provide specific instances but speaks in vague generalities about being persecuted by the enemy.  I did review his lab work which was significant for hyperlipidemia however the patient refuses any medication for hyperlipidemia.  His biggest concern today is bilateral hand pain that he attributes to carpal tunnel syndrome.  He reports burning stinging paresthesias and pain radiating from both wrist into the first second third and fourth fingers of both hands.  He also reports decreasing grip strength and weakness in his hands as well as numbness in his hands.  He has a positive Tinel sign bilaterally as well as positive Phalen sign bilaterally.  There is no wasting of the thenar eminence however he does have moderately decreased grip strength.  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  Therefore I want to try to work slowly and gain the patient's trust.  I have encouraged him to try to take the Abilify every day to reach a more therapeutic level in his bloodstream to try to minimize his delusions and paranoia as well as his hallucinations.  He denies any hallucinations today and he becomes defensive when I speak of paranoid thoughts and delusions.  In the future, I believe the patient would benefit from meeting with a psychiatrist however I will defer that to her next visit when hopefully I have gained more of the patient's trust.  In the meantime I want him to increase the Abilify to 5 mg a day.  He will work on diet and try to eat more fruits and vegetables and less pork and red meat and fried foods to address his cholesterol.  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  06/12/18 Overall, the patient seems to be doing much better.  He states that he is resting better at night.  He is sleeping approximately 7 hours.  He also states that his depression has improved.  His overall outlook is improving.  He feels more optimistic.  His speech is much easier to follow and more coherent and logical.  He is met with a hand surgeon and they are arranging nerve conduction test.  He denies any side effects on the Abilify.  He also denies having paranoid thoughts or delusions.  He again is very defensive about the hallucinations  he experienced in the past.  At that time, my plan was: Patient is smiling today.  He is sleeping better.  He reports feeling less depressed and more optimistic.  His paranoia seems to have improved.  Overall I believe the Abilify is working well.  Therefore I will continue the Abilify 5 mg a day and reassess the patient in 3 to 6 months.  We discussed his cholesterol.  His ten-year risk of cardiovascular disease is almost 15%.  I recommended a statin but he would like to try diet and exercise first.  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  Unbeknownst to me he was referred to a neurosurgeon in [facility] and had neck surgery to relieve the nerve impingement that was causing his symptoms.  From that standpoint the pain in his arms and numbness in his arms have improved.  He also states his legs feel stronger.  However he continues to feel weak after the surgery.  The surgery occurred in October.  Again I have no record of the surgery.  Patient again continues to demonstrate tangential speech.  Is very hard to follow his train of thought.  He  discontinued the Abilify.  In fact he states he never took it.  He states today that his wife told him not to take it and to not tell me that he was not taking it.  I have not met his wife.  He is here today alone.  However today he denies any depression.  He denies any hallucination.  He denies any delusions.  His biggest concern at the present time is feeling weak and tired since surgery.  His blood pressure today is well controlled at 128/68.  He is still taking his losartan.  I recommended that he take iron due to his anemia.  However he is not taking iron.  He states that he is not taking it like he should.  I question if he is taking it at all.  He denies any chest pain shortness of breath or dyspnea on exertion.  At that time, my plan was: Patient refuses referral to psychiatry.  At the present time he seems to be doing well.  Patient's affect is unusual.  His thoughts are tangential and disorganized at times but he is extremely polite and able to answer questions appropriately.  He is harm to himself or to anyone else.  Therefore I see no reason to force psychiatric referral.  We will continue to monitor the patient closely given his weakness, I will check a CBC, CMP, B12 level, iron level, and a TSH.  If his iron levels are low, I would encourage him to be consistent taking the iron.  I will also check his stool for blood and if positive would encourage a colonoscopy/GI referral.  Blood pressures well controlled on losartan which he is adamant that he is taking.  Overall he is doing much better than he was 2 years ago.  05/09/19 Patient states that he has been doing very good since I last saw him.  The pain in his arms and neck have improved dramatically.  He has not been exercising as much as before due to the COVID-19 pandemic but otherwise he is doing well.  He denies any depression.  He denies any suicidal ideation.  He  denies any hallucinations.  He does report occasional anxiety as he lives with  his wife and is caring for his son who has MS and bipolar.  However emotionally he seems to be in his good spirits as of ever seen the patient.  He continues to have tangential speech however his speech is not pressured.  His judgment seems normal today.  His mood and affect is normal.  He does have chronic kidney disease on his last lab work in January.  His GFR was near 50.  I have recommended that he avoid NSAIDs and control his blood pressure.  His blood pressure today is 130/68.  I have also encouraged the patient to drink more fluids and water Past Medical History:  Diagnosis Date   Anemia    Anxiety    Cervical stenosis of spinal canal    mri 8/19- severe   Depression    Falls    Hallucinations    Hyperlipidemia    Hypertension    Past Surgical History:  Procedure Laterality Date   EYE SURGERY     HERNIA REPAIR     POSTERIOR CERVICAL LAMINECTOMY     with fusion C3-C7 10/19- Dr. Brigitte Pulse at [facility]   Current Outpatient Medications on File Prior to Visit  Medication Sig Dispense Refill   ARIPiprazole (ABILIFY) 5 MG tablet Take 1 tablet (5 mg total) by mouth daily. (Patient not taking: Reported on 11/29/2018) 30 tablet 3   ferrous sulfate 325 (65 FE) MG tablet Take 1 tablet (325 mg total) by mouth 2 (two) times daily with a meal. (Patient taking differently: Take 325 mg by mouth at bedtime. ) 60 tablet 1   losartan (COZAAR) 50 MG tablet TAKE ONE (1) TABLET BY MOUTH EVERY DAY FOR BLOOD PRESSURE 90 tablet 3   Multiple Vitamins-Minerals (CENTRUM SILVER ADULT 50+) TABS Take 1 capsule by mouth 2 (two) times daily. 30 tablet 2   oxyCODONE-acetaminophen (PERCOCET/ROXICET) 5-325 MG tablet Take 1 tablet by mouth at bedtime as needed for moderate pain (Dr. Bertram Gala).      sildenafil (VIAGRA) 100 MG tablet Take 1 tablet (100 mg total) by mouth daily as needed for erectile dysfunction. 10 tablet 5   No current facility-administered medications on file prior to visit.     No Known Allergies Social History   Socioeconomic History   Marital status: Married    Spouse name: Not on file   Number of children: Not on file   Years of education: Not on file   Highest education level: Not on file  Occupational History   Not on file  Social Needs   Financial resource strain: Not on file   Food insecurity    Worry: Not on file    Inability: Not on file   Transportation needs    Medical: Not on file    Non-medical: Not on file  Tobacco Use   Smoking status: Former Smoker    Quit date: 11/21/1985    Years since quitting: 33.4   Smokeless tobacco: Never Used  Substance and Sexual Activity   Alcohol use: No   Drug use: No   Sexual activity: Yes  Lifestyle   Physical activity    Days per week: Not on file    Minutes per session: Not on file   Stress: Not on file  Relationships   Social connections    Talks on phone: Not on file    Gets together: Not on file    Attends religious service:  Not on file    Active member of club or organization: Not on file    Attends meetings of clubs or organizations: Not on file    Relationship status: Not on file   Intimate partner violence    Fear of current or ex partner: Not on file    Emotionally abused: Not on file    Physically abused: Not on file    Forced sexual activity: Not on file  Other Topics Concern   Not on file  Social History Narrative   Not on file   Family History  Problem Relation Age of Onset   Multiple sclerosis Son    Bipolar disorder Son       Review of Systems  All other systems reviewed and are negative.      Objective:   Physical Exam Vitals signs reviewed.  Constitutional:      General: He is not in acute distress.    Appearance: He is well-developed. He is not diaphoretic.  HENT:     Right Ear: External ear normal.     Left Ear: External ear normal.     Nose: Nose normal.     Mouth/Throat:     Pharynx: No oropharyngeal exudate.  Eyes:      General: No scleral icterus.    Conjunctiva/sclera: Conjunctivae normal.     Pupils: Pupils are equal, round, and reactive to light.  Neck:     Musculoskeletal: Normal range of motion and neck supple.     Thyroid: No thyromegaly.     Vascular: No JVD.  Cardiovascular:     Rate and Rhythm: Normal rate and regular rhythm.     Heart sounds: Normal heart sounds. No murmur. No friction rub. No gallop.   Pulmonary:     Effort: Pulmonary effort is normal. No respiratory distress.     Breath sounds: Normal breath sounds. No stridor. No wheezing or rales.  Abdominal:     General: Bowel sounds are normal. There is no distension.     Palpations: Abdomen is soft. There is no mass.     Tenderness: There is no abdominal tenderness. There is no guarding.  Musculoskeletal: Normal range of motion.  Lymphadenopathy:     Cervical: No cervical adenopathy.  Skin:    Findings: No erythema or rash.  Neurological:     Mental Status: He is alert and oriented to person, place, and time.     Cranial Nerves: No cranial nerve deficit.     Sensory: No sensory deficit.     Motor: No abnormal muscle tone.     Coordination: Coordination normal.     Deep Tendon Reflexes: Reflexes normal.  Psychiatric:        Speech: Speech normal.        Behavior: Behavior normal.        Thought Content: Thought content is not paranoid or delusional. Thought content does not include homicidal or suicidal ideation. Thought content does not include homicidal or suicidal plan.           Assessment & Plan:  1. Prostate cancer screening - PSA  2. CKD (chronic kidney disease) stage 3, GFR 30-59 ml/min (HCC) - CBC with Differential/Platelet - COMPLETE METABOLIC PANEL WITH GFR - Lipid panel  I will screen the patient for prostate cancer with a PSA as he is overdue for having this.  His blood pressures well controlled.  Given his history of chronic kidney disease, I will monitor his BMP.  I encouraged the  patient to drink  plenty of water and avoid NSAIDs.  He is also on losartan.  Monitor his fasting lipid panel as well as a CBC because he does have a history of mild anemia.  However his stool studies were negative for blood and he continues to take his iron pill.  I suspect that his anemia is likely due to chronic disease coupled with mild chronic kidney disease and iron deficiency

## 2019-05-12 LAB — COMPLETE METABOLIC PANEL WITH GFR
AG Ratio: 2 (calc) (ref 1.0–2.5)
ALT: 10 U/L (ref 9–46)
AST: 15 U/L (ref 10–35)
Albumin: 4.2 g/dL (ref 3.6–5.1)
Alkaline phosphatase (APISO): 58 U/L (ref 35–144)
BUN/Creatinine Ratio: 16 (calc) (ref 6–22)
BUN: 20 mg/dL (ref 7–25)
CO2: 26 mmol/L (ref 20–32)
Calcium: 9.9 mg/dL (ref 8.6–10.3)
Chloride: 106 mmol/L (ref 98–110)
Creat: 1.24 mg/dL — ABNORMAL HIGH (ref 0.70–1.18)
GFR, Est African American: 65 mL/min/{1.73_m2} (ref >=60)
GFR, Est Non African American: 56 mL/min/{1.73_m2} — ABNORMAL LOW (ref >=60)
Globulin: 2.1 g/dL (calc) (ref 1.9–3.7)
Glucose, Bld: 83 mg/dL (ref 65–99)
Potassium: 4.7 mmol/L (ref 3.5–5.3)
Sodium: 140 mmol/L (ref 135–146)
Total Bilirubin: 0.5 mg/dL (ref 0.2–1.2)
Total Protein: 6.3 g/dL (ref 6.1–8.1)

## 2019-05-12 LAB — LIPID PANEL
Cholesterol: 207 mg/dL — ABNORMAL HIGH (ref <200)
HDL: 61 mg/dL (ref >=40)
LDL Cholesterol (Calc): 129 mg/dL (calc) — ABNORMAL HIGH
Non-HDL Cholesterol (Calc): 146 mg/dL (calc) — ABNORMAL HIGH (ref <130)
Total CHOL/HDL Ratio: 3.4 (calc) (ref <5.0)
Triglycerides: 77 mg/dL (ref <150)

## 2019-05-12 LAB — CBC WITH DIFFERENTIAL/PLATELET
Absolute Monocytes: 488 cells/uL (ref 200–950)
Basophils Absolute: 21 cells/uL (ref 0–200)
Basophils Relative: 0.5 %
Eosinophils Absolute: 98 cells/uL (ref 15–500)
Eosinophils Relative: 2.4 %
HCT: 34 % — ABNORMAL LOW (ref 38.5–50.0)
Hemoglobin: 10.8 g/dL — ABNORMAL LOW (ref 13.2–17.1)
Lymphs Abs: 668 cells/uL — ABNORMAL LOW (ref 850–3900)
MCH: 28.1 pg (ref 27.0–33.0)
MCHC: 31.8 g/dL — ABNORMAL LOW (ref 32.0–36.0)
MCV: 88.3 fL (ref 80.0–100.0)
MPV: 10.5 fL (ref 7.5–12.5)
Monocytes Relative: 11.9 %
Neutro Abs: 2825 cells/uL (ref 1500–7800)
Neutrophils Relative %: 68.9 %
Platelets: 217 10*3/uL (ref 140–400)
RBC: 3.85 10*6/uL — ABNORMAL LOW (ref 4.20–5.80)
RDW: 11.6 % (ref 11.0–15.0)
Total Lymphocyte: 16.3 %
WBC: 4.1 10*3/uL (ref 3.8–10.8)

## 2019-05-12 LAB — IRON: Iron: 104 ug/dL (ref 50–180)

## 2019-05-12 LAB — TEST AUTHORIZATION

## 2019-05-12 LAB — VITAMIN B12: Vitamin B-12: 442 pg/mL (ref 200–1100)

## 2019-05-12 LAB — PSA: PSA: 0.6 ng/mL (ref <=4.0)

## 2019-05-15 ENCOUNTER — Other Ambulatory Visit: Payer: Self-pay

## 2019-05-15 ENCOUNTER — Other Ambulatory Visit: Payer: Medicare Other

## 2019-05-15 DIAGNOSIS — D649 Anemia, unspecified: Secondary | ICD-10-CM | POA: Diagnosis not present

## 2019-05-16 LAB — FECAL GLOBIN BY IMMUNOCHEMISTRY
FECAL GLOBIN RESULT:: NOT DETECTED
MICRO NUMBER:: 602935
SPECIMEN QUALITY:: ADEQUATE

## 2019-05-28 DIAGNOSIS — H43811 Vitreous degeneration, right eye: Secondary | ICD-10-CM | POA: Diagnosis not present

## 2019-05-28 DIAGNOSIS — H43822 Vitreomacular adhesion, left eye: Secondary | ICD-10-CM | POA: Diagnosis not present

## 2019-05-28 DIAGNOSIS — H26492 Other secondary cataract, left eye: Secondary | ICD-10-CM | POA: Diagnosis not present

## 2019-05-28 DIAGNOSIS — Z961 Presence of intraocular lens: Secondary | ICD-10-CM | POA: Diagnosis not present

## 2019-05-28 DIAGNOSIS — H4322 Crystalline deposits in vitreous body, left eye: Secondary | ICD-10-CM | POA: Diagnosis not present

## 2019-07-22 ENCOUNTER — Other Ambulatory Visit: Payer: Self-pay | Admitting: Family Medicine

## 2019-08-06 ENCOUNTER — Ambulatory Visit (INDEPENDENT_AMBULATORY_CARE_PROVIDER_SITE_OTHER): Payer: Medicare Other | Admitting: Family Medicine

## 2019-08-06 ENCOUNTER — Encounter: Payer: Self-pay | Admitting: Family Medicine

## 2019-08-06 ENCOUNTER — Other Ambulatory Visit: Payer: Self-pay

## 2019-08-06 VITALS — BP 158/60 | HR 64 | Temp 99.0°F | Resp 14 | Ht 67.0 in | Wt 143.0 lb

## 2019-08-06 DIAGNOSIS — D508 Other iron deficiency anemias: Secondary | ICD-10-CM | POA: Diagnosis not present

## 2019-08-06 DIAGNOSIS — Z23 Encounter for immunization: Secondary | ICD-10-CM

## 2019-08-06 MED ORDER — DICLOFENAC SODIUM 1 % TD GEL: 2.0000 g | Freq: Four times a day (QID) | TRANSDERMAL | 1 refills | Status: DC

## 2019-08-06 NOTE — Progress Notes (Signed)
Subjective:    Patient ID: Devin Valdez, male    DOB: June 09, 1941, 78 y.o.   MRN: [id number]  Medication Refill    04/30/18 Patient is here today unaccompanied to establish care.  Patient states that he wanted to change doctors because he believes his previous doctors were conspiring against him.  Obviously this statement is very concerning causing me to probe deeper into his past medical history.  Patient states that approximately 1 year ago, he had a breakdown.  He states that he was in a restaurant and two men  told him that he was about to eat his last meal.  No one else saw these men.  He went to the restroom and these men follow him to the restroom and then repeated the statement to him.  This terrified the patient.  He was unable to tell his wife because he felt that she would not believe him.  He was unable to sleep at night because of the fear of dying.  He states that he would have dreams that would reiterate that he was about to die.  However he was uncertain if they were dreams or if they were hallucinations.  He also reports fears and concerns that sound like delusions.  These delusions would cause him not to eat.  He lost down to 125 pounds.  These fears and delusions made him extremely depressed.  He had no energy.  He would want to leave the home.  He could not sleep.  He denies any suicidal ideation.  His history is very difficult to follow.  He has very tangential thoughts.  It is difficult to follow his rationale and his train of thought.  I frequently have to redirect him to gain more information and more insight into what he is referring to.  I see that approximately 1 year ago, he was in the emergency room in the hospital for vertigo and gait instability.  MRI showed chronic microvascular changes but no explanation.  There are several mentions of hallucination and underlying psychiatric paranoia and the patient was referred to a psychiatrist however he stopped going to her.  Past  medical history is also significant for hypertension although he states that his blood pressure is high here because he is nervous and anxious in the office.  He also has a history of iron deficiency anemia.  He states that he is overdue for a colonoscopy.  He is overdue for prostate exam.  He is uncertain of his immunization status.  At that time, my plan was:  As I explained to the patient, he has several concerns including scheduling a colonoscopy, physical, carpal tunnel, vertigo.  However I believe the biggest issue for the patient presently is his underlying paranoia, delusions, and hallucinations.  I feel that if we do not treat this effectively, I will be unable to treat his other medical problems.  Therefore I would like to start the patient on Abilify 5 mg a day and reassess the patient and 3 weeks for improvement.  At that follow-up, we will address his blood pressure hopefully if the patient is more acceptable to treatment.  I will also obtain a CBC, CMP, and a fasting lipid panel.  05/21/18 Patient is here today for follow-up.  He states that he is taking Abilify 5 mg every other day.  He is not able to tolerate it every day because he states that the medication keeps him awake.  Otherwise he denies any side effects from  the medication.  I was able to gain further insight into his past history.  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  Apparently at that time, his sister died suddenly.  He was not made aware that his sister had died until a week after she had been cremated.  Neither he nor his mother could visit her prior to her death.  Patient interprets this that the family was keeping him from her as well as his mother from his sister.  His mother died less than a month later.  This caused the patient to spiral into a deep depression.  He believes this is what triggered some of his hallucinations and underlying issues.  However he denies any hallucinations today.  He continues  to demonstrate very tangential speech that is difficult to follow.  He frequently talks and hyperreligious times about the "enemy" attacking him and challenging him.  When I asked him to clarify, he believes the enemy is the devil.  When I asked him how the enemy attacks him, he cannot provide specific instances but speaks in vague generalities about being persecuted by the enemy.  I did review his lab work which was significant for hyperlipidemia however the patient refuses any medication for hyperlipidemia.  His biggest concern today is bilateral hand pain that he attributes to carpal tunnel syndrome.  He reports burning stinging paresthesias and pain radiating from both wrist into the first second third and fourth fingers of both hands.  He also reports decreasing grip strength and weakness in his hands as well as numbness in his hands.  He has a positive Tinel sign bilaterally as well as positive Phalen sign bilaterally.  There is no wasting of the thenar eminence however he does have moderately decreased grip strength.  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  Therefore I want to try to work slowly and gain the patient's trust.  I have encouraged him to try to take the Abilify every day to reach a more therapeutic level in his bloodstream to try to minimize his delusions and paranoia as well as his hallucinations.  He denies any hallucinations today and he becomes defensive when I speak of paranoid thoughts and delusions.  In the future, I believe the patient would benefit from meeting with a psychiatrist however I will defer that to her next visit when hopefully I have gained more of the patient's trust.  In the meantime I want him to increase the Abilify to 5 mg a day.  He will work on diet and try to eat more fruits and vegetables and less pork and red meat and fried foods to address his cholesterol.  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  06/12/18 Overall, the patient seems to be doing much better.  He states that he is resting better at night.  He is sleeping approximately 7 hours.  He also states that his depression has improved.  His overall outlook is improving.  He feels more optimistic.  His speech is much easier to follow and more coherent and logical.  He is met with a hand surgeon and they are arranging nerve conduction test.  He denies any side effects on the Abilify.  He also denies having paranoid thoughts or delusions.  He again is very defensive about the hallucinations  he experienced in the past.  At that time, my plan was: Patient is smiling today.  He is sleeping better.  He reports feeling less depressed and more optimistic.  His paranoia seems to have improved.  Overall I believe the Abilify is working well.  Therefore I will continue the Abilify 5 mg a day and reassess the patient in 3 to 6 months.  We discussed his cholesterol.  His ten-year risk of cardiovascular disease is almost 15%.  I recommended a statin but he would like to try diet and exercise first.  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  Unbeknownst to me he was referred to a neurosurgeon in [facility] and had neck surgery to relieve the nerve impingement that was causing his symptoms.  From that standpoint the pain in his arms and numbness in his arms have improved.  He also states his legs feel stronger.  However he continues to feel weak after the surgery.  The surgery occurred in October.  Again I have no record of the surgery.  Patient again continues to demonstrate tangential speech.  Is very hard to follow his train of thought.  He  discontinued the Abilify.  In fact he states he never took it.  He states today that his wife told him not to take it and to not tell me that he was not taking it.  I have not met his wife.  He is here today alone.  However today he denies any depression.  He denies any hallucination.  He denies any delusions.  His biggest concern at the present time is feeling weak and tired since surgery.  His blood pressure today is well controlled at 128/68.  He is still taking his losartan.  I recommended that he take iron due to his anemia.  However he is not taking iron.  He states that he is not taking it like he should.  I question if he is taking it at all.  He denies any chest pain shortness of breath or dyspnea on exertion.  At that time, my plan was: Patient refuses referral to psychiatry.  At the present time he seems to be doing well.  Patient's affect is unusual.  His thoughts are tangential and disorganized at times but he is extremely polite and able to answer questions appropriately.  He is harm to himself or to anyone else.  Therefore I see no reason to force psychiatric referral.  We will continue to monitor the patient closely given his weakness, I will check a CBC, CMP, B12 level, iron level, and a TSH.  If his iron levels are low, I would encourage him to be consistent taking the iron.  I will also check his stool for blood and if positive would encourage a colonoscopy/GI referral.  Blood pressures well controlled on losartan which he is adamant that he is taking.  Overall he is doing much better than he was 2 years ago.  05/09/19 Patient states that he has been doing very good since I last saw him.  The pain in his arms and neck have improved dramatically.  He has not been exercising as much as before due to the COVID-19 pandemic but otherwise he is doing well.  He denies any depression.  He denies any suicidal ideation.  He  denies any hallucinations.  He does report occasional anxiety as he lives with  his wife and is caring for his son who has MS and bipolar.  However emotionally he seems to be in his good spirits as of ever seen the patient.  He continues to have tangential speech however his speech is not pressured.  His judgment seems normal today.  His mood and affect is normal.  He does have chronic kidney disease on his last lab work in January.  His GFR was near 50.  I have recommended that he avoid NSAIDs and control his blood pressure.  His blood pressure today is 130/68.  I have also encouraged the patient to drink more fluids and water.  At that time, my plan was: 1. Prostate cancer screening - PSA  2. CKD (chronic kidney disease) stage 3, GFR 30-59 ml/min (HCC) - CBC with Differential/Platelet - COMPLETE METABOLIC PANEL WITH GFR - Lipid panel  I will screen the patient for prostate cancer with a PSA as he is overdue for having this.  His blood pressures well controlled.  Given his history of chronic kidney disease, I will monitor his BMP.  I encouraged the patient to drink plenty of water and avoid NSAIDs.  He is also on losartan.  Monitor his fasting lipid panel as well as a CBC because he does have a history of mild anemia.  However his stool studies were negative for blood and he continues to take his iron pill.  I suspect that his anemia is likely due to chronic disease coupled with mild chronic kidney disease and iron deficiency  08/06/19 At the patient's last visit, he was found to be anemic with a hemoglobin of 10.8.  Follow-up labs showed a normal B12 level.  A normal iron level.  Patient's stool was negative for blood.  Patient denies any bright red blood per rectum however he does have dark stools due to the fact he is taking an iron supplement.  He denies any abdominal pain.  He denies any dysphasia.  His blood pressure today is elevated at 158/60.  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 Brigitte Pulse at [facility]   Current Outpatient Medications on File Prior to Visit  Medication Sig Dispense Refill   ARIPiprazole (ABILIFY) 5 MG tablet Take 1 tablet (5 mg total) by mouth daily. (Patient not taking: Reported on 05/09/2019) 30 tablet 3   ferrous sulfate 325 (65 FE) MG tablet Take 1 tablet (325 mg total) by mouth 2 (two) times daily with a meal. (Patient taking differently: Take 325 mg by mouth at bedtime. ) 60 tablet 1   losartan (COZAAR) 50 MG tablet TAKE ONE (1) TABLET BY MOUTH EVERY DAY FOR BLOOD PRESSURE 90 tablet 3   Multiple Vitamins-Minerals (CENTRUM SILVER ADULT 50+) TABS Take 1 capsule by mouth 2 (two) times daily. 30 tablet 2   oxyCODONE-acetaminophen (PERCOCET/ROXICET) 5-325 MG tablet Take 1 tablet by mouth  at bedtime as needed for moderate pain (Dr. Bertram Gala).      sildenafil (VIAGRA) 100 MG tablet TAKE ONE TABLET ('100MG'$  TOTAL) BY MOUTH DAILY AS NEEDED FOR ERECTILE DYSFUNCTION 10 tablet 5   No current facility-administered medications on file prior to visit.    No Known Allergies Social History   Socioeconomic History   Marital status: Married    Spouse name: Not on file   Number of children: Not on file   Years of education: Not on file   Highest education level: Not on file  Occupational History   Not on file  Social Needs   Financial resource strain: Not on file   Food insecurity    Worry: Not on file    Inability: Not on file   Transportation needs    Medical: Not on file    Non-medical: Not on file  Tobacco Use   Smoking status: Former Smoker    Quit date:  11/21/1985    Years since quitting: 33.7   Smokeless tobacco: Never Used  Substance and Sexual Activity   Alcohol use: No   Drug use: No   Sexual activity: Yes  Lifestyle   Physical activity    Days per week: Not on file    Minutes per session: Not on file   Stress: Not on file  Relationships   Social connections    Talks on phone: Not on file    Gets together: Not on file    Attends religious service: Not on file    Active member of club or organization: Not on file    Attends meetings of clubs or organizations: Not on file    Relationship status: Not on file   Intimate partner violence    Fear of current or ex partner: Not on file    Emotionally abused: Not on file    Physically abused: Not on file    Forced sexual activity: Not on file  Other Topics Concern   Not on file  Social History Narrative   Not on file   Family History  Problem Relation Age of Onset   Multiple sclerosis Son    Bipolar disorder Son       Review of Systems  All other systems reviewed and are negative.      Objective:   Physical Exam Vitals signs reviewed.  Constitutional:      General: He is not in acute distress.    Appearance: He is well-developed. He is not diaphoretic.  HENT:     Right Ear: External ear normal.     Left Ear: External ear normal.     Nose: Nose normal.     Mouth/Throat:     Pharynx: No oropharyngeal exudate.  Eyes:     General: No scleral icterus.    Conjunctiva/sclera: Conjunctivae normal.     Pupils: Pupils are equal, round, and reactive to light.  Neck:     Musculoskeletal: Normal range of motion and neck supple.     Thyroid: No thyromegaly.     Vascular: No JVD.  Cardiovascular:     Rate and Rhythm: Normal rate and regular rhythm.     Heart sounds: Normal heart sounds. No murmur. No friction rub. No gallop.   Pulmonary:     Effort: Pulmonary effort is normal. No respiratory distress.     Breath sounds: Normal breath sounds. No stridor. No  wheezing or rales.  Abdominal:     General: Bowel sounds are normal. There  is no distension.     Palpations: Abdomen is soft. There is no mass.     Tenderness: There is no abdominal tenderness. There is no guarding.  Musculoskeletal: Normal range of motion.  Lymphadenopathy:     Cervical: No cervical adenopathy.  Skin:    Findings: No erythema or rash.  Neurological:     Mental Status: He is alert and oriented to person, place, and time.     Cranial Nerves: No cranial nerve deficit.     Sensory: No sensory deficit.     Motor: No abnormal muscle tone.     Coordination: Coordination normal.     Deep Tendon Reflexes: Reflexes normal.  Psychiatric:        Speech: Speech normal.        Behavior: Behavior normal.        Thought Content: Thought content is not paranoid or delusional. Thought content does not include homicidal or suicidal ideation. Thought content does not include homicidal or suicidal plan.           Assessment & Plan:  Other iron deficiency anemia - Plan: CBC with Differential/Platelet, COMPLETE METABOLIC PANEL WITH GFR  Recheck CBC.  If hemoglobin is dropping further, I would recommend GI consultation despite the fact his stool test was negative for blood.  Check CMP to monitor renal function.  If renal function is stable, I would recommend starting amlodipine to help lower his blood pressure little further given the fact is 158/60.  Recommended a flu shot today

## 2019-08-06 NOTE — Addendum Note (Signed)
Addended by: Shary Decamp B on: 08/06/2019 09:22 AM   Modules accepted: Orders

## 2019-08-07 LAB — COMPLETE METABOLIC PANEL WITH GFR
AG Ratio: 1.8 (calc) (ref 1.0–2.5)
ALT: 12 U/L (ref 9–46)
AST: 14 U/L (ref 10–35)
Albumin: 4.2 g/dL (ref 3.6–5.1)
Alkaline phosphatase (APISO): 68 U/L (ref 35–144)
BUN/Creatinine Ratio: 15 (calc) (ref 6–22)
BUN: 19 mg/dL (ref 7–25)
CO2: 23 mmol/L (ref 20–32)
Calcium: 9.6 mg/dL (ref 8.6–10.3)
Chloride: 105 mmol/L (ref 98–110)
Creat: 1.27 mg/dL — ABNORMAL HIGH (ref 0.70–1.18)
GFR, Est African American: 62 mL/min/{1.73_m2} (ref >=60)
GFR, Est Non African American: 54 mL/min/{1.73_m2} — ABNORMAL LOW (ref >=60)
Globulin: 2.3 g/dL (calc) (ref 1.9–3.7)
Glucose, Bld: 77 mg/dL (ref 65–99)
Potassium: 4.1 mmol/L (ref 3.5–5.3)
Sodium: 141 mmol/L (ref 135–146)
Total Bilirubin: 0.5 mg/dL (ref 0.2–1.2)
Total Protein: 6.5 g/dL (ref 6.1–8.1)

## 2019-08-07 LAB — CBC WITH DIFFERENTIAL/PLATELET
Absolute Monocytes: 686 cells/uL (ref 200–950)
Basophils Absolute: 22 cells/uL (ref 0–200)
Basophils Relative: 0.4 %
Eosinophils Absolute: 130 cells/uL (ref 15–500)
Eosinophils Relative: 2.4 %
HCT: 33.2 % — ABNORMAL LOW (ref 38.5–50.0)
Hemoglobin: 10.8 g/dL — ABNORMAL LOW (ref 13.2–17.1)
Lymphs Abs: 767 cells/uL — ABNORMAL LOW (ref 850–3900)
MCH: 28.5 pg (ref 27.0–33.0)
MCHC: 32.5 g/dL (ref 32.0–36.0)
MCV: 87.6 fL (ref 80.0–100.0)
MPV: 10.4 fL (ref 7.5–12.5)
Monocytes Relative: 12.7 %
Neutro Abs: 3796 cells/uL (ref 1500–7800)
Neutrophils Relative %: 70.3 %
Platelets: 218 10*3/uL (ref 140–400)
RBC: 3.79 10*6/uL — ABNORMAL LOW (ref 4.20–5.80)
RDW: 11.3 % (ref 11.0–15.0)
Total Lymphocyte: 14.2 %
WBC: 5.4 10*3/uL (ref 3.8–10.8)

## 2019-08-15 ENCOUNTER — Other Ambulatory Visit: Payer: Medicare Other

## 2019-08-15 ENCOUNTER — Other Ambulatory Visit: Payer: Self-pay

## 2019-08-15 DIAGNOSIS — D508 Other iron deficiency anemias: Secondary | ICD-10-CM

## 2019-08-16 LAB — FECAL GLOBIN BY IMMUNOCHEMISTRY
FECAL GLOBIN RESULT:: NOT DETECTED
MICRO NUMBER:: 918399
SPECIMEN QUALITY:: ADEQUATE

## 2019-11-20 ENCOUNTER — Other Ambulatory Visit: Payer: Self-pay | Admitting: Family Medicine

## 2019-11-22 HISTORY — PX: TRANSTHORACIC ECHOCARDIOGRAM: SHX275

## 2019-12-06 DIAGNOSIS — Z23 Encounter for immunization: Secondary | ICD-10-CM | POA: Diagnosis not present

## 2019-12-19 ENCOUNTER — Other Ambulatory Visit: Payer: Self-pay

## 2019-12-19 ENCOUNTER — Ambulatory Visit (INDEPENDENT_AMBULATORY_CARE_PROVIDER_SITE_OTHER): Payer: Medicare Other | Admitting: Family Medicine

## 2019-12-19 ENCOUNTER — Encounter: Payer: Self-pay | Admitting: Family Medicine

## 2019-12-19 VITALS — BP 178/80 | HR 70 | Temp 97.5°F | Resp 16 | Ht 67.0 in | Wt 151.0 lb

## 2019-12-19 DIAGNOSIS — I499 Cardiac arrhythmia, unspecified: Secondary | ICD-10-CM

## 2019-12-19 DIAGNOSIS — R6889 Other general symptoms and signs: Secondary | ICD-10-CM | POA: Diagnosis not present

## 2019-12-19 DIAGNOSIS — R5383 Other fatigue: Secondary | ICD-10-CM | POA: Diagnosis not present

## 2019-12-19 DIAGNOSIS — R4781 Slurred speech: Secondary | ICD-10-CM | POA: Diagnosis not present

## 2019-12-19 NOTE — Progress Notes (Signed)
Subjective:    Patient ID: Devin Valdez, male    DOB: 12-05-1940, 79 y.o.   MRN: [id number]  HPI Patient had his first Covid shot about 2 weeks ago.  Since that time, he states that he has felt weak and fatigued.  Specifically he states that he will feel paresthesias in his legs left greater than right.  He will sometimes feel weakness in his legs that comes and goes.  He feels like his legs may give out on him at times.  He also has increased numbness and paresthesias in his arms.  Past medical history is significant for neck surgery due to cervical spinal stenosis.  However his neuropathic symptoms just started about 2 weeks ago.  This morning he was having a conversation with his wife and she felt that he was slurring his speech.  He did not have his teeth then at the time.  He thought this was the cause of it.  He states that it was nothing serious.  However his wife wanted him evaluated.  The slurred speech lasted less than an hour and resolved after he went to lay down but it was a noticeable deficit per his wife's report.  Today he is demonstrating no neurologic deficit.  Cranial nerves II through XII are grossly intact with muscle strength 5/5 equal and symmetric in the upper and lower extremities.  The patient has excellent grip strength.  He denies any saddle anesthesia, bowel or bladder incontinence, or no discernible weakness in his legs.  He has a normal gait.  There is no facial droop.  However the patient does have an irregular heart rate on exam.  Sounds like he is having a PVC every third or fourth beat although I cannot determine this simply from auscultation.  He denies any cough, fever, shortness of breath, chest pain, palpitations, dyspnea on exertion.  Past Medical History:  Diagnosis Date  . Anemia   . Anxiety   . 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 Depression   . Falls   . Hallucinations   . Hyperlipidemia   . Hypertension    Past Surgical  History:  Procedure Laterality Date  . EYE SURGERY    . HERNIA REPAIR    . POSTERIOR CERVICAL LAMINECTOMY     with fusion C3-C7 10/19- Dr. Brigitte Pulse at [facility]   Current Outpatient Medications on File Prior to Visit  Medication Sig Dispense Refill  . diclofenac sodium (VOLTAREN) 1 % GEL Apply 2 g topically 4 (four) times daily. 100 g 1  . ferrous sulfate 325 (65 FE) MG tablet Take 1 tablet (325 mg total) by mouth 2 (two) times daily with a meal. (Patient taking differently: Take 325 mg by mouth at bedtime. ) 60 tablet 1  . losartan (COZAAR) 50 MG tablet TAKE ONE (1) TABLET BY MOUTH EVERY DAY FOR BLOOD PRESSURE 90 tablet 3  . Multiple Vitamins-Minerals (CENTRUM SILVER ADULT 50+) TABS Take 1 capsule by mouth 2 (two) times daily. 30 tablet 2  . oxyCODONE-acetaminophen (PERCOCET/ROXICET) 5-325 MG tablet Take 1 tablet by mouth at bedtime as needed for moderate pain (Dr. Bertram Gala).     . sildenafil (VIAGRA) 100 MG tablet TAKE ONE TABLET (100MG  TOTAL) BY MOUTH DAILY AS NEEDED FOR ERECTILE DYSFUNCTION 10 tablet 5   No current facility-administered medications on file prior to visit.   No Known Allergies Social History   Socioeconomic History  . Marital status: Married  Spouse name: Not on file  . Number of children: Not on file  . Years of education: Not on file  . Highest education level: Not on file  Occupational History  . Not on file  Tobacco Use  . Smoking status: Former Smoker    Quit date: 11/21/1985    Years since quitting: 34.0  . Smokeless tobacco: Never Used  Substance and Sexual Activity  . Alcohol use: No  . Drug use: No  . Sexual activity: Yes  Other Topics Concern  . Not on file  Social History Narrative  . Not on file   Social Determinants of Health   Financial Resource Strain:   . Difficulty of Paying Living Expenses: Not on file  Food Insecurity:   . Worried About Charity fundraiser in the Last Year: Not on file  . Ran Out of Food in the Last Year:  Not on file  Transportation Needs:   . Lack of Transportation (Medical): Not on file  . Lack of Transportation (Non-Medical): Not on file  Physical Activity:   . Days of Exercise per Week: Not on file  . Minutes of Exercise per Session: Not on file  Stress:   . Feeling of Stress : Not on file  Social Connections:   . Frequency of Communication with Friends and Family: Not on file  . Frequency of Social Gatherings with Friends and Family: Not on file  . Attends Religious Services: Not on file  . Active Member of Clubs or Organizations: Not on file  . Attends Archivist Meetings: Not on file  . Marital Status: Not on file  Intimate Partner Violence:   . Fear of Current or Ex-Partner: Not on file  . Emotionally Abused: Not on file  . Physically Abused: Not on file  . Sexually Abused: Not on file      Review of Systems  All other systems reviewed and are negative.      Objective:   Physical Exam Constitutional:      General: He is not in acute distress.    Appearance: Normal appearance. He is normal weight. He is not ill-appearing, toxic-appearing or diaphoretic.  HENT:     Nose: Nose normal.  Eyes:     Extraocular Movements: Extraocular movements intact.     Conjunctiva/sclera: Conjunctivae normal.     Pupils: Pupils are equal, round, and reactive to light.  Neck:     Vascular: No carotid bruit.  Cardiovascular:     Rate and Rhythm: Normal rate. Rhythm irregular.     Heart sounds: Normal heart sounds. No murmur. No friction rub. No gallop.   Pulmonary:     Effort: Pulmonary effort is normal. No respiratory distress.     Breath sounds: Normal breath sounds. No stridor. No wheezing, rhonchi or rales.  Chest:     Chest wall: No tenderness.  Abdominal:     General: Abdomen is flat. Bowel sounds are normal.     Palpations: Abdomen is soft.  Musculoskeletal:     Cervical back: Neck supple. No rigidity.     Right lower leg: No edema.     Left lower leg: No  edema.  Lymphadenopathy:     Cervical: No cervical adenopathy.  Neurological:     General: No focal deficit present.     Mental Status: He is alert and oriented to person, place, and time. Mental status is at baseline.     Cranial Nerves: No cranial nerve deficit.  Sensory: No sensory deficit.     Motor: No weakness.     Coordination: Coordination normal.     Gait: Gait normal.  Psychiatric:        Mood and Affect: Mood normal.        Thought Content: Thought content normal.      EKG today shows normal sinus rhythm with frequent PVCs but no evidence of ischemia or infarction or atrial fibrillation     Assessment & Plan:  Other fatigue - Plan: CBC with Differential/Platelet, COMPLETE METABOLIC PANEL WITH GFR, TSH, Vitamin B12  Irregular heartbeat - Plan: EKG 12-Lead  Slurred speech - Plan: MR Brain W Wo Contrast  I am concerned that the slurred speech this morning could potentially have been a TIA.  The patient has not been taking an aspirin.  Therefore I recommended that he take an aspirin 81 mg a day for stroke prevention.  I would also like to increase his blood pressure medication to better manage his elevated blood pressure.  Increase losartan to 100 mg a day.  Arrange an MRI of the brain.  Also given his paresthesias and fatigue I will check a CBC, CMP, TSH, vitamin B12.  Await the results of his MRI.  There are no carotid bruits today on exam however if the MRI does confirm a history of ischemic insults in the brain, I would recommend getting carotid Dopplers.

## 2019-12-20 ENCOUNTER — Encounter (HOSPITAL_COMMUNITY): Payer: Self-pay | Admitting: Emergency Medicine

## 2019-12-20 ENCOUNTER — Emergency Department (HOSPITAL_COMMUNITY): Payer: Medicare Other

## 2019-12-20 ENCOUNTER — Inpatient Hospital Stay (HOSPITAL_COMMUNITY)
Admission: EM | Admit: 2019-12-20 | Discharge: 2019-12-21 | DRG: 066 | Disposition: A | Discharge status: Home / Self Care | Payer: Medicare Other | Attending: Internal Medicine | Admitting: Internal Medicine

## 2019-12-20 ENCOUNTER — Other Ambulatory Visit: Payer: Self-pay

## 2019-12-20 DIAGNOSIS — R29701 NIHSS score 1: Secondary | ICD-10-CM | POA: Diagnosis present

## 2019-12-20 DIAGNOSIS — I6389 Other cerebral infarction: Secondary | ICD-10-CM

## 2019-12-20 DIAGNOSIS — F32A Depression, unspecified: Secondary | ICD-10-CM | POA: Diagnosis present

## 2019-12-20 DIAGNOSIS — I639 Cerebral infarction, unspecified: Secondary | ICD-10-CM | POA: Diagnosis present

## 2019-12-20 DIAGNOSIS — Z820 Family history of epilepsy and other diseases of the nervous system: Secondary | ICD-10-CM

## 2019-12-20 DIAGNOSIS — R4781 Slurred speech: Secondary | ICD-10-CM | POA: Diagnosis not present

## 2019-12-20 DIAGNOSIS — Z20822 Contact with and (suspected) exposure to covid-19: Secondary | ICD-10-CM | POA: Diagnosis not present

## 2019-12-20 DIAGNOSIS — I6302 Cerebral infarction due to thrombosis of basilar artery: Secondary | ICD-10-CM

## 2019-12-20 DIAGNOSIS — I10 Essential (primary) hypertension: Secondary | ICD-10-CM | POA: Diagnosis not present

## 2019-12-20 DIAGNOSIS — G8314 Monoplegia of lower limb affecting left nondominant side: Secondary | ICD-10-CM | POA: Diagnosis not present

## 2019-12-20 DIAGNOSIS — I493 Ventricular premature depolarization: Secondary | ICD-10-CM | POA: Diagnosis present

## 2019-12-20 DIAGNOSIS — D649 Anemia, unspecified: Secondary | ICD-10-CM | POA: Diagnosis present

## 2019-12-20 DIAGNOSIS — E785 Hyperlipidemia, unspecified: Secondary | ICD-10-CM | POA: Diagnosis present

## 2019-12-20 DIAGNOSIS — Z87891 Personal history of nicotine dependence: Secondary | ICD-10-CM

## 2019-12-20 DIAGNOSIS — D509 Iron deficiency anemia, unspecified: Secondary | ICD-10-CM | POA: Diagnosis not present

## 2019-12-20 DIAGNOSIS — F329 Major depressive disorder, single episode, unspecified: Secondary | ICD-10-CM | POA: Diagnosis present

## 2019-12-20 DIAGNOSIS — R531 Weakness: Secondary | ICD-10-CM | POA: Diagnosis not present

## 2019-12-20 LAB — COMPLETE METABOLIC PANEL WITH GFR
AG Ratio: 2.2 (calc) (ref 1.0–2.5)
ALT: 12 U/L (ref 9–46)
AST: 17 U/L (ref 10–35)
Albumin: 4.6 g/dL (ref 3.6–5.1)
Alkaline phosphatase (APISO): 68 U/L (ref 35–144)
BUN/Creatinine Ratio: 14 (calc) (ref 6–22)
BUN: 17 mg/dL (ref 7–25)
CO2: 26 mmol/L (ref 20–32)
Calcium: 9.8 mg/dL (ref 8.6–10.3)
Chloride: 102 mmol/L (ref 98–110)
Creat: 1.23 mg/dL — ABNORMAL HIGH (ref 0.70–1.18)
GFR, Est African American: 65 mL/min/{1.73_m2} (ref >=60)
GFR, Est Non African American: 56 mL/min/{1.73_m2} — ABNORMAL LOW (ref >=60)
Globulin: 2.1 g/dL (calc) (ref 1.9–3.7)
Glucose, Bld: 90 mg/dL (ref 65–99)
Potassium: 4.5 mmol/L (ref 3.5–5.3)
Sodium: 137 mmol/L (ref 135–146)
Total Bilirubin: 0.4 mg/dL (ref 0.2–1.2)
Total Protein: 6.7 g/dL (ref 6.1–8.1)

## 2019-12-20 LAB — RAPID URINE DRUG SCREEN, HOSP PERFORMED
Amphetamines: NOT DETECTED
Barbiturates: NOT DETECTED
Benzodiazepines: NOT DETECTED
Cocaine: NOT DETECTED
Opiates: NOT DETECTED
Tetrahydrocannabinol: NOT DETECTED

## 2019-12-20 LAB — DIFFERENTIAL
Abs Immature Granulocytes: 0.02 10*3/uL (ref 0.00–0.07)
Basophils Absolute: 0 10*3/uL (ref 0.0–0.1)
Basophils Relative: 1 %
Eosinophils Absolute: 0.2 10*3/uL (ref 0.0–0.5)
Eosinophils Relative: 5 %
Immature Granulocytes: 1 %
Lymphocytes Relative: 25 %
Lymphs Abs: 1.1 10*3/uL (ref 0.7–4.0)
Monocytes Absolute: 0.6 10*3/uL (ref 0.1–1.0)
Monocytes Relative: 14 %
Neutro Abs: 2.4 10*3/uL (ref 1.7–7.7)
Neutrophils Relative %: 54 %

## 2019-12-20 LAB — COMPREHENSIVE METABOLIC PANEL
ALT: 17 U/L (ref 0–44)
AST: 18 U/L (ref 15–41)
Albumin: 4.1 g/dL (ref 3.5–5.0)
Alkaline Phosphatase: 59 U/L (ref 38–126)
Anion gap: 11 (ref 5–15)
BUN: 13 mg/dL (ref 8–23)
CO2: 24 mmol/L (ref 22–32)
Calcium: 9.6 mg/dL (ref 8.9–10.3)
Chloride: 101 mmol/L (ref 98–111)
Creatinine, Ser: 1.13 mg/dL (ref 0.61–1.24)
GFR calc Af Amer: >60 mL/min (ref >60)
GFR calc non Af Amer: >60 mL/min (ref >60)
Glucose, Bld: 117 mg/dL — ABNORMAL HIGH (ref 70–99)
Potassium: 3.9 mmol/L (ref 3.5–5.1)
Sodium: 136 mmol/L (ref 135–145)
Total Bilirubin: 0.8 mg/dL (ref 0.3–1.2)
Total Protein: 6.4 g/dL — ABNORMAL LOW (ref 6.5–8.1)

## 2019-12-20 LAB — CBC WITH DIFFERENTIAL/PLATELET
Absolute Monocytes: 522 cells/uL (ref 200–950)
Basophils Absolute: 32 cells/uL (ref 0–200)
Basophils Relative: 0.7 %
Eosinophils Absolute: 162 cells/uL (ref 15–500)
Eosinophils Relative: 3.6 %
HCT: 34.4 % — ABNORMAL LOW (ref 38.5–50.0)
Hemoglobin: 11.2 g/dL — ABNORMAL LOW (ref 13.2–17.1)
Lymphs Abs: 941 cells/uL (ref 850–3900)
MCH: 28.5 pg (ref 27.0–33.0)
MCHC: 32.6 g/dL (ref 32.0–36.0)
MCV: 87.5 fL (ref 80.0–100.0)
MPV: 10.7 fL (ref 7.5–12.5)
Monocytes Relative: 11.6 %
Neutro Abs: 2844 cells/uL (ref 1500–7800)
Neutrophils Relative %: 63.2 %
Platelets: 198 10*3/uL (ref 140–400)
RBC: 3.93 10*6/uL — ABNORMAL LOW (ref 4.20–5.80)
RDW: 11.3 % (ref 11.0–15.0)
Total Lymphocyte: 20.9 %
WBC: 4.5 10*3/uL (ref 3.8–10.8)

## 2019-12-20 LAB — CBC
HCT: 35.5 % — ABNORMAL LOW (ref 39.0–52.0)
Hemoglobin: 11.1 g/dL — ABNORMAL LOW (ref 13.0–17.0)
MCH: 28.1 pg (ref 26.0–34.0)
MCHC: 31.3 g/dL (ref 30.0–36.0)
MCV: 89.9 fL (ref 80.0–100.0)
Platelets: 192 10*3/uL (ref 150–400)
RBC: 3.95 MIL/uL — ABNORMAL LOW (ref 4.22–5.81)
RDW: 11.7 % (ref 11.5–15.5)
WBC: 4.4 10*3/uL (ref 4.0–10.5)
nRBC: 0 % (ref 0.0–0.2)

## 2019-12-20 LAB — I-STAT CHEM 8, ED
BUN: 13 mg/dL (ref 8–23)
Calcium, Ion: 1.21 mmol/L (ref 1.15–1.40)
Chloride: 103 mmol/L (ref 98–111)
Creatinine, Ser: 1.1 mg/dL (ref 0.61–1.24)
Glucose, Bld: 110 mg/dL — ABNORMAL HIGH (ref 70–99)
HCT: 32 % — ABNORMAL LOW (ref 39.0–52.0)
Hemoglobin: 10.9 g/dL — ABNORMAL LOW (ref 13.0–17.0)
Potassium: 3.9 mmol/L (ref 3.5–5.1)
Sodium: 137 mmol/L (ref 135–145)
TCO2: 26 mmol/L (ref 22–32)

## 2019-12-20 LAB — APTT: aPTT: 27 seconds (ref 24–36)

## 2019-12-20 LAB — URINALYSIS, ROUTINE W REFLEX MICROSCOPIC
Bilirubin Urine: NEGATIVE
Glucose, UA: NEGATIVE mg/dL
Hgb urine dipstick: NEGATIVE
Ketones, ur: NEGATIVE mg/dL
Leukocytes,Ua: NEGATIVE
Nitrite: NEGATIVE
Protein, ur: NEGATIVE mg/dL
Specific Gravity, Urine: 1.005 (ref 1.005–1.030)
pH: 5 (ref 5.0–8.0)

## 2019-12-20 LAB — TSH: TSH: 2.11 mIU/L (ref 0.40–4.50)

## 2019-12-20 LAB — PROTIME-INR
INR: 1 (ref 0.8–1.2)
Prothrombin Time: 13.5 seconds (ref 11.4–15.2)

## 2019-12-20 LAB — ETHANOL: Alcohol, Ethyl (B): <10 mg/dL (ref <10)

## 2019-12-20 LAB — VITAMIN B12: Vitamin B-12: 506 pg/mL (ref 200–1100)

## 2019-12-20 MED ORDER — ACETAMINOPHEN 325 MG PO TABS: 650.0000 mg | ORAL_TABLET | Freq: Four times a day (QID) | ORAL | Status: DC | PRN

## 2019-12-20 MED ORDER — CLOPIDOGREL BISULFATE 75 MG PO TABS
75.0000 mg | ORAL_TABLET | Freq: Every day | ORAL | Status: DC
  Administered 2019-12-21: 75 mg via ORAL
  Filled 2019-12-20: qty 1

## 2019-12-20 MED ORDER — CLOPIDOGREL BISULFATE 300 MG PO TABS
300.0000 mg | ORAL_TABLET | Freq: Once | ORAL | Status: AC
  Administered 2019-12-20: 300 mg via ORAL
  Filled 2019-12-20: qty 1

## 2019-12-20 MED ORDER — FERROUS SULFATE 325 (65 FE) MG PO TABS: 325.0000 mg | ORAL_TABLET | Freq: Every day | ORAL | Status: DC

## 2019-12-20 MED ORDER — ASPIRIN 81 MG PO CHEW
81.0000 mg | CHEWABLE_TABLET | Freq: Once | ORAL | Status: AC
  Administered 2019-12-20: 81 mg via ORAL
  Filled 2019-12-20: qty 1

## 2019-12-20 MED ORDER — STROKE: EARLY STAGES OF RECOVERY BOOK
Freq: Once | Status: AC
  Filled 2019-12-20 (×2): qty 1

## 2019-12-20 MED ORDER — ASPIRIN EC 81 MG PO TBEC
81.0000 mg | DELAYED_RELEASE_TABLET | Freq: Every day | ORAL | Status: DC
  Administered 2019-12-21: 81 mg via ORAL
  Filled 2019-12-20: qty 1

## 2019-12-20 MED ORDER — ADULT MULTIVITAMIN W/MINERALS CH
1.0000 | ORAL_TABLET | Freq: Two times a day (BID) | ORAL | Status: DC
  Administered 2019-12-21: 1 via ORAL
  Filled 2019-12-20: qty 1

## 2019-12-20 MED ORDER — SODIUM CHLORIDE 0.9 % IV SOLN: INTRAVENOUS | Status: DC

## 2019-12-20 MED ORDER — ENOXAPARIN SODIUM 40 MG/0.4ML ~~LOC~~ SOLN
40.0000 mg | SUBCUTANEOUS | Status: DC
  Administered 2019-12-21: 40 mg via SUBCUTANEOUS
  Filled 2019-12-20: qty 0.4

## 2019-12-20 MED ORDER — SENNOSIDES-DOCUSATE SODIUM 8.6-50 MG PO TABS: 1.0000 | ORAL_TABLET | Freq: Every evening | ORAL | Status: DC | PRN

## 2019-12-20 MED ORDER — ATORVASTATIN CALCIUM 80 MG PO TABS
80.0000 mg | ORAL_TABLET | Freq: Every day | ORAL | Status: DC
  Administered 2019-12-21: 80 mg via ORAL
  Filled 2019-12-20: qty 1

## 2019-12-20 MED ORDER — LOSARTAN POTASSIUM 50 MG PO TABS
50.0000 mg | ORAL_TABLET | Freq: Every day | ORAL | Status: DC
  Administered 2019-12-21: 50 mg via ORAL
  Filled 2019-12-20: qty 1

## 2019-12-20 NOTE — Consult Note (Signed)
Requesting Physician: Carlisle Cater PA-C/Dr. Cristescu    Chief Complaint: Left-sided weakness, slurred speech  History obtained from: Patient and Chart     HPI:                                                                                                                                       Devin Valdez is a 79 y.o. male with past medical history significant for hypertension, hyperlipidemia, cervical stenosis presents to the emergency department with slurred speech and left-sided weakness that began 2 days ago.  Patient's last known normal was around 2 PM on 1/27.  Did not notice sudden onset slurred speech which resolved as well as some left-sided weakness that improved.  Patient went to his PCP yesterday who recommended him to start taking aspirin.  MRI brain was ordered as an outpatient.  He also complained of paresthesias and tiredness and so lab work such as CBC, CMP, TSH and B12 was ordered.  Today the patient continued to have weakness mostly in his left leg, difficulty with gait and decided to come to the emergency room.  On arrival blood pressure was greater than 665 systolic.  MRI brain was performed in the ED which showed a right pontine infarction. Neurology was consulted for further recommendations.  Date last known well: 1.27-20 Time last known well: Around 2 PM tPA Given: No outside window NIHSS: 1 Baseline MRS 0     Past Medical History:  Diagnosis Date  . Anemia   . Anxiety   . 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 Depression   . Falls   . Hallucinations   . Hyperlipidemia   . Hypertension     Past Surgical History:  Procedure Laterality Date  . EYE SURGERY    . HERNIA REPAIR    . POSTERIOR CERVICAL LAMINECTOMY     with fusion C3-C7 10/19- Dr. Brigitte Pulse at [facility] History  Problem Relation Age of Onset  . Multiple sclerosis Son   . Bipolar disorder Son    Social History:  reports that he quit smoking about 34 years  ago. He has never used smokeless tobacco. He reports that he does not drink alcohol or use drugs.  Allergies: No Known Allergies  Medications:  I reviewed home medications   ROS:                                                                                                                                     14 systems reviewed and negative except above    Examination:                                                                                                      General: Appears well-developed  Psych: Affect appropriate to situation Eyes: No scleral injection HENT: No OP obstrucion Head: Normocephalic.  Cardiovascular: Normal rate and regular rhythm.  Respiratory: Effort normal and breath sounds normal to anterior ascultation GI: Soft.  No distension. There is no tenderness.  Skin: WDI    Neurological Examination Mental Status: Alert, oriented, thought content appropriate.  Speech fluent without evidence of aphasia. Able to follow 3 step commands without difficulty. Cranial Nerves: II: Visual fields grossly normal,  III,IV, VI: ptosis not present, extra-ocular motions intact bilaterally, pupils equal, round, reactive to light and accommodation V,VII: smile symmetric, facial light touch sensation normal bilaterally VIII: hearing normal bilaterally IX,X: uvula rises symmetrically XI: bilateral shoulder shrug XII: midline tongue extension Motor: Right : Upper extremity   5/5    Left:     Upper extremity   5/5  Lower extremity   5/5     Lower extremity   4+/5 Tone and bulk:normal tone throughout; no atrophy noted Sensory: Pinprick and light touch intact throughout, bilaterally Deep Tendon Reflexes: 2+ and symmetric throughout Plantars: Right: downgoing   Left: downgoing Cerebellar: normal finger-to-nose, normal rapid alternating movements, mild  difficulty with heel-to-shin on the left leg      Lab Results: Basic Metabolic Panel: Recent Labs  Lab 12/19/19 1508 12/20/19 1654 12/20/19 1705  NA 137 136 137  K 4.5 3.9 3.9  CL 102 101 103  CO2 26 24  --   GLUCOSE 90 117* 110*  BUN 17 13 13   CREATININE 1.23* 1.13 1.10  CALCIUM 9.8 9.6  --     CBC: Recent Labs  Lab 12/19/19 1508 12/20/19 1654 12/20/19 1705  WBC 4.5 4.4  --   NEUTROABS 2,844 2.4  --   HGB 11.2* 11.1* 10.9*  HCT 34.4* 35.5* 32.0*  MCV 87.5 89.9  --   PLT 198 192  --     Coagulation Studies: Recent Labs    12/20/19 1654  LABPROT 13.5  INR 1.0    Imaging: CT HEAD WO  CONTRAST  Result Date: 12/20/2019 CLINICAL DATA:  Left leg weakness. EXAM: CT HEAD WITHOUT CONTRAST TECHNIQUE: Contiguous axial images were obtained from the base of the skull through the vertex without intravenous contrast. COMPARISON:  May 20, 2017 FINDINGS: Brain: No evidence of acute infarction, hemorrhage, hydrocephalus, extra-axial collection or mass lesion/mass effect. Vascular: No hyperdense vessel or unexpected calcification. Skull: Normal. Negative for fracture or focal lesion. Sinuses/Orbits: There is a 1.6 cm x 1.1 cm right maxillary sinus polyp versus mucous retention cyst. Other: None. IMPRESSION: 1. No acute intracranial pathology. 2. 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 Electronically Signed   By: Virgina Norfolk M.D.   On: 12/20/2019 19:00   MR BRAIN WO CONTRAST  Result Date: 12/20/2019 CLINICAL DATA:  78 year old male with leg weakness. EXAM: MRI HEAD WITHOUT CONTRAST TECHNIQUE: Multiplanar, multiecho pulse sequences of the brain and surrounding structures were obtained without intravenous contrast. COMPARISON:  Head CT earlier tonight. Brain MRI 05/21/2017. FINDINGS: Brain: Patchy and linear restricted diffusion in the left paracentral pons (series 5, image 65). Faint associated T2 and FLAIR hyperintensity. No mass effect or evidence of  hemorrhagic transformation. No other restricted diffusion identified. Nomidline shift, mass effect, evidence of mass lesion, ventriculomegaly, extra-axial collection or acute intracranial hemorrhage. Cervicomedullary junction and pituitary are within normal limits. Patchy bilateral cerebral white matter T2 and FLAIR hyperintensity appears stable since 2018. No cortical encephalomalacia or chronic cerebral blood products identified. Deep gray nuclei and cerebellum remain within normal limits. Vascular: Major intracranial vascular flow voids are stable since 2018. Skull and upper cervical spine: Normal for age visible cervical spine. Normal bone marrow signal. Sinuses/Orbits: Stable, negative orbits. Mild right ethmoid sinus mucosal thickening today. 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 Other: Mastoids remain clear. Visible internal auditory structures appear normal. Scalp and face soft tissues appear negative. IMPRESSION: 1. 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 No mass effect or hemorrhage. 2. Otherwise stable noncontrast MRI appearance of the brain since 2018. Mild to moderate for age white matter signal changes most commonly due to chronic small vessel disease. Electronically Signed   By: Genevie Ann M.D.   On: 12/20/2019 21:13     ASSESSMENT AND PLAN   Acute ischemic stroke: Right pontine infarct  Risk factors: Hypertension, hyperlipidemia Etiology: Small vessel disease  Recommend #MRA Head and carotid Doppler #Transthoracic Echo  # Start patient on ASA 81 mg and Plavix 75 mg into 3 weeks then aspirin alone.  Loaded with Plavix 300 mg today. #Start or continue Atorvastatin 40 mg/other high intensity statin # BP goal: permissive HTN upto 220/120 mmHg  # HBAIC and Lipid profile # Telemetry monitoring # Frequent neuro checks #stroke swallow screen  Please page stroke NP  Or  PA  Or MD from 8am -4 pm  as this patient from this  time will be  followed by the stroke.   You can look them up on [email or web address]  Password [facility]   Conal Shetley [facility] Pager Number [phone]

## 2019-12-20 NOTE — Progress Notes (Signed)
Pt admitted from the ED with stroke diagnosis, alert and oriented, denies any pain at this time, pt settled in bed with call light at bedside, tele monitor put and verified on pt, safety concern addressed accordingly, was however reassured and will continue to monitor. Devin Valdez, Devin Valdez

## 2019-12-20 NOTE — H&P (Signed)
History and Physical    Devin Valdez SJG:[id number] DOB: [birth year] DOA: 12/20/2019  PCP: [name], MD    Patient coming from: Home    Chief Complaint:  Leg weakness and slurred speech which resolved in the emergency room.  HPI: [name] is a 79 y.o. male with medical history significant of 79 years old male with past medical history of hypertension, hyperlipidemia, anemia, cervical stenosis came with a chief complaint weakness in his legs especially on the left side and slurred speech. He saw his primary doctor yesterday complaining of some general weakness fatigue and some slurred speech.  Primary doctor aware he may have a TIA and recommended MRI as an outpatient.  Today apparently he had the same episode came to emergency room.  ED Course:   In the emergency room he was found to be hypertensive No focal neurologic deficit at time of my examination No slurred speech CT of the head negative MRI acute brainstem infarct in the left paracentral pons no mass-effect or hemorrhage  Patient admitted observation for stroke work-up. Was given aspirin and Plavix in the emergency room  Review of Systems: As per HPI otherwise 10 point review of systems negative.    Past Medical History:  Diagnosis Date  . Anemia   . Anxiety   . 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 Depression   . Falls   . Hallucinations   . Hyperlipidemia   . Hypertension     Past Surgical History:  Procedure Laterality Date  . EYE SURGERY    . HERNIA REPAIR    . POSTERIOR CERVICAL LAMINECTOMY     with fusion C3-C7 10/19- Dr. Brigitte Pulse at [facility]     reports that he quit smoking about 34 years ago. He has never used smokeless tobacco. He reports that he does not drink alcohol or use drugs.  No Known Allergies  Family History  Problem Relation Age of Onset  . Multiple sclerosis Son   . Bipolar disorder Son      Prior to Admission medications   Medication Sig  Start Date End Date Taking? Authorizing Provider  acetaminophen (TYLENOL) 325 MG tablet Take 650 mg by mouth every 6 (six) hours as needed for mild pain or headache.   Yes [provider]  diclofenac sodium (VOLTAREN) 1 % GEL Apply 2 g topically 4 (four) times daily. 08/06/19  Yes [name], MD  ferrous sulfate 325 (65 FE) MG tablet Take 1 tablet (325 mg total) by mouth 2 (two) times daily with a meal. Patient taking differently: Take 325 mg by mouth at bedtime.  05/28/17  Yes Theodis Blaze, MD  losartan (COZAAR) 50 MG tablet TAKE ONE (1) TABLET BY MOUTH EVERY DAY FOR BLOOD PRESSURE Patient taking differently: Take 50 mg by mouth daily.  11/20/19  Yes [name], MD  Multiple Vitamins-Minerals (CENTRUM SILVER ADULT 50+) TABS Take 1 capsule by mouth 2 (two) times daily. 01/22/16  Yes Kindl, Nelda Severe, MD  sildenafil (VIAGRA) 100 MG tablet TAKE ONE TABLET (100MG  TOTAL) BY MOUTH DAILY AS NEEDED FOR ERECTILE DYSFUNCTION Patient taking differently: Take 100 mg by mouth as needed for erectile dysfunction.  07/22/19  Yes [name], MD    Physical Exam: Vitals:   12/20/19 1900 12/20/19 1945 12/20/19 2015 12/20/19 2101  BP: (!) 184/75 (!) 157/64 (!) 178/64 (!) 215/76  Pulse: 61 61 66 68  Resp: 13 14 13  11  Temp:      TempSrc:      SpO2: 100% 98% 98% 99%    Constitutional: NAD, calm, comfortable Vitals:   12/20/19 1900 12/20/19 1945 12/20/19 2015 12/20/19 2101  BP: (!) 184/75 (!) 157/64 (!) 178/64 (!) 215/76  Pulse: 61 61 66 68  Resp: 13 14 13 11   Temp:      TempSrc:      SpO2: 100% 98% 98% 99%   Eyes: PERRL, lids and conjunctivae normal ENMT: Mucous membranes are moist. Posterior pharynx clear of any exudate or lesions.Normal dentition.  Neck: normal, supple, no masses, no thyromegaly Respiratory: clear to auscultation bilaterally, no wheezing, no crackles. Normal respiratory effort. No accessory muscle use.  Cardiovascular: Regular rate and rhythm, no murmurs /  rubs / gallops. No extremity edema. 2+ pedal pulses. No carotid bruits.  Abdomen: no tenderness, no masses palpated. No hepatosplenomegaly. Bowel sounds positive.  Musculoskeletal: no clubbing / cyanosis. No joint deformity upper and lower extremities. Good ROM, no contractures. Normal muscle tone.  Skin: no rashes, lesions, ulcers. No induration Neurologic: CN 2-12 grossly intact. Sensation intact, DTR normal. Strength 5/5 in all 4.  Psychiatric: Normal judgment and insight. Alert and oriented x 3. Normal mood.    Labs on Admission: I have personally reviewed following labs and imaging studies  CBC: Recent Labs  Lab 12/19/19 1508 12/20/19 1654 12/20/19 1705  WBC 4.5 4.4  --   NEUTROABS 2,844 2.4  --   HGB 11.2* 11.1* 10.9*  HCT 34.4* 35.5* 32.0*  MCV 87.5 89.9  --   PLT 198 192  --    Basic Metabolic Panel: Recent Labs  Lab 12/19/19 1508 12/20/19 1654 12/20/19 1705  NA 137 136 137  K 4.5 3.9 3.9  CL 102 101 103  CO2 26 24  --   GLUCOSE 90 117* 110*  BUN 17 13 13   CREATININE 1.23* 1.13 1.10  CALCIUM 9.8 9.6  --    GFR: Estimated Creatinine Clearance: 51.7 mL/min (by C-G formula based on SCr of 1.1 mg/dL). Liver Function Tests: Recent Labs  Lab 12/19/19 1508 12/20/19 1654  AST 17 18  ALT 12 17  ALKPHOS  --  59  BILITOT 0.4 0.8  PROT 6.7 6.4*  ALBUMIN  --  4.1   No results for input(s): LIPASE, AMYLASE in the last 168 hours. No results for input(s): AMMONIA in the last 168 hours. Coagulation Profile: Recent Labs  Lab 12/20/19 1654  INR 1.0   Cardiac Enzymes: No results for input(s): CKTOTAL, CKMB, CKMBINDEX, TROPONINI in the last 168 hours. BNP (last 3 results) No results for input(s): PROBNP in the last 8760 hours. HbA1C: No results for input(s): HGBA1C in the last 72 hours. CBG: No results for input(s): GLUCAP in the last 168 hours. Lipid Profile: No results for input(s): CHOL, HDL, LDLCALC, TRIG, CHOLHDL, LDLDIRECT in the last 72 hours. Thyroid  Function Tests: Recent Labs    12/19/19 1508  TSH 2.11   Anemia Panel: Recent Labs    12/19/19 1508  VITAMINB12 506   Urine analysis:    Component Value Date/Time   COLORURINE STRAW (A) 12/20/2019 1855   APPEARANCEUR CLEAR 12/20/2019 1855   LABSPEC 1.005 12/20/2019 1855   PHURINE 5.0 12/20/2019 1855   GLUCOSEU NEGATIVE 12/20/2019 1855   HGBUR NEGATIVE 12/20/2019 1855   BILIRUBINUR NEGATIVE 12/20/2019 [place] 12/20/2019 [place] NEGATIVE 12/20/2019 1855   UROBILINOGEN 0.2 05/14/2017 1256   NITRITE NEGATIVE 12/20/2019 1855  LEUKOCYTESUR NEGATIVE 12/20/2019 1855    Radiological Exams on Admission: CT HEAD WO CONTRAST  Result Date: 12/20/2019 CLINICAL DATA:  Left leg weakness. EXAM: CT HEAD WITHOUT CONTRAST TECHNIQUE: Contiguous axial images were obtained from the base of the skull through the vertex without intravenous contrast. COMPARISON:  May 20, 2017 FINDINGS: Brain: No evidence of acute infarction, hemorrhage, hydrocephalus, extra-axial collection or mass lesion/mass effect. Vascular: No hyperdense vessel or unexpected calcification. Skull: Normal. Negative for fracture or focal lesion. Sinuses/Orbits: There is a 1.6 cm x 1.1 cm right maxillary sinus polyp versus mucous retention cyst. Other: None. IMPRESSION: 1. No acute intracranial pathology. 2. 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 Electronically Signed   By: Virgina Norfolk M.D.   On: 12/20/2019 19:00   MR BRAIN WO CONTRAST  Result Date: 12/20/2019 CLINICAL DATA:  79 year old male with leg weakness. EXAM: MRI HEAD WITHOUT CONTRAST TECHNIQUE: Multiplanar, multiecho pulse sequences of the brain and surrounding structures were obtained without intravenous contrast. COMPARISON:  Head CT earlier tonight. Brain MRI 05/21/2017. FINDINGS: Brain: Patchy and linear restricted diffusion in the left paracentral pons (series 5, image 65). Faint associated T2 and FLAIR hyperintensity. No  mass effect or evidence of hemorrhagic transformation. No other restricted diffusion identified. Nomidline shift, mass effect, evidence of mass lesion, ventriculomegaly, extra-axial collection or acute intracranial hemorrhage. Cervicomedullary junction and pituitary are within normal limits. Patchy bilateral cerebral white matter T2 and FLAIR hyperintensity appears stable since 2018. No cortical encephalomalacia or chronic cerebral blood products identified. Deep gray nuclei and cerebellum remain within normal limits. Vascular: Major intracranial vascular flow voids are stable since 2018. Skull and upper cervical spine: Normal for age visible cervical spine. Normal bone marrow signal. Sinuses/Orbits: Stable, negative orbits. Mild right ethmoid sinus mucosal thickening today. 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 Other: Mastoids remain clear. Visible internal auditory structures appear normal. Scalp and face soft tissues appear negative. IMPRESSION: 1. 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 No mass effect or hemorrhage. 2. Otherwise stable noncontrast MRI appearance of the brain since 2018. Mild to moderate for age white matter signal changes most commonly due to chronic small vessel disease. Electronically Signed   By: Genevie Ann M.D.   On: 12/20/2019 21:13    EKG: Independently reviewed.  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 No mass effect or hemorrhage. Plan neurology consult was called by ED, aspirin Plavix, Lipitor, lipid profile Carotid ultrasound, echocardiogram  Essential hypertension Allow permissive  hypertension for 24 hours Resume Cozaar  Chronic anemia iron deficiency Ferrous sulfate   Assessment/Plan Active Problems:   Depression   Anemia   Essential hypertension, benign   CVA (cerebral vascular accident) ([place])      DVT prophylaxis: Lovenox Code Status: Full code Family Communication: We will called to notify family about plan Disposition Plan: Home Consults called: Neurology by emergency room Admission status: Observation   Gurinder Toral G Baneza Bartoszek MD [facility]  If 7PM-7AM, please contact night-coverage [email or web address]   12/20/2019, 10:56 PM

## 2019-12-20 NOTE — ED Notes (Signed)
Pt refusing to have anything to eat now.

## 2019-12-20 NOTE — ED Triage Notes (Signed)
Pt here from home with c/o weakness more in his left leg that started 2 days ago,pt feels fine upon arrival to the ED with no deficits,

## 2019-12-20 NOTE — ED Provider Notes (Signed)
[facility] EMERGENCY DEPARTMENT Provider Note   CSN: [id number] Arrival date & time: 12/20/19  1630     History No chief complaint on file.   Devin Valdez is a 79 y.o. male.  Patient presents to the emergency department today with complaint of weakness and slurred speech, now resolved.  Weakness in his legs and he was having difficulty walking.  His wife needed to assist him for about 10 or 15 minutes.  He states that it felt like his left leg was weaker than his right for this period of time.  At that time he had a conversation with his wife who felt that his speech was slurred.  Also His symptoms then improved.  Also reports paresthesias in his hands for the past 2 weeks.  He saw his PCP Dr. Dennard Schaumann yesterday.  Patient also complains of generalized fatigue for the past several weeks.  A battery of tests were ordered including TSH, B12 and these were normal.  MRI brain was scheduled as an outpatient to evaluate for any ischemic insults.  Patient reports walking outside of his house earlier today and was feeling fine.  When he went back inside he then developed another episode of weakness in his legs.  He is uncertain if he had any slurred speech because he did have a conversation with his wife at that time.  Symptoms then resolved and patient returned to normal.  Patient's wife called the PCP office and was encouraged to come to the emergency department.  Patient currently feels normal.  He reports some tingling in his bilateral hands but states that this has been present since his spine surgery 1 or 2 years ago.  No recent illnesses, fevers, flu or cold symptoms.  No coronavirus contacts.  No nausea, vomiting, diarrhea.  No urinary symptoms.        Past Medical History:  Diagnosis Date  . Anemia   . Anxiety   . 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 Depression   . Falls   . Hallucinations   . Hyperlipidemia   . Hypertension     Patient  Active Problem List   Diagnosis Date Noted  . Cervical stenosis of spinal canal   . Failure to thrive in adult 05/26/2017  . Failure to thrive (0-17) 05/25/2017  . Abdominal pulsatile mass 05/25/2017  . Anemia 05/25/2017  . Essential hypertension, benign 05/25/2017  . Gait instability 05/20/2017  . Frequent falls 05/20/2017  . Depression 05/20/2017    Past Surgical History:  Procedure Laterality Date  . EYE SURGERY    . HERNIA REPAIR    . POSTERIOR CERVICAL LAMINECTOMY     with fusion C3-C7 10/19- Dr. Brigitte Pulse at [facility] History  Problem Relation Age of Onset  . Multiple sclerosis Son   . Bipolar disorder Son     Social History   Tobacco Use  . Smoking status: Former Smoker    Quit date: 11/21/1985    Years since quitting: 34.1  . Smokeless tobacco: Never Used  Substance Use Topics  . Alcohol use: No  . Drug use: No    Home Medications Prior to Admission medications   Medication Sig Start Date End Date Taking? Authorizing Provider  diclofenac sodium (VOLTAREN) 1 % GEL Apply 2 g topically 4 (four) times daily. 08/06/19   [name], MD  ferrous sulfate 325 (65 FE) MG tablet Take 1 tablet (325  mg total) by mouth 2 (two) times daily with a meal. Patient taking differently: Take 325 mg by mouth at bedtime.  05/28/17   Theodis Blaze, MD  losartan (COZAAR) 50 MG tablet TAKE ONE (1) TABLET BY MOUTH EVERY DAY FOR BLOOD PRESSURE 11/20/19   [name], MD  Multiple Vitamins-Minerals (CENTRUM SILVER ADULT 50+) TABS Take 1 capsule by mouth 2 (two) times daily. 01/22/16   Billy Fischer, MD  oxyCODONE-acetaminophen (PERCOCET/ROXICET) 5-325 MG tablet Take 1 tablet by mouth at bedtime as needed for moderate pain (Dr. Bertram Gala).  08/30/18   [provider]  sildenafil (VIAGRA) 100 MG tablet TAKE ONE TABLET (100MG  TOTAL) BY MOUTH DAILY AS NEEDED FOR ERECTILE DYSFUNCTION 07/22/19   [name], MD    Allergies    Patient has no known  allergies.  Review of Systems   Review of Systems  Constitutional: Positive for fatigue. Negative for fever.  HENT: Negative for rhinorrhea and sore throat.   Eyes: Negative for redness.  Respiratory: Negative for cough.   Cardiovascular: Negative for chest pain and leg swelling.  Gastrointestinal: Negative for abdominal pain, diarrhea, nausea and vomiting.  Genitourinary: Negative for dysuria.  Musculoskeletal: Negative for myalgias.  Skin: Negative for rash.  Neurological: Positive for speech difficulty, weakness and numbness (paresthesias). Negative for headaches.    Physical Exam Updated Vital Signs BP (!) 204/79   Pulse 75   Temp 98 F (36.7 C) (Oral)   Resp 15   SpO2 98%   Physical Exam Vitals and nursing note reviewed.  Constitutional:      Appearance: He is well-developed.  HENT:     Head: Normocephalic and atraumatic.     Right Ear: Tympanic membrane, ear canal and external ear normal.     Left Ear: Tympanic membrane, ear canal and external ear normal.     Nose: Nose normal.     Mouth/Throat:     Pharynx: Uvula midline.  Eyes:     General: Lids are normal.     Conjunctiva/sclera: Conjunctivae normal.     Pupils: Pupils are equal, round, and reactive to light.  Neck:     Vascular: No carotid bruit.  Cardiovascular:     Rate and Rhythm: Normal rate. Rhythm irregular.     Heart sounds: No murmur.     Comments: Frequent ectopy Pulmonary:     Effort: Pulmonary effort is normal.     Breath sounds: Normal breath sounds.  Abdominal:     Palpations: Abdomen is soft.     Tenderness: There is no abdominal tenderness. There is no guarding or rebound.  Musculoskeletal:        General: Normal range of motion.     Cervical back: Normal range of motion and neck supple. No tenderness or bony tenderness.  Skin:    General: Skin is warm and dry.  Neurological:     Mental Status: He is alert and oriented to person, place, and time.     GCS: GCS eye subscore is 4. GCS  verbal subscore is 5. GCS motor subscore is 6.     Cranial Nerves: No cranial nerve deficit.     Sensory: No sensory deficit.     Motor: No abnormal muscle tone.     Coordination: Coordination normal.     Gait: Gait normal.     Deep Tendon Reflexes: Reflexes are normal and symmetric.     ED Results / Procedures / Treatments   Labs (all labs ordered  are listed, but only abnormal results are displayed) Labs Reviewed  CBC - Abnormal; Notable for the following components:      Result Value   RBC 3.95 (*)    Hemoglobin 11.1 (*)    HCT 35.5 (*)    All other components within normal limits  COMPREHENSIVE METABOLIC PANEL - Abnormal; Notable for the following components:   Glucose, Bld 117 (*)    Total Protein 6.4 (*)    All other components within normal limits  I-STAT CHEM 8, ED - Abnormal; Notable for the following components:   Glucose, Bld 110 (*)    Hemoglobin 10.9 (*)    HCT 32.0 (*)    All other components within normal limits  ETHANOL  PROTIME-INR  APTT  DIFFERENTIAL  RAPID URINE DRUG SCREEN, HOSP PERFORMED  URINALYSIS, ROUTINE W REFLEX MICROSCOPIC    ED ECG REPORT   Date: 12/20/2019  Rate: 67  Rhythm: normal sinus rhythm and premature ventricular contractions (PVC)  QRS Axis: normal  Intervals: normal  ST/T Wave abnormalities: normal  Conduction Disutrbances:none  Narrative Interpretation: heavy PVC burden, similar to yesterday  Old EKG Reviewed: unchanged  I have personally reviewed the EKG tracing and agree with the computerized printout as noted.  Radiology CT HEAD WO CONTRAST  Result Date: 12/20/2019 CLINICAL DATA:  Left leg weakness. EXAM: CT HEAD WITHOUT CONTRAST TECHNIQUE: Contiguous axial images were obtained from the base of the skull through the vertex without intravenous contrast. COMPARISON:  May 20, 2017 FINDINGS: Brain: No evidence of acute infarction, hemorrhage, hydrocephalus, extra-axial collection or mass lesion/mass effect. Vascular: No  hyperdense vessel or unexpected calcification. Skull: Normal. Negative for fracture or focal lesion. Sinuses/Orbits: There is a 1.6 cm x 1.1 cm right maxillary sinus polyp versus mucous retention cyst. Other: None. IMPRESSION: 1. No acute intracranial pathology. 2. 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 Electronically Signed   By: Virgina Norfolk M.D.   On: 12/20/2019 19:00   MR BRAIN WO CONTRAST  Result Date: 12/20/2019 CLINICAL DATA:  79 year old male with leg weakness. EXAM: MRI HEAD WITHOUT CONTRAST TECHNIQUE: Multiplanar, multiecho pulse sequences of the brain and surrounding structures were obtained without intravenous contrast. COMPARISON:  Head CT earlier tonight. Brain MRI 05/21/2017. FINDINGS: Brain: Patchy and linear restricted diffusion in the left paracentral pons (series 5, image 65). Faint associated T2 and FLAIR hyperintensity. No mass effect or evidence of hemorrhagic transformation. No other restricted diffusion identified. Nomidline shift, mass effect, evidence of mass lesion, ventriculomegaly, extra-axial collection or acute intracranial hemorrhage. Cervicomedullary junction and pituitary are within normal limits. Patchy bilateral cerebral white matter T2 and FLAIR hyperintensity appears stable since 2018. No cortical encephalomalacia or chronic cerebral blood products identified. Deep gray nuclei and cerebellum remain within normal limits. Vascular: Major intracranial vascular flow voids are stable since 2018. Skull and upper cervical spine: Normal for age visible cervical spine. Normal bone marrow signal. Sinuses/Orbits: Stable, negative orbits. Mild right ethmoid sinus mucosal thickening today. 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 Other: Mastoids remain clear. Visible internal auditory structures appear normal. Scalp and face soft tissues appear negative. IMPRESSION: 1. 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 No mass effect or hemorrhage. 2. Otherwise stable noncontrast MRI appearance of the brain since 2018. Mild to moderate for age white matter signal changes most commonly due to chronic small vessel disease. Electronically Signed   By: Genevie Ann M.D.   On: 12/20/2019 21:13    Procedures Procedures (  including critical care time)  Medications Ordered in ED Medications - No data to display  ED Course  I have reviewed the triage vital signs and the nursing notes.  Pertinent labs & imaging results that were available during my care of the patient were reviewed by me and considered in my medical decision making (see chart for details).  Patient seen and examined. Work-up reviewed, both yesterday and today's lab work.  CT reviewed.  I was able to review Dr. Samella Parr note from yesterday and saw the plan which was for MRI and carotid Dopplers if he did have any positive findings.  I discussed the patient and his history with Dr. Sherry Ruffing who will see the patient.  We will proceed with MRI brain at this time.  Vital signs reviewed and are as follows: BP (!) 204/79   Pulse 75   Temp 98 F (36.7 C) (Oral)   Resp 15   SpO2 98%   9:46 PM MRI shows acute pontine infarction.  Patient updated.  I spoke with his wife over the telephone.  Patient agrees to be admitted.  I spoke with Dr. [name].  He requests that I give the patient a load of Plavix and aspirin.  He will consult on patient.  Request admission to hospitalist.  Page placed.  Blood pressure continues to be elevated.  Will allow for permissive hypertension given ischemic infarct.  Will monitor.  10:15 PM Discussed with Dr. Criss Alvine.      MDM Rules/Calculators/A&P                      Admit.    Final Clinical Impression(s) / ED Diagnoses Final diagnoses:  Acute brainstem infarction [facility])  Essential hypertension  PVC (premature ventricular contraction)    Rx / DC Orders ED Discharge Orders    None       Carlisle Cater, Hershal Coria 12/20/19 2215    Tegeler, Gwenyth Allegra, MD 12/21/19 [id number]

## 2019-12-20 NOTE — ED Notes (Signed)
Josh, PA, notified of pt's increased HTN

## 2019-12-21 ENCOUNTER — Inpatient Hospital Stay (HOSPITAL_COMMUNITY): Payer: Medicare Other

## 2019-12-21 ENCOUNTER — Observation Stay (HOSPITAL_COMMUNITY): Payer: Medicare Other

## 2019-12-21 DIAGNOSIS — I672 Cerebral atherosclerosis: Secondary | ICD-10-CM | POA: Diagnosis not present

## 2019-12-21 DIAGNOSIS — E785 Hyperlipidemia, unspecified: Secondary | ICD-10-CM | POA: Diagnosis present

## 2019-12-21 DIAGNOSIS — I10 Essential (primary) hypertension: Secondary | ICD-10-CM | POA: Diagnosis present

## 2019-12-21 DIAGNOSIS — Z20822 Contact with and (suspected) exposure to covid-19: Secondary | ICD-10-CM | POA: Diagnosis present

## 2019-12-21 DIAGNOSIS — I6302 Cerebral infarction due to thrombosis of basilar artery: Secondary | ICD-10-CM | POA: Diagnosis not present

## 2019-12-21 DIAGNOSIS — R4781 Slurred speech: Secondary | ICD-10-CM | POA: Diagnosis present

## 2019-12-21 DIAGNOSIS — I6602 Occlusion and stenosis of left middle cerebral artery: Secondary | ICD-10-CM | POA: Diagnosis not present

## 2019-12-21 DIAGNOSIS — D509 Iron deficiency anemia, unspecified: Secondary | ICD-10-CM | POA: Diagnosis present

## 2019-12-21 DIAGNOSIS — I6389 Other cerebral infarction: Secondary | ICD-10-CM | POA: Diagnosis present

## 2019-12-21 DIAGNOSIS — I6611 Occlusion and stenosis of right anterior cerebral artery: Secondary | ICD-10-CM | POA: Diagnosis not present

## 2019-12-21 DIAGNOSIS — G8314 Monoplegia of lower limb affecting left nondominant side: Secondary | ICD-10-CM | POA: Diagnosis present

## 2019-12-21 DIAGNOSIS — Z820 Family history of epilepsy and other diseases of the nervous system: Secondary | ICD-10-CM | POA: Diagnosis not present

## 2019-12-21 DIAGNOSIS — I340 Nonrheumatic mitral (valve) insufficiency: Secondary | ICD-10-CM | POA: Diagnosis not present

## 2019-12-21 DIAGNOSIS — F329 Major depressive disorder, single episode, unspecified: Secondary | ICD-10-CM | POA: Diagnosis present

## 2019-12-21 DIAGNOSIS — I493 Ventricular premature depolarization: Secondary | ICD-10-CM | POA: Diagnosis present

## 2019-12-21 DIAGNOSIS — Z87891 Personal history of nicotine dependence: Secondary | ICD-10-CM | POA: Diagnosis not present

## 2019-12-21 DIAGNOSIS — R29701 NIHSS score 1: Secondary | ICD-10-CM | POA: Diagnosis present

## 2019-12-21 LAB — COMPREHENSIVE METABOLIC PANEL
ALT: 13 U/L (ref 0–44)
AST: 15 U/L (ref 15–41)
Albumin: 3.6 g/dL (ref 3.5–5.0)
Alkaline Phosphatase: 57 U/L (ref 38–126)
Anion gap: 9 (ref 5–15)
BUN: 10 mg/dL (ref 8–23)
CO2: 24 mmol/L (ref 22–32)
Calcium: 9.2 mg/dL (ref 8.9–10.3)
Chloride: 104 mmol/L (ref 98–111)
Creatinine, Ser: 1.07 mg/dL (ref 0.61–1.24)
GFR calc Af Amer: >60 mL/min (ref >60)
GFR calc non Af Amer: >60 mL/min (ref >60)
Glucose, Bld: 102 mg/dL — ABNORMAL HIGH (ref 70–99)
Potassium: 3.8 mmol/L (ref 3.5–5.1)
Sodium: 137 mmol/L (ref 135–145)
Total Bilirubin: 0.6 mg/dL (ref 0.3–1.2)
Total Protein: 5.9 g/dL — ABNORMAL LOW (ref 6.5–8.1)

## 2019-12-21 LAB — CBC
HCT: 32 % — ABNORMAL LOW (ref 39.0–52.0)
Hemoglobin: 10.2 g/dL — ABNORMAL LOW (ref 13.0–17.0)
MCH: 28.2 pg (ref 26.0–34.0)
MCHC: 31.9 g/dL (ref 30.0–36.0)
MCV: 88.4 fL (ref 80.0–100.0)
Platelets: 181 10*3/uL (ref 150–400)
RBC: 3.62 MIL/uL — ABNORMAL LOW (ref 4.22–5.81)
RDW: 11.8 % (ref 11.5–15.5)
WBC: 4.8 10*3/uL (ref 4.0–10.5)
nRBC: 0 % (ref 0.0–0.2)

## 2019-12-21 LAB — SARS CORONAVIRUS 2 (TAT 6-24 HRS): SARS Coronavirus 2: NEGATIVE

## 2019-12-21 LAB — LIPID PANEL
Cholesterol: 181 mg/dL (ref 0–200)
HDL: 55 mg/dL (ref >40)
LDL Cholesterol: 117 mg/dL — ABNORMAL HIGH (ref 0–99)
Total CHOL/HDL Ratio: 3.3 RATIO
Triglycerides: 45 mg/dL (ref <150)
VLDL: 9 mg/dL (ref 0–40)

## 2019-12-21 LAB — ECHOCARDIOGRAM COMPLETE
Height: 67 in
Weight: 2416.24 oz

## 2019-12-21 LAB — HEMOGLOBIN A1C
Hgb A1c MFr Bld: 4.7 % — ABNORMAL LOW (ref 4.8–5.6)
Mean Plasma Glucose: 88.19 mg/dL

## 2019-12-21 MED ORDER — ATORVASTATIN CALCIUM 40 MG PO TABS: 40.0000 mg | ORAL_TABLET | Freq: Every day | ORAL | 0 refills | Status: DC

## 2019-12-21 MED ORDER — CLOPIDOGREL BISULFATE 75 MG PO TABS: 75.0000 mg | ORAL_TABLET | Freq: Every day | ORAL | 0 refills | Status: AC

## 2019-12-21 MED ORDER — ASPIRIN 81 MG PO TBEC: 81.0000 mg | DELAYED_RELEASE_TABLET | Freq: Every day | ORAL | Status: DC

## 2019-12-21 MED ORDER — LABETALOL HCL 5 MG/ML IV SOLN: 10.0000 mg | INTRAVENOUS | Status: DC | PRN

## 2019-12-21 MED ORDER — ATORVASTATIN CALCIUM 40 MG PO TABS: 40.0000 mg | ORAL_TABLET | Freq: Every day | ORAL | Status: DC

## 2019-12-21 NOTE — Evaluation (Signed)
Physical Therapy Evaluation Patient Details Name: Devin Valdez MRN: [id number] DOB: 08-26-1941 Today's Date: 12/21/2019   History of Present Illness  Pt is a 79 y.o. M with significant PMH of HTN, cervical stenosis who presents with slurred speech and left sided weaknesss. 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   Clinical Impression  Patient evaluated by Physical Therapy with no further acute PT needs identified. Pt denies residual deficits. Ambulating hallway distances and negotiating steps without physical assist. Scoring 21/24 on the Dynamic Gait Index, indicating he is not at risk for falls. Education provided regarding BEFAST stroke symptoms and general exercise recommendations. All education has been completed and the patient has no further questions. No follow-up Physical Therapy or equipment needs. PT is signing off. Thank you for this referral.     Follow Up Recommendations No PT follow up    Equipment Recommendations  None recommended by PT    Recommendations for Other Services       Precautions / Restrictions Precautions Precautions: None Restrictions Weight Bearing Restrictions: No      Mobility  Bed Mobility Overal bed mobility: Independent                Transfers Overall transfer level: Independent Equipment used: None                Ambulation/Gait Ambulation/Gait assistance: Independent Gait Distance (Feet): 300 Feet Assistive device: None Gait Pattern/deviations: WFL(Within Functional Limits)     General Gait Details: No gross unsteadiness, steady pace  Stairs Stairs: Yes Stairs assistance: Independent Stair Management: No rails Number of Stairs: 5 General stair comments: Step over step pattern  Wheelchair Mobility    Modified Rankin (Stroke Patients Only) Modified Rankin (Stroke Patients Only) Pre-Morbid Rankin Score: No symptoms Modified Rankin: No symptoms     Balance Overall balance  assessment: No apparent balance deficits (not formally assessed)                               Standardized Balance Assessment Standardized Balance Assessment : Dynamic Gait Index   Dynamic Gait Index Level Surface: Normal Change in Gait Speed: Normal Gait with Horizontal Head Turns: Mild Impairment Gait with Vertical Head Turns: Mild Impairment Gait and Pivot Turn: Normal Step Over Obstacle: Mild Impairment Step Around Obstacles: Normal Steps: Normal Total Score: 21       Pertinent Vitals/Pain Pain Assessment: No/denies pain    Home Living Family/patient expects to be discharged to:: Private residence Living Arrangements: Spouse/significant other;Children(son ) Available Help at Discharge: Family Type of Home: House Home Access: Stairs to enter Entrance Stairs-Rails: Psychiatric nurse of Steps: 3 Home Layout: Two level Home Equipment: Civil engineer, contracting      Prior Function Level of Independence: Independent         Comments: Retired; worked for [employer] Hand: Left    Extremity/Trunk Assessment   Upper Extremity Assessment Upper Extremity Assessment: Defer to OT evaluation    Lower Extremity Assessment Lower Extremity Assessment: RLE deficits/detail;LLE deficits/detail RLE Deficits / Details: Strength 5/5 LLE Deficits / Details: Strength 5/5    Cervical / Trunk Assessment Cervical / Trunk Assessment: Other exceptions Cervical / Trunk Exceptions: forward head posture  Communication   Communication: No difficulties  Cognition Arousal/Alertness: Awake/alert Behavior During Therapy: WFL for tasks assessed/performed Overall Cognitive Status: Within Functional Limits for tasks assessed  General Comments      Exercises     Assessment/Plan    PT Assessment Patent does not need any further PT services  PT Problem List Decreased  balance       PT Treatment Interventions      PT Goals (Current goals can be found in the Care Plan section)  Acute Rehab PT Goals Patient Stated Goal: "not have another stroke." PT Goal Formulation: All assessment and education complete, DC therapy    Frequency     Barriers to discharge        Co-evaluation               AM-PAC PT "6 Clicks" Mobility  Outcome Measure Help needed turning from your back to your side while in a flat bed without using bedrails?: None Help needed moving from lying on your back to sitting on the side of a flat bed without using bedrails?: None Help needed moving to and from a bed to a chair (including a wheelchair)?: None Help needed standing up from a chair using your arms (e.g., wheelchair or bedside chair)?: None Help needed to walk in hospital room?: None Help needed climbing 3-5 steps with a railing? : None 6 Click Score: 24    End of Session   Activity Tolerance: Patient tolerated treatment well Patient left: in bed;with call bell/phone within reach Nurse Communication: Mobility status PT Visit Diagnosis: Other symptoms and signs involving the nervous system (R29.898)    Time: 2841-3244 PT Time Calculation (min) (ACUTE ONLY): 23 min   Charges:   PT Evaluation $PT Eval Low Complexity: 1 Low PT Treatments $Therapeutic Activity: 8-22 mins         Wyona Almas, PT, DPT Acute Rehabilitation Services Pager [phone] Office [phone]    Deno Etienne 12/21/2019, 9:56 AM

## 2019-12-21 NOTE — Evaluation (Signed)
Speech Language Pathology Evaluation Patient Details Name: Devin Valdez MRN: [id number] DOB: 03-Mar-1941 Today's Date: 12/21/2019 Time: 9518-8416 SLP Time Calculation (min) (ACUTE ONLY): 44 min  Problem List:  Patient Active Problem List   Diagnosis Date Noted  . CVA (cerebral vascular accident) ([place]) 12/20/2019  . 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[place])   . PVC (premature ventricular contraction)   . Cervical stenosis of spinal canal   . Failure to thrive in adult 05/26/2017  . Failure to thrive (0-17) 05/25/2017  . Abdominal pulsatile mass 05/25/2017  . Anemia 05/25/2017  . Essential hypertension 05/25/2017  . Gait instability 05/20/2017  . Frequent falls 05/20/2017  . Depression 05/20/2017   Past Medical History:  Past Medical History:  Diagnosis Date  . Anemia   . Anxiety   . 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 Depression   . Falls   . Hallucinations   . Hyperlipidemia   . Hypertension    Past Surgical History:  Past Surgical History:  Procedure Laterality Date  . EYE SURGERY    . HERNIA REPAIR    . POSTERIOR CERVICAL LAMINECTOMY     with fusion C3-C7 10/19- Dr. Brigitte Pulse at [facility]   HPI:  pt adm with dysarthria and left facial droop. Found to have an acute CVA of left paramedian pons. Past medical history significant for hypertension, hyperlipidemia, cervical stenosis presents to the emergency department with slurred speech and left-sided weakness that began 2 days ago. per MD note. Pt went to his PCP who recommended he take ASA.  Pt is LEFT handed and has been married for 51 years.   Assessment / Plan / Recommendation Clinical Impression  Patient presents with minimal dysarthria due this pons cva resulting in imprecise articulation and mild decreased speech intelligibilty due to decreased lingual coordination.  He modulates his speech independently when requested to repeat words not understood by listener.  MOCA 7.2 given to pt with score of 23/30  with strengths in language .  Memory subtest was challenging for pt - he recalled 1/5 words independently and 4/5 with cues - SLP educated him to findings and reviewed importance of attention for memory.  Also provided pt with written memory compensation strategies.  Pt resides with his wife and reports his wife manages the finances on her electronic device.  They reportedly word together on all other duties *wife cooks only*.  No SLP follow up indicated as pt is intelligible and educated to compensations.  In addition, pt frequently completes [employer] and complex puzzles in the newspaper.  Thanks for the referral of this most delightul man.    SLP Assessment  SLP Recommendation/Assessment: Patient does not need any further Speech Lanaguage Pathology Services SLP Visit Diagnosis: Dysarthria and anarthria (R47.1);Cognitive communication deficit (R41.841)    Follow Up Recommendations  None    Frequency and Duration           SLP Evaluation Cognition  Arousal/Alertness: Awake/alert Orientation Level: Oriented X4 Attention: Sustained;Selective Sustained Attention: Appears intact Selective Attention: Appears intact Memory: Impaired Memory Impairment: Retrieval deficit(recalled 1/5 words I, 4/5 with cues) Awareness: Appears intact Problem Solving: Appears intact Safety/Judgment: Appears intact       Comprehension  Auditory Comprehension Overall Auditory Comprehension: Appears within functional limits for tasks assessed Yes/No Questions: Not tested Commands: Within Functional Limits Conversation: Complex Visual Recognition/Discrimination Discrimination: Not tested Reading Comprehension Reading Status: Not tested    Expression Expression Primary Mode of Expression: Verbal Verbal Expression Overall Verbal Expression:  Appears within functional limits for tasks assessed Initiation: No impairment Repetition: No impairment Naming: No impairment Pragmatics: (pt is verbose) Interfering  Components: Speech intelligibility Written Expression Dominant Hand: Left Written Expression: (able to draw clock)   Oral / Motor  Oral Motor/Sensory Function Overall Oral Motor/Sensory Function: Mild impairment Facial ROM: Suspected CN VII (facial) dysfunction Facial Symmetry: Suspected CN VII (facial) dysfunction Facial Strength: Within Functional Limits Facial Sensation: Within Functional Limits Lingual ROM: Within Functional Limits Lingual Symmetry: Within Functional Limits Lingual Strength: Suspected CN XII (hypoglossal) dysfunction Velum: Within Functional Limits Mandible: Within Functional Limits Motor Speech Overall Motor Speech: Impaired Respiration: Within functional limits Phonation: Normal Resonance: Within functional limits Articulation: Impaired Level of Impairment: (multisyllabic and complex clusters) Intelligibility: Intelligible Motor Planning: Witnin functional limits Motor Speech Errors: Not applicable Effective Techniques: Slow rate   GO                    [name] 12/21/2019, 8:41 AM Kathleen Lime, MS [place] Office [phone]

## 2019-12-21 NOTE — Progress Notes (Signed)
STROKE TEAM PROGRESS NOTE   INTERVAL HISTORY His RN is at the bedside.  Pt lying in bed, feeling much better. He reported left sided weakness for 2 days, no significant left sided weakness on exam. MRI showed left pontine infarct but no right pontine involvement. CUS and TTE unremarkable. PT no recommendations.  OBJECTIVE Vitals:   12/21/19 0000 12/21/19 0200 12/21/19 0400 12/21/19 0817  BP: (!) 175/89 (!) 175/72 (!) 157/71 (!) 177/78  Pulse: (!) 58 (!) 57 63 75  Resp: 18 18 16 20   Temp: 98.2 F (36.8 C) 98 F (36.7 C) 98 F (36.7 C) 97.6 F (36.4 C)  TempSrc: Oral Oral Oral Axillary  SpO2: 100% 99% 100% 99%  Weight: 68.5 kg     Height: 5\' 7"  (1.702 m)       CBC:  Recent Labs  Lab 12/19/19 1508 12/19/19 1508 12/20/19 1654 12/20/19 1654 12/20/19 1705 12/21/19 0224  WBC 4.5   < > 4.4  --   --  4.8  NEUTROABS 2,844  --  2.4  --   --   --   HGB 11.2*   < > 11.1*   < > 10.9* 10.2*  HCT 34.4*   < > 35.5*   < > 32.0* 32.0*  MCV 87.5   < > 89.9  --   --  88.4  PLT 198   < > 192  --   --  181   < > = values in this interval not displayed.    Basic Metabolic Panel:  Recent Labs  Lab 12/20/19 1654 12/20/19 1654 12/20/19 1705 12/21/19 0224  NA 136   < > 137 137  K 3.9   < > 3.9 3.8  CL 101   < > 103 104  CO2 24  --   --  24  GLUCOSE 117*   < > 110* 102*  BUN 13   < > 13 10  CREATININE 1.13   < > 1.10 1.07  CALCIUM 9.6  --   --  9.2   < > = values in this interval not displayed.    Lipid Panel:     Component Value Date/Time   CHOL 181 12/21/2019 0224   TRIG 45 12/21/2019 0224   HDL 55 12/21/2019 0224   CHOLHDL 3.3 12/21/2019 0224   VLDL 9 12/21/2019 0224   LDLCALC 117 (H) 12/21/2019 0224   LDLCALC 129 (H) 05/09/2019 0838   HgbA1c:  Lab Results  Component Value Date   HGBA1C 4.7 (L) 12/21/2019   Urine Drug Screen:     Component Value Date/Time   LABOPIA NONE DETECTED 12/20/2019 1855   COCAINSCRNUR NONE DETECTED 12/20/2019 1855   LABBENZ NONE DETECTED  12/20/2019 1855   AMPHETMU NONE DETECTED 12/20/2019 1855   THCU NONE DETECTED 12/20/2019 1855   LABBARB NONE DETECTED 12/20/2019 1855    Alcohol Level     Component Value Date/Time   ETH <10 12/20/2019 1654    IMAGING  CT HEAD WO CONTRAST 12/20/2019 IMPRESSION:  1. No acute intracranial pathology.  2. 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   MR ANGIO HEAD WO CONTRAST 12/21/2019 IMPRESSION:  Both vertebral arteries widely patent to the basilar. Mild atherosclerotic irregularity of the basilar artery without flow limiting stenosis. Flow visible in both posteroinferior cerebellar arteries, a left anterior inferior cerebellar artery, both superior cerebellar arteries and both posterior cerebral arteries. 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  This appears similar in 2018, though not as well seen because of motion degradation. I doubt the clinical significance of this finding. Ordinary atherosclerotic changes of the intracranial anterior circulation. 30% stenosis of the left M1 segment. Mild atherosclerotic narrowing of the right A1 segment.   MR BRAIN WO CONTRAST 12/20/2019 IMPRESSION:  1. 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 No mass effect or hemorrhage.  2. Otherwise stable noncontrast MRI appearance of the brain since 2018. Mild to moderate for age white matter signal changes most commonly due to chronic small vessel disease.   ECG - SR rate 67 BPM. (See cardiology reading for complete details)   PHYSICAL EXAM  Temp:  [97.6 F (36.4 C)-98.7 F (37.1 C)] 98.7 F (37.1 C) (01/30 1100) Pulse Rate:  [57-75] 63 (01/30 1100) Resp:  [11-20] 18 (01/30 1100) BP: (157-216)/(64-89) 164/73 (01/30 1100) SpO2:  [98 %-100 %] 100 % (01/30 1100) Weight:  [68.5 kg] 68.5 kg (01/30 0000)  General - Well nourished, well developed, in no apparent  distress.  Ophthalmologic - fundi not visualized due to noncooperation.  Cardiovascular - Regular rhythm and rate.  Mental Status -  Level of arousal and orientation to time, place, and person were intact. Language including expression, naming, repetition, comprehension was assessed and found intact. Fund of Knowledge was assessed and was intact.  Cranial Nerves II - XII - II - Visual field intact OU. III, IV, VI - Extraocular movements intact. V - Facial sensation intact bilaterally. VII - Facial movement intact bilaterally. VIII - Hearing & vestibular intact bilaterally. X - Palate elevates symmetrically. XI - Chin turning & shoulder shrug intact bilaterally. XII - Tongue protrusion intact.  Motor Strength - The patient's strength was normal in all extremities and pronator drift was absent.  Bulk was normal and fasciculations were absent.   Motor Tone - Muscle tone was assessed at the neck and appendages and was normal.  Reflexes - The patient's reflexes were symmetrical in all extremities and he had no pathological reflexes.  Sensory - Light touch, temperature/pinprick were assessed and were symmetrical.    Coordination - The patient had normal movements in the hands with no ataxia or dysmetria.  Tremor was absent.  Gait and Station - deferred.   ASSESSMENT/PLAN [name] is a 79 y.o. male with history of hypertension, anemia, hyperlipidemia, cervical stenosis presents to the emergency department with slurred speech and left-sided weakness that began 2 days ago.  He did not receive IV t-PA due to late presentation (>4.5 hours from time of onset).  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 Mild to moderate for age white matter signal changes most commonly due to chronic small vessel disease.  MRA head - no significant stenosis  Carotid Doppler unremarkable  2D Echo EF 55-60%  Sars Corona Virus 2 - negative  LDL - 117  HgbA1c - 4.7  UDS - negative  VTE prophylaxis - Lovenox  No antithrombotic prior to admission, now on aspirin 81 mg daily and clopidogrel 75 mg daily. Continue DAPT for 3 weeks and then ASA alone.  Patient counseled to be compliant with his antithrombotic medications  Ongoing aggressive stroke risk factor  management  Therapy recommendations:  none  Disposition:  Pending  Hypertension  Home BP meds: Cozaar  Current BP meds: Losartin  Stable . Permissive hypertension (OK if < 220/120) but gradually normalize in 3-5 days . Long-term BP goal normotensive  Hyperlipidemia  Home Lipid lowering medication: none   LDL 117, goal < 70  Current lipid lowering medication: Lipitor 40 mg daily   Continue statin at discharge  Other Stroke Risk Factors  Advanced age  Former cigarette smoker - quit 30 years ago  Other Active Problems  Orthostatic hypotension in 2018 - followed with Dr. Leonie Man.   Hospital day # 0  Neurology will sign off. Please call with questions. Pt will follow up with stroke clinic Dr. Leonie Man at [facility] in about 4 weeks. Thanks for the consult.  Rosalin Hawking, MD PhD Stroke Neurology 12/21/2019 1:43 PM    To contact Stroke Continuity provider, please refer to [email or web address] After hours, contact General Neurology

## 2019-12-21 NOTE — Progress Notes (Signed)
VASCULAR LAB PRELIMINARY  PRELIMINARY  PRELIMINARY  PRELIMINARY  Carotid duplex completed.    Preliminary report:  See CV proc for preliminary results.   Rahkim Rabalais, RVT 12/21/2019, 12:16 PM

## 2019-12-21 NOTE — Evaluation (Signed)
Occupational Therapy Evaluation Patient Details Name: Devin Valdez MRN: [id number] DOB: [birth year] Today's Date: 12/21/2019    History of Present Illness Devin is a 79 y.o. M with significant PMH of HTN, cervical stenosis who presents with slurred speech and left sided weaknesss. 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    Clinical Impression   Devin Valdez with family, retired and independent with ADL and mobility. Devin currently, requiring no assist with ADL and mobility in room. Devin stood at sink for ADL tasks and able to multi-task and talk to OTR while performing tasks. Devin with good balance when picking up items from floor. No focal deficits identified at this time. Devin does not require continued OT skilled services. OT signing off.      Follow Up Recommendations  No OT follow up    Equipment Recommendations  None recommended by OT    Recommendations for Other Services       Precautions / Restrictions Precautions Precautions: None Restrictions Weight Bearing Restrictions: No      Mobility Bed Mobility Overal bed mobility: Independent                Transfers Overall transfer level: Independent Equipment used: None                  Balance Overall balance assessment: No apparent balance deficits (not formally assessed)                                         ADL either performed or assessed with clinical judgement   ADL Overall ADL's : Modified independent                                       General ADL Comments: Devin donning socks in sitting and performing ADL standing at sink with no physical assist required.      Vision Baseline Vision/History: Wears glasses Wears Glasses: Reading only Patient Visual Report: No change from baseline Vision Assessment?: No apparent visual deficits     Perception     Praxis      Pertinent Vitals/Pain Pain Assessment: No/denies pain     Hand  Dominance Left   Extremity/Trunk Assessment Upper Extremity Assessment Upper Extremity Assessment: RUE deficits/detail;LUE deficits/detail RUE Deficits / Details: strength 5/5 mm grade LUE Deficits / Details: strength 5/5 mm grade   Lower Extremity Assessment Lower Extremity Assessment: Defer to Devin evaluation   Cervical / Trunk Assessment Cervical / Trunk Assessment: Other exceptions Cervical / Trunk Exceptions: forward head posture   Communication Communication Communication: No difficulties   Cognition Arousal/Alertness: Awake/alert Behavior During Therapy: WFL for tasks assessed/performed Overall Cognitive Status: Within Functional Limits for tasks assessed                                     General Comments       Exercises     Shoulder Instructions      Home Living Family/patient expects to be discharged to:: Private residence Living Arrangements: Spouse/significant other;Children(son) Available Help at Discharge: Family Type of Home: House Home Access: Stairs to enter Technical brewer of Steps: 3 Entrance Stairs-Rails: Right;Left Home Layout: Two level Alternate Level Stairs-Number of Steps: 7  Bathroom Shower/Tub: Chief Strategy Officer: Civil engineer, contracting          Prior Functioning/Environment Level of Independence: Independent        Comments: Retired; worked for [employer]        OT Problem List:        OT Treatment/Interventions:      OT Goals(Current goals can be found in the care plan section) Acute Rehab OT Goals Patient Stated Goal: "not have another stroke." OT Goal Formulation: With patient  OT Frequency:     Barriers to D/C:            Co-evaluation              AM-PAC OT "6 Clicks" Daily Activity     Outcome Measure Help from another person eating meals?: None Help from another person taking care of personal grooming?: None Help from another person toileting, which  includes using toliet, bedpan, or urinal?: None Help from another person bathing (including washing, rinsing, drying)?: None Help from another person to put on and taking off regular upper body clothing?: None Help from another person to put on and taking off regular lower body clothing?: None 6 Click Score: 24   End of Session Nurse Communication: Mobility status  Activity Tolerance: Patient tolerated treatment well Patient left: in chair;with call bell/phone within reach  OT Visit Diagnosis: Unsteadiness on feet (R26.81)                Time: 7371-0626 OT Time Calculation (min): 17 min Charges:  OT General Charges $OT Visit: 1 Visit OT Evaluation $OT Eval Moderate Complexity: [place] C OTR/L Acute Rehabilitation Services Pager: [phone] Office: [phone]   Zakara Parkey C 12/21/2019, 4:27 PM

## 2019-12-21 NOTE — Progress Notes (Signed)
  Echocardiogram 2D Echocardiogram has been performed.  Devin Valdez 12/21/2019, 12:03 PM

## 2019-12-21 NOTE — Discharge Summary (Signed)
Physician Discharge Summary  Devin Valdez ZOX:[id number] DOB: 27-Mar-1941 DOA: 12/20/2019  PCP: [name], MD  Admit date: 12/20/2019 Discharge date: 12/21/2019  Admitted From: Home Discharge disposition: Home   Recommendations for Outpatient Follow-Up:   Aspirin plus Plavix for 3 weeks then aspirin alone Patient was started on a statin: Please follow-up on LFTs and FLP  Discharge Diagnosis:   Active Problems:   Depression   Anemia   Essential hypertension   CVA (cerebral vascular accident) [facility])    Discharge Condition: Improved.  Diet recommendation: Low sodium, heart healthy  Wound care: None.  Code status: Full.   History of Present Illness:   Devin Valdez is a 79 y.o. male with medical history significant of 79 years old male with past medical history of hypertension, hyperlipidemia, anemia, cervical stenosis came with a chief complaint weakness in his legs especially on the left side and slurred speech. He saw his primary doctor yesterday complaining of some general weakness fatigue and some slurred speech.  Primary doctor aware he may have a TIA and recommended MRI as an outpatient.  Today apparently he had the same episode came to emergency room.   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 Continue DAPT for 3 weeks and then ASA alone.  PT/OT: no follow up  Hypertension  Permissive hypertension (OK if < 220/120) but gradually normalize in 3-5 days  Hyperlipidemia -LDL 117, goal < 70 - Lipitor 40 mg daily   In light of brainstem stroke found on MRI,  patient was changed to inpatient as I anticipated that he would be here for at least another night.  Surprisingly, patient's hospital course was faster than anticipated and he worked with PT and did remarkably well for his situation and was ready for discharge prior to the second midnight  Medical Consultants:   Neurology   Discharge Exam:   Vitals:   12/21/19 0817 12/21/19 1100  BP: (!) 177/78 (!) 164/73  Pulse: 75 63  Resp: 20 18  Temp: 97.6 F (36.4 C) 98.7 F (37.1 C)  SpO2: 99% 100%   Vitals:   12/21/19 0200 12/21/19 0400 12/21/19 0817 12/21/19 1100  BP: (!) 175/72 (!) 157/71 (!) 177/78 (!) 164/73  Pulse: (!) 57 63 75 63  Resp: 18 16 20 18   Temp: 98 F (36.7 C) 98 F (36.7 C) 97.6 F (36.4 C) 98.7 F (37.1 C)  TempSrc: Oral Oral Axillary Oral  SpO2: 99% 100% 99% 100%  Weight:      Height:        General exam: Appears calm and comfortable. Up in chair  The results of significant diagnostics from this hospitalization (including imaging, microbiology, ancillary and laboratory) are listed below for reference.     Procedures and Diagnostic Studies:   CT HEAD WO CONTRAST  Result Date: 12/20/2019 CLINICAL DATA:  Left leg weakness. EXAM: CT HEAD WITHOUT CONTRAST TECHNIQUE: Contiguous axial images were obtained from the base of the skull through the vertex without intravenous contrast. COMPARISON:  May 20, 2017 FINDINGS: Brain: No evidence of  acute infarction, hemorrhage, hydrocephalus, extra-axial collection or mass lesion/mass effect. Vascular: No hyperdense vessel or unexpected calcification. Skull: Normal. Negative for fracture or focal lesion. Sinuses/Orbits: There is a 1.6 cm x 1.1 cm right maxillary sinus polyp versus mucous retention cyst. Other: None. IMPRESSION: 1. No acute intracranial pathology. 2. 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 Electronically Signed   By: Virgina Norfolk M.D.   On: 12/20/2019 19:00   MR ANGIO HEAD WO  CONTRAST  Result Date: 12/21/2019 CLINICAL DATA:  Leg weakness.  Acute infarction of the left pons. EXAM: MRA HEAD WITHOUT CONTRAST TECHNIQUE: Angiographic images of the Circle of Willis were obtained using MRA technique without intravenous contrast. COMPARISON:  MRI yesterday.  MR angiography 05/21/2017. FINDINGS: Both internal carotid arteries are widely patent through the skull base and siphon regions. The anterior and middle cerebral vessels are patent. On the right, there is a very early origin of the anterior temporal branch. Mild atherosclerotic narrowing at the MCA bifurcation. Right A1 segment shows atherosclerotic irregularity and narrowing. On the left, there is a 30% stenosis of the proximal M1 segment. No stenosis seen distal to that. Left anterior cerebral artery is widely patent. Both vertebral arteries are widely patent. Both posteroinferior cerebellar arteries show flow. The basilar artery shows mild atherosclerotic irregularity but no stenosis. Left anterior inferior cerebellar artery shows flow. Flow is present in both superior cerebellar arteries. Both posterior cerebral arteries are patent. 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 This was present previously but better seen today because of less motion degradation. This is of doubtful clinical significance. IMPRESSION: Both vertebral arteries widely patent to the basilar. Mild atherosclerotic irregularity of the basilar artery without flow limiting stenosis. Flow visible in both posteroinferior cerebellar arteries, a left anterior inferior cerebellar artery, both superior cerebellar arteries and both posterior cerebral arteries. 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 This appears similar in 2018, though not as well seen because of motion degradation. I  doubt the clinical significance of this finding. Ordinary atherosclerotic changes of the intracranial anterior circulation. 30% stenosis of the left M1 segment. Mild atherosclerotic narrowing of the right A1 segment. Electronically Signed   By: Nelson Chimes M.D.   On: 12/21/2019 07:34   MR BRAIN WO CONTRAST  Result Date: 12/20/2019 CLINICAL DATA:  79 year old male with leg weakness. EXAM: MRI HEAD WITHOUT CONTRAST TECHNIQUE: Multiplanar, multiecho pulse sequences of the brain and surrounding structures were obtained without intravenous contrast. COMPARISON:  Head CT earlier tonight. Brain MRI 05/21/2017. FINDINGS: Brain: Patchy and linear restricted diffusion in the left paracentral pons (series 5, image 65). Faint associated T2 and FLAIR hyperintensity. No mass effect or evidence of hemorrhagic transformation. No other restricted diffusion identified. Nomidline shift, mass effect, evidence of mass lesion, ventriculomegaly, extra-axial collection or acute intracranial hemorrhage. Cervicomedullary junction and pituitary are within normal limits. Patchy bilateral cerebral white matter T2 and FLAIR hyperintensity appears stable since 2018. No cortical encephalomalacia or chronic cerebral blood products identified. Deep gray nuclei and cerebellum remain within normal limits. Vascular: Major intracranial vascular flow voids are stable since 2018. Skull and upper cervical spine: Normal for age visible cervical spine. Normal bone marrow signal. Sinuses/Orbits: Stable, negative orbits. Mild right ethmoid sinus mucosal thickening today. 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 Other: Mastoids remain clear. Visible internal auditory structures appear normal.  Scalp and face soft tissues appear negative. IMPRESSION: 1. 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 No mass effect or hemorrhage. 2. Otherwise stable noncontrast MRI appearance of the brain since 2018.  Mild to moderate for age white matter signal changes most commonly due to chronic small vessel disease. Electronically Signed   By: Genevie Ann M.D.   On: 12/20/2019 21:13   ECHOCARDIOGRAM COMPLETE  Result Date: 12/21/2019   ECHOCARDIOGRAM REPORT   Patient Name:   MABEL UNREIN Date of Exam: 12/21/2019 Medical Rec #:  [id number]          Height:       67.0 in Accession #:    [id number]         Weight:       151.0 lb Date of Birth:  04/28/1941           BSA:          1.79 m Patient Age:    78 years           BP:           164/73 mmHg Patient Gender: M                  HR:           63 bpm. Exam Location:  Inpatient Procedure: 2D Echo Indications:    stroke 434.91  History:        Patient has prior history of Echocardiogram examinations, most                 recent 05/21/2017. Risk Factors:Hypertension, Dyslipidemia and                 Former Smoker.  Sonographer:    Jannett Celestine RDCS (AE) Referring Phys: [id number] Ramona  1. Left ventricular ejection fraction, by visual estimation, is 55 to 60%. The left ventricle has normal function. There is no left ventricular hypertrophy.  2. Left ventricular diastolic parameters are consistent with Grade I diastolic dysfunction (impaired relaxation).  3. The left ventricle has no regional wall motion abnormalities.  4. Global right ventricle has normal systolic function.The right ventricular size is normal. No increase in right ventricular wall thickness.  5. Left atrial size was normal.  6. Right atrial size was normal.  7. The mitral valve is normal in structure. Mild mitral valve regurgitation.  8. The tricuspid valve is normal in structure.  9. The tricuspid valve is normal in structure. Tricuspid valve regurgitation is trivial. 10. The aortic valve is tricuspid. Aortic valve regurgitation is not visualized. Mild aortic valve sclerosis without stenosis. 11. The pulmonic valve was grossly normal. Pulmonic valve regurgitation is not visualized. FINDINGS   Left Ventricle: Left ventricular ejection fraction, by visual estimation, is 55 to 60%. The left ventricle has normal function. The left ventricle has no regional wall motion abnormalities. The left ventricular internal cavity size was the left ventricle is normal in size. There is no left ventricular hypertrophy. Left ventricular diastolic parameters are consistent with Grade I diastolic dysfunction (impaired relaxation). Right Ventricle: The right ventricular size is normal. No increase in right ventricular wall thickness. Global RV systolic function is has normal systolic function. Left Atrium: Left atrial size was normal in size. Right Atrium: Right atrial size was normal in size Pericardium: There is no evidence of pericardial effusion. Mitral Valve: The mitral valve is normal in structure. Mild mitral valve regurgitation. Tricuspid Valve: The tricuspid  valve is normal in structure. Tricuspid valve regurgitation is trivial. Aortic Valve: The aortic valve is tricuspid. Aortic valve regurgitation is not visualized. Mild aortic valve sclerosis is present, with no evidence of aortic valve stenosis. Pulmonic Valve: The pulmonic valve was grossly normal. Pulmonic valve regurgitation is not visualized. Pulmonic regurgitation is not visualized. Aorta: The aortic root is normal in size and structure. IAS/Shunts: No atrial level shunt detected by color flow Doppler.  LEFT VENTRICLE PLAX 2D LVIDd:         4.30 cm  Diastology LVIDs:         2.90 cm  LV e' lateral:   11.70 cm/s LV PW:         0.90 cm  LV E/e' lateral: 7.5 LV IVS:        1.00 cm  LV e' medial:    8.38 cm/s LVOT diam:     1.90 cm  LV E/e' medial:  10.4 LV SV:         51 ml LV SV Index:   28.17 LVOT Area:     2.84 cm  RIGHT VENTRICLE RV S prime:     11.40 cm/s TAPSE (M-mode): 1.8 cm LEFT ATRIUM             Index       RIGHT ATRIUM           Index LA diam:        3.20 cm 1.78 cm/m  RA Area:     11.10 cm LA Vol (A2C):   28.3 ml 15.77 ml/m RA Volume:   23.80  ml  13.26 ml/m LA Vol (A4C):   45.3 ml 25.24 ml/m LA Biplane Vol: 38.9 ml 21.68 ml/m  AORTIC VALVE LVOT Vmax:   106.00 cm/s LVOT Vmean:  76.300 cm/s LVOT VTI:    0.273 m  AORTA Ao Root diam: 3.20 cm MITRAL VALVE MV Area (PHT): 2.99 cm             SHUNTS MV PHT:        73.66 msec           Systemic VTI:  0.27 m MV Decel Time: 254 msec             Systemic Diam: 1.90 cm MV E velocity: 87.40 cm/s 103 cm/s MV A velocity: 83.10 cm/s 70.3 cm/s MV E/A ratio:  1.05       1.5  Dorris Carnes MD Electronically signed by Dorris Carnes MD Signature Date/Time: 12/21/2019/1:13:21 PM    Final    VAS US CAROTID (at [facility] and [facility] only)  Result Date: 12/21/2019 Carotid Arterial Duplex Study Indications:       CVA, Speech disturbance and Weakness. Risk Factors:      Hypertension, hyperlipidemia. Other Factors:     Cervical stenosis. Comparison Study:  Prior study from 05/21/17 is available for comparison Performing Technologist: Sharion Dove RVS  Examination Guidelines: A complete evaluation includes B-mode imaging, spectral Doppler, color Doppler, and power Doppler as needed of all accessible portions of each vessel. Bilateral testing is considered an integral part of a complete examination. Limited examinations for reoccurring indications may be performed as noted.  Right Carotid Findings: +----------+--------+--------+--------+------------------+------------------+           PSV cm/sEDV cm/sStenosisPlaque DescriptionComments           +----------+--------+--------+--------+------------------+------------------+ CCA Prox  123     20  intimal thickening +----------+--------+--------+--------+------------------+------------------+ CCA Distal75      22                                intimal thickening +----------+--------+--------+--------+------------------+------------------+ ICA Prox  94      24              calcific          Shadowing           +----------+--------+--------+--------+------------------+------------------+ ICA Distal117     36                                                   +----------+--------+--------+--------+------------------+------------------+ ECA       78      12                                                   +----------+--------+--------+--------+------------------+------------------+ +----------+--------+-------+--------+-------------------+           PSV cm/sEDV cmsDescribeArm Pressure (mmHG) +----------+--------+-------+--------+-------------------+ [id number]                                        +----------+--------+-------+--------+-------------------+ +---------+--------+--+--------+--+ VertebralPSV cm/s87EDV cm/s22 +---------+--------+--+--------+--+  Left Carotid Findings: +----------+--------+--------+--------+------------------+------------------+           PSV cm/sEDV cm/sStenosisPlaque DescriptionComments           +----------+--------+--------+--------+------------------+------------------+ CCA Prox  114     21                                intimal thickening +----------+--------+--------+--------+------------------+------------------+ CCA Distal89      24                                intimal thickening +----------+--------+--------+--------+------------------+------------------+ ICA Prox  130     31              calcific                             +----------+--------+--------+--------+------------------+------------------+ ICA Distal107     33                                                   +----------+--------+--------+--------+------------------+------------------+ ECA       90      13                                                   +----------+--------+--------+--------+------------------+------------------+ +----------+--------+--------+--------+-------------------+           PSV cm/sEDV cm/sDescribeArm Pressure  (mmHG) +----------+--------+--------+--------+-------------------+ [id number]                                          +----------+--------+--------+--------+-------------------+ +---------+--------+---+--------+--+  [place] cm/s134EDV cm/s24 +---------+--------+---+--------+--+   Summary: Right Carotid: Velocities in the right ICA are consistent with a 1-39% stenosis.                No significant change since study done 05/21/17. Left Carotid: Velocities in the left ICA are consistent with a 1-39% stenosis.               No significant change since study done 05/21/17. Vertebrals:  Bilateral vertebral arteries demonstrate antegrade flow. Subclavians: Normal flow hemodynamics were seen in bilateral subclavian              arteries. *See table(s) above for measurements and observations.     Preliminary      Labs:   Basic Metabolic Panel: Recent Labs  Lab 12/19/19 1508 12/19/19 1508 12/20/19 1654 12/20/19 1654 12/20/19 1705 12/21/19 0224  NA 137  --  136  --  137 137  K 4.5   < > 3.9   < > 3.9 3.8  CL 102  --  101  --  103 104  CO2 26  --  24  --   --  24  GLUCOSE 90  --  117*  --  110* 102*  BUN 17  --  13  --  13 10  CREATININE 1.23*  --  1.13  --  1.10 1.07  CALCIUM 9.8  --  9.6  --   --  9.2   < > = values in this interval not displayed.   GFR Estimated Creatinine Clearance: 53.2 mL/min (by C-G formula based on SCr of 1.07 mg/dL). Liver Function Tests: Recent Labs  Lab 12/19/19 1508 12/20/19 1654 12/21/19 0224  AST 17 18 15   ALT 12 17 13   ALKPHOS  --  59 57  BILITOT 0.4 0.8 0.6  PROT 6.7 6.4* 5.9*  ALBUMIN  --  4.1 3.6   No results for input(s): LIPASE, AMYLASE in the last 168 hours. No results for input(s): AMMONIA in the last 168 hours. Coagulation profile Recent Labs  Lab 12/20/19 1654  INR 1.0    CBC: Recent Labs  Lab 12/19/19 1508 12/20/19 1654 12/20/19 1705 12/21/19 0224  WBC 4.5 4.4  --  4.8  NEUTROABS 2,844 2.4  --   --   HGB 11.2*  11.1* 10.9* 10.2*  HCT 34.4* 35.5* 32.0* 32.0*  MCV 87.5 89.9  --  88.4  PLT 198 192  --  181   Cardiac Enzymes: No results for input(s): CKTOTAL, CKMB, CKMBINDEX, TROPONINI in the last 168 hours. BNP: Invalid input(s): POCBNP CBG: No results for input(s): GLUCAP in the last 168 hours. D-Dimer No results for input(s): DDIMER in the last 72 hours. Hgb A1c Recent Labs    12/21/19 0224  HGBA1C 4.7*   Lipid Profile Recent Labs    12/21/19 0224  CHOL 181  HDL 55  LDLCALC 117*  TRIG 45  CHOLHDL 3.3   Thyroid function studies Recent Labs    12/19/19 1508  TSH 2.11   Anemia work up Recent Labs    12/19/19 [place]   Microbiology Recent Results (from the past 240 hour(s))  SARS CORONAVIRUS 2 (TAT 6-24 HRS) Nasopharyngeal Nasopharyngeal Swab     Status: None   Collection Time: 12/20/19 10:11 PM   Specimen: Nasopharyngeal Swab  Result Value Ref Range Status   SARS Coronavirus 2 NEGATIVE NEGATIVE Final    Comment: (NOTE) SARS-CoV-2 target nucleic acids are NOT DETECTED. The SARS-CoV-2 RNA is generally  detectable in upper and lower respiratory specimens during the acute phase of infection. Negative results do not preclude SARS-CoV-2 infection, do not rule out co-infections with other pathogens, and should not be used as the sole basis for treatment or other patient management decisions. Negative results must be combined with clinical observations, patient history, and epidemiological information. The expected result is Negative. Fact Sheet for Patients: SugarRoll.be Fact Sheet for Healthcare Providers: https://www.woods-mathews.com/ This test is not yet approved or cleared by the [place] FDA and  has been authorized for detection and/or diagnosis of SARS-CoV-2 by FDA under an Emergency Use Authorization (EUA). This EUA will remain  in effect (meaning this test can be used) for the duration of the COVID-19  declaration under Section 56 4(b)(1) of the Act, 21 U.S.C. section 360bbb-3(b)(1), unless the authorization is terminated or revoked sooner. Performed at [facility], [place] [address], [place], [place] [address]      Discharge Instructions:   Discharge Instructions    Ambulatory referral to Neurology   Complete by: As directed    Follow up with Dr. Leonie Man at [facility] in 4-6 weeks. Dr. Clydene Fake previous pt. Thanks.   Diet - low sodium heart healthy   Complete by: As directed    Discharge instructions   Complete by: As directed    aspirin 81 mg daily plus clopidogrel (plavix) 75 mg daily for 3 weeks and then ASA alone.   Increase activity slowly   Complete by: As directed      Allergies as of 12/21/2019   No Known Allergies     Medication List    TAKE these medications   acetaminophen 325 MG tablet Commonly known as: TYLENOL Take 650 mg by mouth every 6 (six) hours as needed for mild pain or headache.   aspirin 81 MG EC tablet Take 1 tablet (81 mg total) by mouth daily. Start taking on: December 22, 2019   atorvastatin 40 MG tablet Commonly known as: LIPITOR Take 1 tablet (40 mg total) by mouth daily at 6 PM.   Centrum Silver Adult 50+ Tabs Take 1 capsule by mouth 2 (two) times daily.   clopidogrel 75 MG tablet Commonly known as: PLAVIX Take 1 tablet (75 mg total) by mouth daily for 21 days. Start taking on: December 22, 2019   diclofenac sodium 1 % Gel Commonly known as: Voltaren Apply 2 g topically 4 (four) times daily.   ferrous sulfate 325 (65 FE) MG tablet Take 1 tablet (325 mg total) by mouth 2 (two) times daily with a meal. What changed: when to take this   losartan 50 MG tablet Commonly known as: COZAAR TAKE ONE (1) TABLET BY MOUTH EVERY DAY FOR BLOOD PRESSURE What changed: See the new instructions.   sildenafil 100 MG tablet Commonly known as: VIAGRA TAKE ONE TABLET (100MG  TOTAL) BY MOUTH DAILY AS NEEDED FOR ERECTILE DYSFUNCTION What changed: See  the new instructions.      Follow-up Information    Garvin Fila, MD. Schedule an appointment as soon as possible for a visit in 4 week(s).   Specialties: Neurology, Radiology Contact information: [address] [place] [address] [phone]        [name], MD Follow up in 1 week(s).   Specialty: Family Medicine Contact information: [address] [place] [address] [phone]            Time coordinating discharge: 35 min  Signed:  Geradine Girt  DO  [facility] 12/21/2019, 1:50 PM

## 2019-12-23 HISTORY — PX: OTHER SURGICAL HISTORY: SHX169

## 2019-12-24 ENCOUNTER — Ambulatory Visit (INDEPENDENT_AMBULATORY_CARE_PROVIDER_SITE_OTHER): Payer: Medicare Other | Admitting: Family Medicine

## 2019-12-24 ENCOUNTER — Other Ambulatory Visit: Payer: Self-pay

## 2019-12-24 ENCOUNTER — Encounter: Payer: Self-pay | Admitting: Family Medicine

## 2019-12-24 VITALS — BP 156/80 | HR 78 | Temp 97.4°F | Resp 14 | Ht 67.0 in | Wt 146.0 lb

## 2019-12-24 DIAGNOSIS — I6329 Cerebral infarction due to unspecified occlusion or stenosis of other precerebral arteries: Secondary | ICD-10-CM

## 2019-12-24 DIAGNOSIS — I6322 Cerebral infarction due to unspecified occlusion or stenosis of basilar arteries: Secondary | ICD-10-CM

## 2019-12-24 DIAGNOSIS — I63539 Cerebral infarction due to unspecified occlusion or stenosis of unspecified posterior cerebral artery: Secondary | ICD-10-CM | POA: Diagnosis not present

## 2019-12-24 DIAGNOSIS — I639 Cerebral infarction, unspecified: Secondary | ICD-10-CM | POA: Insufficient documentation

## 2019-12-24 DIAGNOSIS — I6389 Other cerebral infarction: Secondary | ICD-10-CM | POA: Diagnosis not present

## 2019-12-24 NOTE — Progress Notes (Signed)
Subjective:    Patient ID: Devin Valdez, male    DOB: 12/11/1940, 79 y.o.   MRN: [id number]  HPI  12/19/19 Patient had his first Covid shot about 2 weeks ago.  Since that time, he states that he has felt weak and fatigued.  Specifically he states that he will feel paresthesias in his legs left greater than right.  He will sometimes feel weakness in his legs that comes and goes.  He feels like his legs may give out on him at times.  He also has increased numbness and paresthesias in his arms.  Past medical history is significant for neck surgery due to cervical spinal stenosis.  However his neuropathic symptoms just started about 2 weeks ago.  This morning he was having a conversation with his wife and she felt that he was slurring his speech.  He did not have his teeth then at the time.  He thought this was the cause of it.  He states that it was nothing serious.  However his wife wanted him evaluated.  The slurred speech lasted less than an hour and resolved after he went to lay down but it was a noticeable deficit per his wife's report.  Today he is demonstrating no neurologic deficit.  Cranial nerves II through XII are grossly intact with muscle strength 5/5 equal and symmetric in the upper and lower extremities.  The patient has excellent grip strength.  He denies any saddle anesthesia, bowel or bladder incontinence, or no discernible weakness in his legs.  He has a normal gait.  There is no facial droop.  However the patient does have an irregular heart rate on exam.  Sounds like he is having a PVC every third or fourth beat although I cannot determine this simply from auscultation.  He denies any cough, fever, shortness of breath, chest pain, palpitations, dyspnea on exertion.  At that time, my plan was: I am concerned that the slurred speech this morning could potentially have been a TIA.  The patient has not been taking an aspirin.  Therefore I recommended that he take an aspirin 81 mg a day for  stroke prevention.  I would also like to increase his blood pressure medication to better manage his elevated blood pressure.  Increase losartan to 100 mg a day.  Arrange an MRI of the brain.  Also given his paresthesias and fatigue I will check a CBC, CMP, TSH, vitamin B12.  Await the results of his MRI.  There are no carotid bruits today on exam however if the MRI does confirm a history of ischemic insults in the brain, I would recommend getting carotid Dopplers.  12/24/19 Unfortunately, the patient called back the next day and his wife stated that he was slurring his speech again and was having difficulty moving his left side.  We recommended that he go to emergency room immediately for possible CVA.  The patient was admitted to the hospital.  I have copied relevant portions of his discharge summary below for my reference:  Admit date: 12/20/2019 Discharge date: 12/21/2019  Admitted From: Home Discharge disposition: Home   Recommendations for Outpatient Follow-Up:   Aspirin plus Plavix for 3 weeks then aspirin alone Patient was started on a statin: Please follow-up on LFTs and FLP  Discharge Diagnosis:   Active Problems:   Depression   Anemia   Essential hypertension   CVA (cerebral vascular accident) [facility])    Discharge Condition: Improved.  Diet recommendation: Low sodium, heart healthy  Wound care: None.  Code status: Full.   History of Present Illness:   Devin P [name] a 79 y.o.malewith medical history significant of72 years old male with past medical history of hypertension, hyperlipidemia, anemia, cervical stenosis came with a chief complaint weakness in his legs especially on the left side and slurred speech. He saw his primary doctor yesterday complaining of some general weakness fatigue and some slurred speech. Primary doctor aware he may have a TIA and recommended MRI as an outpatient. Today apparently he had the same episode came to emergency  room.   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 Continue DAPT for 3 weeks and then ASA alone.  PT/OT: no follow up  Hypertension  Permissive hypertension (OK if < 220/120) but gradually normalize in3-5days  Hyperlipidemia -LDL117, goal < 70 - Lipitor40 mg daily   In light of brainstem stroke found on MRI, patient was changed to inpatient as I anticipated that he would be here for at least another night.  Surprisingly, patient's hospital course was faster than anticipated and he worked with PT and did remarkably well for his situation and was ready for discharge prior to the second midnight  CT HEAD WO CONTRAST  Result Date: 12/20/2019 CLINICAL DATA:  Left leg weakness. EXAM: CT HEAD WITHOUT CONTRAST TECHNIQUE: Contiguous axial images were obtained from the base of the skull through the vertex without intravenous contrast. COMPARISON:  May 20, 2017 FINDINGS: Brain: No evidence of acute infarction, hemorrhage, hydrocephalus, extra-axial collection or mass lesion/mass effect. Vascular: No hyperdense vessel or unexpected calcification. Skull: Normal. Negative for fracture or focal lesion. Sinuses/Orbits: There is a 1.6 cm x 1.1 cm right maxillary sinus polyp versus mucous retention cyst. Other: None. IMPRESSION: 1. No acute intracranial pathology. 2. 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 Electronically Signed   By: Devin Valdez M.D.   On: 12/20/2019 19:00   MR ANGIO HEAD WO CONTRAST  Result Date: 12/21/2019 CLINICAL DATA:  Leg weakness.  Acute infarction of the left pons.  EXAM: MRA HEAD WITHOUT CONTRAST TECHNIQUE: Angiographic images of the Circle of Willis were obtained using MRA technique without intravenous contrast. COMPARISON:  MRI yesterday.  MR angiography 05/21/2017. FINDINGS: Both internal carotid arteries are widely patent through the skull base and siphon regions. The anterior and middle cerebral vessels are patent. On the right, there is a very early origin of the anterior temporal branch. Mild atherosclerotic narrowing at the MCA bifurcation. Right A1 segment shows atherosclerotic irregularity and narrowing. On the left, there is a 30% stenosis of the proximal M1 segment. No stenosis seen distal to that. Left anterior cerebral artery is widely patent. Both vertebral arteries are widely patent. Both posteroinferior cerebellar arteries show flow. The basilar artery shows mild atherosclerotic irregularity but no stenosis. Left anterior inferior cerebellar artery shows flow. Flow is present in both superior cerebellar arteries. Both posterior cerebral arteries are patent. 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 This was present previously but better seen today because of less motion degradation. This is of  doubtful clinical significance. IMPRESSION: Both vertebral arteries widely patent to the basilar. Mild atherosclerotic irregularity of the basilar artery without flow limiting stenosis. Flow visible in both posteroinferior cerebellar arteries, a left anterior inferior cerebellar artery, both superior cerebellar arteries and both posterior cerebral arteries. 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 This appears similar in 2018, though not as well seen because of motion degradation. I doubt the clinical significance of this finding. Ordinary atherosclerotic changes of the intracranial  anterior circulation. 30% stenosis of the left M1 segment. Mild atherosclerotic narrowing of the right A1 segment. Electronically Signed   By: Nelson Chimes M.D.   On: 12/21/2019 07:34   MR BRAIN WO CONTRAST  Result Date: 12/20/2019 CLINICAL DATA:  79 year old male with leg weakness. EXAM: MRI HEAD WITHOUT CONTRAST TECHNIQUE: Multiplanar, multiecho pulse sequences of the brain and surrounding structures were obtained without intravenous contrast. COMPARISON:  Head CT earlier tonight. Brain MRI 05/21/2017. FINDINGS: Brain: Patchy and linear restricted diffusion in the left paracentral pons (series 5, image 65). Faint associated T2 and FLAIR hyperintensity. No mass effect or evidence of hemorrhagic transformation. No other restricted diffusion identified. Nomidline shift, mass effect, evidence of mass lesion, ventriculomegaly, extra-axial collection or acute intracranial hemorrhage. Cervicomedullary junction and pituitary are within normal limits. Patchy bilateral cerebral white matter T2 and FLAIR hyperintensity appears stable since 2018. No cortical encephalomalacia or chronic cerebral blood products identified. Deep gray nuclei and cerebellum remain within normal limits. Vascular: Major intracranial vascular flow voids are stable since 2018. Skull and upper cervical spine: Normal for age visible cervical spine. Normal bone marrow signal. Sinuses/Orbits: Stable, negative orbits. Mild right ethmoid sinus mucosal thickening today. 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 Other: Mastoids remain clear. Visible internal auditory structures appear normal. Scalp and face soft tissues appear negative. IMPRESSION: 1. 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 No mass effect or hemorrhage. 2. Otherwise stable noncontrast MRI appearance of the brain since 2018. Mild to moderate for age white matter signal changes most commonly due to chronic small vessel  disease. Electronically Signed   By: Genevie Ann M.D.   On: 12/20/2019 21:13    12/24/19 Patient is here today for follow-up.  Shortly after I saw him, the next day he was at home.  The patient was sharing coffee with his son.  He spilled the coffee on his son trying to pour the coffee into another cup.  This was a sudden change in strength in his right hand.  After that, the patient's wife noticed him slurring his speech again.  She also noticed weakness in the right side of his mouth.  She called our office and we recommended that he go to the emergency room for possible stroke.  Unfortunately this was confirmed that he had had an acute stroke in the left paracentral pons.  Patient was discharged from the hospital on dual antiplatelet therapy for a total of 3 weeks with the plan to resume aspirin After 3 weeks.  He has residual deficits from the stroke.  The patient has some mild slurred speech that was not present when I last saw him.  He also has some weakness in his right grip strength.  He also feels slightly unsteady on his right leg.  With walking he feels like his right leg may give out on him.  He does not feel steady as he turns his body.  He is not using  any type of assist device such as a cane or a walker.  He has not fallen at home however he does feel somewhat unsteady on his feet walking around.  He denies any memory loss.  He denies any headache.  He denies any nausea or vomiting.  He denies any chest pain shortness of breath or dyspnea on exertion.  He denies any peripheral edema.  He is tolerating the cholesterol medication without any myalgias. Past Medical History:  Diagnosis Date  . Anemia   . Anxiety   . 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 Depression   . Falls   . Hallucinations   . Hyperlipidemia   . Hypertension    Past Surgical History:  Procedure Laterality Date  . EYE SURGERY    . HERNIA REPAIR    . POSTERIOR CERVICAL LAMINECTOMY     with fusion  C3-C7 10/19- Dr. Brigitte Pulse at [facility]   Current Outpatient Medications on File Prior to Visit  Medication Sig Dispense Refill  . acetaminophen (TYLENOL) 325 MG tablet Take 650 mg by mouth every 6 (six) hours as needed for mild pain or headache.    Marland Kitchen aspirin EC 81 MG EC tablet Take 1 tablet (81 mg total) by mouth daily.    Marland Kitchen atorvastatin (LIPITOR) 40 MG tablet Take 1 tablet (40 mg total) by mouth daily at 6 PM. 30 tablet 0  . clopidogrel (PLAVIX) 75 MG tablet Take 1 tablet (75 mg total) by mouth daily for 21 days. 21 tablet 0  . diclofenac sodium (VOLTAREN) 1 % GEL Apply 2 g topically 4 (four) times daily. 100 g 1  . ferrous sulfate 325 (65 FE) MG tablet Take 1 tablet (325 mg total) by mouth 2 (two) times daily with a meal. (Patient taking differently: Take 325 mg by mouth at bedtime. ) 60 tablet 1  . losartan (COZAAR) 50 MG tablet TAKE ONE (1) TABLET BY MOUTH EVERY DAY FOR BLOOD PRESSURE (Patient taking differently: Take 50 mg by mouth daily. ) 90 tablet 3  . Multiple Vitamins-Minerals (CENTRUM SILVER ADULT 50+) TABS Take 1 capsule by mouth 2 (two) times daily. 30 tablet 2  . sildenafil (VIAGRA) 100 MG tablet TAKE ONE TABLET (100MG  TOTAL) BY MOUTH DAILY AS NEEDED FOR ERECTILE DYSFUNCTION (Patient taking differently: Take 100 mg by mouth as needed for erectile dysfunction. ) 10 tablet 5   No current facility-administered medications on file prior to visit.   No Known Allergies Social History   Socioeconomic History  . Marital status: Married    Spouse name: Not on file  . Number of children: Not on file  . Years of education: Not on file  . Highest education level: Not on file  Occupational History  . Not on file  Tobacco Use  . Smoking status: Former Smoker    Quit date: 11/21/1985    Years since quitting: 34.1  . Smokeless tobacco: Never Used  Substance and Sexual Activity  . Alcohol use: No  . Drug use: No  . Sexual activity: Yes  Other Topics Concern  . Not on file  Social History  Narrative  . Not on file   Social Determinants of Health   Financial Resource Strain:   . Difficulty of Paying Living Expenses: Not on file  Food Insecurity:   . Worried About Charity fundraiser in the Last Year: Not on file  . Ran Out of Food in the Last Year: Not on file  Transportation Needs:   . Film/video editor (Medical): Not on file  . Lack of Transportation (Non-Medical): Not on file  Physical Activity:   . Days of Exercise per Week: Not on file  . Minutes of Exercise per Session: Not on file  Stress:   . Feeling of Stress : Not on file  Social Connections:   . Frequency of Communication with Friends and Family: Not on file  . Frequency of Social Gatherings with Friends and Family: Not on file  . Attends Religious Services: Not on file  . Active Member of Clubs or Organizations: Not on file  . Attends Archivist Meetings: Not on file  . Marital Status: Not on file  Intimate Partner Violence:   . Fear of Current or Ex-Partner: Not on file  . Emotionally Abused: Not on file  . Physically Abused: Not on file  . Sexually Abused: Not on file      Review of Systems  All other systems reviewed and are negative.      Objective:   Physical Exam Constitutional:      General: He is not in acute distress.    Appearance: Normal appearance. He is normal weight. He is not ill-appearing, toxic-appearing or diaphoretic.  HENT:     Nose: Nose normal.  Eyes:     Extraocular Movements: Extraocular movements intact.     Conjunctiva/sclera: Conjunctivae normal.     Pupils: Pupils are equal, round, and reactive to light.  Neck:     Vascular: No carotid bruit.  Cardiovascular:     Rate and Rhythm: Normal rate. Rhythm irregular.     Heart sounds: Normal heart sounds. No murmur. No friction rub. No gallop.   Pulmonary:     Effort: Pulmonary effort is normal. No respiratory distress.     Breath sounds: Normal breath sounds. No stridor. No wheezing, rhonchi or  rales.  Chest:     Chest wall: No tenderness.  Abdominal:     General: Abdomen is flat. Bowel sounds are normal.     Palpations: Abdomen is soft.  Musculoskeletal:     Cervical back: Neck supple. No rigidity.     Right lower leg: No edema.     Left lower leg: No edema.  Lymphadenopathy:     Cervical: No cervical adenopathy.  Neurological:     General: No focal deficit present.     Mental Status: He is alert and oriented to person, place, and time. Mental status is at baseline.     Cranial Nerves: Facial asymmetry present. No cranial nerve deficit.     Sensory: No sensory deficit.     Motor: Weakness present. No tremor.     Coordination: Coordination is intact. Coordination normal.     Gait: Gait normal.  Psychiatric:        Mood and Affect: Mood normal.        Thought Content: Thought content normal.       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  He has since been started on dual antiplatelet therapy and will continue this for 3 weeks.  After he completes 3 weeks, he will transition to an 81 mg aspirin per day.  He is taking Lipitor and will need recheck CMP and fasting lipid panel in 3 months.  His goal LDL cholesterol be less than 70.  The patient has some mild weakness in his right arm and his right leg.  I do feel that he would benefit from physical therapy as an outpatient to try to help recover this.  He also has some mild slurred speech due to facial weakness on his right side.  I will recheck a CBC today given the fact he is on dual antiplatelet therapy.  I will consult physical therapy.  I will allow permissive hypertension for the next month and then follow the patient up in the office.  If his blood pressure remains high in 1 month we will  institute measures to help lower his blood pressure further.  I also gave the patient a prescription for a walker today for him to use until his strength improves.

## 2019-12-25 LAB — CBC WITH DIFFERENTIAL/PLATELET
Absolute Monocytes: 524 cells/uL (ref 200–950)
Basophils Absolute: 23 cells/uL (ref 0–200)
Basophils Relative: 0.4 %
Eosinophils Absolute: 68 cells/uL (ref 15–500)
Eosinophils Relative: 1.2 %
HCT: 35.8 % — ABNORMAL LOW (ref 38.5–50.0)
Hemoglobin: 11.8 g/dL — ABNORMAL LOW (ref 13.2–17.1)
Lymphs Abs: 889 cells/uL (ref 850–3900)
MCH: 28.5 pg (ref 27.0–33.0)
MCHC: 33 g/dL (ref 32.0–36.0)
MCV: 86.5 fL (ref 80.0–100.0)
MPV: 10.4 fL (ref 7.5–12.5)
Monocytes Relative: 9.2 %
Neutro Abs: 4195 cells/uL (ref 1500–7800)
Neutrophils Relative %: 73.6 %
Platelets: 232 10*3/uL (ref 140–400)
RBC: 4.14 10*6/uL — ABNORMAL LOW (ref 4.20–5.80)
RDW: 11.5 % (ref 11.0–15.0)
Total Lymphocyte: 15.6 %
WBC: 5.7 10*3/uL (ref 3.8–10.8)

## 2019-12-25 LAB — COMPLETE METABOLIC PANEL WITH GFR
AG Ratio: 2 (calc) (ref 1.0–2.5)
ALT: 19 U/L (ref 9–46)
AST: 23 U/L (ref 10–35)
Albumin: 4.5 g/dL (ref 3.6–5.1)
Alkaline phosphatase (APISO): 75 U/L (ref 35–144)
BUN: 15 mg/dL (ref 7–25)
CO2: 24 mmol/L (ref 20–32)
Calcium: 10 mg/dL (ref 8.6–10.3)
Chloride: 103 mmol/L (ref 98–110)
Creat: 1.14 mg/dL (ref 0.70–1.18)
GFR, Est African American: 71 mL/min/{1.73_m2} (ref >=60)
GFR, Est Non African American: 61 mL/min/{1.73_m2} (ref >=60)
Globulin: 2.3 g/dL (calc) (ref 1.9–3.7)
Glucose, Bld: 109 mg/dL — ABNORMAL HIGH (ref 65–99)
Potassium: 4.4 mmol/L (ref 3.5–5.3)
Sodium: 139 mmol/L (ref 135–146)
Total Bilirubin: 0.5 mg/dL (ref 0.2–1.2)
Total Protein: 6.8 g/dL (ref 6.1–8.1)

## 2019-12-27 ENCOUNTER — Ambulatory Visit (HOSPITAL_COMMUNITY): Payer: Medicare Other | Attending: Family Medicine | Admitting: Physical Therapy

## 2019-12-27 ENCOUNTER — Encounter (HOSPITAL_COMMUNITY): Payer: Self-pay | Admitting: Physical Therapy

## 2019-12-27 ENCOUNTER — Other Ambulatory Visit: Payer: Self-pay

## 2019-12-27 DIAGNOSIS — R2689 Other abnormalities of gait and mobility: Secondary | ICD-10-CM | POA: Diagnosis not present

## 2019-12-27 DIAGNOSIS — R29818 Other symptoms and signs involving the nervous system: Secondary | ICD-10-CM | POA: Insufficient documentation

## 2019-12-27 DIAGNOSIS — M6281 Muscle weakness (generalized): Secondary | ICD-10-CM | POA: Insufficient documentation

## 2019-12-27 NOTE — Addendum Note (Signed)
Addended by: Clarene Critchley on: 12/27/2019 04:37 PM   Modules accepted: Orders

## 2019-12-27 NOTE — Therapy (Signed)
[facility] [address] [place], [place], [address] Phone: [phone]   Fax:  [phone]  Physical Therapy Evaluation  Patient Details  Name: Devin Valdez MRN: [id number] Date of Birth: Dec 16, 1940 Referring Provider (PT): [name]   Encounter Date: 12/27/2019  PT End of Session - 12/27/19 1458    Visit Number  1    Number of Visits  12    Date for PT Re-Evaluation  02/07/20    Authorization Type  Primary: Medicare; Secondary: UHC    Authorization Time Period  12/27/19 - 02/07/20    Authorization - Visit Number  1    Authorization - Number of Visits  10    PT Start Time  0109    PT Stop Time  1345    PT Time Calculation (min)  40 min    Equipment Utilized During Treatment  Gait belt    Activity Tolerance  Patient tolerated treatment well    Behavior During Therapy  [facility] for tasks assessed/performed       Past Medical History:  Diagnosis Date  . Anemia   . Anxiety   . 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 Depression   . Falls   . Hallucinations   . Hyperlipidemia   . Hypertension   . Stroke [facility])    left paracentral pons infarct    Past Surgical History:  Procedure Laterality Date  . EYE SURGERY    . HERNIA REPAIR    . POSTERIOR CERVICAL LAMINECTOMY     with fusion C3-C7 10/19- Dr. Brigitte Pulse at [facility]    There were no vitals filed for this visit.   Subjective Assessment - 12/27/19 1319    Subjective  Patient reported that he had a stroke on about 12/20/19. He reported having had slurred speech and some weakness. Patient reported that his ability to stand up and get started walking has decreased since having the stroke. Patient reported that the stroke happened on his left side and that it made his right side weaker. Reported having some weakness in his arms and some tingling in his hands but no tingling or numbness in his lower extremities. Patient reported that he was able to bathe himself  independently today.    Pertinent History  S/P Left Pontine Artery CVA    Limitations  Walking;House hold activities    Diagnostic tests  MRI 12/20/19: Acute brainstem infarct    Patient Stated Goals  Patient wants to have his balance equal and improve his walking    Currently in Pain?  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   Yes    Is the patient reluctant to leave their home because of a fear of falling?  No      Home Environment   Living Environment  Private residence    Living Arrangements  Spouse/significant other    Type of [place] Access  Level entry    Home Layout  Multi-level    Alternate Level Stairs-Number of Steps  7    Alternate Level Stairs-Rails  Right   Going up   Orr - 2 wheels      Prior Function   Level of Independence  Independent;Independent with basic ADLs    Vocation  Retired      Charity fundraiser Status  Within Functional Limits for tasks assessed      Observation/Other Assessments   Focus on Therapeutic Outcomes (FOTO)   Perform next session       Sensation   Light Touch  Appears Intact      ROM / Strength   AROM / PROM / Strength  Strength      Strength   Strength Assessment Site  Hip;Knee;Ankle    Right/Left Hip  Right;Left    Right Hip Flexion  4-/5    Right Hip ABduction  4-/5   Tested sitting   Left Hip Flexion  4+/5    Left Hip ABduction  4+/5   Tested sitting   Right/Left Knee  Right;Left    Right Knee  Flexion  4-/5    Right Knee Extension  5/5    Left Knee Flexion  5/5    Left Knee Extension  5/5    Right/Left Ankle  Right;Left    Right Ankle Dorsiflexion  4-/5    Left Ankle Dorsiflexion  5/5      Transfers   Transfers  Sit to Stand    Sit to Stand  With upper extremity assist    Comments  Patient requires cues to push up from chair rather than pulling on walker      Ambulation/Gait   Ambulation/Gait  Yes    Ambulation Distance (Feet)  50 Feet    Assistive device  Rolling walker    Gait Pattern  Right circumduction;Poor foot clearance - right;Decreased stance time - right    Ambulation Surface  Level;Indoor    Stairs  Yes    Stairs Assistance  5: Supervision    Stair Management Technique  One rail Right;Step to pattern    Number of Stairs  4    Height of Stairs  7    Gait Comments  --      Standardized Balance Assessment   Standardized Balance Assessment  Dynamic Gait Index      Dynamic Gait Index   Level Surface  Moderate Impairment    Change in Gait Speed  Mild Impairment    Gait with Horizontal Head Turns  Mild Impairment    Gait with Vertical Head Turns  Moderate Impairment    Gait and Pivot Turn  Mild Impairment    Step Over Obstacle  Mild Impairment    Step Around Obstacles  Mild Impairment    Steps  Mild Impairment    Total Score  14                Objective measurements completed on examination: See above findings.              PT Education - 12/27/19 1458    Education Details  Discussed examination findings, and POC.    Person(s) Educated  Patient    Methods  Explanation    Comprehension  Verbalized understanding       PT Short Term Goals - 12/27/19 1508      PT SHORT TERM GOAL #1   Title  Patient will report understanding and regular compliance with HEP to improve strength, balance, and overall functional mobility.    Time  3    Period  Weeks    Status  New    Target Date  01/17/20        PT Long Term Goals - 12/27/19  1508      PT LONG TERM GOAL #1   Title  Patient will demonstrate ability to perform sit to stand without upper extremity assistance indicating improved functional strength and balance.    Time  6    Period  Weeks    Status  New    Target Date  02/07/20      PT LONG TERM GOAL #2   Title  Patient will demonstrate improvement of at least 3 points on the DGI indicating improved balance for improved safety with functional mobility.    Time  6    Period  Weeks    Status  New    Target Date  02/07/20      PT LONG TERM GOAL #3   Title  Patient will report improvement in endurance and functional mobility of at least 50%.    Time  6    Period  Weeks    Status  New    Target Date  02/07/20      PT LONG TERM GOAL #4   Title  Patient will ascend and descend at least 4 7'' steps with mod I in order to perform stair negotiation safely at home.    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 Patient presents with deficits in lower extremity strength primarily on the right side and decreased balance. In addition, patient demonstrated several gait deviations with ambulation as well as demonstrating lack of knowledge regarding use of his walker at this time. Patient would benefit from skilled physical therapy in order to address the abovementioned deficits and help improve patient's overall functional independence.    Personal Factors and Comorbidities  Age;Comorbidity 3+    Comorbidities  CVA, cervical stenosis, depression    Examination-Activity Limitations  Bathing;Stand;Locomotion Level;Bed Mobility;Transfers;Stairs    Examination-Participation Restrictions  Yard Work;Cleaning;Meal Prep;Community Activity    Stability/Clinical Decision Making  Stable/Uncomplicated    Clinical  Decision Making  Low    Rehab Potential  Good    PT Frequency  2x / week    PT Duration  6 weeks    PT Treatment/Interventions  ADLs/Self Care Home Management;Aquatic Therapy;Cryotherapy;Electrical Stimulation;Moist Heat;DME Instruction;Gait training;Stair training;Functional mobility training;Therapeutic activities;Therapeutic exercise;Balance training;Neuromuscular re-education;Patient/family education;Orthotic Fit/Training;Manual techniques;Passive range of motion    PT Next Visit Plan  Review eval/goals. Initiate HEP including: bridges, SLR, and LAQ. Perform FOTO. Focus on gait training and dynamic balance. Educate on proper use of equipment.    PT Home Exercise Plan  Initiate at first treatment    Consulted and Agree with Plan of Care  Patient       Patient will  benefit from skilled therapeutic intervention in order to improve the following deficits and impairments:  Abnormal gait, Decreased knowledge of use of DME, Improper body mechanics, Decreased coordination, Decreased mobility, Postural dysfunction, Decreased activity tolerance, Decreased endurance, Decreased strength, Decreased balance, Difficulty walking  Visit Diagnosis: Other symptoms and signs involving the nervous system  Other abnormalities of gait and mobility  Muscle weakness (generalized)     Problem List Patient Active Problem List   Diagnosis Date Noted  . Stroke ([place])   . CVA (cerebral vascular accident) ([place]) 12/20/2019  . 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[place])   . PVC (premature ventricular contraction)   . Cervical stenosis of spinal canal   . Failure to thrive in adult 05/26/2017  . Failure to thrive (0-17) 05/25/2017  . Abdominal pulsatile mass 05/25/2017  . Anemia 05/25/2017  . Essential hypertension 05/25/2017  . Gait instability 05/20/2017  . Frequent falls 05/20/2017  . Depression 05/20/2017   Clarene Critchley PT, DPT 4:36 PM, 12/27/19 [facility] [address] [place], [place], [address] Phone: [phone]   Fax:  [phone]  Name: Devin Valdez MRN: [id number] Date of Birth: 1940-12-12

## 2019-12-31 ENCOUNTER — Encounter: Payer: Self-pay | Admitting: Family Medicine

## 2020-01-01 ENCOUNTER — Other Ambulatory Visit: Payer: Self-pay

## 2020-01-01 ENCOUNTER — Ambulatory Visit (HOSPITAL_COMMUNITY): Payer: Medicare Other | Admitting: Physical Therapy

## 2020-01-01 ENCOUNTER — Telehealth (HOSPITAL_COMMUNITY): Payer: Self-pay | Admitting: Physical Therapy

## 2020-01-01 DIAGNOSIS — M6281 Muscle weakness (generalized): Secondary | ICD-10-CM | POA: Diagnosis not present

## 2020-01-01 DIAGNOSIS — R29818 Other symptoms and signs involving the nervous system: Secondary | ICD-10-CM

## 2020-01-01 DIAGNOSIS — R2689 Other abnormalities of gait and mobility: Secondary | ICD-10-CM | POA: Diagnosis not present

## 2020-01-01 NOTE — Telephone Encounter (Signed)
Pt will get his second covid shot and will not be here

## 2020-01-01 NOTE — Patient Instructions (Signed)
  Seated Toe Raise REPS: 10  SETS: 3  HOLD: 3  DAILY: 1  WEEKLY: 7 Setup Begin sitting upright on a chair with your feet flat on the floor. Movement Raise your toes up off the floor. Tip Make sure to keep your heels on the floor.    Seated Long Arc Quad REPS: 10  SETS: 3  HOLD: 5  DAILY: 1  WEEKLY: 7 Setup Begin sitting upright in a chair. Movement Slowly straighten one knee so that your leg is straight out in front of you. Hold, and then return to starting position and repeat. Tip Make sure to keep your back straight during the exercise.    Supine Bridge REPS: 10  SETS: 3  HOLD: 5  DAILY: 1  WEEKLY: 7 Setup Begin lying on your back with your arms resting at your sides, your legs bent at the knees and your feet flat on the ground. Movement Tighten your abdominals and slowly lift your hips off the floor into a bridge position, keeping your back straight. Tip Make sure to keep your trunk stiff throughout the exercise and your arms flat on the floor.   Straight Leg Raise REPS: 10  SETS: 3  HOLD: 3  DAILY: 1  WEEKLY: 7 Setup Begin lying on your back with one leg bent and your opposite leg straight. Movement Keeping your leg straight, raise your leg up until your thigh is at the same height of your bent knee. Slowly return to the starting position and repeat. Tip Make sure to not let your leg or pelvis rotate to either side and do not arch your back. Disclaimer: This program provides exercises related to your condition that you can perform at home. As there is a risk of injury with any activity, use caution when performing exercises. If you experience any pain or discomfort, discontinue the exercises and contact your health care provider. Login URL: [email or web address]  .  Access Code: [email or web address] .   Date printed: 01/01/2020

## 2020-01-01 NOTE — Therapy (Signed)
[facility] [address] [place], [place], [address] Phone: [phone]   Fax:  [phone]  Physical Therapy Treatment  Patient Details  Name: Devin Valdez MRN: [id number] Date of Birth: [birth year] Referring Provider (PT): [name]   Encounter Date: 01/01/2020  PT End of Session - 01/01/20 1055    Visit Number  2    Number of Visits  12    Date for PT Re-Evaluation  02/07/20    Authorization Type  Primary: Medicare; Secondary: UHC    Authorization Time Period  12/27/19 - 02/07/20    Authorization - Visit Number  2    Authorization - Number of Visits  10    PT Start Time  1005    PT Stop Time  1052    PT Time Calculation (min)  47 min    Equipment Utilized During Treatment  Gait belt    Activity Tolerance  Patient tolerated treatment well    Behavior During Therapy  WFL for tasks assessed/performed       Past Medical History:  Diagnosis Date  . Anemia   . Anxiety   . 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 Depression   . Falls   . Hallucinations   . Hyperlipidemia   . Hypertension   . Stroke [facility])    left paracentral pons infarct    Past Surgical History:  Procedure Laterality Date  . EYE SURGERY    . HERNIA REPAIR    . POSTERIOR CERVICAL LAMINECTOMY     with fusion C3-C7 10/19- Dr. Brigitte Pulse at [facility]    There were no vitals filed for this visit.  Subjective Assessment - 01/01/20 1015    Subjective  pt reports no pain or issues today.  STates he mostly walks around his house without an AD because he has no room for one.    Currently in Pain?  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    Referring Provider (PT)  [name]      Observation/Other Assessments   Focus on Therapeutic Outcomes (FOTO)   53% limited                   [facility] Adult PT Treatment/Exercise -  01/01/20 0001      Knee/Hip Exercises: Standing   Gait Training  around gym with RW, without AD and withQC      Knee/Hip Exercises: Seated   Long Arc Quad  Both;10 reps;2 sets    Other Seated Knee/Hip Exercises  toeraises 10 reps each 2 sets      Knee/Hip Exercises: Supine   Bridges  Both;2 sets;10 reps    Straight Leg Raises  Both;2 sets;10 reps             PT Education - 01/01/20 1041    Education Details  reviewed goals, initiated HEP, educated on AD and completed FOTO with explanation of results.    Person(s) Educated  Patient    Methods  Explanation;Demonstration;Tactile cues;Verbal cues;Handout    Comprehension  Verbalized understanding;Verbal cues required;Returned demonstration;Tactile cues required       PT Short Term Goals - 01/01/20 1042      PT SHORT TERM GOAL #1   Title  Patient will report understanding and regular compliance with  HEP to improve strength, balance, and overall functional mobility.    Time  3    Period  Weeks    Status  On-going    Target Date  01/17/20        PT Long Term Goals - 01/01/20 1042      PT LONG TERM GOAL #1   Title  Patient will demonstrate ability to perform sit to stand without upper extremity assistance indicating improved functional strength and balance.    Time  6    Period  Weeks    Status  On-going      PT LONG TERM GOAL #2   Title  Patient will demonstrate improvement of at least 3 points on the DGI indicating improved balance for improved safety with functional mobility.    Time  6    Period  Weeks    Status  On-going      PT LONG TERM GOAL #3   Title  Patient will report improvement in endurance and functional mobility of at least 50%.    Time  6    Period  Weeks    Status  On-going      PT LONG TERM GOAL #4   Title  Patient will ascend and descend at least 4 7'' steps with mod I in order to perform stair negotiation safely at home.    Time  6    Period  Weeks    Status  On-going             Plan - 01/01/20 1358    Clinical Impression Statement  Reveiwed goals and POC moving forward.  Initiated new HEP and given written instructions.  Pt with noted weakness in quads with difficulty keeping knees extended through entire ROM with SLR.  Worked on gait using RW, QC and without AD.  pt unable to negotiate QC correctly/sequence.  Pt instructed to continue using RW.  RW was adjusted higher to height as he was bent over with ambulation.  FOTO completed today with 53% limitation. Pt without any questions or concerns today.    Personal Factors and Comorbidities  Age;Comorbidity 3+    Comorbidities  CVA, cervical stenosis, depression    Examination-Activity Limitations  Bathing;Stand;Locomotion Level;Bed Mobility;Transfers;Stairs    Examination-Participation Restrictions  Yard Work;Cleaning;Meal Prep;Community Activity    Stability/Clinical Decision Making  Stable/Uncomplicated    Rehab Potential  Good    PT Frequency  2x / week    PT Duration  6 weeks    PT Treatment/Interventions  ADLs/Self Care Home Management;Aquatic Therapy;Cryotherapy;Electrical Stimulation;Moist Heat;DME Instruction;Gait training;Stair training;Functional mobility training;Therapeutic activities;Therapeutic exercise;Balance training;Neuromuscular re-education;Patient/family education;Orthotic Fit/Training;Manual techniques;Passive range of motion    PT Next Visit Plan  Focus on gait training and dynamic balance. Educate on proper use of equipment.  Progress HEP as appropriate.    PT Home Exercise Plan  01/01/20:  bridges, SLR, seated toeraises and LAQ    Consulted and Agree with Plan of Care  Patient       Patient will benefit from skilled therapeutic intervention in order to improve the following deficits and impairments:  Abnormal gait, Decreased knowledge of use of DME, Improper body mechanics, Decreased coordination, Decreased mobility, Postural dysfunction, Decreased activity tolerance, Decreased  endurance, Decreased strength, Decreased balance, Difficulty walking  Visit Diagnosis: Other abnormalities of gait and mobility  Muscle weakness (generalized)  Other symptoms and signs involving the nervous system     Problem List Patient Active Problem List   Diagnosis Date Noted  .  Stroke ([place])   . CVA (cerebral vascular accident) ([place]) 12/20/2019  . 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[place])   . PVC (premature ventricular contraction)   . Cervical stenosis of spinal canal   . Failure to thrive in adult 05/26/2017  . Failure to thrive (0-17) 05/25/2017  . Abdominal pulsatile mass 05/25/2017  . Anemia 05/25/2017  . Essential hypertension 05/25/2017  . Gait instability 05/20/2017  . Frequent falls 05/20/2017  . Depression 05/20/2017   Teena Irani, PTA/CLT [id number]  Teena Irani 01/01/2020, 2:18 PM  [facility] [address] [place], [place], [address] Phone: [phone]   Fax:  [phone]  Name: FRANCISCA HARBUCK MRN: [id number] Date of Birth: [birth year]

## 2020-01-03 ENCOUNTER — Ambulatory Visit (HOSPITAL_COMMUNITY): Payer: Medicare Other | Admitting: Physical Therapy

## 2020-01-03 DIAGNOSIS — Z23 Encounter for immunization: Secondary | ICD-10-CM | POA: Diagnosis not present

## 2020-01-08 ENCOUNTER — Encounter (HOSPITAL_COMMUNITY): Payer: Self-pay | Admitting: Physical Therapy

## 2020-01-08 ENCOUNTER — Other Ambulatory Visit: Payer: Self-pay

## 2020-01-08 ENCOUNTER — Ambulatory Visit (HOSPITAL_COMMUNITY): Payer: Medicare Other | Admitting: Physical Therapy

## 2020-01-08 DIAGNOSIS — M6281 Muscle weakness (generalized): Secondary | ICD-10-CM | POA: Diagnosis not present

## 2020-01-08 DIAGNOSIS — R29818 Other symptoms and signs involving the nervous system: Secondary | ICD-10-CM

## 2020-01-08 DIAGNOSIS — R2689 Other abnormalities of gait and mobility: Secondary | ICD-10-CM | POA: Diagnosis not present

## 2020-01-08 NOTE — Therapy (Addendum)
[facility] [address] [place], [place], [address] Phone: [phone]   Fax:  [phone]  Physical Therapy Treatment  Patient Details  Name: Devin Valdez MRN: [id number] Date of Birth: [birth year] Referring Provider (PT): [name]   Encounter Date: 01/08/2020  PT End of Session - 01/08/20 1357    Visit Number  3    Number of Visits  12    Date for PT Re-Evaluation  02/07/20    Authorization Type  Primary: Medicare; Secondary: UHC    Authorization Time Period  12/27/19 - 02/07/20    Authorization - Visit Number  3    Authorization - Number of Visits  10    PT Start Time  1300    PT Stop Time  1345    PT Time Calculation (min)  45 min    Equipment Utilized During Treatment  Gait belt    Activity Tolerance  Patient tolerated treatment well    Behavior During Therapy  [facility] for tasks assessed/performed       Past Medical History:  Diagnosis Date  . Anemia   . Anxiety   . 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 Depression   . Falls   . Hallucinations   . Hyperlipidemia   . Hypertension   . Stroke [facility])    left paracentral pons infarct    Past Surgical History:  Procedure Laterality Date  . EYE SURGERY    . HERNIA REPAIR    . POSTERIOR CERVICAL LAMINECTOMY     with fusion C3-C7 10/19- Dr. Brigitte Pulse at [facility]    There were no vitals filed for this visit.  Subjective Assessment - 01/08/20 1310    Subjective  Patient reported no pain currently and stated he has been using the quad cane at home.    Currently in Pain?  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    Referring Provider (PT)  Cammie Mcgee Pickard      ROM / Strength   AROM / PROM / Strength  Strength      Strength   Strength Assessment Site  Hand    Right/Left hand  Right;Left    Right Hand Grip (lbs)  70   setting #2   Left Hand  Grip (lbs)  90   setting #2                  [facility] Adult PT Treatment/Exercise - 01/08/20 0001      Knee/Hip Exercises: Standing   Gait Training  Ambulation with quad cane cues to improve heel-to-toe gait 226', ambulation without quad cane with turning head and reporting what items on the wall say 226', ambulation without quad cane with vertical head turns 226'    Other Standing Knee Exercises  Tandem ambulation on a line 15' x 4 with CGA. Sidestepping 15' x 2 RT CGA.     Other Standing Knee Exercises  Stepping over (4)6-inch hurdles 15' x 4. Cone rotation in standing reaching outside of BOS and having to search for another cone to place the first cone on top of x 5 minutes.                PT Short Term Goals - 01/01/20 1042      PT  SHORT TERM GOAL #1   Title  Patient will report understanding and regular compliance with HEP to improve strength, balance, and overall functional mobility.    Time  3    Period  Weeks    Status  On-going    Target Date  01/17/20        PT Long Term Goals - 01/01/20 1042      PT LONG TERM GOAL #1   Title  Patient will demonstrate ability to perform sit to stand without upper extremity assistance indicating improved functional strength and balance.    Time  6    Period  Weeks    Status  On-going      PT LONG TERM GOAL #2   Title  Patient will demonstrate improvement of at least 3 points on the DGI indicating improved balance for improved safety with functional mobility.    Time  6    Period  Weeks    Status  On-going      PT LONG TERM GOAL #3   Title  Patient will report improvement in endurance and functional mobility of at least 50%.    Time  6    Period  Weeks    Status  On-going      PT LONG TERM GOAL #4   Title  Patient will ascend and descend at least 4 7'' steps with mod I in order to perform stair negotiation safely at home.    Time  6    Period  Weeks    Status  On-going            Plan - 01/08/20 1417     Clinical Impression Statement  Focused on dynamic balance this session including gait training with head turns and working on turning to reach towards cones. Patient demonstrated decreased foot clearance with ambulation as well as a mid-foot strike with ambulation. Therapist cued patient to perform ambulation with an increase in heel-to-toe gait. Patient required a few therapeutic rest breaks during session due to fatigue. However, patient tolerated exercises well. Discussed with patient the correct use of a quad cane and adjusted the cane to be held in the left upper extremity. Noticed some difficulty with word-finding this session as well as decreased grip strength in the right hand compared to the left. Therapist plans to send referrals to patient's referring provider to have the patient evaluated for occupational therapy and speech therapy to address these concerns as needed.    Personal Factors and Comorbidities  Age;Comorbidity 3+    Comorbidities  CVA, cervical stenosis, depression    Examination-Activity Limitations  Bathing;Stand;Locomotion Level;Bed Mobility;Transfers;Stairs    Examination-Participation Restrictions  Yard Work;Cleaning;Meal Prep;Community Activity    Stability/Clinical Decision Making  Stable/Uncomplicated    Rehab Potential  Good    PT Frequency  2x / week    PT Duration  6 weeks    PT Treatment/Interventions  ADLs/Self Care Home Management;Aquatic Therapy;Cryotherapy;Electrical Stimulation;Moist Heat;DME Instruction;Gait training;Stair training;Functional mobility training;Therapeutic activities;Therapeutic exercise;Balance training;Neuromuscular re-education;Patient/family education;Orthotic Fit/Training;Manual techniques;Passive range of motion    PT Next Visit Plan  Focus on gait training and dynamic balance. Progress HEP as appropriate. F/U on if referrals for speech and OT have been signed.     PT Home Exercise Plan  01/01/20:  bridges, SLR, seated toeraises and LAQ     Consulted and Agree with Plan of Care  Patient       Patient will benefit from skilled therapeutic intervention in order to improve the following  deficits and impairments:  Abnormal gait, Decreased knowledge of use of DME, Improper body mechanics, Decreased coordination, Decreased mobility, Postural dysfunction, Decreased activity tolerance, Decreased endurance, Decreased strength, Decreased balance, Difficulty walking  Visit Diagnosis: Other symptoms and signs involving the nervous system  Other abnormalities of gait and mobility  Muscle weakness (generalized)     Problem List Patient Active Problem List   Diagnosis Date Noted  . Stroke ([place])   . CVA (cerebral vascular accident) ([place]) 12/20/2019  . 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[place])   . PVC (premature ventricular contraction)   . Cervical stenosis of spinal canal   . Failure to thrive in adult 05/26/2017  . Failure to thrive (0-17) 05/25/2017  . Abdominal pulsatile mass 05/25/2017  . Anemia 05/25/2017  . Essential hypertension 05/25/2017  . Gait instability 05/20/2017  . Frequent falls 05/20/2017  . Depression 05/20/2017   Clarene Critchley PT, DPT 2:34 PM, 01/08/20 [place], [place], [address] Phone: [phone]   Fax:  [phone]  Name: Devin Valdez MRN: [id number] Date of Birth: [birth year]

## 2020-01-10 ENCOUNTER — Ambulatory Visit (HOSPITAL_COMMUNITY): Payer: Medicare Other | Admitting: Physical Therapy

## 2020-01-10 ENCOUNTER — Other Ambulatory Visit: Payer: Self-pay

## 2020-01-10 ENCOUNTER — Encounter (HOSPITAL_COMMUNITY): Payer: Self-pay | Admitting: Physical Therapy

## 2020-01-10 DIAGNOSIS — R2689 Other abnormalities of gait and mobility: Secondary | ICD-10-CM | POA: Diagnosis not present

## 2020-01-10 DIAGNOSIS — R29818 Other symptoms and signs involving the nervous system: Secondary | ICD-10-CM | POA: Diagnosis not present

## 2020-01-10 DIAGNOSIS — M6281 Muscle weakness (generalized): Secondary | ICD-10-CM

## 2020-01-10 NOTE — Patient Instructions (Addendum)
Tandem Stance    Stand at a countertop for support as needed. Right foot in front of left, heel touching toe both feet "straight ahead". Stand on Foot Triangle of Support with both feet. Balance in this position _20__ seconds. Do with left foot in front of right. Repeat 3 times.  Copyright  VHI. All rights reserved.

## 2020-01-10 NOTE — Therapy (Signed)
[facility] [address] [place], [place], [address] Phone: [phone]   Fax:  [phone]  Physical Therapy Treatment  Patient Details  Name: Devin Valdez MRN: [id number] Date of Birth: December 24, 1940 Referring Provider (PT): [name]   Encounter Date: 01/10/2020  PT End of Session - 01/10/20 1301    Visit Number  4    Number of Visits  12    Date for PT Re-Evaluation  02/07/20    Authorization Type  Primary: Medicare; Secondary: UHC    Authorization Time Period  12/27/19 - 02/07/20    Authorization - Visit Number  4    Authorization - Number of Visits  10    PT Start Time  1120    PT Stop Time  1202    PT Time Calculation (min)  42 min    Equipment Utilized During Treatment  Gait belt    Activity Tolerance  Patient tolerated treatment well    Behavior During Therapy  WFL for tasks assessed/performed       Past Medical History:  Diagnosis Date  . Anemia   . Anxiety   . 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 Depression   . Falls   . Hallucinations   . Hyperlipidemia   . Hypertension   . Stroke [facility])    left paracentral pons infarct    Past Surgical History:  Procedure Laterality Date  . EYE SURGERY    . HERNIA REPAIR    . POSTERIOR CERVICAL LAMINECTOMY     with fusion C3-C7 10/19- Dr. Brigitte Pulse at [facility]    There were no vitals filed for this visit.  Subjective Assessment - 01/10/20 1258    Subjective  Patient denied any pain, but did report a little tiredness today.    Currently in Pain?  No/denies                       [facility] Adult PT Treatment/Exercise - 01/10/20 0001      Knee/Hip Exercises: Standing   Heel Raises  Both;20 reps    Heel Raises Limitations  Toe raises x 10 both    Forward Step Up  Right;1 set;10 reps;Hand Hold: 1;Step Height: 6"    Gait Training  Ambulation without quad cane 226' with lateral head turns to look at things on the wall     Other Standing  Knee Exercises  Tandem ambulation on a line 15' x 4 with CGA.     Other Standing Knee Exercises  Stepping over (4)6-inch hurdles 15' x 4 forward and then lateral - 1 LOB with lateral stepping leading with RLE requiring A to recover.             PT Education - 01/10/20 1259    Education Details  Updated HEP.    Person(s) Educated  Patient    Methods  Explanation;Handout    Comprehension  Verbalized understanding       PT Short Term Goals - 01/01/20 1042      PT SHORT TERM GOAL #1   Title  Patient will report understanding and regular compliance with HEP to improve strength, balance, and overall functional mobility.    Time  3    Period  Weeks    Status  On-going    Target Date  01/17/20        PT Long Term Goals - 01/01/20 1042  PT LONG TERM GOAL #1   Title  Patient will demonstrate ability to perform sit to stand without upper extremity assistance indicating improved functional strength and balance.    Time  6    Period  Weeks    Status  On-going      PT LONG TERM GOAL #2   Title  Patient will demonstrate improvement of at least 3 points on the DGI indicating improved balance for improved safety with functional mobility.    Time  6    Period  Weeks    Status  On-going      PT LONG TERM GOAL #3   Title  Patient will report improvement in endurance and functional mobility of at least 50%.    Time  6    Period  Weeks    Status  On-going      PT LONG TERM GOAL #4   Title  Patient will ascend and descend at least 4 7'' steps with mod I in order to perform stair negotiation safely at home.    Time  6    Period  Weeks    Status  On-going            Plan - 01/10/20 1540    Clinical Impression Statement  Focused on dynamic balance this session. Added sidestepping over hurdles this session which was very challenging for the patient and which he had 1 LOB requiring assistance to recover. Patient did have difficulty with reaching full heel raise position with  heel raises and required cueing to decrease compensations when leaning back. Patient did mention his legs feeling tired frequently, so therapist checked patient's BP which was found to be 133/77. Continued session as patient reported no other symptoms. Patient would benefit from continued skilled physical therapy in order to continue progressing towards functional goals.    Personal Factors and Comorbidities  Age;Comorbidity 3+    Comorbidities  CVA, cervical stenosis, depression    Examination-Activity Limitations  Bathing;Stand;Locomotion Level;Bed Mobility;Transfers;Stairs    Examination-Participation Restrictions  Yard Work;Cleaning;Meal Prep;Community Activity    Stability/Clinical Decision Making  Stable/Uncomplicated    Rehab Potential  Good    PT Frequency  2x / week    PT Duration  6 weeks    PT Treatment/Interventions  ADLs/Self Care Home Management;Aquatic Therapy;Cryotherapy;Electrical Stimulation;Moist Heat;DME Instruction;Gait training;Stair training;Functional mobility training;Therapeutic activities;Therapeutic exercise;Balance training;Neuromuscular re-education;Patient/family education;Orthotic Fit/Training;Manual techniques;Passive range of motion    PT Next Visit Plan  Focus on gait training and dynamic balance.    PT Home Exercise Plan  01/01/20:  bridges, SLR, seated toeraises and LAQ; 01/10/20: Tandem stance balance each LE forward daily    Consulted and Agree with Plan of Care  Patient       Patient will benefit from skilled therapeutic intervention in order to improve the following deficits and impairments:  Abnormal gait, Decreased knowledge of use of DME, Improper body mechanics, Decreased coordination, Decreased mobility, Postural dysfunction, Decreased activity tolerance, Decreased endurance, Decreased strength, Decreased balance, Difficulty walking  Visit Diagnosis: Other symptoms and signs involving the nervous system  Other abnormalities of gait and  mobility  Muscle weakness (generalized)     Problem List Patient Active Problem List   Diagnosis Date Noted  . Stroke ([place])   . CVA (cerebral vascular accident) ([place]) 12/20/2019  . 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[place])   . PVC (premature ventricular contraction)   . Cervical stenosis of spinal canal   . Failure to thrive in adult 05/26/2017  . Failure to  thrive (0-17) 05/25/2017  . Abdominal pulsatile mass 05/25/2017  . Anemia 05/25/2017  . Essential hypertension 05/25/2017  . Gait instability 05/20/2017  . Frequent falls 05/20/2017  . Depression 05/20/2017   Clarene Critchley PT, DPT 3:41 PM, 01/10/20 [place], [place], [address] Phone: [phone]   Fax:  [phone]  Name: Devin Valdez MRN: [id number] Date of Birth: [birth year]

## 2020-01-13 ENCOUNTER — Emergency Department (HOSPITAL_COMMUNITY): Payer: Medicare Other

## 2020-01-13 ENCOUNTER — Observation Stay (HOSPITAL_COMMUNITY)
Admission: EM | Admit: 2020-01-13 | Discharge: 2020-01-14 | Disposition: A | Discharge status: Home / Self Care | Payer: Medicare Other | Attending: Internal Medicine | Admitting: Internal Medicine

## 2020-01-13 ENCOUNTER — Encounter (HOSPITAL_COMMUNITY): Payer: Self-pay | Admitting: Emergency Medicine

## 2020-01-13 DIAGNOSIS — E785 Hyperlipidemia, unspecified: Secondary | ICD-10-CM | POA: Diagnosis not present

## 2020-01-13 DIAGNOSIS — R29701 NIHSS score 1: Secondary | ICD-10-CM | POA: Insufficient documentation

## 2020-01-13 DIAGNOSIS — Z79899 Other long term (current) drug therapy: Secondary | ICD-10-CM | POA: Diagnosis not present

## 2020-01-13 DIAGNOSIS — Z20822 Contact with and (suspected) exposure to covid-19: Secondary | ICD-10-CM | POA: Insufficient documentation

## 2020-01-13 DIAGNOSIS — I700 Atherosclerosis of aorta: Secondary | ICD-10-CM | POA: Insufficient documentation

## 2020-01-13 DIAGNOSIS — I6789 Other cerebrovascular disease: Secondary | ICD-10-CM | POA: Insufficient documentation

## 2020-01-13 DIAGNOSIS — R296 Repeated falls: Secondary | ICD-10-CM | POA: Insufficient documentation

## 2020-01-13 DIAGNOSIS — R531 Weakness: Secondary | ICD-10-CM | POA: Diagnosis not present

## 2020-01-13 DIAGNOSIS — M4802 Spinal stenosis, cervical region: Secondary | ICD-10-CM

## 2020-01-13 DIAGNOSIS — I493 Ventricular premature depolarization: Secondary | ICD-10-CM | POA: Insufficient documentation

## 2020-01-13 DIAGNOSIS — R2689 Other abnormalities of gait and mobility: Secondary | ICD-10-CM | POA: Insufficient documentation

## 2020-01-13 DIAGNOSIS — I6389 Other cerebral infarction: Secondary | ICD-10-CM | POA: Diagnosis not present

## 2020-01-13 DIAGNOSIS — Z7982 Long term (current) use of aspirin: Secondary | ICD-10-CM | POA: Insufficient documentation

## 2020-01-13 DIAGNOSIS — R5383 Other fatigue: Secondary | ICD-10-CM | POA: Diagnosis not present

## 2020-01-13 DIAGNOSIS — N184 Chronic kidney disease, stage 4 (severe): Secondary | ICD-10-CM | POA: Diagnosis not present

## 2020-01-13 DIAGNOSIS — Z87891 Personal history of nicotine dependence: Secondary | ICD-10-CM | POA: Insufficient documentation

## 2020-01-13 DIAGNOSIS — I129 Hypertensive chronic kidney disease with stage 1 through stage 4 chronic kidney disease, or unspecified chronic kidney disease: Secondary | ICD-10-CM | POA: Diagnosis not present

## 2020-01-13 DIAGNOSIS — I639 Cerebral infarction, unspecified: Secondary | ICD-10-CM | POA: Diagnosis not present

## 2020-01-13 DIAGNOSIS — Z8673 Personal history of transient ischemic attack (TIA), and cerebral infarction without residual deficits: Secondary | ICD-10-CM | POA: Insufficient documentation

## 2020-01-13 DIAGNOSIS — R2681 Unsteadiness on feet: Secondary | ICD-10-CM | POA: Diagnosis present

## 2020-01-13 DIAGNOSIS — R39198 Other difficulties with micturition: Secondary | ICD-10-CM | POA: Diagnosis not present

## 2020-01-13 DIAGNOSIS — I10 Essential (primary) hypertension: Secondary | ICD-10-CM

## 2020-01-13 DIAGNOSIS — J3489 Other specified disorders of nose and nasal sinuses: Secondary | ICD-10-CM | POA: Diagnosis not present

## 2020-01-13 DIAGNOSIS — I6782 Cerebral ischemia: Secondary | ICD-10-CM | POA: Diagnosis not present

## 2020-01-13 DIAGNOSIS — D631 Anemia in chronic kidney disease: Secondary | ICD-10-CM | POA: Diagnosis not present

## 2020-01-13 DIAGNOSIS — R269 Unspecified abnormalities of gait and mobility: Secondary | ICD-10-CM | POA: Diagnosis present

## 2020-01-13 DIAGNOSIS — R9089 Other abnormal findings on diagnostic imaging of central nervous system: Secondary | ICD-10-CM | POA: Diagnosis not present

## 2020-01-13 LAB — I-STAT CHEM 8, ED
BUN: 19 mg/dL (ref 8–23)
Calcium, Ion: 1.2 mmol/L (ref 1.15–1.40)
Chloride: 102 mmol/L (ref 98–111)
Creatinine, Ser: 1.2 mg/dL (ref 0.61–1.24)
Glucose, Bld: 92 mg/dL (ref 70–99)
HCT: 35 % — ABNORMAL LOW (ref 39.0–52.0)
Hemoglobin: 11.9 g/dL — ABNORMAL LOW (ref 13.0–17.0)
Potassium: 4.1 mmol/L (ref 3.5–5.1)
Sodium: 137 mmol/L (ref 135–145)
TCO2: 28 mmol/L (ref 22–32)

## 2020-01-13 LAB — BASIC METABOLIC PANEL
Anion gap: 13 (ref 5–15)
BUN: 16 mg/dL (ref 8–23)
CO2: 26 mmol/L (ref 22–32)
Calcium: 9.8 mg/dL (ref 8.9–10.3)
Chloride: 101 mmol/L (ref 98–111)
Creatinine, Ser: 1.25 mg/dL — ABNORMAL HIGH (ref 0.61–1.24)
GFR calc Af Amer: >60 mL/min (ref >60)
GFR calc non Af Amer: 55 mL/min — ABNORMAL LOW (ref >60)
Glucose, Bld: 96 mg/dL (ref 70–99)
Potassium: 4.1 mmol/L (ref 3.5–5.1)
Sodium: 140 mmol/L (ref 135–145)

## 2020-01-13 LAB — PROTIME-INR
INR: 1.1 (ref 0.8–1.2)
INR: 1.1 (ref 0.8–1.2)
Prothrombin Time: 13.7 seconds (ref 11.4–15.2)
Prothrombin Time: 14.1 seconds (ref 11.4–15.2)

## 2020-01-13 LAB — CBG MONITORING, ED: Glucose-Capillary: 91 mg/dL (ref 70–99)

## 2020-01-13 LAB — CBC
HCT: 39 % (ref 39.0–52.0)
Hemoglobin: 12.3 g/dL — ABNORMAL LOW (ref 13.0–17.0)
MCH: 28.3 pg (ref 26.0–34.0)
MCHC: 31.5 g/dL (ref 30.0–36.0)
MCV: 89.7 fL (ref 80.0–100.0)
Platelets: 229 10*3/uL (ref 150–400)
RBC: 4.35 MIL/uL (ref 4.22–5.81)
RDW: 11.9 % (ref 11.5–15.5)
WBC: 7 10*3/uL (ref 4.0–10.5)
nRBC: 0 % (ref 0.0–0.2)

## 2020-01-13 LAB — SARS CORONAVIRUS 2 (TAT 6-24 HRS): SARS Coronavirus 2: NEGATIVE

## 2020-01-13 LAB — ETHANOL: Alcohol, Ethyl (B): <10 mg/dL (ref <10)

## 2020-01-13 LAB — APTT: aPTT: 25 seconds (ref 24–36)

## 2020-01-13 MED ORDER — ATORVASTATIN CALCIUM 40 MG PO TABS
40.0000 mg | ORAL_TABLET | Freq: Every day | ORAL | Status: DC
  Administered 2020-01-13: 20:00:00 40 mg via ORAL
  Filled 2020-01-13: qty 1

## 2020-01-13 MED ORDER — LIDOCAINE HCL URETHRAL/MUCOSAL 2 % EX GEL
1.0000 "application " | Freq: Once | CUTANEOUS | Status: DC
  Filled 2020-01-13: qty 20

## 2020-01-13 MED ORDER — ENOXAPARIN SODIUM 30 MG/0.3ML ~~LOC~~ SOLN
30.0000 mg | SUBCUTANEOUS | Status: DC
  Administered 2020-01-13: 30 mg via SUBCUTANEOUS
  Filled 2020-01-13: qty 0.3

## 2020-01-13 MED ORDER — SODIUM CHLORIDE 0.9 % IV SOLN: INTRAVENOUS | Status: DC

## 2020-01-13 MED ORDER — ASPIRIN 325 MG PO TABS: 325.0000 mg | ORAL_TABLET | Freq: Every day | ORAL | Status: DC

## 2020-01-13 MED ORDER — FERROUS SULFATE 325 (65 FE) MG PO TABS
325.0000 mg | ORAL_TABLET | Freq: Every day | ORAL | Status: DC
  Administered 2020-01-14: 325 mg via ORAL
  Filled 2020-01-13: qty 1

## 2020-01-13 MED ORDER — ASPIRIN EC 81 MG PO TBEC
81.0000 mg | DELAYED_RELEASE_TABLET | Freq: Every day | ORAL | Status: DC
  Administered 2020-01-13 – 2020-01-14 (×2): 81 mg via ORAL
  Filled 2020-01-13 (×2): qty 1

## 2020-01-13 MED ORDER — CLOPIDOGREL BISULFATE 75 MG PO TABS
75.0000 mg | ORAL_TABLET | Freq: Every day | ORAL | Status: DC
  Administered 2020-01-13 – 2020-01-14 (×2): 75 mg via ORAL
  Filled 2020-01-13 (×2): qty 1

## 2020-01-13 MED ORDER — SENNOSIDES-DOCUSATE SODIUM 8.6-50 MG PO TABS: 1.0000 | ORAL_TABLET | Freq: Every evening | ORAL | Status: DC | PRN

## 2020-01-13 MED ORDER — ADULT MULTIVITAMIN W/MINERALS CH
1.0000 | ORAL_TABLET | Freq: Two times a day (BID) | ORAL | Status: DC
  Administered 2020-01-13 – 2020-01-14 (×2): 1 via ORAL
  Filled 2020-01-13 (×2): qty 1

## 2020-01-13 MED ORDER — STROKE: EARLY STAGES OF RECOVERY BOOK
Freq: Once | Status: DC
  Filled 2020-01-13: qty 1

## 2020-01-13 MED ORDER — SODIUM CHLORIDE 0.9% FLUSH
3.0000 mL | Freq: Once | INTRAVENOUS | Status: AC
  Administered 2020-01-13: 3 mL via INTRAVENOUS

## 2020-01-13 MED ORDER — ASPIRIN 300 MG RE SUPP: 300.0000 mg | Freq: Every day | RECTAL | Status: DC

## 2020-01-13 MED ORDER — ACETAMINOPHEN 325 MG PO TABS
650.0000 mg | ORAL_TABLET | Freq: Four times a day (QID) | ORAL | Status: DC | PRN
  Administered 2020-01-13: 22:00:00 650 mg via ORAL
  Filled 2020-01-13: qty 2

## 2020-01-13 MED ORDER — HYDRALAZINE HCL 10 MG PO TABS
10.0000 mg | ORAL_TABLET | Freq: Four times a day (QID) | ORAL | Status: DC | PRN
  Administered 2020-01-13: 20:00:00 10 mg via ORAL
  Filled 2020-01-13: qty 1

## 2020-01-13 MED ORDER — LOSARTAN POTASSIUM 50 MG PO TABS
50.0000 mg | ORAL_TABLET | Freq: Every day | ORAL | Status: DC
  Administered 2020-01-14: 50 mg via ORAL
  Filled 2020-01-13: qty 1

## 2020-01-13 NOTE — ED Notes (Signed)
Pt returns from MRI, states he feels more confused with details than prior to exam.

## 2020-01-13 NOTE — Consult Note (Signed)
Neurology Consultation  Reason for Consult: Weakness Referring Physician:   CC: Worsening weakness  History is obtained from: Patient, chart  HPI: Devin Valdez is a 79 y.o. 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 He reports that he was discharged after his last stroke, was getting rehab and over the last 2 to 3 days has noticed that he is having more frequent falls and is having a difficult time walking due to incoordination.  I was called by the ED provider for recommendations based on the phone consult-I recommended getting an MRI of the brain which revealed extension of the pontine stroke. Patient denies having noticed spells of hypotension but says that he probably has been dehydrated over the past few weeks as he is not drinking too much water.  He also complains of inability to make urine which he was not having problem before.   Patient was discharged on 12/21/2019 on dual antiplatelets.  He received some outpatient physical therapy at [facility]. Over the past few days he noticed that his gait was considerably worse that she could not maintain his balance.  He had to be using a walker at all times and even that was getting difficult.  He was having hard time transferring himself from the car outside. Reports  LKW: At least 2 days ago tpa given?: no, outside the window Premorbid modified Rankin scale (mRS): 3 (has been using walker since discharge  ROS: ROS was performed and is negative except as noted in the    Past Medical History:  Diagnosis Date  . Anemia   . Anxiety   . 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 Depression   . Falls   . Hallucinations   . Hyperlipidemia   . Hypertension   . Stroke [facility])    left paracentral pons infarct   Family History   Problem Relation Age of Onset  . Multiple sclerosis Son   . Bipolar disorder Son    Social History:   reports that he quit smoking about 34 years ago. He has never used smokeless tobacco. He reports that he does not drink alcohol or use drugs.  Medications No current facility-administered medications for this encounter.  Current Outpatient Medications:  .  acetaminophen (TYLENOL) 325 MG tablet, Take 650 mg by mouth every 6 (six) hours as needed for mild pain or headache., Disp: , Rfl:  .  aspirin EC 81 MG EC tablet, Take 1 tablet (81 mg total) by mouth daily., Disp:  , Rfl:  .  atorvastatin (LIPITOR) 40 MG tablet, Take 1 tablet (40 mg total) by mouth daily at 6 PM., Disp: 30 tablet, Rfl: 0 .  ferrous sulfate 325 (65 FE) MG tablet, Take 1 tablet (325 mg total) by mouth 2 (two) times daily with a meal. (Patient taking differently: Take 325 mg by mouth daily with breakfast. ), Disp: 60 tablet, Rfl: 1 .  losartan (COZAAR) 50 MG tablet, TAKE ONE (1) TABLET BY MOUTH EVERY DAY FOR BLOOD PRESSURE (Patient taking differently: Take 50 mg by mouth daily. ), Disp: 90 tablet, Rfl: 3 .  Multiple Vitamins-Minerals (CENTRUM SILVER ADULT 50+) TABS, Take 1 capsule by mouth 2 (two) times daily., Disp: 30 tablet, Rfl: 2 .  sildenafil (VIAGRA) 100 MG tablet, TAKE ONE TABLET (100MG  TOTAL) BY MOUTH DAILY AS NEEDED FOR  ERECTILE DYSFUNCTION (Patient taking differently: Take 100 mg by mouth as needed for erectile dysfunction. ), Disp: 10 tablet, Rfl: 5 .  diclofenac sodium (VOLTAREN) 1 % GEL, Apply 2 g topically 4 (four) times daily. (Patient not taking: Reported on 01/13/2020), Disp: 100 g, Rfl: 1   Exam: Current vital signs: BP (!) 177/77   Pulse 64   Temp 98.4 F (36.9 C) (Oral)   Resp 16   Ht 5\' 7"  (1.702 m)   Wt 66 kg   SpO2 99%   BMI 22.79 kg/m  Vital signs in last 24 hours: Temp:  [98.4 F (36.9 C)] 98.4 F (36.9 C) (02/22 0712) Pulse Rate:  [59-65] 64 (02/22 1400) Resp:  [11-21] 16 (02/22  1400) BP: (147-185)/(57-81) 177/77 (02/22 1400) SpO2:  [99 %-100 %] 99 % (02/22 1400) Weight:  [66 kg] 66 kg (02/22 0714)  GENERAL: Awake, alert in NAD HEENT: - Normocephalic and atraumatic, dry mm, no LN++, no Thyromegally LUNGS - Clear to auscultation bilaterally with no wheezes CV - S1S2 RRR, no m/r/g, equal pulses bilaterally. ABDOMEN - Soft, nontender, nondistended with normoactive BS Ext: warm, well perfused, intact peripheral pulses-no edema  NEURO:  Mental Status: AA&Ox3  Language: speech is nondysarthric.  Naming, repetition, fluency, and comprehension intact. Cranial Nerves: PERRL. EOMI, visual fields full, no facial asymmetry, facial sensation intact, hearing intact, tongue/uvula/soft palate midline, normal sternocleidomastoid and trapezius muscle strength. No evidence of tongue atrophy or fibrillations Motor: 5/5 all fours with no drift Tone: is normal and bulk is normal Sensation- Intact to light touch bilaterall Coordination: FTN intact bilaterally, mild ataxia in right leg on HKS testing. Gait- deferred NIHSS - 1  Labs I have reviewed labs in epic and the results pertinent to this consultation are: CBC    Component Value Date/Time   WBC 7.0 01/13/2020 0715   RBC 4.35 01/13/2020 0715   HGB 11.9 (L) 01/13/2020 0841   HCT 35.0 (L) 01/13/2020 0841   PLT 229 01/13/2020 0715   MCV 89.7 01/13/2020 0715   MCH 28.3 01/13/2020 0715   MCHC 31.5 01/13/2020 0715   RDW 11.9 01/13/2020 0715   LYMPHSABS 889 12/24/2019 1525   MONOABS 0.6 12/20/2019 1654   EOSABS 68 12/24/2019 1525   BASOSABS 23 12/24/2019 1525    CMP     Component Value Date/Time   NA 137 01/13/2020 0841   K 4.1 01/13/2020 0841   CL 102 01/13/2020 0841   CO2 26 01/13/2020 0715   GLUCOSE 92 01/13/2020 0841   BUN 19 01/13/2020 0841   CREATININE 1.20 01/13/2020 0841   CREATININE 1.14 12/24/2019 1525   CALCIUM 9.8 01/13/2020 0715   PROT 6.8 12/24/2019 1525   ALBUMIN 3.6 12/21/2019 0224   AST 23  12/24/2019 1525   ALT 19 12/24/2019 1525   ALKPHOS 57 12/21/2019 0224   BILITOT 0.5 12/24/2019 1525   GFRNONAA 55 (L) 01/13/2020 0715   GFRNONAA 61 12/24/2019 1525   GFRAA >60 01/13/2020 0715   GFRAA 71 12/24/2019 1525    Lipid Panel     Component Value Date/Time   CHOL 181 12/21/2019 0224   TRIG 45 12/21/2019 0224   HDL 55 12/21/2019 0224   CHOLHDL 3.3 12/21/2019 0224   VLDL 9 12/21/2019 0224   LDLCALC 117 (H) 12/21/2019 0224   LDLCALC 129 (H) 05/09/2019 6314     Imaging I have reviewed the images obtained: MRI examination of the brain-no bleed. Extension of the pontine stroke.  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 On examination he has a little bit of dysmetria on the right leg but reports that his gait is markedly off. MRI of the brain reveals extension of the pontine stroke seen on earlier in the December 21, 2019 MRI scan. This indicates extension of the stroke.  Unclear why this would happen-hypotension/dehydration could play a role.  Impression: Extension of existing recent pontine stroke  Recommendations: #Continue DAPT-he was initially told to continue dual antiplatelets for 21 days and then do only single antiplatelet but we will continue dual antiplatelets for now. #Keep SBP > 140-treat with antihypertensives only if systolic blood pressures greater than 220. #PT, OT, ST #No need to repeat stroke w/u #Tele #Frequent neurochecks Stroke team will follow  -- Amie Portland, MD [facility] Pager: [phone] If 7pm to 7am, please call on call as listed on AMION.

## 2020-01-13 NOTE — ED Provider Notes (Signed)
[facility] EMERGENCY DEPARTMENT Provider Note   CSN: [id number] Arrival date & time: 01/13/20  4259     History Chief Complaint  Patient presents with  . Weakness    Devin Valdez is a 79 y.o. male.  79 yo M with a chief complaints of bilateral lower extremity weakness.  Worsening over the past 48 hours.  Patient was recently in the hospital for a stroke and had the same deficit.  He feels that it has reoccurred.  Just got out of 3 weeks of rehab.  Has follow-up tomorrow with his neurologist and then was supposed to start outpatient rehabilitation.  He denies cough congestion or fever denies nausea vomiting or diarrhea.  Has had some decreased oral intake over the past 24 hours with no known explanation.  He denies abdominal pain denies blood in his stool.  Chronically has dark stools that he attributes to iron supplementation.  Denies headaches or neck pain.  The history is provided by the patient.  Weakness Severity:  Moderate Onset quality:  Gradual Duration:  2 days Timing:  Constant Progression:  Worsening Chronicity:  Recurrent Relieved by:  Nothing Worsened by:  Nothing Ineffective treatments:  None tried Associated symptoms: no abdominal pain, no arthralgias, no chest pain, no diarrhea, no fever, no headaches, no myalgias, no shortness of breath and no vomiting        Past Medical History:  Diagnosis Date  . Anemia   . Anxiety   . 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 Depression   . Falls   . Hallucinations   . Hyperlipidemia   . Hypertension   . Stroke [facility])    left paracentral pons infarct    Patient Active Problem List   Diagnosis Date Noted  . Stroke ([place])   . CVA (cerebral vascular accident) ([place]) 12/20/2019  . 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 PVC (premature ventricular contraction)   . Cervical stenosis of spinal canal   . Failure to thrive in adult 05/26/2017  . Failure to thrive (0-17) 05/25/2017    . Abdominal pulsatile mass 05/25/2017  . Anemia 05/25/2017  . Essential hypertension 05/25/2017  . Gait instability 05/20/2017  . Frequent falls 05/20/2017  . Depression 05/20/2017    Past Surgical History:  Procedure Laterality Date  . EYE SURGERY    . HERNIA REPAIR    . POSTERIOR CERVICAL LAMINECTOMY     with fusion C3-C7 10/19- Dr. Brigitte Pulse at [facility] History  Problem Relation Age of Onset  . Multiple sclerosis Son   . Bipolar disorder Son     Social History   Tobacco Use  . Smoking status: Former Smoker    Quit date: 11/21/1985    Years since quitting: 34.1  . Smokeless tobacco: Never Used  Substance Use Topics  . Alcohol use: No  . Drug use: No    Home Medications Prior to Admission medications   Medication Sig Start Date End Date Taking? Authorizing Provider  acetaminophen (TYLENOL) 325 MG tablet Take 650 mg by mouth every 6 (six) hours as needed for mild pain or headache.   Yes [provider]  aspirin EC 81 MG EC tablet Take 1 tablet (81 mg total) by mouth daily. 12/22/19  Yes Eulogio Bear U, DO  atorvastatin (LIPITOR) 40 MG tablet Take 1 tablet (40 mg total) by mouth daily at 6 PM. 12/21/19  Yes Geradine Girt, DO  ferrous sulfate 325 (65 FE) MG tablet Take 1 tablet (325 mg total) by mouth 2 (two) times daily with a meal. Patient taking differently: Take 325 mg by mouth daily with breakfast.  05/28/17  Yes Theodis Blaze, MD  losartan (COZAAR) 50 MG tablet TAKE ONE (1) TABLET BY MOUTH EVERY DAY FOR BLOOD PRESSURE Patient taking differently: Take 50 mg by mouth daily.  11/20/19  Yes [name], MD  Multiple Vitamins-Minerals (CENTRUM SILVER ADULT 50+) TABS Take 1 capsule by mouth 2 (two) times daily. 01/22/16  Yes Kindl, Nelda Severe, MD  sildenafil (VIAGRA) 100 MG tablet TAKE ONE TABLET (100MG  TOTAL) BY MOUTH DAILY AS NEEDED FOR ERECTILE DYSFUNCTION Patient taking differently: Take 100 mg by mouth as needed for erectile dysfunction.  07/22/19   Yes [name], MD  diclofenac sodium (VOLTAREN) 1 % GEL Apply 2 g topically 4 (four) times daily. Patient not taking: Reported on 01/13/2020 08/06/19   [name], MD    Allergies    Patient has no known allergies.  Review of Systems   Review of Systems  Constitutional: Negative for chills and fever.  HENT: Negative for congestion and facial swelling.   Eyes: Negative for discharge and visual disturbance.  Respiratory: Negative for shortness of breath.   Cardiovascular: Negative for chest pain and palpitations.  Gastrointestinal: Negative for abdominal pain, diarrhea and vomiting.  Musculoskeletal: Negative for arthralgias and myalgias.  Skin: Negative for color change and rash.  Neurological: Positive for weakness. Negative for tremors, syncope and headaches.  Psychiatric/Behavioral: Negative for confusion and dysphoric mood.    Physical Exam Updated Vital Signs BP (!) 177/77   Pulse 64   Temp 98.4 F (36.9 C) (Oral)   Resp 16   Ht 5\' 7"  (1.702 m)   Wt 66 kg   SpO2 99%   BMI 22.79 kg/m   Physical Exam Vitals and nursing note reviewed.  Constitutional:      Appearance: He is well-developed.  HENT:     Head: Normocephalic and atraumatic.  Eyes:     Pupils: Pupils are equal, round, and reactive to light.  Neck:     Vascular: No JVD.  Cardiovascular:     Rate and Rhythm: Normal rate and regular rhythm.     Heart sounds: No murmur. No friction rub. No gallop.   Pulmonary:     Effort: No respiratory distress.     Breath sounds: No wheezing.  Abdominal:     General: There is no distension.     Tenderness: There is no guarding or rebound.  Musculoskeletal:        General: Normal range of motion.     Cervical back: Normal range of motion and neck supple.  Skin:    Coloration: Skin is not pale.     Findings: No rash.  Neurological:     Mental Status: He is alert and oriented to person, place, and time.     GCS: GCS eye subscore is 4. GCS verbal  subscore is 5. GCS motor subscore is 6.     Motor: Motor function is intact.     Coordination: Coordination is intact.     Comments: Trace left-sided facial droop.  Right upper extremity weakness 4-5 compared to left.  Bilateral lower extremity weakness 4 out of 5.  Psychiatric:        Behavior: Behavior normal.     ED Results / Procedures / Treatments   Labs (all labs ordered are listed, but  only abnormal results are displayed) Labs Reviewed  BASIC METABOLIC PANEL - Abnormal; Notable for the following components:      Result Value   Creatinine, Ser 1.25 (*)    GFR calc non Af Amer 55 (*)    All other components within normal limits  CBC - Abnormal; Notable for the following components:   Hemoglobin 12.3 (*)    All other components within normal limits  I-STAT CHEM 8, ED - Abnormal; Notable for the following components:   Hemoglobin 11.9 (*)    HCT 35.0 (*)    All other components within normal limits  SARS CORONAVIRUS 2 (TAT 6-24 HRS)  ETHANOL  PROTIME-INR  APTT  PROTIME-INR  URINALYSIS, ROUTINE W REFLEX MICROSCOPIC  CBG MONITORING, ED    EKG EKG Interpretation  Date/Time:  Monday January 13 2020 07:25:23 EST Ventricular Rate:  65 PR Interval:    QRS Duration: 96 QT Interval:  383 QTC Calculation: 399 R Axis:   53 Text Interpretation: Sinus rhythm Borderline ST elevation, anterior leads No significant change since last tracing Confirmed by Deno Etienne [phone]) on 01/13/2020 12:38:25 PM   Radiology CT HEAD WO CONTRAST  Result Date: 01/13/2020 CLINICAL DATA:  Weakness. EXAM: CT HEAD WITHOUT CONTRAST TECHNIQUE: Contiguous axial images were obtained from the base of the skull through the vertex without intravenous contrast. COMPARISON:  December 20, 2019. FINDINGS: Brain: Mild chronic ischemic white matter disease is noted. No mass effect or midline shift is noted. Ventricular size is within normal limits. There is no evidence of mass lesion, hemorrhage or acute  infarction. Vascular: No hyperdense vessel or unexpected calcification. Skull: Normal. Negative for fracture or focal lesion. Sinuses/Orbits: No acute finding. Other: None. IMPRESSION: Mild chronic ischemic white matter disease. No acute intracranial abnormality seen. Electronically Signed   By: Marijo Conception M.D.   On: 01/13/2020 08:38   DG Chest Port 1 View  Result Date: 01/13/2020 CLINICAL DATA:  Fatigue EXAM: PORTABLE CHEST 1 VIEW COMPARISON:  January 22, 2016 FINDINGS: Lungs are clear. Heart size and pulmonary vascularity are normal. No adenopathy. There is aortic atherosclerosis. No bone lesions. IMPRESSION: No edema or airspace opacity. Heart size normal. Aortic Atherosclerosis (ICD10-I70.0). Electronically Signed   By: Lowella Grip III M.D.   On: 01/13/2020 07:55    Procedures Procedures (including critical care time)  Medications Ordered in ED Medications  sodium chloride flush (NS) 0.9 % injection 3 mL (3 mLs Intravenous Given 01/13/20 1150)    ED Course  I have reviewed the triage vital signs and the nursing notes.  Pertinent labs & imaging results that were available during my care of the patient were reviewed by me and considered in my medical decision making (see chart for details).    MDM Rules/Calculators/A&P                      79 yo M with a chief complaints of weakness.  Feels like he has had another stroke.  States that it feels just like it did when he presented with a stroke about 3 weeks ago.  I just finished his rehab and was doing much better and then had some decreased oral intake last 48 hours.  Now feels that he has the same weakness and is no longer able to walk.  Reading the old neurology notes the patient actually had no right-sided weakness which is obviously evident here today.  Suspect that he may be had a new stroke.  Will  discuss with neurology post CT scan.  Discussed with Dr. Rory Percy, recommended MRI of the brain.  If this is negative for acute  infarcts and the patient is unable to walk suspect that he would likely need to go back to rehab.  We will have social work involved.  MRI with extension of active infarct.  As the patient's disability is actually worsened since his initial stroke presentation and has some extension neurology felt that the patient should be obvious in the hospital.  They recommended keeping the blood pressure somewhat high, continuing his dual antiplatelet and PT and OT.  We will discussed with the hospitalist for observation.  The patients results and plan were reviewed and discussed.   Any x-rays performed were independently reviewed by myself.   Differential diagnosis were considered with the presenting HPI.  Medications  sodium chloride flush (NS) 0.9 % injection 3 mL (3 mLs Intravenous Given 01/13/20 1150)    Vitals:   01/13/20 1315 01/13/20 1330 01/13/20 1345 01/13/20 1400  BP: (!) 182/77 (!) 180/76 (!) 180/74 (!) 177/77  Pulse:    64  Resp: 19 15 17 16   Temp:      TempSrc:      SpO2:    99%  Weight:      Height:        Final diagnoses:  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[place])    Admission/ observation were discussed with the admitting physician, patient and/or family and they are comfortable with the plan.   Final Clinical Impression(s) / ED Diagnoses Final diagnoses:  Extension of stroke [facility])    Rx / DC Orders ED Discharge Orders    None       Deno Etienne, DO 01/13/20 1409

## 2020-01-13 NOTE — Progress Notes (Signed)
Sent text page to Dr. Evangeline Gula about patient's B/P being 184/74.

## 2020-01-13 NOTE — ED Notes (Signed)
States weakness increasing over last few days, worsening yesterday.  3 weeks ago hx of CVA and R side weakness.  States inability to walk after, but has been working with therapy.  Denies any CP, some SOB with exertion.  Alert and oriented x 4.

## 2020-01-13 NOTE — H&P (Signed)
History and Physical    Devin Valdez NOB:[id number] DOB: 05/11/1941 DOA: 01/13/2020  PCP: [name], MD  Patient coming from: Home  I have personally briefly reviewed patient's old medical records in [place]  Chief Complaint: Worsening weakness since Friday  HPI: Devin Valdez is a 79 y.o. male with medical history significant of hypertension, anemia of chronic renal failure, chronic kidney disease stage IV, and hypertension who had a stroke in January 2021 who presents to the emergency department complaining of bilateral lower extremity weakness.  The extremity weakness has been worsening over the past 48 hours.  He was recently in the hospital and had the same deficit.  He feels like his stroke has reoccurred.  After discharge she went home with outpatient physical therapy and has completed 3 weeks of therapy.  He was started on aspirin and Plavix for 3 weeks.  He completed his 3 weeks of Plavix.  He has follow-up with the neurologist tomorrow and then was sitting further rehabilitation.  Has had no fever, nausea, cough, congestion, vomiting, or diarrhea.  Is been drinking a little less over the past 24 hours but cannot quantify.  He does not have any abdominal pain or blood in his stools.  He takes iron and has chronically dark stools.  Severity of his symptoms were moderate onset was gradual.  It is been present for 2 days and constant.  He feels that it has been worsening.  There is a recurrence he of the symptoms.  Nothing has made it better nothing has made it worse.  He has no associated symptoms as noted above.  Neurology was consulted by the emergency department who recommended getting an MRI of the brain.  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  Patient complains of inability to make urine which was not a problem before.  Patient thinks he has not been drinking much water and may be dehydrated.  Stroke extensions usually occur due to hypotension but patient  does not recall any episodes of feeling dizzy or hypotensive or passing out.  Patient was discharged on 12/21/2019 on dual antiplatelet therapy.  He received outpatient physical therapy at [facility].  Neurology diagnosed the patient with an extension of existing recent pontine stroke and recommended restarting dual antiplatelet therapy and observation.   Review of Systems: As per HPI otherwise all other systems reviewed and  negative.  Past Medical History:  Diagnosis Date  . Anemia   . Anxiety   . 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 Depression   . Falls   . Hallucinations   . Hyperlipidemia   . Hypertension   . Stroke [facility])    left paracentral pons infarct    Past Surgical History:  Procedure Laterality Date  . EYE SURGERY    . HERNIA REPAIR    . POSTERIOR CERVICAL LAMINECTOMY     with fusion C3-C7 10/19- Dr. Brigitte Pulse at [place] History Narrative  . Not on file     reports that he quit smoking about 34 years ago. He has never used smokeless tobacco. He reports that he does not drink alcohol or use drugs.  No Known Allergies  Family History  Problem Relation Age of Onset  . Multiple sclerosis Son   . Bipolar disorder Son      Prior to Admission medications   Medication Sig Start Date End Date Taking? Authorizing Provider  acetaminophen (TYLENOL) 325 MG tablet Take 650 mg by mouth every 6 (six) hours as needed for mild pain or headache.   Yes [provider]  aspirin EC 81 MG EC tablet Take 1 tablet (81 mg total) by mouth daily. 12/22/19  Yes Eulogio Bear U, DO  atorvastatin (LIPITOR) 40 MG tablet Take 1 tablet (40 mg total) by mouth daily at 6 PM. 12/21/19  Yes Vann, Jessica U, DO  ferrous sulfate 325 (65 FE) MG tablet Take 1 tablet (325 mg total) by mouth 2 (two) times daily with a meal. Patient taking differently: Take 325 mg by mouth daily with breakfast.  05/28/17  Yes Theodis Blaze, MD  losartan (COZAAR) 50  MG tablet TAKE ONE (1) TABLET BY MOUTH EVERY DAY FOR BLOOD PRESSURE Patient taking differently: Take 50 mg by mouth daily.  11/20/19  Yes [name], MD  Multiple Vitamins-Minerals (CENTRUM SILVER ADULT 50+) TABS Take 1 capsule by mouth 2 (two) times daily. 01/22/16  Yes Kindl, Nelda Severe, MD  sildenafil (VIAGRA) 100 MG tablet TAKE ONE TABLET (100MG  TOTAL) BY MOUTH DAILY AS NEEDED FOR ERECTILE DYSFUNCTION Patient taking differently: Take 100 mg by mouth as needed for erectile dysfunction.  07/22/19  Yes [name], MD    Physical Exam:  Constitutional: NAD, calm, comfortable Vitals:   01/13/20 1315 01/13/20 1330 01/13/20 1345 01/13/20 1400  BP: (!) 182/77 (!) 180/76 (!) 180/74 (!) 177/77  Pulse:    64  Resp: 19 15 17 16   Temp:      TempSrc:      SpO2:    99%  Weight:      Height:       Eyes: PERRL, lids and conjunctivae normal ENMT: Mucous membranes are moist. Posterior pharynx clear of any exudate or lesions.Normal dentition.  Neck: normal, supple, no masses, no thyromegaly Respiratory: clear to auscultation bilaterally, no wheezing, no crackles. Normal respiratory effort. No accessory muscle use.  Cardiovascular: Regular rate and rhythm, no murmurs / rubs / gallops. No extremity edema. 2+ pedal pulses. No carotid bruits.  Abdomen: no tenderness, no masses palpated. No hepatosplenomegaly. Bowel sounds positive.  Musculoskeletal: no clubbing / cyanosis. No joint deformity upper and lower extremities. Good ROM, no contractures. Normal muscle tone.  Skin: no rashes, lesions, ulcers. No induration Neurologic: CN 2-12 grossly intact. Sensation intact, DTR normal. Strength 5/5 in all 4.  Psychiatric: Normal judgment and insight. Alert and oriented x 3. Normal mood.    Labs on Admission: I have personally reviewed following labs and imaging studies  CBC: Recent Labs  Lab 01/13/20 0715 01/13/20 0841  WBC 7.0  --   HGB 12.3* 11.9*  HCT 39.0 35.0*  MCV 89.7  --   PLT 229   --    Basic Metabolic Panel: Recent Labs  Lab 01/13/20 0715 01/13/20 0841  NA 140 137  K 4.1 4.1  CL 101 102  CO2 26  --   GLUCOSE 96 92  BUN 16 19  CREATININE 1.25* 1.20  CALCIUM 9.8  --    GFR: Estimated Creatinine Clearance: 47.4 mL/min (by C-G formula based on SCr of 1.2 mg/dL).  Coagulation Profile: Recent Labs  Lab 01/13/20 0755 01/13/20 0924  INR 1.1 1.1   CBG: Recent Labs  Lab 01/13/20 0721  GLUCAP 91   Urine analysis:    Component Value Date/Time   COLORURINE STRAW (A) 12/20/2019 1855   APPEARANCEUR CLEAR 12/20/2019 1855   LABSPEC 1.005 12/20/2019 1855   PHURINE  5.0 12/20/2019 1855   GLUCOSEU NEGATIVE 12/20/2019 1855   HGBUR NEGATIVE 12/20/2019 1855   BILIRUBINUR NEGATIVE 12/20/2019 1855   KETONESUR NEGATIVE 12/20/2019 1855   PROTEINUR NEGATIVE 12/20/2019 1855   UROBILINOGEN 0.2 05/14/2017 1256   NITRITE NEGATIVE 12/20/2019 1855   LEUKOCYTESUR NEGATIVE 12/20/2019 1855    Radiological Exams on Admission: CT HEAD WO CONTRAST  Result Date: 01/13/2020 CLINICAL DATA:  Weakness. EXAM: CT HEAD WITHOUT CONTRAST TECHNIQUE: Contiguous axial images were obtained from the base of the skull through the vertex without intravenous contrast. COMPARISON:  December 20, 2019. FINDINGS: Brain: Mild chronic ischemic white matter disease is noted. No mass effect or midline shift is noted. Ventricular size is within normal limits. There is no evidence of mass lesion, hemorrhage or acute infarction. Vascular: No hyperdense vessel or unexpected calcification. Skull: Normal. Negative for fracture or focal lesion. Sinuses/Orbits: No acute finding. Other: None. IMPRESSION: Mild chronic ischemic white matter disease. No acute intracranial abnormality seen. Electronically Signed   By: Marijo Conception M.D.   On: 01/13/2020 08:38   MR BRAIN WO CONTRAST  Result Date: 01/13/2020 CLINICAL DATA:  Focal neuro deficit.  Right-sided weakness. EXAM: MRI HEAD WITHOUT CONTRAST TECHNIQUE:  Multiplanar, multiecho pulse sequences of the brain and surrounding structures were obtained without intravenous contrast. COMPARISON:  MRI head 12/20/2019 FINDINGS: Brain: Restricted diffusion left paramedian pons. This appears slightly larger compared to the prior study suggesting interval extension of infarct. No other areas of acute infarct. Generalized atrophy. Chronic microvascular ischemic changes in the white matter are mild. Vascular: Normal arterial flow voids Skull and upper cervical spine: No focal skeletal lesion. Sinuses/Orbits: Mild mucosal edema paranasal sinuses. Bilateral cataract extraction Other: None IMPRESSION: Acute/subacute infarct left pons with mild interval increase in size since 12/20/2019 suggesting interval extension. No other areas of acute infarct. Electronically Signed   By: Franchot Gallo M.D.   On: 01/13/2020 12:59   DG Chest Port 1 View  Result Date: 01/13/2020 CLINICAL DATA:  Fatigue EXAM: PORTABLE CHEST 1 VIEW COMPARISON:  January 22, 2016 FINDINGS: Lungs are clear. Heart size and pulmonary vascularity are normal. No adenopathy. There is aortic atherosclerosis. No bone lesions. IMPRESSION: No edema or airspace opacity. Heart size normal. Aortic Atherosclerosis (ICD10-I70.0). Electronically Signed   By: Lowella Grip III M.D.   On: 01/13/2020 07:55    EKG: Independently reviewed.  Sinus rhythm Borderline ST elevation, anterior leads No significant change since last tracing Personally viewed by me  Assessment/Plan Principal Problem:   Extension of stroke [facility]) Active Problems:   Acute right-sided weakness   Difficulty urinating   Gait instability    1.  Extension of stroke: Patient will be placed in observation as per recommendations by neurology.  Work-up completely done in January.  There is no need to repeat.  We will have PT, OT, and ST evaluate the patient.  He had lipid panel and vascular as well as radiologic testing completed in January.   Echocardiogram was done at that time.  All those tests were unremarkable.  I will restart Plavix and aspirin 81 mg daily.  2.  Acute right-sided weakness: This is worse than it was the other day and may be symptomatic of his new extension of pontine stroke.  PT and OT have been consulted.  3.  Difficulty urinating: We will check bladder scans and rule out urinary retention.  Nursing to notify MD if greater than 300 is noted in postvoid residual.  4.  Gait instability: Patient has now  been using a cane since his stroke.  But since Friday he has still been able to get around but had great difficulty getting in and out of the car.  PT and OT are consulted.    DVT prophylaxis: Enoxaparin Code Status: Full code Family Communication: Spoke with patient's wife Bethena Roys while in the room with the patient.  All of her questions were answered. Disposition Plan: Hopefully home in a.m. Consults called: Neurology stroke team Admission status: Observation It is my clinical opinion that referral for OBSERVATION is reasonable and necessary in this patient based on the above information provided. The aforementioned taken together are felt to place the patient at high risk for further clinical deterioration. However it is anticipated that the patient may be medically stable for discharge from the hospital within 24 to 48 hours.   Lady Deutscher MD FACP [facility] Pager [phone]  How to contact the [facility] Attending or Consulting provider [place] or covering provider during after hours [place], for this patient?  1. Check the care team in [facility] and look for a) attending/consulting [facility] provider listed and b) the [facility] team listed 2. Log into [email or web address] and use [place] universal password to access. If you do not have the password, please contact the hospital operator. 3. Locate the [facility] provider you are looking for under [facility] and page to a number that you can be directly reached. 4. If  you still have difficulty reaching the provider, please page the [facility] (Director on Call) for the Hospitalists listed on amion for assistance.  If 7PM-7AM, please contact night-coverage [email or web address] Password [facility]  01/13/2020, 4:01 PM

## 2020-01-13 NOTE — ED Triage Notes (Signed)
Pt c/o general weakness and feeling tired since yesterday, denies n/v/d, denies fever.

## 2020-01-13 NOTE — ED Notes (Signed)
p tunable to urinate at this time.  Will have MD update on POC.

## 2020-01-13 NOTE — Progress Notes (Signed)
Patient came up to floor from ED in NAD. I had mentioned to the ED nurse that the patient hadn't voided all day but no one had done a bladder scan. On arrival I scanned his bladder and he has >637ml urine. Dr. Evangeline Gula notified and I will proceed with urinary retention protocol.

## 2020-01-13 NOTE — ED Notes (Signed)
Waiting for final MRI result and plan from Neurology.  Most likely will admit and pt notified.

## 2020-01-13 NOTE — Progress Notes (Signed)
Unsuccessful in and out catheter attempt. Provider paged and made aware.

## 2020-01-13 NOTE — Progress Notes (Signed)
Attempted to insert in and out cath with second RN but we were unsuccessful. I reported off to oncoming RN and she is going to attempt in and out.

## 2020-01-14 ENCOUNTER — Ambulatory Visit: Payer: Medicare Other | Admitting: Family Medicine

## 2020-01-14 ENCOUNTER — Telehealth (HOSPITAL_COMMUNITY): Payer: Self-pay | Admitting: Physical Therapy

## 2020-01-14 DIAGNOSIS — Z8673 Personal history of transient ischemic attack (TIA), and cerebral infarction without residual deficits: Secondary | ICD-10-CM | POA: Diagnosis not present

## 2020-01-14 DIAGNOSIS — R531 Weakness: Secondary | ICD-10-CM

## 2020-01-14 DIAGNOSIS — R39198 Other difficulties with micturition: Secondary | ICD-10-CM | POA: Diagnosis not present

## 2020-01-14 DIAGNOSIS — R2681 Unsteadiness on feet: Secondary | ICD-10-CM

## 2020-01-14 DIAGNOSIS — E7800 Pure hypercholesterolemia, unspecified: Secondary | ICD-10-CM | POA: Diagnosis not present

## 2020-01-14 DIAGNOSIS — I10 Essential (primary) hypertension: Secondary | ICD-10-CM | POA: Diagnosis not present

## 2020-01-14 DIAGNOSIS — I639 Cerebral infarction, unspecified: Secondary | ICD-10-CM | POA: Diagnosis not present

## 2020-01-14 DIAGNOSIS — I6389 Other cerebral infarction: Secondary | ICD-10-CM

## 2020-01-14 LAB — URINALYSIS, ROUTINE W REFLEX MICROSCOPIC
Bilirubin Urine: NEGATIVE
Glucose, UA: NEGATIVE mg/dL
Ketones, ur: NEGATIVE mg/dL
Leukocytes,Ua: NEGATIVE
Nitrite: NEGATIVE
Protein, ur: NEGATIVE mg/dL
Specific Gravity, Urine: 1.019 (ref 1.005–1.030)
pH: 5 (ref 5.0–8.0)

## 2020-01-14 LAB — CBC WITH DIFFERENTIAL/PLATELET
Abs Immature Granulocytes: 0.02 10*3/uL (ref 0.00–0.07)
Basophils Absolute: 0 10*3/uL (ref 0.0–0.1)
Basophils Relative: 1 %
Eosinophils Absolute: 0 10*3/uL (ref 0.0–0.5)
Eosinophils Relative: 1 %
HCT: 36.3 % — ABNORMAL LOW (ref 39.0–52.0)
Hemoglobin: 11.6 g/dL — ABNORMAL LOW (ref 13.0–17.0)
Immature Granulocytes: 0 %
Lymphocytes Relative: 12 %
Lymphs Abs: 0.7 10*3/uL (ref 0.7–4.0)
MCH: 28.3 pg (ref 26.0–34.0)
MCHC: 32 g/dL (ref 30.0–36.0)
MCV: 88.5 fL (ref 80.0–100.0)
Monocytes Absolute: 0.5 10*3/uL (ref 0.1–1.0)
Monocytes Relative: 9 %
Neutro Abs: 5 10*3/uL (ref 1.7–7.7)
Neutrophils Relative %: 77 %
Platelets: 257 10*3/uL (ref 150–400)
RBC: 4.1 MIL/uL — ABNORMAL LOW (ref 4.22–5.81)
RDW: 11.9 % (ref 11.5–15.5)
WBC: 6.3 10*3/uL (ref 4.0–10.5)
nRBC: 0 % (ref 0.0–0.2)

## 2020-01-14 LAB — RAPID URINE DRUG SCREEN, HOSP PERFORMED
Amphetamines: NOT DETECTED
Barbiturates: NOT DETECTED
Benzodiazepines: NOT DETECTED
Cocaine: NOT DETECTED
Opiates: NOT DETECTED
Tetrahydrocannabinol: NOT DETECTED

## 2020-01-14 LAB — BASIC METABOLIC PANEL
Anion gap: 10 (ref 5–15)
BUN: 16 mg/dL (ref 8–23)
CO2: 28 mmol/L (ref 22–32)
Calcium: 9.4 mg/dL (ref 8.9–10.3)
Chloride: 101 mmol/L (ref 98–111)
Creatinine, Ser: 1.21 mg/dL (ref 0.61–1.24)
GFR calc Af Amer: >60 mL/min (ref >60)
GFR calc non Af Amer: 57 mL/min — ABNORMAL LOW (ref >60)
Glucose, Bld: 112 mg/dL — ABNORMAL HIGH (ref 70–99)
Potassium: 4.7 mmol/L (ref 3.5–5.1)
Sodium: 139 mmol/L (ref 135–145)

## 2020-01-14 LAB — HEMOGLOBIN A1C
Hgb A1c MFr Bld: 4.6 % — ABNORMAL LOW (ref 4.8–5.6)
Mean Plasma Glucose: 85.32 mg/dL

## 2020-01-14 MED ORDER — CLOPIDOGREL BISULFATE 75 MG PO TABS: 75.0000 mg | ORAL_TABLET | Freq: Every day | ORAL | 2 refills | Status: DC

## 2020-01-14 MED ORDER — ASPIRIN 81 MG PO TBEC: 81.0000 mg | DELAYED_RELEASE_TABLET | Freq: Every day | ORAL | 0 refills | Status: AC

## 2020-01-14 MED ORDER — ENOXAPARIN SODIUM 40 MG/0.4ML ~~LOC~~ SOLN
40.0000 mg | SUBCUTANEOUS | Status: DC
  Administered 2020-01-14: 16:00:00 40 mg via SUBCUTANEOUS
  Filled 2020-01-14: qty 0.4

## 2020-01-14 NOTE — TOC Initial Note (Signed)
Transition of Care [facility]) - Initial/Assessment Note    Patient Details  Name: Devin Valdez MRN: [id number] Date of Birth: Mar 25, 1941  Transition of Care [facility]) CM/SW Contact:    Alexander Mt, LCSW Phone Number: 01/14/2020, 12:12 PM  Clinical Narrative:                 CSW met with pt and pt wife Bethena Roys at bedside [phone]). Pt was finishing assessment with MD upon arrival. Introduced self, role, reason for visit. Aware pt waiting on therapy team to see him here. Pt from home with his wife. They live in a 3 level split level home; deny any issues obtaining/affording medications or transportation to appointments (pt wife transports pt). Pt was active with OPPT at [facility] prior to arrival. He confirms his home address and PCP. He had a scheduled appointment with his PCP and OPPT which his wife has cancelled. We discussed home arrangements. Pt has a cane and a chair that he uses in the shower. He and his wife are interested in 3-n-1 and rolling walker (pt wife had ordered one previously through [employer] which she cancelled today). CSW explained that [facility] team will follow for recommendations. Pt open to continuing with OPPT or if [facility] is recommended they have used [facility] in the past and were satisfied with their services.   TOC team to re-schedule PCP appointment (pref for Monday or Tuesday) and await further recs for therapy services.   Expected Discharge Plan: Skilled Nursing Facility Barriers to Discharge: Continued Medical Work up, Other (comment)(pending therapies)   Patient Goals and CMS Choice Patient states their goals for this hospitalization and ongoing recovery are:: get stronger, be able to get up the stairs again without being tired CMS Medicare.gov Compare Post Acute Care list provided to:: Patient Choice offered to / list presented to : Patient, Spouse  Expected Discharge Plan and Services Expected Discharge Plan: [facility] In-house Referral:  Clinical Social Work Discharge Planning Services: CM Consult Post Acute Care Choice: Durable Medical Equipment, Home Health, Resumption of Svcs/PTA Provider(DME; HH vs OP PT) Living arrangements for the past 2 months: Single Family Home                 DME Arranged: 3-N-1, Walker rolling DME Agency: [employer]  Prior Living Arrangements/Services Living arrangements for the past 2 months: Single Family Home Lives with:: Spouse Patient language and need for interpreter reviewed:: Yes(no needs) Do you feel safe going back to the place where you live?: Yes      Need for Family Participation in Patient Care: Yes (Comment)(assistance with daily cares as needed) Care giver support system in place?: Yes (comment)(pt spouse) Current home services: DME Criminal Activity/Legal Involvement Pertinent to Current Situation/Hospitalization: No - Comment as needed  Permission Sought/Granted Permission sought to share information with : Family Supports Permission granted to share information with : Yes, Verbal Permission Granted  Share Information with NAME: Jerrad Mendibles     Permission granted to share info w Relationship: wife  Permission granted to share info w Contact Information: [phone]  Emotional Assessment Appearance:: Appears stated age Attitude/Demeanor/Rapport: Gracious, Engaged Affect (typically observed): Accepting, Adaptable, Appropriate, Pleasant, Overwhelmed Orientation: : Oriented to Self, Oriented to Place, Oriented to  Time, Oriented to Situation Alcohol / Substance Use: Not Applicable Psych Involvement: No (comment)(not applicable at this time)  Admission diagnosis:  Extension of stroke [facility]) [I63.9] Patient Active Problem List   Diagnosis Date Noted  . 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[place])  01/13/2020  . Acute right-sided weakness 01/13/2020  . Difficulty urinating 01/13/2020  . Stroke ([place])   . CVA (cerebral vascular accident) ([place]) 12/20/2019  . 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[place])    . PVC (premature ventricular contraction)   . Cervical stenosis of spinal canal   . Failure to thrive in adult 05/26/2017  . Failure to thrive (0-17) 05/25/2017  . Abdominal pulsatile mass 05/25/2017  . Anemia 05/25/2017  . Essential hypertension 05/25/2017  . Gait instability 05/20/2017  . Frequent falls 05/20/2017  . Depression 05/20/2017   PCP:  [name], MD Pharmacy:   [place] [address] [place] [address] Phone: [phone] Fax: [phone]  Readmission Risk Interventions No flowsheet data found.

## 2020-01-14 NOTE — Social Work (Signed)
CSW acknowledging consult for SNF placement. Please order PT/OT. Will follow for therapy recommendations needed to best determine disposition/for insurance authorization. Pt has been active with [facility].    Westley Hummer, MSW, [place] [facility]

## 2020-01-14 NOTE — Telephone Encounter (Signed)
pt cancelled appt for tomorrow because he is in the hospital

## 2020-01-14 NOTE — Progress Notes (Signed)
Occupational Therapy Treatment Patient Details Name: Devin Valdez MRN: [id number] DOB: 03/13/1941 Today's Date: 01/14/2020    History of present illness 79 y.o. 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 Dx of extension of stroke.   OT comments  Pt PTA: living with spouse and reporting independence with ADL and mobility with quad cane. Pt currently performing self care with supervisionA to modified independence. Pt sitting EOB for sock donning and standing at sink for UB ADL. Pt with minimal difficulty stand to sit from low commode and requesting BSC for home use. Pt with minimal deficits in weakness and coordination of RUE. Pt given HEP for RUE- AROM, [place] and energy conservation. Pt would greatly benefit from continued OT in OP OT setting. OT signing off.    Follow Up Recommendations  Outpatient OT    Equipment Recommendations  3 in 1 bedside commode    Recommendations for Other Services      Precautions / Restrictions Precautions Precautions: Fall Precaution Comments: denies falls in past couple months Restrictions Weight Bearing Restrictions: No       Mobility Bed Mobility Overal bed mobility: Modified Independent             General bed mobility comments: used rail, HOB up  Transfers Overall transfer level: Independent Equipment used: None                  Balance Overall balance assessment: Modified Independent                                         ADL either performed or assessed with clinical judgement   ADL Overall ADL's : At baseline                                       General ADL Comments: Pt supervisionA level to modified independence level for OOB ADL. Pt sitting EOB for sock  donning and standing at sink for UB ADL. Pt with minimal difficulty stand to sit from low commode.     Vision Baseline Vision/History: Wears glasses Wears Glasses: At all times Patient Visual Report: No change from baseline Vision Assessment?: No apparent visual deficits   Perception     Praxis      Cognition Arousal/Alertness: Awake/alert Behavior During Therapy: WFL for tasks assessed/performed Overall Cognitive Status: Within Functional Limits for tasks assessed                                          Exercises     Shoulder Instructions       General Comments      Pertinent Vitals/ Pain       Pain Assessment: No/denies pain  Home Living Family/patient expects to be discharged to:: Private residence Living Arrangements: Spouse/significant other Available Help at Discharge: Family;Available 24 hours/day         Home Layout: Multi-level               Home Equipment: Cane - quad;Shower seat;Grab bars - tub/shower  Prior Functioning/Environment Level of Independence: Independent with assistive device(s)        Comments: walks with quad cane or no AD, wife supervised shower transfers   Frequency           Progress Toward Goals  OT Goals(current goals can now be found in the care plan section)     Acute Rehab OT Goals Patient Stated Goal: OP OT OT Goal Formulation: With patient Potential to Achieve Goals: Good  Plan      Co-evaluation                 AM-PAC OT "6 Clicks" Daily Activity     Outcome Measure   Help from another person eating meals?: None Help from another person taking care of personal grooming?: None Help from another person toileting, which includes using toliet, bedpan, or urinal?: None Help from another person bathing (including washing, rinsing, drying)?: None Help from another person to put on and taking off regular upper body clothing?: None Help from another person to put on and  taking off regular lower body clothing?: None 6 Click Score: 24    End of Session Equipment Utilized During Treatment: Other (comment)(quad cane)  OT Visit Diagnosis: Muscle weakness (generalized) (M62.81)   Activity Tolerance Patient tolerated treatment well   Patient Left in bed;with call bell/phone within reach;with family/visitor present   Nurse Communication Mobility status        Time: 1345-1410 OT Time Calculation (min): 25 min  Charges: OT General Charges $OT Visit: 1 Visit OT Evaluation $OT Eval Moderate Complexity: 1 Mod OT Treatments $Therapeutic Exercise: 8-22 mins  Jefferey Pica, OTR/L Acute Rehabilitation Services Pager: [phone] Office: [phone]    Teah Votaw C 01/14/2020, 3:06 PM

## 2020-01-14 NOTE — Evaluation (Addendum)
Physical Therapy Evaluation Patient Details Name: Devin Valdez MRN: [id number] DOB: 1940/12/09 Today's Date: 01/14/2020   History of Present Illness  79 y.o. 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 Dx of extension of stroke.  Clinical Impression  Pt ambulated 150' with quad cane and at time with no assistive device, no loss of balance. Pt notes he fatigues more quickly than his baseline. Upon acute DC he would benefit from resumption of outpt PT where he was working on high level balance training.     Follow Up Recommendations Outpatient PT    Equipment Recommendations  Rolling walker with 5" wheels;3in1 (PT)    Recommendations for Other Services       Precautions / Restrictions Precautions Precautions: Fall Precaution Comments: denies falls in past couple months Restrictions Weight Bearing Restrictions: No      Mobility  Bed Mobility Overal bed mobility: Modified Independent             General bed mobility comments: used rail, HOB up  Transfers Overall transfer level: Independent Equipment used: None                Ambulation/Gait Ambulation/Gait assistance: Supervision Gait Distance (Feet): 150 Feet Assistive device: None;Quad cane Gait Pattern/deviations: Decreased stride length;Step-through pattern Gait velocity: WFL   General Gait Details: pt ambulated with SBQC, at times he carried the cane and at times he used it, no loss of balance  Stairs            Wheelchair Mobility    Modified Rankin (Stroke Patients Only)       Balance Overall balance assessment: Modified Independent                                           Pertinent Vitals/Pain Pain Assessment: No/denies pain    Home  Living Family/patient expects to be discharged to:: Private residence Living Arrangements: Spouse/significant other Available Help at Discharge: Family;Available 24 hours/day         Home Layout: Multi-level Home Equipment: Cane - quad;Shower seat;Grab bars - tub/shower      Prior Function Level of Independence: Independent with assistive device(s)         Comments: walks with quad cane or no AD, wife supervised shower transfers     Hand Dominance   Dominant Hand: Left    Extremity/Trunk Assessment   Upper Extremity Assessment Upper Extremity Assessment: Defer to OT evaluation(mild weakness R grip (4/5))    Lower Extremity Assessment Lower Extremity Assessment: Overall WFL for tasks assessed    Cervical / Trunk Assessment Cervical / Trunk Assessment: Normal  Communication   Communication: Expressive difficulties(occaissional slurring of words)  Cognition Arousal/Alertness: Awake/alert Behavior During Therapy: WFL for tasks assessed/performed Overall Cognitive Status: Within Functional Limits for tasks assessed                                        General Comments      Exercises     Assessment/Plan    PT Assessment All further PT needs can be met in the next venue of care  PT Problem  List Decreased balance       PT Treatment Interventions      PT Goals (Current goals can be found in the Care Plan section)  Acute Rehab PT Goals Patient Stated Goal: resume OPPT PT Goal Formulation: All assessment and education complete, DC therapy    Frequency     Barriers to discharge        Co-evaluation               AM-PAC PT "6 Clicks" Mobility  Outcome Measure Help needed turning from your back to your side while in a flat bed without using bedrails?: None Help needed moving from lying on your back to sitting on the side of a flat bed without using bedrails?: None Help needed moving to and from a bed to a chair (including a  wheelchair)?: None Help needed standing up from a chair using your arms (e.g., wheelchair or bedside chair)?: None Help needed to walk in hospital room?: A Little Help needed climbing 3-5 steps with a railing? : A Little 6 Click Score: 22    End of Session Equipment Utilized During Treatment: Gait belt Activity Tolerance: Patient tolerated treatment well Patient left: in bed Nurse Communication: Mobility status PT Visit Diagnosis: Difficulty in walking, not elsewhere classified (R26.2)    Time: 6438-3779 PT Time Calculation (min) (ACUTE ONLY): 26 min   Charges:   PT Evaluation $PT Eval Low Complexity: 1 Low PT Treatments $Gait Training: 8-22 mins       Blondell Reveal Kistler PT 01/14/2020  Acute Rehabilitation Services Pager [phone] Office [phone]

## 2020-01-14 NOTE — Discharge Summary (Signed)
Physician Discharge Summary  Devin Valdez OJJ:[id number] DOB: October 17, 1941 DOA: 01/13/2020  PCP: Devin Frizzle, MD  Admit date: 01/13/2020 Discharge date: 01/14/2020  Time spent: 50 minutes  Recommendations for Outpatient Follow-up:  1. Follow-up with Dr. Leonie Valdez, as previously scheduled on 01/16/2020. 2. Follow-up with Devin Frizzle, MD on 01/20/2020.  Patient's blood pressure will need to be reassessed on follow-up and per neurology goal was to keep systolic blood pressure greater than 140.  Patient will need a basic metabolic profile done on follow-up to follow-up on electrolytes and renal function.  Patient has difficulty urinating will need to be followed up upon.   Discharge Diagnoses:  Principal Problem:   Extension of stroke [facility]) Active Problems:   Gait instability   Acute right-sided weakness   Difficulty urinating   Discharge Condition: Stable  Diet recommendation: Heart healthy  Filed Weights   01/13/20 0714  Weight: 66 kg    History of present illness:  HPI per Dr. Evangeline Valdez Devin Valdez is a 79 y.o. male with medical history significant of hypertension, anemia of chronic renal failure, chronic kidney disease stage IV, and hypertension who had a stroke in January 2021 who presents to the emergency department complaining of bilateral lower extremity weakness.  The extremity weakness has been worsening over the past 48 hours.  He was recently in the hospital and had the same deficit.  He feels like his stroke has reoccurred.  After discharge she went home with outpatient physical therapy and has completed 3 weeks of therapy.  He was started on aspirin and Plavix for 3 weeks.  He completed his 3 weeks of Plavix.  He has follow-up with the neurologist tomorrow and then was sitting further rehabilitation.  Has had no fever, nausea, cough, congestion, vomiting, or diarrhea.  Is been drinking a little less over the past 24 hours but cannot quantify.  He does not have any  abdominal pain or blood in his stools.  He takes iron and has chronically dark stools.  Severity of his symptoms were moderate onset was gradual.  It is been present for 2 days and constant.  He feels that it has been worsening.  There is a recurrence he of the symptoms.  Nothing has made it better nothing has made it worse.  He has no associated symptoms as noted above.  Neurology was consulted by the emergency department who recommended getting an MRI of the brain.  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  Patient complains of inability to make urine which was not a problem before.  Patient thinks he has not been drinking much water and may be dehydrated.  Stroke extensions usually occur due to hypotension but patient does not recall any episodes of feeling dizzy or hypotensive or passing out.  Patient was discharged on 12/21/2019 on dual antiplatelet therapy.  He received outpatient physical therapy at [facility].  Neurology diagnosed the patient with an extension of existing recent pontine stroke and recommended restarting dual antiplatelet therapy and observation.  Hospital Course:  1 extension of CVA Patient noted to have an extension of his stroke as he had presented with worsening lower extremity weakness MRI of the head was consistent with extension of a pontine stroke.  Patient denied any hypotensive spells, nausea or vomiting.  Patient was seen by PT /OT and ST and recommendations were for outpatient therapy.  Patient noted to have an LDL of 117 on January 2021 during last hospitalization.  Neurology was consulted  and patient seen in consultation by Dr. Lorraine Valdez who recommended continuation of dual antiplatelet therapy of aspirin and Plavix, for 3 weeks, and then Plavix daily.  It was felt no need to repeat stroke work-up and to keep systolic blood pressure greater than 140s.  Patient remained in stable condition.  Patient will be discharged in stable improved condition and is to follow-up with  Dr. Leonie Valdez, neurology as previously scheduled on 01/16/2020.  2.  Hypertension Remained stable.  Patient maintained on home regimen of Cozaar.  3.  Gait instability Secondary to problem #1.  Patient was seen by PT/OT.  Outpatient PT recommended.  4.  Difficulty urinating On admission patient did have some complaints of difficulty urinating however did state had improved by day of discharge.  Procedures:  MRI 01/13/2020  Consultations:  Neurology: DevinArora 01/13/2020  Discharge Exam: Vitals:   01/14/20 0509 01/14/20 1211  BP: (!) 168/75 (!) 147/74  Pulse: 64 65  Resp: 18 16  Temp: 98.1 F (36.7 C) 98.4 F (36.9 C)  SpO2: 100% 100%    General: NAD Cardiovascular: RRR Respiratory: CTAB  Discharge Instructions   Discharge Instructions    Ambulatory referral to Physical Therapy   Complete by: As directed    Iontophoresis - 4 mg/ml of dexamethasone: No   T.E.N.S. Unit Evaluation and Dispense as Indicated: No   Diet - low sodium heart healthy   Complete by: As directed    Increase activity slowly   Complete by: As directed      Allergies as of 01/14/2020   No Known Allergies     Medication List    TAKE these medications   acetaminophen 325 MG tablet Commonly known as: TYLENOL Take 650 mg by mouth every 6 (six) hours as needed for mild pain or headache.   aspirin 81 MG EC tablet Take 1 tablet (81 mg total) by mouth daily for 21 days.   atorvastatin 40 MG tablet Commonly known as: LIPITOR Take 1 tablet (40 mg total) by mouth daily at 6 PM.   Centrum Silver Adult 50+ Tabs Take 1 capsule by mouth 2 (two) times daily.   clopidogrel 75 MG tablet Commonly known as: PLAVIX Take 1 tablet (75 mg total) by mouth daily. Start taking on: January 15, 2020   ferrous sulfate 325 (65 FE) MG tablet Take 1 tablet (325 mg total) by mouth 2 (two) times daily with a meal. What changed: when to take this   losartan 50 MG tablet Commonly known as: COZAAR TAKE ONE (1)  TABLET BY MOUTH EVERY DAY FOR BLOOD PRESSURE What changed: See the new instructions.   sildenafil 100 MG tablet Commonly known as: VIAGRA TAKE ONE TABLET (100MG  TOTAL) BY MOUTH DAILY AS NEEDED FOR ERECTILE DYSFUNCTION What changed: See the new instructions.            Durable Medical Equipment  (From admission, onward)         Start     Ordered   01/14/20 1323  For home use only DME Walker rolling  Once    Question Answer Comment  Walker: With 5 Inch Wheels   Patient needs a walker to treat with the following condition Weakness      01/14/20 1323   01/14/20 1323  For home use only DME 3 n 1  Once     01/14/20 1323         No Known Allergies Follow-up Information    Devin Frizzle, MD. Go on 01/20/2020.  Specialty: Family Medicine Why: Your appointment was arranged for on March 1st at 12:25pm, wear a mask and call if you need to reschedule.  Contact information: [address] [place] [address] [phone]        Garvin Fila, MD. Go on 01/16/2020.   Specialties: Neurology, Radiology Contact information: [address] [place] [address] [phone]            The results of significant diagnostics from this hospitalization (including imaging, microbiology, ancillary and laboratory) are listed below for reference.    Significant Diagnostic Studies: CT HEAD WO CONTRAST  Result Date: 01/13/2020 CLINICAL DATA:  Weakness. EXAM: CT HEAD WITHOUT CONTRAST TECHNIQUE: Contiguous axial images were obtained from the base of the skull through the vertex without intravenous contrast. COMPARISON:  December 20, 2019. FINDINGS: Brain: Mild chronic ischemic white matter disease is noted. No mass effect or midline shift is noted. Ventricular size is within normal limits. There is no evidence of mass lesion, hemorrhage or acute infarction. Vascular: No hyperdense vessel or unexpected calcification. Skull: Normal. Negative for fracture or focal  lesion. Sinuses/Orbits: No acute finding. Other: None. IMPRESSION: Mild chronic ischemic white matter disease. No acute intracranial abnormality seen. Electronically Signed   By: Marijo Conception M.D.   On: 01/13/2020 08:38   CT HEAD WO CONTRAST  Result Date: 12/20/2019 CLINICAL DATA:  Left leg weakness. EXAM: CT HEAD WITHOUT CONTRAST TECHNIQUE: Contiguous axial images were obtained from the base of the skull through the vertex without intravenous contrast. COMPARISON:  May 20, 2017 FINDINGS: Brain: No evidence of acute infarction, hemorrhage, hydrocephalus, extra-axial collection or mass lesion/mass effect. Vascular: No hyperdense vessel or unexpected calcification. Skull: Normal. Negative for fracture or focal lesion. Sinuses/Orbits: There is a 1.6 cm x 1.1 cm right maxillary sinus polyp versus mucous retention cyst. Other: None. IMPRESSION: 1. No acute intracranial pathology. 2. 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 Electronically Signed   By: Virgina Norfolk M.D.   On: 12/20/2019 19:00   MR ANGIO HEAD WO CONTRAST  Result Date: 12/21/2019 CLINICAL DATA:  Leg weakness.  Acute infarction of the left pons. EXAM: MRA HEAD WITHOUT CONTRAST TECHNIQUE: Angiographic images of the Circle of Willis were obtained using MRA technique without intravenous contrast. COMPARISON:  MRI yesterday.  MR angiography 05/21/2017. FINDINGS: Both internal carotid arteries are widely patent through the skull base and siphon regions. The anterior and middle cerebral vessels are patent. On the right, there is a very early origin of the anterior temporal branch. Mild atherosclerotic narrowing at the MCA bifurcation. Right A1 segment shows atherosclerotic irregularity and narrowing. On the left, there is a 30% stenosis of the proximal M1 segment. No stenosis seen distal to that. Left anterior cerebral artery is widely patent. Both vertebral arteries are widely patent. Both posteroinferior cerebellar arteries show  flow. The basilar artery shows mild atherosclerotic irregularity but no stenosis. Left anterior inferior cerebellar artery shows flow. Flow is present in both superior cerebellar arteries. Both posterior cerebral arteries are patent. 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 This was present previously but better seen today because of less motion degradation. This is of doubtful clinical significance. IMPRESSION: Both vertebral arteries widely patent to the basilar. Mild atherosclerotic irregularity of the basilar artery without flow limiting stenosis. Flow visible in both posteroinferior cerebellar arteries, a left anterior inferior  cerebellar artery, both superior cerebellar arteries and both posterior cerebral arteries. 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 This appears similar in 2018, though not as well seen because of motion degradation. I doubt the clinical significance of this finding. Ordinary atherosclerotic changes of the intracranial anterior circulation. 30% stenosis of the left M1 segment. Mild atherosclerotic narrowing of the right A1 segment. Electronically Signed   By: Nelson Chimes M.D.   On: 12/21/2019 07:34   MR BRAIN WO CONTRAST  Result Date: 01/13/2020 CLINICAL DATA:  Focal neuro deficit.  Right-sided weakness. EXAM: MRI HEAD WITHOUT CONTRAST TECHNIQUE: Multiplanar, multiecho pulse sequences of the brain and surrounding structures were obtained without intravenous contrast. COMPARISON:  MRI head 12/20/2019 FINDINGS: Brain: Restricted diffusion left paramedian pons. This appears slightly larger compared to the prior study suggesting interval extension of infarct. No other areas of acute infarct. Generalized atrophy. Chronic microvascular ischemic changes in the white matter are mild. Vascular: Normal  arterial flow voids Skull and upper cervical spine: No focal skeletal lesion. Sinuses/Orbits: Mild mucosal edema paranasal sinuses. Bilateral cataract extraction Other: None IMPRESSION: Acute/subacute infarct left pons with mild interval increase in size since 12/20/2019 suggesting interval extension. No other areas of acute infarct. Electronically Signed   By: Franchot Gallo M.D.   On: 01/13/2020 12:59   MR BRAIN WO CONTRAST  Result Date: 12/20/2019 CLINICAL DATA:  79 year old male with leg weakness. EXAM: MRI HEAD WITHOUT CONTRAST TECHNIQUE: Multiplanar, multiecho pulse sequences of the brain and surrounding structures were obtained without intravenous contrast. COMPARISON:  Head CT earlier tonight. Brain MRI 05/21/2017. FINDINGS: Brain: Patchy and linear restricted diffusion in the left paracentral pons (series 5, image 65). Faint associated T2 and FLAIR hyperintensity. No mass effect or evidence of hemorrhagic transformation. No other restricted diffusion identified. Nomidline shift, mass effect, evidence of mass lesion, ventriculomegaly, extra-axial collection or acute intracranial hemorrhage. Cervicomedullary junction and pituitary are within normal limits. Patchy bilateral cerebral white matter T2 and FLAIR hyperintensity appears stable since 2018. No cortical encephalomalacia or chronic cerebral blood products identified. Deep gray nuclei and cerebellum remain within normal limits. Vascular: Major intracranial vascular flow voids are stable since 2018. Skull and upper cervical spine: Normal for age visible cervical spine. Normal bone marrow signal. Sinuses/Orbits: Stable, negative orbits. Mild right ethmoid sinus mucosal thickening today. 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 Other: Mastoids remain clear. Visible internal auditory structures appear normal. Scalp and face soft tissues appear negative. IMPRESSION: 1. Acute brainstem infarct in the left paracentral pons (pontine perforator  artery territory). No mass effect or hemorrhage. 2. Otherwise stable noncontrast MRI appearance of the brain since 2018. Mild to moderate for age white matter signal changes most commonly due to chronic small vessel disease. Electronically Signed   By: Genevie Ann M.D.   On: 12/20/2019 21:13   DG Chest Port 1 View  Result Date: 01/13/2020 CLINICAL DATA:  Fatigue EXAM: PORTABLE CHEST 1 VIEW COMPARISON:  January 22, 2016 FINDINGS: Lungs are clear. Heart size and pulmonary vascularity are normal. No adenopathy. There is aortic atherosclerosis. No bone lesions. IMPRESSION: No edema or airspace opacity. Heart size normal. Aortic Atherosclerosis (ICD10-I70.0). Electronically Signed   By: Lowella Grip III M.D.   On: 01/13/2020 07:55   ECHOCARDIOGRAM COMPLETE  Result Date: 12/21/2019   ECHOCARDIOGRAM REPORT   Patient Name:   Devin Valdez Date of Exam: 12/21/2019 Medical Rec #:  [id number]  Height:       67.0 in Accession #:    [id number]         Weight:       151.0 lb Date of Birth:  23-Feb-1941           BSA:          1.79 m Patient Age:    49 years           BP:           164/73 mmHg Patient Gender: M                  HR:           63 bpm. Exam Location:  Inpatient Procedure: 2D Echo Indications:    stroke 434.91  History:        Patient has prior history of Echocardiogram examinations, most                 recent 05/21/2017. Risk Factors:Hypertension, Dyslipidemia and                 Former Smoker.  Sonographer:    Jannett Celestine RDCS (AE) Referring Phys: [id number] [place]  1. Left ventricular ejection fraction, by visual estimation, is 55 to 60%. The left ventricle has normal function. There is no left ventricular hypertrophy.  2. Left ventricular diastolic parameters are consistent with Grade I diastolic dysfunction (impaired relaxation).  3. The left ventricle has no regional wall motion abnormalities.  4. Global right ventricle has normal systolic function.The right ventricular size  is normal. No increase in right ventricular wall thickness.  5. Left atrial size was normal.  6. Right atrial size was normal.  7. The mitral valve is normal in structure. Mild mitral valve regurgitation.  8. The tricuspid valve is normal in structure.  9. The tricuspid valve is normal in structure. Tricuspid valve regurgitation is trivial. 10. The aortic valve is tricuspid. Aortic valve regurgitation is not visualized. Mild aortic valve sclerosis without stenosis. 11. The pulmonic valve was grossly normal. Pulmonic valve regurgitation is not visualized. FINDINGS  Left Ventricle: Left ventricular ejection fraction, by visual estimation, is 55 to 60%. The left ventricle has normal function. The left ventricle has no regional wall motion abnormalities. The left ventricular internal cavity size was the left ventricle is normal in size. There is no left ventricular hypertrophy. Left ventricular diastolic parameters are consistent with Grade I diastolic dysfunction (impaired relaxation). Right Ventricle: The right ventricular size is normal. No increase in right ventricular wall thickness. Global RV systolic function is has normal systolic function. Left Atrium: Left atrial size was normal in size. Right Atrium: Right atrial size was normal in size Pericardium: There is no evidence of pericardial effusion. Mitral Valve: The mitral valve is normal in structure. Mild mitral valve regurgitation. Tricuspid Valve: The tricuspid valve is normal in structure. Tricuspid valve regurgitation is trivial. Aortic Valve: The aortic valve is tricuspid. Aortic valve regurgitation is not visualized. Mild aortic valve sclerosis is present, with no evidence of aortic valve stenosis. Pulmonic Valve: The pulmonic valve was grossly normal. Pulmonic valve regurgitation is not visualized. Pulmonic regurgitation is not visualized. Aorta: The aortic root is normal in size and structure. IAS/Shunts: No atrial level shunt detected by color flow  Doppler.  LEFT VENTRICLE PLAX 2D LVIDd:         4.30 cm  Diastology LVIDs:         2.90 cm  LV e'  lateral:   11.70 cm/s LV PW:         0.90 cm  LV E/e' lateral: 7.5 LV IVS:        1.00 cm  LV e' medial:    8.38 cm/s LVOT diam:     1.90 cm  LV E/e' medial:  10.4 LV SV:         51 ml LV SV Index:   28.17 LVOT Area:     2.84 cm  RIGHT VENTRICLE RV S prime:     11.40 cm/s TAPSE (M-mode): 1.8 cm LEFT ATRIUM             Index       RIGHT ATRIUM           Index LA diam:        3.20 cm 1.78 cm/m  RA Area:     11.10 cm LA Vol (A2C):   28.3 ml 15.77 ml/m RA Volume:   23.80 ml  13.26 ml/m LA Vol (A4C):   45.3 ml 25.24 ml/m LA Biplane Vol: 38.9 ml 21.68 ml/m  AORTIC VALVE LVOT Vmax:   106.00 cm/s LVOT Vmean:  76.300 cm/s LVOT VTI:    0.273 m  AORTA Ao Root diam: 3.20 cm MITRAL VALVE MV Area (PHT): 2.99 cm             SHUNTS MV PHT:        73.66 msec           Systemic VTI:  0.27 m MV Decel Time: 254 msec             Systemic Diam: 1.90 cm MV E velocity: 87.40 cm/s 103 cm/s MV A velocity: 83.10 cm/s 70.3 cm/s MV E/A ratio:  1.05       1.5  Dorris Carnes MD Electronically signed by Dorris Carnes MD Signature Date/Time: 12/21/2019/1:13:21 PM    Final    VAS US CAROTID (at [facility] and [facility] only)  Result Date: 12/23/2019 Carotid Arterial Duplex Study Indications:       CVA, Speech disturbance and Weakness. Risk Factors:      Hypertension, hyperlipidemia. Other Factors:     Cervical stenosis. Comparison Study:  Prior study from 05/21/17 is available for comparison Performing Technologist: Sharion Dove RVS  Examination Guidelines: A complete evaluation includes B-mode imaging, spectral Doppler, color Doppler, and power Doppler as needed of all accessible portions of each vessel. Bilateral testing is considered an integral part of a complete examination. Limited examinations for reoccurring indications may be performed as noted.  Right Carotid Findings: +----------+--------+--------+--------+------------------+------------------+            PSV cm/sEDV cm/sStenosisPlaque DescriptionComments           +----------+--------+--------+--------+------------------+------------------+ CCA Prox  123     20                                intimal thickening +----------+--------+--------+--------+------------------+------------------+ CCA Distal75      22                                intimal thickening +----------+--------+--------+--------+------------------+------------------+ ICA Prox  94      24              calcific          Shadowing          +----------+--------+--------+--------+------------------+------------------+ ICA [id number]  36                                                   +----------+--------+--------+--------+------------------+------------------+ ECA       78      12                                                   +----------+--------+--------+--------+------------------+------------------+ +----------+--------+-------+--------+-------------------+           PSV cm/sEDV cmsDescribeArm Pressure (mmHG) +----------+--------+-------+--------+-------------------+ [id number]                                        +----------+--------+-------+--------+-------------------+ +---------+--------+--+--------+--+ VertebralPSV cm/s87EDV cm/s22 +---------+--------+--+--------+--+  Left Carotid Findings: +----------+--------+--------+--------+------------------+------------------+           PSV cm/sEDV cm/sStenosisPlaque DescriptionComments           +----------+--------+--------+--------+------------------+------------------+ CCA Prox  114     21                                intimal thickening +----------+--------+--------+--------+------------------+------------------+ CCA Distal89      24                                intimal thickening +----------+--------+--------+--------+------------------+------------------+ ICA Prox  130     31              calcific                              +----------+--------+--------+--------+------------------+------------------+ ICA Distal107     33                                                   +----------+--------+--------+--------+------------------+------------------+ ECA       90      13                                                   +----------+--------+--------+--------+------------------+------------------+ +----------+--------+--------+--------+-------------------+           PSV cm/sEDV cm/sDescribeArm Pressure (mmHG) +----------+--------+--------+--------+-------------------+ [id number]                                          +----------+--------+--------+--------+-------------------+ +---------+--------+---+--------+--+ VertebralPSV cm/s134EDV cm/s24 +---------+--------+---+--------+--+   Summary: Right Carotid: Velocities in the right ICA are consistent with a 1-39% stenosis.                No significant change since study done 05/21/17. Left Carotid: Velocities in the left ICA are consistent with a 1-39% stenosis.  No significant change since study done 05/21/17. Vertebrals:  Bilateral vertebral arteries demonstrate antegrade flow. Subclavians: Normal flow hemodynamics were seen in bilateral subclavian              arteries. *See table(s) above for measurements and observations.  Electronically signed by Antony Contras MD on 12/23/2019 at 12:52:26 PM.    Final     Microbiology: Recent Results (from the past 240 hour(s))  SARS CORONAVIRUS 2 (TAT 6-24 HRS) Nasopharyngeal Nasopharyngeal Swab     Status: None   Collection Time: 01/13/20  3:53 PM   Specimen: Nasopharyngeal Swab  Result Value Ref Range Status   SARS Coronavirus 2 NEGATIVE NEGATIVE Final    Comment: (NOTE) SARS-CoV-2 target nucleic acids are NOT DETECTED. The SARS-CoV-2 RNA is generally detectable in upper and lower respiratory specimens during the acute phase of infection. Negative results do not  preclude SARS-CoV-2 infection, do not rule out co-infections with other pathogens, and should not be used as the sole basis for treatment or other patient management decisions. Negative results must be combined with clinical observations, patient history, and epidemiological information. The expected result is Negative. Fact Sheet for Patients: SugarRoll.be Fact Sheet for Healthcare Providers: https://www.woods-mathews.com/ This test is not yet approved or cleared by the [place] FDA and  has been authorized for detection and/or diagnosis of SARS-CoV-2 by FDA under an Emergency Use Authorization (EUA). This EUA will remain  in effect (meaning this test can be used) for the duration of the COVID-19 declaration under Section 56 4(b)(1) of the Act, 21 U.S.C. section 360bbb-3(b)(1), unless the authorization is terminated or revoked sooner. Performed at [facility] [facility], [place] [address] [place], [place] [address]      Labs: Basic Metabolic Panel: Recent Labs  Lab 01/13/20 0715 01/13/20 0841 01/14/20 0838  NA 140 137 139  K 4.1 4.1 4.7  CL 101 102 101  CO2 26  --  28  GLUCOSE 96 92 112*  BUN 16 19 16   CREATININE 1.25* 1.20 1.21  CALCIUM 9.8  --  9.4   Liver Function Tests: No results for input(s): AST, ALT, ALKPHOS, BILITOT, PROT, ALBUMIN in the last 168 hours. No results for input(s): LIPASE, AMYLASE in the last 168 hours. No results for input(s): AMMONIA in the last 168 hours. CBC: Recent Labs  Lab 01/13/20 0715 01/13/20 0841 01/14/20 0838  WBC 7.0  --  6.3  NEUTROABS  --   --  5.0  HGB 12.3* 11.9* 11.6*  HCT 39.0 35.0* 36.3*  MCV 89.7  --  88.5  PLT 229  --  257   Cardiac Enzymes: No results for input(s): CKTOTAL, CKMB, CKMBINDEX, TROPONINI in the last 168 hours. BNP: BNP (last 3 results) No results for input(s): BNP in the last 8760 hours.  ProBNP (last 3 results) No results for input(s): PROBNP in the last  8760 hours.  CBG: Recent Labs  Lab 01/13/20 0721  GLUCAP 91       Signed:  Irine Seal MD.  [facility] 01/14/2020, 4:13 PM

## 2020-01-14 NOTE — TOC Progression Note (Signed)
Transition of Care [facility]) - Progression Note    Patient Details  Name: Devin Valdez MRN: [id number] Date of Birth: 1941-04-08  Transition of Care [facility]) CM/SW [place], [place] Phone Number: 01/14/2020, 3:41 PM  Clinical Narrative:    [facility] team has requested 3-n-1 and rolling walker to be delivered to pt room. Pt recommended for OP therapies. Pt already active at OP therapies at [facility].   Expected Discharge Plan: Skilled Nursing Facility Barriers to Discharge: Continued Medical Work up  Expected Discharge Plan and Services Expected Discharge Plan: [place] In-house Referral: Clinical Social Work Discharge Planning Services: CM Consult Post Acute Care Choice: Durable Medical Equipment, Home Health, Resumption of Svcs/PTA Provider(DME; HH vs OP PT) Living arrangements for the past 2 months: Single Family Home                 DME Arranged: 3-N-1, Walker rolling DME Agency: [employer] Date DME Agency Contacted: 01/14/20 Time DME Agency Contacted: 75 Representative spoke with at DME Agency: Bethanne Ginger  Readmission Risk Interventions No flowsheet data found.

## 2020-01-14 NOTE — Progress Notes (Signed)
Pt assisted to the bathroom, and was able to urinate. Bladder scan after urinating revealed 397cc of urine in bladder. Provider Sharlet Salina made aware. Provider stated to hold on in and out coude cath and closely monitor urine output.

## 2020-01-14 NOTE — Progress Notes (Signed)
STROKE TEAM PROGRESS NOTE   INTERVAL HISTORY Patient wife at bedside.  Patient came in for worsening right lower extremity weakness and gait imbalance.  MRI showed left pontine infarct extension.  However, on examination, his both lower extremities strong.  PT/OT recommend outpatient PT/OT.  Will recommend DAPT again and avoid low BP.  Vitals:   01/13/20 1745 01/13/20 1821 01/14/20 0008 01/14/20 0509  BP: (!) 153/72 (!) 184/74 137/73 (!) 168/75  Pulse: 66 64 67 64  Resp: 19 18 18 18   Temp:  98.5 F (36.9 C) 98.1 F (36.7 C) 98.1 F (36.7 C)  TempSrc:  Oral Oral Oral  SpO2: 99% 100% 99% 100%  Weight:      Height:        CBC:  Recent Labs  Lab 01/13/20 0715 01/13/20 0841  WBC 7.0  --   HGB 12.3* 11.9*  HCT 39.0 35.0*  MCV 89.7  --   PLT 229  --     Basic Metabolic Panel:  Recent Labs  Lab 01/13/20 0715 01/13/20 0841  NA 140 137  K 4.1 4.1  CL 101 102  CO2 26  --   GLUCOSE 96 92  BUN 16 19  CREATININE 1.25* 1.20  CALCIUM 9.8  --    Lipid Panel:     Component Value Date/Time   CHOL 181 12/21/2019 0224   TRIG 45 12/21/2019 0224   HDL 55 12/21/2019 0224   CHOLHDL 3.3 12/21/2019 0224   VLDL 9 12/21/2019 0224   LDLCALC 117 (H) 12/21/2019 0224   LDLCALC 129 (H) 05/09/2019 0838   HgbA1c:  Lab Results  Component Value Date   HGBA1C 4.7 (L) 12/21/2019   Urine Drug Screen:     Component Value Date/Time   LABOPIA NONE DETECTED 01/14/2020 0328   COCAINSCRNUR NONE DETECTED 01/14/2020 0328   LABBENZ NONE DETECTED 01/14/2020 0328   AMPHETMU NONE DETECTED 01/14/2020 0328   THCU NONE DETECTED 01/14/2020 0328   LABBARB NONE DETECTED 01/14/2020 0328    Alcohol Level     Component Value Date/Time   ETH <10 01/13/2020 0755    IMAGING past 48 hours CT HEAD WO CONTRAST  Result Date: 01/13/2020 CLINICAL DATA:  Weakness. EXAM: CT HEAD WITHOUT CONTRAST TECHNIQUE: Contiguous axial images were obtained from the base of the skull through the vertex without intravenous  contrast. COMPARISON:  December 20, 2019. FINDINGS: Brain: Mild chronic ischemic white matter disease is noted. No mass effect or midline shift is noted. Ventricular size is within normal limits. There is no evidence of mass lesion, hemorrhage or acute infarction. Vascular: No hyperdense vessel or unexpected calcification. Skull: Normal. Negative for fracture or focal lesion. Sinuses/Orbits: No acute finding. Other: None. IMPRESSION: Mild chronic ischemic white matter disease. No acute intracranial abnormality seen. Electronically Signed   By: Marijo Conception M.D.   On: 01/13/2020 08:38   MR BRAIN WO CONTRAST  Result Date: 01/13/2020 CLINICAL DATA:  Focal neuro deficit.  Right-sided weakness. EXAM: MRI HEAD WITHOUT CONTRAST TECHNIQUE: Multiplanar, multiecho pulse sequences of the brain and surrounding structures were obtained without intravenous contrast. COMPARISON:  MRI head 12/20/2019 FINDINGS: Brain: Restricted diffusion left paramedian pons. This appears slightly larger compared to the prior study suggesting interval extension of infarct. No other areas of acute infarct. Generalized atrophy. Chronic microvascular ischemic changes in the white matter are mild. Vascular: Normal arterial flow voids Skull and upper cervical spine: No focal skeletal lesion. Sinuses/Orbits: Mild mucosal edema paranasal sinuses. Bilateral cataract extraction Other: None  IMPRESSION: Acute/subacute infarct left pons with mild interval increase in size since 12/20/2019 suggesting interval extension. No other areas of acute infarct. Electronically Signed   By: Franchot Gallo M.D.   On: 01/13/2020 12:59   DG Chest Port 1 View  Result Date: 01/13/2020 CLINICAL DATA:  Fatigue EXAM: PORTABLE CHEST 1 VIEW COMPARISON:  January 22, 2016 FINDINGS: Lungs are clear. Heart size and pulmonary vascularity are normal. No adenopathy. There is aortic atherosclerosis. No bone lesions. IMPRESSION: No edema or airspace opacity. Heart size normal. Aortic  Atherosclerosis (ICD10-I70.0). Electronically Signed   By: Lowella Grip III M.D.   On: 01/13/2020 07:55    PHYSICAL EXAM  Temp:  [98.1 F (36.7 C)-98.5 F (36.9 C)] 98.4 F (36.9 C) (02/23 1211) Pulse Rate:  [64-74] 65 (02/23 1211) Resp:  [15-25] 16 (02/23 1211) BP: (137-184)/(72-81) 147/74 (02/23 1211) SpO2:  [99 %-100 %] 100 % (02/23 1211)  General - Well nourished, well developed, in no apparent distress.  Ophthalmologic - fundi not visualized due to noncooperation.  Cardiovascular - Regular rhythm and rate.  Mental Status -  Level of arousal and orientation to time, place, and person were intact. Language including expression, naming, repetition, comprehension was assessed and found intact. Fund of Knowledge was assessed and was intact.  Cranial Nerves II - XII - II - Visual field intact OU. III, IV, VI - Extraocular movements intact. V - Facial sensation intact bilaterally. VII - Facial movement intact bilaterally. VIII - Hearing & vestibular intact bilaterally. X - Palate elevates symmetrically. XI - Chin turning & shoulder shrug intact bilaterally. XII - Tongue protrusion intact.  Motor Strength - The patient's strength was symmetrical in all extremities except slight  RLE proximal weakness 5-/5, and pronator drift was absent.  Bulk was normal and fasciculations were absent.   Motor Tone - Muscle tone was assessed at the neck and appendages and was normal.  Reflexes - The patient's reflexes were symmetrical in all extremities and he had no pathological reflexes.  Sensory - Light touch, temperature/pinprick were assessed and were symmetrical.    Coordination - The patient had normal movements in the hands with no ataxia or dysmetria.  Tremor was absent.  Gait and Station - deferred.   ASSESSMENT/PLAN [name] is a 79 y.o. 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   Stroke:  Mild extension of L pontine infarct from small vessel disease 11/2019 in the setting of ? dehydration  CT head No acute abnormality. Mild chronic Small vessel disease.   MRI  Acute/subacute L pontine infarct w/ mild increase in size since 12/20/2019  LDL 117 (11/2019)  HgbA1c 4.7 (11/2019)  Lovenox 30 mg sq daily for VTE prophylaxis  aspirin 81 mg daily and clopidogrel 75 mg daily x 3 wks beginning 12/21/2019 prior to admission, now on aspirin 81 mg daily and clopidogrel 75 mg daily. Continue DAPT x 3 weeks and then plavix alone.  Therapy recommendations:  outpt PT  Disposition:  home   Hx stroke/TIA  12/20/2019 - Acute brainstem infarct in the left paracentral pons - likely due to small vessel disease - pt reported left sided weakness for 2 days though, exam no weakness found.  MRA and carotid Doppler unremarkable, EF 55 to 60%.  LDL 117 and A1c 4.7.  Put on DAPT x 3 weeks.  Started on lipitor. No  therapy needs.  Discharged home.  Hypertension  Stable . Gradually normalize in 2-3 days . Long-term BP goal normotensive . Avoid low BP, avoid dehydration  Hyperlipidemia  Home meds:  lipitor 40, resumed in hospital  LDL 117, goal < 70  Continue statin at discharge  Other Stroke Risk Factors  Advanced age  Former Cigarette smoker, quit 30 yrs ago  Other Active Problems  Orthostatic hypotension in 2018 - followed by DR. Sethi  Difficulty urinating   Hospital day # 0  Neurology will sign off. Please call with questions. Pt will follow up with stroke clinic Dr. Leonie Man at [facility] on 2/25. Thanks for the consult.  Rosalin Hawking, MD PhD Stroke Neurology 01/14/2020 4:08 PM  To contact Stroke Continuity provider, please refer to [email or web address] After hours, contact General Neurology

## 2020-01-15 ENCOUNTER — Ambulatory Visit (HOSPITAL_COMMUNITY): Payer: Medicare Other | Admitting: Physical Therapy

## 2020-01-16 ENCOUNTER — Encounter: Payer: Self-pay | Admitting: Neurology

## 2020-01-16 ENCOUNTER — Telehealth: Payer: Self-pay | Admitting: Neurology

## 2020-01-16 ENCOUNTER — Other Ambulatory Visit: Payer: Self-pay

## 2020-01-16 ENCOUNTER — Ambulatory Visit (INDEPENDENT_AMBULATORY_CARE_PROVIDER_SITE_OTHER): Payer: Medicare Other | Admitting: Neurology

## 2020-01-16 ENCOUNTER — Other Ambulatory Visit: Payer: Self-pay | Admitting: Family Medicine

## 2020-01-16 VITALS — Temp 97.4°F | Ht 67.0 in | Wt 139.6 lb

## 2020-01-16 DIAGNOSIS — I6389 Other cerebral infarction: Secondary | ICD-10-CM | POA: Diagnosis not present

## 2020-01-16 DIAGNOSIS — I6381 Other cerebral infarction due to occlusion or stenosis of small artery: Secondary | ICD-10-CM

## 2020-01-16 DIAGNOSIS — G8191 Hemiplegia, unspecified affecting right dominant side: Secondary | ICD-10-CM | POA: Diagnosis not present

## 2020-01-16 DIAGNOSIS — I639 Cerebral infarction, unspecified: Secondary | ICD-10-CM

## 2020-01-16 DIAGNOSIS — I6350 Cerebral infarction due to unspecified occlusion or stenosis of unspecified cerebral artery: Secondary | ICD-10-CM

## 2020-01-16 MED ORDER — COENZYME Q10 30 MG PO CAPS: 200.0000 mg | ORAL_CAPSULE | Freq: Three times a day (TID) | ORAL | 1 refills | Status: DC

## 2020-01-16 NOTE — Progress Notes (Signed)
[facility] [address] [place]. [place] [address] [phone]       OFFICE CONSULT NOTE  [name] Date of Birth:  16-Sep-1941 Medical Record Number:  [id number]   Referring MD:  Devin Valdez Reason for Referral: stroke  HPI: [name] is a 79 year old African-American male with past medical history of hypertension, hyperlipidemia, depression, cervical spinal canal stenosis seen today for initial office consultation visit for stroke.  History is obtained from patient, review of electronic medical records and I personally reviewed imaging films in PACS.  Patient initially presented in January 2021 with right-sided weakness for 2 days prior to admission.  MRI scan showed a left paracentral pontine lacunar infarct and work-up at that time showed no significant extracranial stenosis on carotid Dopplers and an MRI of the brain.  2D echo showed normal ejection fraction.  LDL was 117 mg percent and hemoglobin A1c was 4.7.  Patient was started on Lipitor and discharged on dual antiplatelet therapy for 3 weeks.  He has no physical therapy needs and was discharged home and was doing well.  He was readmitted on 01/13/2020 with worsening symptoms in the setting of possible dehydration and MRI scan showed extension of the left pontine paramedian infarct.  Patient was asked to continue dual antiplatelet therapy for another 3 weeks and then Plavix alone and stroke work-up was not repeated.  Patient states that he is doing well he is currently doing home physical and occupational therapy.  He still has mild weakness on the right side but is getting better.  Is ambulating with a cane.  He has had no falls or injuries.  Is tolerating aspirin and Plavix without bruising or bleeding and Lipitor without muscle aches and pains.  His blood pressure is well controlled today it is 113/6 7.  He has no complaints.  ROS:   14 system review of systems is positive for weakness, gait difficulty,  slurred speech, imbalance tremor and all other systems negative  PMH:  Past Medical History:  Diagnosis Date  . Anemia   . Anxiety   . 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 Depression   . Falls   . Hallucinations   . Hyperlipidemia   . Hypertension   . Stroke [facility])    left paracentral pons infarct    Social History:  Social History   Socioeconomic History  . Marital status: Married    Spouse name: Not on file  . Number of children: Not on file  . Years of education: Not on file  . Highest education level: Not on file  Occupational History  . Not on file  Tobacco Use  . Smoking status: Former Smoker    Quit date: 11/21/1985    Years since quitting: 34.1  . Smokeless tobacco: Never Used  Substance and Sexual Activity  . Alcohol use: No  . Drug use: No  . Sexual activity: Yes  Other Topics Concern  . Not on file  Social History Narrative  . Not on file   Social Determinants of Health   Financial Resource Strain:   . Difficulty of Paying Living Expenses: Not on file  Food Insecurity:   . Worried About Charity fundraiser in the Last Year: Not on file  . Ran Out of Food in the Last Year: Not on file  Transportation Needs:   . Lack of Transportation (Medical): Not on file  . Lack of Transportation (Non-Medical): Not on  file  Physical Activity:   . Days of Exercise per Week: Not on file  . Minutes of Exercise per Session: Not on file  Stress:   . Feeling of Stress : Not on file  Social Connections:   . Frequency of Communication with Friends and Family: Not on file  . Frequency of Social Gatherings with Friends and Family: Not on file  . Attends Religious Services: Not on file  . Active Member of Clubs or Organizations: Not on file  . Attends Archivist Meetings: Not on file  . Marital Status: Not on file  Intimate Partner Violence:   . Fear of Current or Ex-Partner: Not on file  . Emotionally Abused: Not on file  . Physically  Abused: Not on file  . Sexually Abused: Not on file    Medications:   Current Outpatient Medications on File Prior to Visit  Medication Sig Dispense Refill  . acetaminophen (TYLENOL) 325 MG tablet Take 650 mg by mouth every 6 (six) hours as needed for mild pain or headache.    Marland Kitchen aspirin 81 MG EC tablet Take 1 tablet (81 mg total) by mouth daily for 21 days. 30 tablet 0  . clopidogrel (PLAVIX) 75 MG tablet Take 1 tablet (75 mg total) by mouth daily. 30 tablet 2  . ferrous sulfate 325 (65 FE) MG tablet Take 1 tablet (325 mg total) by mouth 2 (two) times daily with a meal. (Patient taking differently: Take 325 mg by mouth daily with breakfast. ) 60 tablet 1  . losartan (COZAAR) 50 MG tablet TAKE ONE (1) TABLET BY MOUTH EVERY DAY FOR BLOOD PRESSURE (Patient taking differently: Take 50 mg by mouth daily. ) 90 tablet 3  . Multiple Vitamins-Minerals (CENTRUM SILVER ADULT 50+) TABS Take 1 capsule by mouth 2 (two) times daily. 30 tablet 2  . sildenafil (VIAGRA) 100 MG tablet TAKE ONE TABLET (100MG  TOTAL) BY MOUTH DAILY AS NEEDED FOR ERECTILE DYSFUNCTION (Patient taking differently: Take 100 mg by mouth as needed for erectile dysfunction. ) 10 tablet 5   No current facility-administered medications on file prior to visit.    Allergies:  No Known Allergies  Physical Exam General: Frail elderly African-American male not in distress., seated, in no evident distress Head: head normocephalic and atraumatic.   Neck: supple with no carotid or supraclavicular bruits Cardiovascular: regular rate and rhythm, no murmurs Musculoskeletal: no deformity Skin:  no rash/petichiae Vascular:  Normal pulses all extremities  Neurologic Exam Mental Status: Awake and fully alert. Oriented to place and time. Recent and remote memory intact. Attention span, concentration and fund of knowledge appropriate. Mood and affect appropriate.  Cranial Nerves: Fundoscopic exam reveals sharp disc margins. Pupils equal, briskly  reactive to light. Extraocular movements full without nystagmus. Visual fields full to confrontation. Hearing intact. Facial sensation intact. Face, tongue, palate moves normally and symmetrically.  Motor: Normal bulk and tone. Normal strength in all tested extremity muscles.  Mild weakness of right grip and intrinsic hand muscles.  Orbits left over right upper extremity.  Fine finger movements are diminished on the left.  Mild weakness of right hip flexor and ankle dorsiflexors.  Intermittent resting pill-rolling tremor of the left hand.  Mild cogwheel rigidity upon activation only in the left hand.  No bradykinesia.  Negative glabellar tap. Sensory.: intact to touch , pinprick , position and vibratory sensation.  Coordination: Mildly impaired on the right and normal on the left. Gait and Station: Arises from chair without difficulty. Stance is  normal. Gait demonstrates normal stride length and balance .  Slight dragging of the right leg while walking.  Unable to walk tandem.  Uses a cane. Reflexes: 1+ and asymmetric and slightly brisker on the right. Toes downgoing.   NIHSS  1 Modified Rankin  2   ASSESSMENT: 79 year old African-American male with left paracentral pontine lacunar infarct in January 2021 with right hemiparesis and subsequent readmission for worsening symptoms in February 2021 due to extension of left pontine infarct from small vessel disease.  Vascular risk factors of hypertension, hyperlipidemia and age     PLAN: I had a long d/w patient and his wife about his recent recurrent Pontine lacunar strokes , risk for recurrent stroke/TIAs, personally independently reviewed imaging studies and stroke evaluation results and answered questions.Continue aspirin 81 mg daily and clopidogrel 75 mg daily  for 3 weeks and then stop aspirin and stay on clopidogrel alone for secondary stroke prevention and maintain strict control of hypertension with blood pressure goal below 130/90, diabetes with  hemoglobin A1c goal below 6.5% and lipids with LDL cholesterol goal below 70 mg/dL. I also advised the patient to eat a healthy diet with plenty of whole grains, cereals, fruits and vegetables, exercise regularly and maintain ideal body weight.  Continue ongoing physical and occupational therapy and use cane at all times for ambulation.  Add coenzyme Q 10 200 mg daily to reduce statin myalgias.  Greater than 50% time during this 50-minute consultation visit was spent on counseling and coordination of care about his pontine stroke and answering questions followup in the future with my nurse practitioner Janett Billow in 3 months or call earlier if necessary. Antony Contras, MD  [facility] [address] [place], [place] [address]  Phone [phone] Fax [phone] Note: This document was prepared with digital dictation and possible smart phrase technology. Any transcriptional errors that result from this process are unintentional.

## 2020-01-16 NOTE — Telephone Encounter (Signed)
I called pharmacy that per Dr.SEthi order for CO q 10. I state the note states 200mg  of COq10 daily over the counter. I stated pt and wife will be call.

## 2020-01-16 NOTE — Telephone Encounter (Signed)
co-enzyme Q-10 30 MG capsule  Pharmacy rep Calling in regards the above named prescription and to get further clarification  please FU

## 2020-01-16 NOTE — Patient Instructions (Signed)
I had a long d/w patient and his wife about his recent recurrent Pontine lacunar strokes , risk for recurrent stroke/TIAs, personally independently reviewed imaging studies and stroke evaluation results and answered questions.Continue aspirin 81 mg daily and clopidogrel 75 mg daily  for 3 weeks and then stop aspirin and stay on clopidogrel alone for secondary stroke prevention and maintain strict control of hypertension with blood pressure goal below 130/90, diabetes with hemoglobin A1c goal below 6.5% and lipids with LDL cholesterol goal below 70 mg/dL. I also advised the patient to eat a healthy diet with plenty of whole grains, cereals, fruits and vegetables, exercise regularly and maintain ideal body weight.  Continue ongoing physical and occupational therapy and use cane at all times for ambulation.  Add coenzyme Q 10 200 mg daily to reduce statin myalgias.  Followup in the future with my nurse practitioner Janett Billow in 3 months or call earlier if necessary.  Stroke Prevention Some medical conditions and behaviors are associated with a higher chance of having a stroke. You can help prevent a stroke by making nutrition, lifestyle, and other changes, including managing any medical conditions you may have. What nutrition changes can be made?   Eat healthy foods. You can do this by: ? Choosing foods high in fiber, such as fresh fruits and vegetables and whole grains. ? Eating at least 5 or more servings of fruits and vegetables a day. Try to fill half of your plate at each meal with fruits and vegetables. ? Choosing lean protein foods, such as lean cuts of meat, poultry without skin, fish, tofu, beans, and nuts. ? Eating low-fat dairy products. ? Avoiding foods that are high in salt (sodium). This can help lower blood pressure. ? Avoiding foods that have saturated fat, trans fat, and cholesterol. This can help prevent high cholesterol. ? Avoiding processed and premade foods.  Follow your health care  provider's specific guidelines for losing weight, controlling high blood pressure (hypertension), lowering high cholesterol, and managing diabetes. These may include: ? Reducing your daily calorie intake. ? Limiting your daily sodium intake to 1,500 milligrams (mg). ? Using only healthy fats for cooking, such as olive oil, canola oil, or sunflower oil. ? Counting your daily carbohydrate intake. What lifestyle changes can be made?  Maintain a healthy weight. Talk to your health care provider about your ideal weight.  Get at least 30 minutes of moderate physical activity at least 5 days a week. Moderate activity includes brisk walking, biking, and swimming.  Do not use any products that contain nicotine or tobacco, such as cigarettes and e-cigarettes. If you need help quitting, ask your health care provider. It may also be helpful to avoid exposure to secondhand smoke.  Limit alcohol intake to no more than 1 drink a day for nonpregnant women and 2 drinks a day for men. One drink equals 12 oz of beer, 5 oz of wine, or 1 oz of hard liquor.  Stop any illegal drug use.  Avoid taking birth control pills. Talk to your health care provider about the risks of taking birth control pills if: ? You are over 55 years old. ? You smoke. ? You get migraines. ? You have ever had a blood clot. What other changes can be made?  Manage your cholesterol levels. ? Eating a healthy diet is important for preventing high cholesterol. If cholesterol cannot be managed through diet alone, you may also need to take medicines. ? Take any prescribed medicines to control your cholesterol as told  by your health care provider.  Manage your diabetes. ? Eating a healthy diet and exercising regularly are important parts of managing your blood sugar. If your blood sugar cannot be managed through diet and exercise, you may need to take medicines. ? Take any prescribed medicines to control your diabetes as told by your health  care provider.  Control your hypertension. ? To reduce your risk of stroke, try to keep your blood pressure below 130/80. ? Eating a healthy diet and exercising regularly are an important part of controlling your blood pressure. If your blood pressure cannot be managed through diet and exercise, you may need to take medicines. ? Take any prescribed medicines to control hypertension as told by your health care provider. ? Ask your health care provider if you should monitor your blood pressure at home. ? Have your blood pressure checked every year, even if your blood pressure is normal. Blood pressure increases with age and some medical conditions.  Get evaluated for sleep disorders (sleep apnea). Talk to your health care provider about getting a sleep evaluation if you snore a lot or have excessive sleepiness.  Take over-the-counter and prescription medicines only as told by your health care provider. Aspirin or blood thinners (antiplatelets or anticoagulants) may be recommended to reduce your risk of forming blood clots that can lead to stroke.  Make sure that any other medical conditions you have, such as atrial fibrillation or atherosclerosis, are managed. What are the warning signs of a stroke? The warning signs of a stroke can be easily remembered as BEFAST.  B is for balance. Signs include: ? Dizziness. ? Loss of balance or coordination. ? Sudden trouble walking.  E is for eyes. Signs include: ? A sudden change in vision. ? Trouble seeing.  F is for face. Signs include: ? Sudden weakness or numbness of the face. ? The face or eyelid drooping to one side.  A is for arms. Signs include: ? Sudden weakness or numbness of the arm, usually on one side of the body.  S is for speech. Signs include: ? Trouble speaking (aphasia). ? Trouble understanding.  T is for time. ? These symptoms may represent a serious problem that is an emergency. Do not wait to see if the symptoms will go  away. Get medical help right away. Call your local emergency services (911 in the [place] Do not drive yourself to the hospital.  Other signs of stroke may include: ? A sudden, severe headache with no known cause. ? Nausea or vomiting. ? Seizure. Where to find more information For more information, visit:  American Stroke Association: www.strokeassociation.org  National Stroke Association: www.stroke.org Summary  You can prevent a stroke by eating healthy, exercising, not smoking, limiting alcohol intake, and managing any medical conditions you may have.  Do not use any products that contain nicotine or tobacco, such as cigarettes and e-cigarettes. If you need help quitting, ask your health care provider. It may also be helpful to avoid exposure to secondhand smoke.  Remember BEFAST for warning signs of stroke. Get help right away if you or a loved one has any of these signs. This information is not intended to replace advice given to you by your health care provider. Make sure you discuss any questions you have with your health care provider. Document Revised: 10/20/2017 Document Reviewed: 12/13/2016 Elsevier Patient Education  2020 [employer].

## 2020-01-16 NOTE — Telephone Encounter (Signed)
I called pts wife that the coq 10 200mg  is otc. PT wife verbalized understanding.

## 2020-01-17 ENCOUNTER — Other Ambulatory Visit: Payer: Self-pay

## 2020-01-17 ENCOUNTER — Encounter (HOSPITAL_COMMUNITY): Payer: Self-pay | Admitting: Physical Therapy

## 2020-01-17 ENCOUNTER — Ambulatory Visit (HOSPITAL_COMMUNITY): Payer: Medicare Other | Admitting: Physical Therapy

## 2020-01-17 DIAGNOSIS — M6281 Muscle weakness (generalized): Secondary | ICD-10-CM | POA: Diagnosis not present

## 2020-01-17 DIAGNOSIS — R2689 Other abnormalities of gait and mobility: Secondary | ICD-10-CM | POA: Diagnosis not present

## 2020-01-17 DIAGNOSIS — R29818 Other symptoms and signs involving the nervous system: Secondary | ICD-10-CM

## 2020-01-17 NOTE — Therapy (Signed)
[facility] [address] [place], [place], [address] Phone: [phone]   Fax:  [phone]  Physical Therapy Treatment / Re-assessment  Patient Details  Name: Devin Valdez MRN: [id number] Date of Birth: [birth year] Referring Provider (PT): [name]   Encounter Date: 01/17/2020   Progress Note Reporting Period 12/27/19 to 02/07/20 See note below for Objective Data and Assessment of Progress/Goals.       PT End of Session - 01/17/20 1331    Visit Number  5    Number of Visits  12    Date for PT Re-Evaluation  02/07/20    Authorization Type  Primary: Medicare; Secondary: [employer]    Authorization Time Period  12/27/19 - 02/07/20    Authorization - Visit Number  1    Authorization - Number of Visits  10    PT Start Time  1306    PT Stop Time  1345    PT Time Calculation (min)  39 min    Equipment Utilized During Treatment  Gait belt    Activity Tolerance  Patient tolerated treatment well    Behavior During Therapy  WFL for tasks assessed/performed       Past Medical History:  Diagnosis Date  . Anemia   . Anxiety   . 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 Depression   . Falls   . Hallucinations   . Hyperlipidemia   . Hypertension   . Stroke [facility])    left paracentral pons infarct    Past Surgical History:  Procedure Laterality Date  . EYE SURGERY    . HERNIA REPAIR    . POSTERIOR CERVICAL LAMINECTOMY     with fusion C3-C7 10/19- Dr. Brigitte Pulse at [facility]    There were no vitals filed for this visit.  Subjective Assessment - 01/17/20 1311    Subjective  Patient denied any pain. Reported having been in the hospital due to an exentsion of his stroke on 01/13/20. He reported having home health therapy following this for about 2 days.    Pertinent History  S/P Left Pontine Artery CVA; Extension of stroke 01/13/20    Limitations  Walking;House hold activities    Diagnostic tests  MRI 12/20/19: Acute  brainstem infarct; MRI 01/13/20: extension of stroke    Patient Stated Goals  Patient wants to have his balance equal and improve his walking    Currently in Pain?  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    Referring Provider (PT)  [name]      Strength   Right Hip Flexion  4-/5   was 4-   Right Hip ABduction  4-/5   Tested in sitting   Left Hip Flexion  4/5   was 4+   Left Hip ABduction  4+/5   was 4+   Right Knee Flexion  4/5   was 4-   Right Knee Extension  5/5   was 5   Left Knee Flexion  5/5   was 5   Left Knee Extension  5/5   was 5   Right Ankle Dorsiflexion  4-/5   was 4-   Left Ankle Dorsiflexion  5/5   was 5     Dynamic Gait Index   Level Surface  Mild  Impairment    Change in Gait Speed  Mild Impairment    Gait with Horizontal Head Turns  Mild Impairment    Gait with Vertical Head Turns  Moderate Impairment    Gait and Pivot Turn  Mild Impairment    Step Over Obstacle  Mild Impairment    Step Around Obstacles  Mild Impairment    Steps  Mild Impairment    Total Score  15    DGI comment:  was 14                   [facility] Adult PT Treatment/Exercise - 01/17/20 0001      Knee/Hip Exercises: Standing   Heel Raises  Both;2 sets;10 reps    Heel Raises Limitations  Toe raises 2 x 10 both    Other Standing Knee Exercises  Tandem ambulation on a line 15' x 4 with CGA.     Other Standing Knee Exercises  Stepping over (3)6-inch hurdles in // bars forward x2             PT Education - 01/17/20 1331    Education Details  DIscussed re-assessment findings.    Person(s) Educated  Patient    Methods  Explanation    Comprehension  Verbalized understanding       PT Short Term Goals - 01/01/20 1042      PT SHORT TERM GOAL #1   Title  Patient will report understanding and regular compliance with HEP to improve strength, balance, and overall  functional mobility.    Time  3    Period  Weeks    Status  On-going    Target Date  01/17/20        PT Long Term Goals - 01/17/20 1344      PT LONG TERM GOAL #1   Title  Patient will demonstrate ability to perform sit to stand without upper extremity assistance indicating improved functional strength and balance.    Time  6    Period  Weeks    Status  Achieved      PT LONG TERM GOAL #2   Title  Patient will demonstrate improvement of at least 3 points on the DGI indicating improved balance for improved safety with functional mobility.    Baseline  01/17/20: See objective measures    Time  6    Period  Weeks    Status  On-going      PT LONG TERM GOAL #3   Title  Patient will report improvement in endurance and functional mobility of at least 50%.    Baseline  01/17/20: Patient reported a decrease in endurance since extension of his stroke    Time  6    Period  Weeks    Status  On-going      PT LONG TERM GOAL #4   Title  Patient will ascend and descend at least 4 7'' steps with mod I in order to perform stair negotiation safely at home.    Baseline  01/17/20: Patient required CGA    Time  6    Period  Weeks    Status  On-going            Plan - 01/17/20 1809    Clinical Impression Statement  Patient returned to therapy following an extension of his stroke on 01/13/20. Patient's blood pressure was obtained and found to be 150/76 without reports of any complaints. Performed a re-assessment as patient had had a change in  status. Patient's strength had remained relatively unchanged as had his balance since the first initial evaluation, however, patient did demonstrate decreased endurance and reported decreased endurance at home as well. Patient required increased rest breaks in between exercises this session however, was able to resume dynamic balance activities similar to before patient's extension of stroke with fewer repetitions or distance. Patient would benefit from  continued skilled physical therapy to continue progressing towards functional goals.    Personal Factors and Comorbidities  Age;Comorbidity 3+    Comorbidities  CVA, cervical stenosis, depression    Examination-Activity Limitations  Bathing;Stand;Locomotion Level;Bed Mobility;Transfers;Stairs    Examination-Participation Restrictions  Yard Work;Cleaning;Meal Prep;Community Activity    Stability/Clinical Decision Making  Stable/Uncomplicated    Rehab Potential  Good    PT Frequency  2x / week    PT Duration  6 weeks    PT Treatment/Interventions  ADLs/Self Care Home Management;Aquatic Therapy;Cryotherapy;Electrical Stimulation;Moist Heat;DME Instruction;Gait training;Stair training;Functional mobility training;Therapeutic activities;Therapeutic exercise;Balance training;Neuromuscular re-education;Patient/family education;Orthotic Fit/Training;Manual techniques;Passive range of motion    PT Next Visit Plan  Take blood pressure at beginning of each session and as needed. Focus on gait training and dynamic balance with a focus on increasing patient's endurance.    PT Home Exercise Plan  01/01/20:  bridges, SLR, seated toeraises and LAQ; 01/10/20: Tandem stance balance each LE forward daily    Consulted and Agree with Plan of Care  Patient       Patient will benefit from skilled therapeutic intervention in order to improve the following deficits and impairments:  Abnormal gait, Decreased knowledge of use of DME, Improper body mechanics, Decreased coordination, Decreased mobility, Postural dysfunction, Decreased activity tolerance, Decreased endurance, Decreased strength, Decreased balance, Difficulty walking  Visit Diagnosis: Other symptoms and signs involving the nervous system  Other abnormalities of gait and mobility  Muscle weakness (generalized)     Problem List Patient Active Problem List   Diagnosis Date Noted  . 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[place]) 01/13/2020  . Acute right-sided weakness  01/13/2020  . Difficulty urinating 01/13/2020  . Stroke ([place])   . CVA (cerebral vascular accident) ([place]) 12/20/2019  . 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[place])   . PVC (premature ventricular contraction)   . Cervical stenosis of spinal canal   . Failure to thrive in adult 05/26/2017  . Failure to thrive (0-17) 05/25/2017  . Abdominal pulsatile mass 05/25/2017  . Anemia 05/25/2017  . Essential hypertension 05/25/2017  . Gait instability 05/20/2017  . Frequent falls 05/20/2017  . Depression 05/20/2017   Devin Valdez PT, DPT 6:12 PM, 01/17/20 [facility] [address] [place], [place], [address] Phone: [phone]   Fax:  [phone]  Name: Devin Valdez MRN: [id number] Date of Birth: 1941/03/12

## 2020-01-20 ENCOUNTER — Ambulatory Visit (INDEPENDENT_AMBULATORY_CARE_PROVIDER_SITE_OTHER): Payer: Medicare Other | Admitting: Family Medicine

## 2020-01-20 ENCOUNTER — Encounter: Payer: Self-pay | Admitting: Family Medicine

## 2020-01-20 ENCOUNTER — Other Ambulatory Visit: Payer: Self-pay

## 2020-01-20 VITALS — BP 134/82 | HR 72 | Temp 97.4°F | Resp 12 | Ht 67.0 in | Wt 136.0 lb

## 2020-01-20 DIAGNOSIS — I6322 Cerebral infarction due to unspecified occlusion or stenosis of basilar arteries: Secondary | ICD-10-CM | POA: Diagnosis not present

## 2020-01-20 DIAGNOSIS — I6329 Cerebral infarction due to unspecified occlusion or stenosis of other precerebral arteries: Secondary | ICD-10-CM

## 2020-01-20 DIAGNOSIS — I6389 Other cerebral infarction: Secondary | ICD-10-CM

## 2020-01-20 DIAGNOSIS — N183 Chronic kidney disease, stage 3 unspecified: Secondary | ICD-10-CM

## 2020-01-20 NOTE — Progress Notes (Signed)
Subjective:    Patient ID: Devin Valdez, male    DOB: 25-Jan-1941, 79 y.o.   MRN: [id number]  HPI Recently admitted to the hospital after extension of his previous stroke.  I have copied relevant portions of his discharge summary below and included them for my reference:   Admit date: 01/13/2020 Discharge date: 01/14/2020   Recommendations for Outpatient Follow-up:  1. Follow-up with Dr. Leonie Man, as previously scheduled on 01/16/2020. 2. Follow-up with [name], MD on 01/20/2020.  Patient's blood pressure will need to be reassessed on follow-up and per neurology goal was to keep systolic blood pressure greater than 140.  Patient will need a basic metabolic profile done on follow-up to follow-up on electrolytes and renal function.  Patient has difficulty urinating will need to be followed up upon.   Discharge Diagnoses:  Principal Problem:   Extension of stroke [facility]) Active Problems:   Gait instability   Acute right-sided weakness   Difficulty urinating  History of present illness:  HPI per Dr. Evangeline Gula Ahsan P [name] a 78 y.o.malewith medical history significant ofhypertension, anemia of chronic renal failure, chronic kidney disease stage IV, and hypertension who had a stroke in January 2021 who presents to the emergency department complaining of bilateral lower extremity weakness. The extremity weakness has been worsening over the past 48 hours. He was recently in the hospital and had the same deficit. He feels like his stroke has reoccurred. After discharge he went home with outpatient physical therapy and has completed 3 weeks of therapy. He was started on aspirin and Plavix for 3 weeks. He completed his 3 weeks of Plavix. He has follow-up with the neurologist tomorrow and then was sitting further rehabilitation. Has had no fever, nausea, cough, congestion, vomiting, or diarrhea. Is been drinking a little less over the past 24 hours but cannot quantify. He  does not have any abdominal pain or blood in his stools. He takes iron and has chronically dark stools. Severity of his symptoms were moderate onset was gradual. It is been present for 2 days and constant. He feels that it has been worsening. There is a recurrence he of the symptoms. Nothing has made it better nothing has made it worse. He has no associated symptoms as noted above. Neurology was consulted by the emergency department who recommended getting an MRI of the brain. 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 Patient complains of inability to make urine which was not a problem before. Patient thinks he has not been drinking much water and may be dehydrated. Stroke extensions usually occur due to hypotension but patient does not recall any episodes of feeling dizzy or hypotensive or passing out. Patient was discharged on 12/21/2019 on dual antiplatelet therapy. He received outpatient physical therapy at [facility]. Neurology diagnosed the patient with an extension of existing recent pontine stroke and recommended restarting dual antiplatelet therapy and observation.  Hospital Course:  1 extension of CVA Patient noted to have an extension of his stroke as he had presented with worsening lower extremity weakness MRI of the head was consistent with extension of a pontine stroke.  Patient denied any hypotensive spells, nausea or vomiting.  Patient was seen by PT /OT and ST and recommendations were for outpatient therapy.  Patient noted to have an LDL of 117 on January 2021 during last hospitalization.  Neurology was consulted and patient seen in consultation by Dr. [name] who recommended continuation of dual antiplatelet therapy of aspirin and Plavix, for 3  weeks, and then Plavix daily.  It was felt no need to repeat stroke work-up and to keep systolic blood pressure greater than 140s.  Patient remained in stable condition.  Patient will be discharged in stable improved condition and is  to follow-up with Dr. Leonie Man, neurology as previously scheduled on 01/16/2020.  2.  Hypertension Remained stable.  Patient maintained on home regimen of Cozaar.  3.  Gait instability Secondary to problem #1.  Patient was seen by PT/OT.  Outpatient PT recommended.  4.  Difficulty urinating On admission patient did have some complaints of difficulty urinating however did state had improved by day of discharge.  Procedures:  MRI 01/13/2020  Consultations:  Neurology: Dr.Arora 01/13/2020 Patient is here today for follow-up with his wife.  He states that prior to going to the hospital he was not drinking very much.  He was having a difficult time urinating.  He believes he was dehydrated.  This caused his blood pressure dropped and he believes this is what led to extension of his CVA.  Since discharge from the hospital, the patient states that he is doing better.  He still feels extremely weak and tired.  He occasionally slurs his speech.  He denies choking after eating or drinking.  He denies any falls.  He does have some slight deficiency in his balance with some mild right-sided weakness in his arms and his legs but otherwise is improving.  He is working with physical therapy.  His blood pressure is in the 382-505 range systolic.  His wife denies any hypotensive episodes since discharge from the hospital.  He is eating and drinking well.  Past Medical History:  Diagnosis Date  . Anemia   . Anxiety   . 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 Depression   . Falls   . Hallucinations   . Hyperlipidemia   . Hypertension   . Stroke [facility])    left paracentral pons infarct   Past Surgical History:  Procedure Laterality Date  . EYE SURGERY    . HERNIA REPAIR    . POSTERIOR CERVICAL LAMINECTOMY     with fusion C3-C7 10/19- Dr. Brigitte Pulse at [facility]   Current Outpatient Medications on File Prior to Visit  Medication Sig Dispense Refill  . acetaminophen (TYLENOL) 325 MG  tablet Take 650 mg by mouth every 6 (six) hours as needed for mild pain or headache.    Marland Kitchen aspirin 81 MG EC tablet Take 1 tablet (81 mg total) by mouth daily for 21 days. 30 tablet 0  . atorvastatin (LIPITOR) 40 MG tablet TAKE ONE TABLET (40MG  TOTAL) BY MOUTH DAILY AT 6PM 30 tablet 0  . clopidogrel (PLAVIX) 75 MG tablet Take 1 tablet (75 mg total) by mouth daily. 30 tablet 2  . Coenzyme Q10 (CO Q-10) 200 MG CAPS Take by mouth daily.    . ferrous sulfate 325 (65 FE) MG tablet Take 1 tablet (325 mg total) by mouth 2 (two) times daily with a meal. (Patient taking differently: Take 325 mg by mouth daily with breakfast. ) 60 tablet 1  . losartan (COZAAR) 50 MG tablet TAKE ONE (1) TABLET BY MOUTH EVERY DAY FOR BLOOD PRESSURE (Patient taking differently: Take 50 mg by mouth daily. ) 90 tablet 3  . Multiple Vitamins-Minerals (CENTRUM SILVER ADULT 50+) TABS Take 1 capsule by mouth 2 (two) times daily. 30 tablet 2  . sildenafil (VIAGRA) 100 MG tablet TAKE ONE TABLET (100MG  TOTAL) BY MOUTH  DAILY AS NEEDED FOR ERECTILE DYSFUNCTION (Patient taking differently: Take 100 mg by mouth as needed for erectile dysfunction. ) 10 tablet 5   No current facility-administered medications on file prior to visit.   No Known Allergies Social History   Socioeconomic History  . Marital status: Married    Spouse name: Not on file  . Number of children: Not on file  . Years of education: Not on file  . Highest education level: Not on file  Occupational History  . Not on file  Tobacco Use  . Smoking status: Former Smoker    Quit date: 11/21/1985    Years since quitting: 34.1  . Smokeless tobacco: Never Used  Substance and Sexual Activity  . Alcohol use: No  . Drug use: No  . Sexual activity: Yes  Other Topics Concern  . Not on file  Social History Narrative  . Not on file   Social Determinants of Health   Financial Resource Strain:   . Difficulty of Paying Living Expenses: Not on file  Food Insecurity:   .  Worried About Charity fundraiser in the Last Year: Not on file  . Ran Out of Food in the Last Year: Not on file  Transportation Needs:   . Lack of Transportation (Medical): Not on file  . Lack of Transportation (Non-Medical): Not on file  Physical Activity:   . Days of Exercise per Week: Not on file  . Minutes of Exercise per Session: Not on file  Stress:   . Feeling of Stress : Not on file  Social Connections:   . Frequency of Communication with Friends and Family: Not on file  . Frequency of Social Gatherings with Friends and Family: Not on file  . Attends Religious Services: Not on file  . Active Member of Clubs or Organizations: Not on file  . Attends Archivist Meetings: Not on file  . Marital Status: Not on file  Intimate Partner Violence:   . Fear of Current or Ex-Partner: Not on file  . Emotionally Abused: Not on file  . Physically Abused: Not on file  . Sexually Abused: Not on file    Review of Systems  All other systems reviewed and are negative.      Objective:   Physical Exam Constitutional:      Appearance: Normal appearance. He is normal weight.  Cardiovascular:     Rate and Rhythm: Normal rate and regular rhythm.     Heart sounds: Normal heart sounds. No murmur. No friction rub. No gallop.   Pulmonary:     Effort: Pulmonary effort is normal. No respiratory distress.     Breath sounds: Normal breath sounds. No stridor. No wheezing, rhonchi or rales.  Chest:     Chest wall: No tenderness.  Abdominal:     General: Bowel sounds are normal. There is no distension.     Palpations: Abdomen is soft.     Tenderness: There is no abdominal tenderness. There is no guarding.  Musculoskeletal:     Right lower leg: No edema.     Left lower leg: No edema.  Neurological:     Mental Status: He is alert.           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  I want to avoid extension of his stroke in the vulnerable peri-infarct area.  Therefore I want to avoid hypotension.  Of asked him to check his blood pressure daily.  If his blood pressure is low, I encouraged him to drink Gatorade to maintain his hydration and if his blood pressure is extremely low, we may need to temporarily discontinue his losartan.  Check CBC, CMP today to monitor his renal function.  In 1 month, we would start to try to ensure that we keep his systolic blood pressure less than 401 and his diastolic blood pressure less than 90 however for the first month will allow permissive hypertension.  Continue to work with physical therapy.

## 2020-01-21 LAB — CBC WITH DIFFERENTIAL/PLATELET
Absolute Monocytes: 732 cells/uL (ref 200–950)
Basophils Absolute: 18 cells/uL (ref 0–200)
Basophils Relative: 0.3 %
Eosinophils Absolute: 49 cells/uL (ref 15–500)
Eosinophils Relative: 0.8 %
HCT: 35.8 % — ABNORMAL LOW (ref 38.5–50.0)
Hemoglobin: 11.8 g/dL — ABNORMAL LOW (ref 13.2–17.1)
Lymphs Abs: 732 cells/uL — ABNORMAL LOW (ref 850–3900)
MCH: 28.7 pg (ref 27.0–33.0)
MCHC: 33 g/dL (ref 32.0–36.0)
MCV: 87.1 fL (ref 80.0–100.0)
MPV: 9.7 fL (ref 7.5–12.5)
Monocytes Relative: 12 %
Neutro Abs: 4569 cells/uL (ref 1500–7800)
Neutrophils Relative %: 74.9 %
Platelets: 233 10*3/uL (ref 140–400)
RBC: 4.11 10*6/uL — ABNORMAL LOW (ref 4.20–5.80)
RDW: 11.9 % (ref 11.0–15.0)
Total Lymphocyte: 12 %
WBC: 6.1 10*3/uL (ref 3.8–10.8)

## 2020-01-21 LAB — COMPLETE METABOLIC PANEL WITH GFR
AG Ratio: 2.1 (calc) (ref 1.0–2.5)
ALT: 22 U/L (ref 9–46)
AST: 15 U/L (ref 10–35)
Albumin: 4.4 g/dL (ref 3.6–5.1)
Alkaline phosphatase (APISO): 57 U/L (ref 35–144)
BUN: 21 mg/dL (ref 7–25)
CO2: 24 mmol/L (ref 20–32)
Calcium: 9.9 mg/dL (ref 8.6–10.3)
Chloride: 104 mmol/L (ref 98–110)
Creat: 1.17 mg/dL (ref 0.70–1.18)
GFR, Est African American: 69 mL/min/{1.73_m2} (ref >=60)
GFR, Est Non African American: 59 mL/min/{1.73_m2} — ABNORMAL LOW (ref >=60)
Globulin: 2.1 g/dL (calc) (ref 1.9–3.7)
Glucose, Bld: 100 mg/dL — ABNORMAL HIGH (ref 65–99)
Potassium: 4.3 mmol/L (ref 3.5–5.3)
Sodium: 140 mmol/L (ref 135–146)
Total Bilirubin: 0.5 mg/dL (ref 0.2–1.2)
Total Protein: 6.5 g/dL (ref 6.1–8.1)

## 2020-01-22 ENCOUNTER — Other Ambulatory Visit: Payer: Self-pay

## 2020-01-22 ENCOUNTER — Encounter (HOSPITAL_COMMUNITY): Payer: Self-pay | Admitting: Physical Therapy

## 2020-01-22 ENCOUNTER — Ambulatory Visit (HOSPITAL_COMMUNITY): Payer: Medicare Other | Attending: Family Medicine | Admitting: Physical Therapy

## 2020-01-22 DIAGNOSIS — R2689 Other abnormalities of gait and mobility: Secondary | ICD-10-CM | POA: Diagnosis not present

## 2020-01-22 DIAGNOSIS — R278 Other lack of coordination: Secondary | ICD-10-CM | POA: Insufficient documentation

## 2020-01-22 DIAGNOSIS — M6281 Muscle weakness (generalized): Secondary | ICD-10-CM

## 2020-01-22 DIAGNOSIS — R29898 Other symptoms and signs involving the musculoskeletal system: Secondary | ICD-10-CM | POA: Diagnosis not present

## 2020-01-22 DIAGNOSIS — R41841 Cognitive communication deficit: Secondary | ICD-10-CM | POA: Diagnosis not present

## 2020-01-22 DIAGNOSIS — R29818 Other symptoms and signs involving the nervous system: Secondary | ICD-10-CM

## 2020-01-22 NOTE — Therapy (Signed)
[facility] [address] [place], [place], [address] Phone: [phone]   Fax:  [phone]  Physical Therapy Treatment  Patient Details  Name: Devin Valdez MRN: [id number] Date of Birth: 08-02-1941 Referring Provider (PT): [name]   Encounter Date: 01/22/2020  PT End of Session - 01/22/20 1332    Visit Number  6    Number of Visits  12    Date for PT Re-Evaluation  02/07/20    Authorization Type  Primary: Medicare; Secondary: [employer]    Authorization Time Period  12/27/19 - 02/07/20    Authorization - Visit Number  2    Authorization - Number of Visits  10    PT Start Time  9150    PT Stop Time  5697   Ended session early due to patient fatigue   PT Time Calculation (min)  35 min    Equipment Utilized During Treatment  Gait belt    Activity Tolerance  Patient tolerated treatment well    Behavior During Therapy  [facility] for tasks assessed/performed       Past Medical History:  Diagnosis Date  . Anemia   . Anxiety   . 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 Depression   . Falls   . Hallucinations   . Hyperlipidemia   . Hypertension   . Stroke [facility])    left paracentral pons infarct    Past Surgical History:  Procedure Laterality Date  . EYE SURGERY    . HERNIA REPAIR    . POSTERIOR CERVICAL LAMINECTOMY     with fusion C3-C7 10/19- Dr. Brigitte Pulse at [facility]    There were no vitals filed for this visit.  Subjective Assessment - 01/22/20 1330    Subjective  Denied any pain. Reported some fatigue.    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  No/denies                       [facility] Adult PT Treatment/Exercise  - 01/22/20 0001      Ambulation/Gait   Ambulation/Gait  Yes    Ambulation Distance (Feet)  160 Feet    Assistive device  Small based quad cane    Gait Pattern  Poor foot clearance - right;Decreased stance time - right    Ambulation Surface  Level;Indoor    Gait Comments  Cues for improved foot clearance      Knee/Hip Exercises: Standing   Heel Raises  --   1 attempted, too fatigued at EOS   Other Standing Knee Exercises  Semi-tandem stance with pallof press using volleyball x 10 each LE. Stepping over (3)6-inch hurdles in // bars latreral 1x2       Knee/Hip Exercises: Seated   Sit to Sand  2 sets;5 reps;without UE support               PT Short Term Goals - 01/01/20 1042      PT SHORT TERM GOAL #1   Title  Patient will report understanding and regular compliance with HEP to improve strength, balance, and overall functional mobility.    Time  3    Period  Weeks    Status  On-going    Target Date  01/17/20        PT Long Term Goals - 01/17/20 1344      PT LONG TERM GOAL #1   Title  Patient will demonstrate ability to perform sit to stand without upper extremity assistance indicating improved functional strength and balance.    Time  6    Period  Weeks    Status  Achieved      PT LONG TERM GOAL #2   Title  Patient will demonstrate improvement of at least 3 points on the DGI indicating improved balance for improved safety with functional mobility.    Baseline  01/17/20: See objective measures    Time  6    Period  Weeks    Status  On-going      PT LONG TERM GOAL #3   Title  Patient will report improvement in endurance and functional mobility of at least 50%.    Baseline  01/17/20: Patient reported a decrease in endurance since extension of his stroke    Time  6    Period  Weeks    Status  On-going      PT LONG TERM GOAL #4   Title  Patient will ascend and descend at least 4 7'' steps with mod I in order to perform stair negotiation safely at home.    Baseline   01/17/20: Patient required CGA    Time  6    Period  Weeks    Status  On-going            Plan - 01/22/20 1452    Clinical Impression Statement  Continued with a focus on improved functional mobility and endurance. Patient's BP upon entering therapy was found to be 123/71. Patient required frequent rest breaks this session between exercises. This session added dynamic balance exercise of semi-tandem stance while performing a pallof press with a ball. Patient reported being fatigued at end of session, and was unable to perform heel raises. Ended session early to patient's tolerance.    Personal Factors and Comorbidities  Age;Comorbidity 3+    Comorbidities  CVA, cervical stenosis, depression    Examination-Activity Limitations  Bathing;Stand;Locomotion Level;Bed Mobility;Transfers;Stairs    Examination-Participation Restrictions  Yard Work;Cleaning;Meal Prep;Community Activity    Stability/Clinical Decision Making  Stable/Uncomplicated    Rehab Potential  Good    PT Frequency  2x / week    PT Duration  6 weeks    PT Treatment/Interventions  ADLs/Self Care Home Management;Aquatic Therapy;Cryotherapy;Electrical Stimulation;Moist Heat;DME Instruction;Gait training;Stair training;Functional mobility training;Therapeutic activities;Therapeutic exercise;Balance training;Neuromuscular re-education;Patient/family education;Orthotic Fit/Training;Manual techniques;Passive range of motion    PT Next Visit Plan  Take blood pressure at beginning of each session and as needed. Focus on gait training and dynamic balance with a focus on increasing patient's endurance.    PT Home Exercise Plan  01/01/20:  bridges, SLR, seated toeraises and LAQ; 01/10/20: Tandem stance balance each LE forward daily    Consulted and Agree with Plan of Care  Patient       Patient will benefit from skilled therapeutic intervention in order to improve the following deficits and impairments:  Abnormal gait, Decreased knowledge of  use of DME, Improper body mechanics, Decreased coordination, Decreased mobility, Postural dysfunction, Decreased activity tolerance, Decreased endurance, Decreased strength, Decreased balance, Difficulty walking  Visit Diagnosis: Other symptoms and signs involving the nervous system  Other abnormalities of gait and mobility  Muscle weakness (generalized)     Problem List Patient Active Problem List   Diagnosis Date Noted  . 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[place]) 01/13/2020  . Acute right-sided weakness 01/13/2020  . Difficulty urinating 01/13/2020  . Stroke ([place])   . CVA (cerebral vascular accident) ([place]) 12/20/2019  . 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[place])   . PVC (premature ventricular contraction)   . Cervical stenosis of spinal canal   . Failure to thrive in adult 05/26/2017  . Failure to thrive (0-17) 05/25/2017  . Abdominal pulsatile mass 05/25/2017  . Anemia 05/25/2017  . Essential hypertension 05/25/2017  . Gait instability 05/20/2017  . Frequent falls 05/20/2017  . Depression 05/20/2017   Clarene Critchley PT, DPT 2:55 PM, 01/22/20 [facility] [address] [place], [place], [address] Phone: [phone]   Fax:  [phone]  Name: Devin Valdez MRN: [id number] Date of Birth: 09-26-1941

## 2020-01-23 ENCOUNTER — Encounter (HOSPITAL_COMMUNITY): Payer: Self-pay | Admitting: Speech Pathology

## 2020-01-23 ENCOUNTER — Ambulatory Visit (HOSPITAL_COMMUNITY): Payer: Medicare Other | Admitting: Speech Pathology

## 2020-01-23 ENCOUNTER — Encounter (HOSPITAL_COMMUNITY): Payer: Self-pay

## 2020-01-23 ENCOUNTER — Ambulatory Visit (HOSPITAL_COMMUNITY): Payer: Medicare Other

## 2020-01-23 DIAGNOSIS — R29898 Other symptoms and signs involving the musculoskeletal system: Secondary | ICD-10-CM

## 2020-01-23 DIAGNOSIS — R2689 Other abnormalities of gait and mobility: Secondary | ICD-10-CM | POA: Diagnosis not present

## 2020-01-23 DIAGNOSIS — M6281 Muscle weakness (generalized): Secondary | ICD-10-CM | POA: Diagnosis not present

## 2020-01-23 DIAGNOSIS — R41841 Cognitive communication deficit: Secondary | ICD-10-CM | POA: Diagnosis not present

## 2020-01-23 DIAGNOSIS — R29818 Other symptoms and signs involving the nervous system: Secondary | ICD-10-CM | POA: Diagnosis not present

## 2020-01-23 DIAGNOSIS — R278 Other lack of coordination: Secondary | ICD-10-CM | POA: Diagnosis not present

## 2020-01-23 NOTE — Therapy (Signed)
[place], [place], [address] Phone: [phone]   Fax:  [phone]  Occupational Therapy Evaluation  Patient Details  Name: Devin Valdez MRN: [id number] Date of Birth: [birth year] Referring Provider (OT): Jenna Luo, MD   Encounter Date: 01/23/2020  OT End of Session - 01/23/20 0927    Visit Number  1    Number of Visits  1    Authorization Type  1) Medicare 2) [employer]    OT Start Time  0815    OT Stop Time  0900    OT Time Calculation (min)  45 min    Activity Tolerance  Patient tolerated treatment well    Behavior During Therapy  [facility] for tasks assessed/performed       Past Medical History:  Diagnosis Date  . Anemia   . Anxiety   . 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 Depression   . Falls   . Hallucinations   . Hyperlipidemia   . Hypertension   . Stroke [facility])    left paracentral pons infarct    Past Surgical History:  Procedure Laterality Date  . EYE SURGERY    . HERNIA REPAIR    . POSTERIOR CERVICAL LAMINECTOMY     with fusion C3-C7 10/19- Dr. Brigitte Pulse at [facility]    There were no vitals filed for this visit.  Subjective Assessment - 01/23/20 0821    Subjective   S: My right hand feels weaker than it should.    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 Patient is currently experiencing right hand weakness. Dr. Dennard Schaumann has referred patient to occupational therapy for evaluation and treatment.    Patient Stated Goals  to increase right hand strength    Currently in Pain?  Yes    Pain Score  7     Pain Location  Hand    Pain Orientation  Left;Right    Pain Type  Chronic pain    Pain Radiating Towards  N/A    Pain Onset  More than a month ago    Pain Frequency  Constant    Aggravating Factors   cold weather    Pain Relieving Factors  tylenol    Effect of Pain on Daily Activities  Moderate  effect    Multiple Pain Sites  No        [facility] OT Assessment - 01/23/20 6962      Assessment   Medical Diagnosis  right hand weakness    Referring Provider (OT)  Jenna Luo, MD    Onset Date/Surgical Date  12/20/19    Hand Dominance  Left    Next MD Visit  --   unknown   Prior Therapy  Patient received OP OT for a left hand contracture 1 year ago      Precautions   Precautions  Fall      Restrictions   Weight Bearing Restrictions  No      Balance Screen   Has the patient fallen in the past 6 months  No      Home  Environment   Family/patient expects to be discharged to:  Private residence    Living Arrangements  Spouse/significant other    [place];Shower seat;Grab bars - tub/shower;Toilet riser      Prior Function   Level of Independence  Requires assistive  device for independence    Vocation  Retired      ADL   ADL comments  Difficulty hold the newspaper or a book to read.      Written Expression   Dominant Hand  Left      Vision - History   Baseline Vision  Wears glasses only for reading      Cognition   Overall Cognitive Status  Within Functional Limits for tasks assessed      Coordination   9 [facility] Peg Test  Right;Left    Right 9 Hole Peg Test  43.3'    Left 9 Hole Peg Test  31.2"      ROM / Strength   AROM / PROM / Strength  Strength      Strength   Strength Assessment Site  Hand    Right/Left hand  Left;Right    Right Hand Grip (lbs)  60    Right Hand Lateral Pinch  16 lbs    Right Hand 3 Point Pinch  18 lbs    Left Hand Grip (lbs)  85    Left Hand Lateral Pinch  20 lbs    Left Hand 3 Point Pinch  24 lbs          Katina Dung - 01/23/20 0836    Open a tight or new jar  Severe difficulty    Do heavy household chores (wash walls, wash floors)  Moderate difficulty    Carry a shopping bag or briefcase  Moderate difficulty    Wash your back  Severe difficulty    Use a knife to cut food  Severe difficulty    Recreational  activities in which you take some force or impact through your arm, shoulder, or hand (golf, hammering, tennis)  Severe difficulty    During the past week, to what extent has your arm, shoulder or hand problem interfered with your normal social activities with family, friends, neighbors, or groups?  Extremely    During the past week, to what extent has your arm, shoulder or hand problem limited your work or other regular daily activities  Modererately    Arm, shoulder, or hand pain.  Extreme   arthritis   Tingling (pins and needles) in your arm, shoulder, or hand  Severe    Difficulty Sleeping  Severe difficulty    DASH Score  72.73 %                 OT Education - 01/23/20 0924    Education Details  yellow theraputty - grip and pinch strengthening. Coordination activities.Discussed completing HEP within his own set time limit.    Person(s) Educated  Patient    Methods  Explanation;Demonstration;Verbal cues;Handout    Comprehension  Verbalized understanding       OT Short Term Goals - 01/23/20 0935      OT SHORT TERM GOAL #1   Title  Patient will be educated on HEP for right hand to increase hand strength and coordination in order to increase his functional performance during ADL tasks.    Time  1    Period  Days    Status  Achieved    Target Date  01/23/20               Plan - 01/23/20 4782    Clinical Impression Statement  A: Patient is a 79 y/o male S/P Left CVA causing decreased hand strength and coordination resulting in difficulty completin basic ADL and leisure  activities. Patient reports low endurance and inability to participate in more than just PT at this time. Recommended patient complete HEP independently due to concern with endurance. Patient was educated on basing frequency on his own activity tolerance. Discussed how to progress and modify.    OT Occupational Profile and History  Problem Focused Assessment - Including review of records relating to  presenting problem    Occupational performance deficits (Please refer to evaluation for details):  ADL's;Rest and Sleep;IADL's;Leisure    Body Structure / Function / Physical Skills  ADL;UE functional use;FMC;Coordination;GMC;Sensation;Strength    Rehab Potential  Excellent    Clinical Decision Making  Limited treatment options, no task modification necessary    Comorbidities Affecting Occupational Performance:  May have comorbidities impacting occupational performance    Modification or Assistance to Complete Evaluation   No modification of tasks or assist necessary to complete eval    OT Frequency  One time visit    OT Treatment/Interventions  Patient/family education    Plan  P: 1 time visit with HEP.    OT Home Exercise Plan  eval: yellow theraputty (grip and pinch). coordination activities.    Consulted and Agree with Plan of Care  Patient       Patient will benefit from skilled therapeutic intervention in order to improve the following deficits and impairments:   Body Structure / Function / Physical Skills: ADL, UE functional use, FMC, Coordination, GMC, Sensation, Strength       Visit Diagnosis: Other symptoms and signs involving the musculoskeletal system - Plan: Ot plan of care cert/re-cert  Other lack of coordination - Plan: Ot plan of care cert/re-cert    Problem List Patient Active Problem List   Diagnosis Date Noted  . 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[place]) 01/13/2020  . Acute right-sided weakness 01/13/2020  . Difficulty urinating 01/13/2020  . Stroke ([place])   . CVA (cerebral vascular accident) ([place]) 12/20/2019  . 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 PVC (premature ventricular contraction)   . Cervical stenosis of spinal canal   . Failure to thrive in adult 05/26/2017  . Failure to thrive (0-17) 05/25/2017  . Abdominal pulsatile mass 05/25/2017  . Anemia 05/25/2017  . Essential hypertension 05/25/2017  . Gait instability 05/20/2017  . Frequent falls 05/20/2017  .  Depression 05/20/2017     Ailene Ravel, OTR/L,CBIS  [phone]  01/23/2020, 11:41 AM  [place] [address] [place], [place], [address] Phone: [phone]   Fax:  [phone]  Name: Devin Valdez MRN: [id number] Date of Birth: 1941-08-21

## 2020-01-23 NOTE — Therapy (Signed)
[place], [place], [address] Phone: [phone]   Fax:  [phone]  Speech Language Pathology Evaluation  Patient Details  Name: Devin Valdez MRN: [id number] Date of Birth: January 19, 1941 Referring Provider (SLP): Jenna Luo, MD   Encounter Date: 01/23/2020  End of Session - 01/23/20 3016    Visit Number  1    Number of Visits  9    Authorization Type  Medicare    SLP Start Time  0900    SLP Stop Time   0109    SLP Time Calculation (min)  38 min    Activity Tolerance  Patient tolerated treatment well       Past Medical History:  Diagnosis Date  . Anemia   . Anxiety   . 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 Depression   . Falls   . Hallucinations   . Hyperlipidemia   . Hypertension   . Stroke [facility])    left paracentral pons infarct    Past Surgical History:  Procedure Laterality Date  . EYE SURGERY    . HERNIA REPAIR    . POSTERIOR CERVICAL LAMINECTOMY     with fusion C3-C7 10/19- Dr. Brigitte Pulse at [facility]    There were no vitals filed for this visit.  Subjective Assessment - 01/23/20 0925    Subjective  "My mind is very limited in what I can do. I can't focus."    Special Tests  SLUMS    Currently in Pain?  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  Patient is currently experiencing impaired working memory. Dr. Dennard Schaumann has referred patient for SLP evaluation and treatment.     Behavioral/Cognition  alert and cooperative    Mobility Status  ambulatory      Balance Screen   Has the patient fallen in the past 6 months  No    Has the patient had a decrease in activity level because of a fear of falling?   No    Is the patient reluctant to leave their home because of a fear of falling?   No      Prior Functional Status   Cognitive/Linguistic Baseline  Within functional limits    Type of Home  House     Lives With  Spouse    Available Support  Family    Education  high school graduate    Vocation  Retired      Associate Professor   Overall Cognitive Status  Impaired/Different from baseline    Area of Impairment  Attention;Memory    Current Attention Level  Sustained    Memory  Decreased short-term memory    Attention  Sustained    Sustained Attention  Impaired    Memory  Impaired    Memory Impairment  Retrieval deficit    Awareness  Appears intact    Problem Solving  Appears intact      Auditory Comprehension   Overall Auditory Comprehension  Appears within functional limits for tasks assessed    Yes/No Questions  Within Functional Limits    Commands  Within Functional Limits    Conversation  Moderately complex    Interfering Components  Attention;Working Child psychotherapist  Within [employer]      Reading Comprehension   Reading Status  Within funtional limits      Expression   Primary Mode of Expression  Verbal      Verbal Expression   Overall Verbal Expression  Appears within functional limits for tasks assessed    Initiation  No impairment    Automatic Speech  Name;Social Response;Counting    Level of Generative/Spontaneous Verbalization  Conversation    Repetition  No impairment    Naming  No impairment    Pragmatics  No impairment    Interfering Components  Speech intelligibility    Non-Verbal Means of Communication  Not  applicable      Written Expression   Dominant Hand  Left    Written Expression  Not tested      Oral Motor/Sensory Function   Overall Oral Motor/Sensory Function  Appears within functional limits for tasks assessed      Motor Speech   Overall Motor Speech  Appears within functional limits for tasks assessed    Respiration  Within functional limits    Phonation  Normal    Resonance  Within functional limits    Articulation  Within functional limitis    Intelligibility  Intelligibility reduced    Word  75-100% accurate    Phrase  75-100% accurate    Sentence  75-100% accurate    Conversation  75-100% accurate    Motor Planning  Witnin functional limits    Motor Speech Errors  Not applicable    Effective Techniques  Slow rate;Over-articulate    Phonation  WFL      Standardized Assessments   Standardized Assessments   --   SLUMS 17/30       SLP Short Term Goals - 01/23/20 1657      SLP SHORT TERM GOAL #1   Title  Pt will implement memory strategies in functional therapy activities with 90% acc with mi/mod cues.    Baseline  75%    Time  4    Status  New    Target Date  02/27/20      SLP SHORT TERM GOAL #2   Title  Pt will complete selective and alternating attention tasks (moderately complex) with 90% acc and mi/mod cues.    Baseline  mod cues    Time  4    Period  Weeks    Status  New    Target Date  02/27/20      SLP SHORT TERM GOAL #3   Title  Pt will implement speech intelligibility strategies during conversational speech (decrease rate, self correct errors, self monitoring, checking to see if listener understands) with min assist.    Baseline  mod assist    Time  4    Period  Weeks    Status  New    Target Date  02/27/20       SLP Long Term Goals -  01/23/20 1700      SLP LONG TERM GOAL #1   Title  Pt will increase speech intelligibility to [facility] for small group setting with use of compensatory strategies as needed.    Baseline  min impairment    Time  1     Period  Months    Status  New      SLP LONG TERM GOAL #2   Title  Pt will increase working memory skills to [facility] for recall of personal/ functional information with use of strategies as needed.    Baseline  mi/mod impairment    Time  1    Period  Months    Status  New       Plan - 01/23/20 0938    Clinical Impression Statement  Pt presents with mild working memory and attention deficits and reduced speech intelligibility which is different from his baseline per Pt and spouse. Pt's wife feels that Pt may be slightly depressed about his stroke recovery. Pt will benefit from skilled SLP in order to address the above impairments, maximize independence, and decrease burden of care      Speech Therapy Frequency  2x / week    Duration  4 weeks    Treatment/Interventions  Cognitive reorganization;SLP instruction and feedback;Compensatory strategies;Internal/external aids;Patient/family education;Compensatory techniques;Cueing hierarchy    Potential to Achieve Goals  Good    Potential Considerations  Ability to learn/carryover information    SLP Home Exercise Plan  Pt will complete HEP as assigned to facilitate carryover of treatment strategies and techniques in home environment with use of written cues.    Consulted and Agree with Plan of Care  Patient;Family member/caregiver    Family Member Consulted  Wife, Bethena Roys       Patient will benefit from skilled therapeutic intervention in order to improve the following deficits and impairments:   Cognitive communication deficit    Problem List Patient Active Problem List   Diagnosis Date Noted  . 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[place]) 01/13/2020  . Acute right-sided weakness 01/13/2020  . Difficulty urinating 01/13/2020  . Stroke ([place])   . CVA (cerebral vascular accident) ([place]) 12/20/2019  . 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 PVC (premature ventricular contraction)   . Cervical stenosis of spinal canal   . Failure to thrive in adult 05/26/2017  .  Failure to thrive (0-17) 05/25/2017  . Abdominal pulsatile mass 05/25/2017  . Anemia 05/25/2017  . Essential hypertension 05/25/2017  . Gait instability 05/20/2017  . Frequent falls 05/20/2017  . Depression 05/20/2017   Thank you,  Genene Churn, [place]  Genene Churn 01/23/2020, [place] [address] [place], [place], [address] Phone: [phone]   Fax:  [phone]  Name: Devin Valdez MRN: [id number] Date of Birth: [birth year]

## 2020-01-23 NOTE — Patient Instructions (Signed)
Home Exercises Program Theraputty Exercises  Do the following exercises 1 times a day using your affected hand.  1. Roll putty into a ball.  2. Make into a pancake.  3. Roll putty into a roll.  4. Pinch along log with first finger and thumb.   5. Make into a ball.  6. Roll it back into a log.   7. Pinch using thumb and side of first finger.  8. Roll into a ball, then flatten into a pancake.  9. Using your fingers, make putty into a mountain.  10. Roll putty into ball and squeeze and release 10-12 times.    Coordination Activities  Perform the following activities for 15-30 minutes 1 times per day with right hand(s).   Flip cards 1 at a time as fast as you can.  Deal cards with your thumb (Hold deck in hand and push card off top with thumb).  Rotate card in hand (clockwise and counter-clockwise).  Shuffle cards.  Pick up coins and stack.  Pick up coins one at a time until you get 5-10 in your hand, then move coins from palm to fingertips to stack one at a time.  Twirl pen between fingers.

## 2020-01-24 ENCOUNTER — Encounter (HOSPITAL_COMMUNITY): Payer: Self-pay | Admitting: Physical Therapy

## 2020-01-24 ENCOUNTER — Other Ambulatory Visit: Payer: Self-pay

## 2020-01-24 ENCOUNTER — Ambulatory Visit (HOSPITAL_COMMUNITY): Payer: Medicare Other | Admitting: Physical Therapy

## 2020-01-24 DIAGNOSIS — M6281 Muscle weakness (generalized): Secondary | ICD-10-CM

## 2020-01-24 DIAGNOSIS — R41841 Cognitive communication deficit: Secondary | ICD-10-CM | POA: Diagnosis not present

## 2020-01-24 DIAGNOSIS — R2689 Other abnormalities of gait and mobility: Secondary | ICD-10-CM

## 2020-01-24 DIAGNOSIS — R29818 Other symptoms and signs involving the nervous system: Secondary | ICD-10-CM

## 2020-01-24 DIAGNOSIS — R29898 Other symptoms and signs involving the musculoskeletal system: Secondary | ICD-10-CM | POA: Diagnosis not present

## 2020-01-24 DIAGNOSIS — R278 Other lack of coordination: Secondary | ICD-10-CM | POA: Diagnosis not present

## 2020-01-24 NOTE — Therapy (Signed)
[place], [place], [address] Phone: [phone]   Fax:  [phone]  Physical Therapy Treatment  Patient Details  Name: Devin Valdez MRN: [id number] Date of Birth: [birth year] Referring Provider (PT): [name]   Encounter Date: 01/24/2020  PT End of Session - 01/24/20 1459    Visit Number  7    Number of Visits  12    Date for PT Re-Evaluation  02/07/20    Authorization Type  Primary: Medicare; Secondary: UHC    Authorization Time Period  12/27/19 - 02/07/20    Authorization - Visit Number  3    Authorization - Number of Visits  10    PT Start Time  1302    PT Stop Time  1340    PT Time Calculation (min)  38 min    Equipment Utilized During Treatment  Gait belt    Activity Tolerance  Patient limited by fatigue    Behavior During Therapy  WFL for tasks assessed/performed       Past Medical History:  Diagnosis Date  . Anemia   . Anxiety   . 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 Depression   . Falls   . Hallucinations   . Hyperlipidemia   . Hypertension   . Stroke [facility])    left paracentral pons infarct    Past Surgical History:  Procedure Laterality Date  . EYE SURGERY    . HERNIA REPAIR    . POSTERIOR CERVICAL LAMINECTOMY     with fusion C3-C7 10/19- Dr. Brigitte Pulse at [facility]    There were no vitals filed for this visit.  Subjective Assessment - 01/24/20 1306    Subjective  Patient reported that he is feeling about the same today as he did the other day.    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  No/denies                       [facility] Adult PT Treatment/Exercise -  01/24/20 0001      Ambulation/Gait   Ambulation/Gait  Yes    Ambulation Distance (Feet)  160 Feet    Assistive device  Small based quad cane    Gait Pattern  Poor foot clearance - right;Decreased stance time - right    Ambulation Surface  Level;Indoor    Gait Comments  cues for sequencing      Knee/Hip Exercises: Standing   Heel Raises  Both;2 sets;10 reps    Heel Raises Limitations  Toe raises 2 x 10 both    Other Standing Knee Exercises  Semi-tandem stance with pallof press using volleyball x 10 each LE. Semi tandem stance rotating head laterally x10             PT Education - 01/24/20 1458    Education Details  Educated on importance of continuing HEP and decreasing the number of repetitions, but increasing number of sets performed.    Person(s) Educated  Patient    Methods  Explanation    Comprehension  Verbalized understanding       PT Short Term  Goals - 01/01/20 1042      PT SHORT TERM GOAL #1   Title  Patient will report understanding and regular compliance with HEP to improve strength, balance, and overall functional mobility.    Time  3    Period  Weeks    Status  On-going    Target Date  01/17/20        PT Long Term Goals - 01/17/20 1344      PT LONG TERM GOAL #1   Title  Patient will demonstrate ability to perform sit to stand without upper extremity assistance indicating improved functional strength and balance.    Time  6    Period  Weeks    Status  Achieved      PT LONG TERM GOAL #2   Title  Patient will demonstrate improvement of at least 3 points on the DGI indicating improved balance for improved safety with functional mobility.    Baseline  01/17/20: See objective measures    Time  6    Period  Weeks    Status  On-going      PT LONG TERM GOAL #3   Title  Patient will report improvement in endurance and functional mobility of at least 50%.    Baseline  01/17/20: Patient reported a decrease in endurance since extension of his stroke    Time   6    Period  Weeks    Status  On-going      PT LONG TERM GOAL #4   Title  Patient will ascend and descend at least 4 7'' steps with mod I in order to perform stair negotiation safely at home.    Baseline  01/17/20: Patient required CGA    Time  6    Period  Weeks    Status  On-going            Plan - 01/24/20 1510    Clinical Impression Statement  Patient's blood pressure was taken at beginning of session in the right arm and found to be 157/77 with a heart rate of 69. However, patient's LEs had been crossed so told patient to uncross his legs and waited a few minutes before taking the patient's blood pressure again in the right arm and it was found to be 127/75 with a heart rate of 70 so proceeded with therapy. Patient reported fatigue and required rest breaks after each exercise. Patient was perseverating on his level of fatigue and seemed discouraged during session. Ended exercises early due to patient's fatigue, but spent time discussing and educating the patient on his home exercise program and modifying it to work on improving his endurance. Patient seemed more encouraged following this.    Personal Factors and Comorbidities  Age;Comorbidity 3+    Comorbidities  CVA, cervical stenosis, depression    Examination-Activity Limitations  Bathing;Stand;Locomotion Level;Bed Mobility;Transfers;Stairs    Examination-Participation Restrictions  Yard Work;Cleaning;Meal Prep;Community Activity    Stability/Clinical Decision Making  Stable/Uncomplicated    Rehab Potential  Good    PT Frequency  2x / week    PT Duration  6 weeks    PT Treatment/Interventions  ADLs/Self Care Home Management;Aquatic Therapy;Cryotherapy;Electrical Stimulation;Moist Heat;DME Instruction;Gait training;Stair training;Functional mobility training;Therapeutic activities;Therapeutic exercise;Balance training;Neuromuscular re-education;Patient/family education;Orthotic Fit/Training;Manual techniques;Passive range of motion     PT Next Visit Plan  Take blood pressure at beginning of each session and as needed. Focus on gait training and dynamic balance with a focus on increasing patient's endurance. F/U on performance of HEP  at home.    PT Home Exercise Plan  01/01/20:  bridges, SLR, seated toeraises and LAQ; 01/10/20: Tandem stance balance each LE forward daily    Consulted and Agree with Plan of Care  Patient       Patient will benefit from skilled therapeutic intervention in order to improve the following deficits and impairments:  Abnormal gait, Decreased knowledge of use of DME, Improper body mechanics, Decreased coordination, Decreased mobility, Postural dysfunction, Decreased activity tolerance, Decreased endurance, Decreased strength, Decreased balance, Difficulty walking  Visit Diagnosis: Other symptoms and signs involving the nervous system  Other abnormalities of gait and mobility  Muscle weakness (generalized)     Problem List Patient Active Problem List   Diagnosis Date Noted  . 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[place]) 01/13/2020  . Acute right-sided weakness 01/13/2020  . Difficulty urinating 01/13/2020  . Stroke ([place])   . CVA (cerebral vascular accident) ([place]) 12/20/2019  . 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[employer])   . PVC (premature ventricular contraction)   . Cervical stenosis of spinal canal   . Failure to thrive in adult 05/26/2017  . Failure to thrive (0-17) 05/25/2017  . Abdominal pulsatile mass 05/25/2017  . Anemia 05/25/2017  . Essential hypertension 05/25/2017  . Gait instability 05/20/2017  . Frequent falls 05/20/2017  . Depression 05/20/2017   Clarene Critchley PT, DPT 3:13 PM, 01/24/20 [place], [place], [address] Phone: [phone]   Fax:  [phone]  Name: Devin Valdez MRN: [id number] Date of Birth: 1940-12-22

## 2020-01-27 ENCOUNTER — Encounter (HOSPITAL_COMMUNITY): Payer: Self-pay | Admitting: Speech Pathology

## 2020-01-27 ENCOUNTER — Other Ambulatory Visit: Payer: Self-pay

## 2020-01-27 ENCOUNTER — Ambulatory Visit (HOSPITAL_COMMUNITY): Payer: Medicare Other | Admitting: Speech Pathology

## 2020-01-27 DIAGNOSIS — R2689 Other abnormalities of gait and mobility: Secondary | ICD-10-CM | POA: Diagnosis not present

## 2020-01-27 DIAGNOSIS — R29818 Other symptoms and signs involving the nervous system: Secondary | ICD-10-CM | POA: Diagnosis not present

## 2020-01-27 DIAGNOSIS — R278 Other lack of coordination: Secondary | ICD-10-CM | POA: Diagnosis not present

## 2020-01-27 DIAGNOSIS — M6281 Muscle weakness (generalized): Secondary | ICD-10-CM | POA: Diagnosis not present

## 2020-01-27 DIAGNOSIS — R41841 Cognitive communication deficit: Secondary | ICD-10-CM

## 2020-01-27 DIAGNOSIS — R29898 Other symptoms and signs involving the musculoskeletal system: Secondary | ICD-10-CM | POA: Diagnosis not present

## 2020-01-27 NOTE — Therapy (Signed)
[place], [place], [address] Phone: [phone]   Fax:  [phone]  Speech Language Pathology Treatment  Patient Details  Name: Devin Valdez MRN: [id number] Date of Birth: [birth year] Referring Provider (SLP): Jenna Luo, MD   Encounter Date: 01/27/2020  End of Session - 01/27/20 1516    Visit Number  2    Number of Visits  9    Authorization Type  Medicare    SLP Start Time  0900    SLP Stop Time   0945    SLP Time Calculation (min)  45 min    Activity Tolerance  Patient tolerated treatment well       Past Medical History:  Diagnosis Date  . Anemia   . Anxiety   . 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 Depression   . Falls   . Hallucinations   . Hyperlipidemia   . Hypertension   . Stroke [facility])    left paracentral pons infarct    Past Surgical History:  Procedure Laterality Date  . EYE SURGERY    . HERNIA REPAIR    . POSTERIOR CERVICAL LAMINECTOMY     with fusion C3-C7 10/19- Dr. Brigitte Pulse at [facility]    There were no vitals filed for this visit.  Subjective Assessment - 01/27/20 0951    Subjective  "I'm thinking about other things."    Currently in Pain?  No/denies        ADULT SLP TREATMENT - 01/27/20 1503      General Information   Behavior/Cognition  Alert;Cooperative;Pleasant mood    Patient Positioning  Upright in chair    Oral care provided  N/A    HPI  Devin Valdez is a 79 y/o male S/P left pontine artery stroke which he sustained on 12/20/19 and had extension of stroke 01/13/20.  Patient is currently experiencing impaired working memory. Dr. Dennard Schaumann has referred patient for SLP evaluation and treatment.        Treatment Provided   Treatment provided  Cognitive-Linquistic      Pain Assessment   Pain Assessment  No/denies pain      Cognitive-Linquistic Treatment   Treatment focused on  Cognition;Dysarthria;Patient/family/caregiver education     Skilled Treatment  SLP provided skilled treatment targeting goals below via use of explanation and discussion of Pt goals, memory and attention information and strategies, and speech intelligibility strategies.      Assessment / Recommendations / Plan   Plan  Continue with current plan of care      Progression Toward Goals   Progression toward goals  Progressing toward goals         SLP Short Term Goals - 01/27/20 1517      SLP SHORT TERM GOAL #1   Title  Pt will implement memory strategies in functional therapy activities with 90% acc with mi/mod cues.    Baseline  75%    Time  4    Status  On-going    Target Date  02/27/20      SLP SHORT TERM GOAL #2   Title  Pt will complete selective and alternating attention tasks (moderately complex) with 90% acc and mi/mod cues.    Baseline  mod cues    Time  4    Period  Weeks    Status  On-going    Target Date  02/27/20      SLP SHORT TERM GOAL #3  Title  Pt will implement speech intelligibility strategies during conversational speech (decrease rate, self correct errors, self monitoring, checking to see if listener understands) with min assist.    Baseline  mod assist    Time  4    Period  Weeks    Status  On-going    Target Date  02/27/20       SLP Long Term Goals - 01/27/20 1517      SLP LONG TERM GOAL #1   Title  Pt will increase speech intelligibility to [facility] for small group setting with use of compensatory strategies as needed.    Baseline  min impairment    Time  1    Period  Months    Status  On-going      SLP LONG TERM GOAL #2   Title  Pt will increase working memory skills to [facility] for recall of personal/ functional information with use of strategies as needed.    Baseline  mi/mod impairment    Time  1    Period  Months    Status  On-going       Plan - 01/27/20 1516    Clinical Impression Statement Pt completed 10-item cued recall memory activity with assist from SLP. Results as follows: 4/10, 6/10, 5/10, 7/10  (added sound machine/white noice), and 6/10. Pt was then able to recall 7/10 words without cues. He was able to identify that he was distracted by noises outside the therapy room and stated that he was thinking about other things. He acknowledges that attention/focus is very challenging for him. He was given written information regarding attention and memory and asked to review at home in preparation for next session.    Speech Therapy Frequency  2x / week    Duration  4 weeks    Treatment/Interventions  Cognitive reorganization;SLP instruction and feedback;Compensatory strategies;Internal/external aids;Patient/family education;Compensatory techniques;Cueing hierarchy    Potential to Achieve Goals  Good    Potential Considerations  Ability to learn/carryover information    SLP Home Exercise Plan  Pt will complete HEP as assigned to facilitate carryover of treatment strategies and techniques in home environment with use of written cues.    Consulted and Agree with Plan of Care  Patient;Family member/caregiver    Family Member Consulted  Wife, Bethena Roys       Patient will benefit from skilled therapeutic intervention in order to improve the following deficits and impairments:   Cognitive communication deficit    Problem List Patient Active Problem List   Diagnosis Date Noted  . 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[place]) 01/13/2020  . Acute right-sided weakness 01/13/2020  . Difficulty urinating 01/13/2020  . Stroke ([place])   . CVA (cerebral vascular accident) ([place]) 12/20/2019  . 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[place])   . PVC (premature ventricular contraction)   . Cervical stenosis of spinal canal   . Failure to thrive in adult 05/26/2017  . Failure to thrive (0-17) 05/25/2017  . Abdominal pulsatile mass 05/25/2017  . Anemia 05/25/2017  . Essential hypertension 05/25/2017  . Gait instability 05/20/2017  . Frequent falls 05/20/2017  . Depression 05/20/2017   Thank you,  Genene Churn,  [place]  [facility] 01/27/2020, 3:18 PM  [place] [address] [place], [place], [address] Phone: [phone]   Fax:  [phone]   Name: Devin Valdez MRN: [id number] Date of Birth: November 09, 1941

## 2020-01-29 ENCOUNTER — Ambulatory Visit (HOSPITAL_COMMUNITY): Payer: Medicare Other | Admitting: Physical Therapy

## 2020-01-29 ENCOUNTER — Other Ambulatory Visit: Payer: Self-pay

## 2020-01-29 ENCOUNTER — Encounter (HOSPITAL_COMMUNITY): Payer: Self-pay | Admitting: Physical Therapy

## 2020-01-29 DIAGNOSIS — R29818 Other symptoms and signs involving the nervous system: Secondary | ICD-10-CM

## 2020-01-29 DIAGNOSIS — M6281 Muscle weakness (generalized): Secondary | ICD-10-CM

## 2020-01-29 DIAGNOSIS — R2689 Other abnormalities of gait and mobility: Secondary | ICD-10-CM | POA: Diagnosis not present

## 2020-01-29 DIAGNOSIS — R41841 Cognitive communication deficit: Secondary | ICD-10-CM | POA: Diagnosis not present

## 2020-01-29 DIAGNOSIS — R29898 Other symptoms and signs involving the musculoskeletal system: Secondary | ICD-10-CM | POA: Diagnosis not present

## 2020-01-29 DIAGNOSIS — R278 Other lack of coordination: Secondary | ICD-10-CM | POA: Diagnosis not present

## 2020-01-29 NOTE — Therapy (Signed)
[place] [address] [place], [place], [address] Phone: [phone]   Fax:  [phone]  Physical Therapy Treatment  Patient Details  Name: Devin Valdez MRN: [id number] Date of Birth: May 02, 1941 Referring Provider (PT): [name]   Encounter Date: 01/29/2020  PT End of Session - 01/29/20 1415    Visit Number  8    Number of Visits  12    Date for PT Re-Evaluation  02/07/20    Authorization Type  Primary: Medicare; Secondary: UHC    Authorization Time Period  12/27/19 - 02/07/20    Authorization - Visit Number  4    Authorization - Number of Visits  10    PT Start Time  2458    PT Stop Time  1430    PT Time Calculation (min)  38 min    Equipment Utilized During Treatment  Gait belt    Activity Tolerance  Patient limited by fatigue    Behavior During Therapy  WFL for tasks assessed/performed       Past Medical History:  Diagnosis Date  . Anemia   . Anxiety   . 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 Depression   . Falls   . Hallucinations   . Hyperlipidemia   . Hypertension   . Stroke [facility])    left paracentral pons infarct    Past Surgical History:  Procedure Laterality Date  . EYE SURGERY    . HERNIA REPAIR    . POSTERIOR CERVICAL LAMINECTOMY     with fusion C3-C7 10/19- Dr. Brigitte Pulse at [facility]    There were no vitals filed for this visit.  Subjective Assessment - 01/29/20 1400    Subjective  THe patient reported that he is feeling good, but just feeling a little tired.    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  No/denies                       [facility] Adult PT Treatment/Exercise - 01/29/20  0001      Ambulation/Gait   Ambulation/Gait  Yes    Ambulation Distance (Feet)  160 Feet    Assistive device  Small based quad cane    Gait Pattern  Poor foot clearance - right;Decreased stance time - right    Ambulation Surface  Level;Indoor    Gait Comments  cues for sequencing      Knee/Hip Exercises: Standing   Heel Raises  Both;2 sets;10 reps    Heel Raises Limitations  Toe raises 2 x 10 both    Hip Abduction  Stengthening;Right;Left;1 set;10 reps;Knee straight    Hip Extension  Stengthening;Right;Left;1 set;10 reps;Knee straight    Forward Step Up  Right;Left;1 set;5 reps;Step Height: 4";Hand Hold: 2    Other Standing Knee Exercises  Semi-tandem stance with pallof press using volleyball x 10 each LE. Semi tandem stance rotating head laterally x10      Knee/Hip Exercises: Seated   Sit to Sand  2 sets;5 reps;without UE support               PT Short Term Goals -  01/01/20 1042      PT SHORT TERM GOAL #1   Title  Patient will report understanding and regular compliance with HEP to improve strength, balance, and overall functional mobility.    Time  3    Period  Weeks    Status  On-going    Target Date  01/17/20        PT Long Term Goals - 01/17/20 1344      PT LONG TERM GOAL #1   Title  Patient will demonstrate ability to perform sit to stand without upper extremity assistance indicating improved functional strength and balance.    Time  6    Period  Weeks    Status  Achieved      PT LONG TERM GOAL #2   Title  Patient will demonstrate improvement of at least 3 points on the DGI indicating improved balance for improved safety with functional mobility.    Baseline  01/17/20: See objective measures    Time  6    Period  Weeks    Status  On-going      PT LONG TERM GOAL #3   Title  Patient will report improvement in endurance and functional mobility of at least 50%.    Baseline  01/17/20: Patient reported a decrease in endurance since extension of his stroke     Time  6    Period  Weeks    Status  On-going      PT LONG TERM GOAL #4   Title  Patient will ascend and descend at least 4 7'' steps with mod I in order to perform stair negotiation safely at home.    Baseline  01/17/20: Patient required CGA    Time  6    Period  Weeks    Status  On-going            Plan - 01/29/20 1438    Clinical Impression Statement  The patient continues to be most limited by his endurance. He required a rest break following each set of exercises this session. Patient reports discouragement with exercises, and requires frequent encouragement to perform exercises. Patient's BP was taken at the start of the session and found to be 134/74. Therapist educated the patient that his ability to complete exercises for the session improved although rest breaks were still required indicating improvement in his endurance.    Personal Factors and Comorbidities  Age;Comorbidity 3+    Comorbidities  CVA, cervical stenosis, depression    Examination-Activity Limitations  Bathing;Stand;Locomotion Level;Bed Mobility;Transfers;Stairs    Examination-Participation Restrictions  Yard Work;Cleaning;Meal Prep;Community Activity    Stability/Clinical Decision Making  Stable/Uncomplicated    Rehab Potential  Good    PT Frequency  2x / week    PT Duration  6 weeks    PT Treatment/Interventions  ADLs/Self Care Home Management;Aquatic Therapy;Cryotherapy;Electrical Stimulation;Moist Heat;DME Instruction;Gait training;Stair training;Functional mobility training;Therapeutic activities;Therapeutic exercise;Balance training;Neuromuscular re-education;Patient/family education;Orthotic Fit/Training;Manual techniques;Passive range of motion    PT Next Visit Plan  Take blood pressure at beginning of each session and as needed. Focus on gait training and dynamic balance with a focus on increasing patient's endurance. F/U on performance of HEP at home.    PT Home Exercise Plan  01/01/20:  bridges, SLR,  seated toeraises and LAQ; 01/10/20: Tandem stance balance each LE forward daily    Consulted and Agree with Plan of Care  Patient       Patient will benefit from skilled therapeutic intervention in order to improve the  following deficits and impairments:  Abnormal gait, Decreased knowledge of use of DME, Improper body mechanics, Decreased coordination, Decreased mobility, Postural dysfunction, Decreased activity tolerance, Decreased endurance, Decreased strength, Decreased balance, Difficulty walking  Visit Diagnosis: Other symptoms and signs involving the nervous system  Other abnormalities of gait and mobility  Muscle weakness (generalized)     Problem List Patient Active Problem List   Diagnosis Date Noted  . 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[place]) 01/13/2020  . Acute right-sided weakness 01/13/2020  . Difficulty urinating 01/13/2020  . Stroke ([place])   . CVA (cerebral vascular accident) ([place]) 12/20/2019  . 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[place])   . PVC (premature ventricular contraction)   . Cervical stenosis of spinal canal   . Failure to thrive in adult 05/26/2017  . Failure to thrive (0-17) 05/25/2017  . Abdominal pulsatile mass 05/25/2017  . Anemia 05/25/2017  . Essential hypertension 05/25/2017  . Gait instability 05/20/2017  . Frequent falls 05/20/2017  . Depression 05/20/2017   Clarene Critchley PT, DPT 3:07 PM, 01/29/20 [place] [address] [place], [place], [address] Phone: [phone]   Fax:  [phone]  Name: Devin Valdez MRN: [id number] Date of Birth: 1941-03-03

## 2020-01-30 ENCOUNTER — Encounter (HOSPITAL_COMMUNITY): Payer: Self-pay | Admitting: Speech Pathology

## 2020-01-30 ENCOUNTER — Ambulatory Visit (HOSPITAL_COMMUNITY): Payer: Medicare Other | Admitting: Speech Pathology

## 2020-01-30 DIAGNOSIS — R29898 Other symptoms and signs involving the musculoskeletal system: Secondary | ICD-10-CM | POA: Diagnosis not present

## 2020-01-30 DIAGNOSIS — R278 Other lack of coordination: Secondary | ICD-10-CM | POA: Diagnosis not present

## 2020-01-30 DIAGNOSIS — M6281 Muscle weakness (generalized): Secondary | ICD-10-CM | POA: Diagnosis not present

## 2020-01-30 DIAGNOSIS — R41841 Cognitive communication deficit: Secondary | ICD-10-CM

## 2020-01-30 DIAGNOSIS — R2689 Other abnormalities of gait and mobility: Secondary | ICD-10-CM | POA: Diagnosis not present

## 2020-01-30 DIAGNOSIS — R29818 Other symptoms and signs involving the nervous system: Secondary | ICD-10-CM | POA: Diagnosis not present

## 2020-01-30 NOTE — Therapy (Signed)
[place], [place], [address] Phone: [phone]   Fax:  [phone]  Speech Language Pathology Treatment  Patient Details  Name: Devin Valdez MRN: [id number] Date of Birth: 25-Sep-1941 Referring Provider (SLP): Jenna Luo, MD   Encounter Date: 01/30/2020  End of Session - 01/30/20 1545    Visit Number  3    Number of Visits  9    Authorization Type  Medicare    SLP Start Time  0900    SLP Stop Time   0945    SLP Time Calculation (min)  45 min    Activity Tolerance  Patient tolerated treatment well       Past Medical History:  Diagnosis Date  . Anemia   . Anxiety   . 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 Depression   . Falls   . Hallucinations   . Hyperlipidemia   . Hypertension   . Stroke [facility])    left paracentral pons infarct    Past Surgical History:  Procedure Laterality Date  . EYE SURGERY    . HERNIA REPAIR    . POSTERIOR CERVICAL LAMINECTOMY     with fusion C3-C7 10/19- Dr. Brigitte Pulse at [facility]    There were no vitals filed for this visit.  Subjective Assessment - 01/30/20 1544    Subjective  "I don't know how I am doing."    Currently in Pain?  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  Patient is currently experiencing impaired working memory. Dr. Dennard Schaumann has referred patient for SLP evaluation and treatment.        Treatment Provided   Treatment provided  Cognitive-Linquistic      Pain Assessment   Pain Assessment  No/denies pain      Cognitive-Linquistic Treatment   Treatment focused on  Cognition;Dysarthria;Patient/family/caregiver education    Skilled Treatment  SLP provided skilled treatment targeting memory goals via use strategies for 4-item recall (tomato, green, July, and rabbit). SLP assisted Pt in implementing strategies (visualization, association, linking, first letter cues). Pt required mod assist to implement strategies and was able to recall 3 of 4 words after 5 minute delay.      Assessment / Recommendations / Plan   Plan  Continue with current plan of care      Progression Toward Goals   Progression toward goals  Progressing toward goals       SLP Education - 01/30/20 1545    Education Details  Provided memory work for [employer]) Educated  Patient    Methods  Explanation;Handout    Comprehension  Verbalized understanding       SLP Short Term Goals - 01/30/20 1546      SLP SHORT TERM GOAL #1   Title  Pt will implement memory strategies in functional therapy activities with 90% acc with mi/mod cues.    Baseline  75%    Time  4    Status  On-going    Target Date  02/27/20      SLP SHORT  TERM GOAL #2   Title  Pt will complete selective and alternating attention tasks (moderately complex) with 90% acc and mi/mod cues.    Baseline  mod cues    Time  4    Period  Weeks    Status  On-going    Target Date  02/27/20      SLP SHORT TERM GOAL #3   Title  Pt will implement speech intelligibility strategies during conversational speech (decrease rate, self correct errors, self monitoring, checking to see if listener understands) with min assist.    Baseline  mod assist    Time  4    Period  Weeks    Status  On-going    Target Date  02/27/20       SLP Long Term Goals - 01/30/20 1547      SLP LONG TERM GOAL #1   Title  Pt will increase speech intelligibility to [facility] for small group setting with use of compensatory strategies as needed.    Baseline  min impairment    Time  1    Period  Months    Status  On-going      SLP LONG TERM GOAL #2   Title  Pt will increase working memory skills to [facility] for  recall of personal/ functional information with use of strategies as needed.    Baseline  mi/mod impairment    Time  1    Period  Months    Status  On-going       Plan - 01/30/20 1546    Clinical Impression Statement  Pt continues to have difficulty with attention/concentration and needs guidance to remain focused. This negatively impacts his ability to recall functional information in dynamic environments. He was given homework to practice making associations to increase recall.   Speech Therapy Frequency  2x / week    Duration  4 weeks    Treatment/Interventions  Cognitive reorganization;SLP instruction and feedback;Compensatory strategies;Internal/external aids;Patient/family education;Compensatory techniques;Cueing hierarchy    Potential to Achieve Goals  Good    Potential Considerations  Ability to learn/carryover information    SLP Home Exercise Plan  Pt will complete HEP as assigned to facilitate carryover of treatment strategies and techniques in home environment with use of written cues.    Consulted and Agree with Plan of Care  Patient;Family member/caregiver    Family Member Consulted  Wife, Bethena Roys       Patient will benefit from skilled therapeutic intervention in order to improve the following deficits and impairments:   Cognitive communication deficit    Problem List Patient Active Problem List   Diagnosis Date Noted  . 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[place]) 01/13/2020  . Acute right-sided weakness 01/13/2020  . Difficulty urinating 01/13/2020  . Stroke ([place])   . CVA (cerebral vascular accident) ([place]) 12/20/2019  . 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[place])   . PVC (premature ventricular contraction)   . Cervical stenosis of spinal canal   . Failure to thrive in adult 05/26/2017  . Failure to thrive (0-17) 05/25/2017  . Abdominal pulsatile mass 05/25/2017  . Anemia 05/25/2017  . Essential hypertension 05/25/2017  . Gait instability 05/20/2017  . Frequent falls 05/20/2017  .  Depression 05/20/2017   Thank you,  Genene Churn, [place]  [facility] 01/30/2020, 3:47 PM  [place] [address] [place], [place], [address] Phone: [phone]   Fax:  [phone]   Name: TANNON PEERSON MRN: [id number] Date of Birth: 1941-10-08

## 2020-01-31 ENCOUNTER — Ambulatory Visit (HOSPITAL_COMMUNITY): Payer: Medicare Other | Admitting: Physical Therapy

## 2020-01-31 ENCOUNTER — Telehealth (HOSPITAL_COMMUNITY): Payer: Self-pay | Admitting: Physical Therapy

## 2020-01-31 NOTE — Telephone Encounter (Signed)
He is not feeling like coming today - he wants to rest. He will be here on Monday

## 2020-02-03 ENCOUNTER — Encounter (HOSPITAL_COMMUNITY): Payer: Self-pay | Admitting: Speech Pathology

## 2020-02-03 ENCOUNTER — Other Ambulatory Visit: Payer: Self-pay

## 2020-02-03 ENCOUNTER — Ambulatory Visit (HOSPITAL_COMMUNITY): Payer: Medicare Other | Admitting: Speech Pathology

## 2020-02-03 DIAGNOSIS — R29898 Other symptoms and signs involving the musculoskeletal system: Secondary | ICD-10-CM | POA: Diagnosis not present

## 2020-02-03 DIAGNOSIS — R278 Other lack of coordination: Secondary | ICD-10-CM | POA: Diagnosis not present

## 2020-02-03 DIAGNOSIS — R41841 Cognitive communication deficit: Secondary | ICD-10-CM

## 2020-02-03 DIAGNOSIS — M6281 Muscle weakness (generalized): Secondary | ICD-10-CM | POA: Diagnosis not present

## 2020-02-03 DIAGNOSIS — R2689 Other abnormalities of gait and mobility: Secondary | ICD-10-CM | POA: Diagnosis not present

## 2020-02-03 DIAGNOSIS — R29818 Other symptoms and signs involving the nervous system: Secondary | ICD-10-CM | POA: Diagnosis not present

## 2020-02-03 NOTE — Therapy (Signed)
[place], [place], [address] Phone: [phone]   Fax:  [phone]  Speech Language Pathology Treatment  Patient Details  Name: Devin Valdez MRN: [id number] Date of Birth: Aug 29, 1941 Referring Provider (SLP): Jenna Luo, MD   Encounter Date: 02/03/2020  End of Session - 02/03/20 0917    Visit Number  4    Number of Visits  9    Authorization Type  Medicare    SLP Start Time  0904    SLP Stop Time   0945    SLP Time Calculation (min)  41 min    Activity Tolerance  Patient tolerated treatment well       Past Medical History:  Diagnosis Date  . Anemia   . Anxiety   . 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 Depression   . Falls   . Hallucinations   . Hyperlipidemia   . Hypertension   . Stroke [facility])    left paracentral pons infarct    Past Surgical History:  Procedure Laterality Date  . EYE SURGERY    . HERNIA REPAIR    . POSTERIOR CERVICAL LAMINECTOMY     with fusion C3-C7 10/19- Dr. Brigitte Pulse at [facility]    There were no vitals filed for this visit.  Subjective Assessment - 02/03/20 0912    Subjective  "I was watching golf and trying to keep track of the leader board."    Currently in Pain?  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  Patient is currently experiencing impaired working memory. Dr. Dennard Schaumann has referred patient for SLP evaluation and treatment.        Treatment Provided   Treatment provided  Cognitive-Linquistic      Pain Assessment   Pain Assessment  No/denies pain      Cognitive-Linquistic Treatment   Treatment focused on   Cognition;Dysarthria;Patient/family/caregiver education    Skilled Treatment  SLP provided skilled treatment targeting memory, attention, and speech intelligibility in functional tasks. Pt forgot his folder today because he thought he had physical therapy. He was encouraged to increase volume and over articulate. The mask makes it more challenging of course. His wife has a hearing impairment, but she indicates that she understands him well. Pt was asked to identify what he is working on in PT and in SLP and to identify personal goals for himself. He reports that he has trouble seeing progress. SLP will discuss with PT.       Assessment / Recommendations / Plan   Plan  Continue with current plan of care      Progression Toward Goals   Progression toward goals  Progressing toward goals         SLP Short Term Goals - 02/03/20 0917      SLP SHORT TERM GOAL #1   Title  Pt will implement memory strategies in functional therapy activities with 90% acc with mi/mod cues.    Baseline  75%    Time  4    Status  On-going    Target Date  02/27/20      SLP SHORT TERM GOAL #2   Title  Pt will complete selective and alternating attention tasks (moderately complex) with 90% acc and mi/mod cues.    Baseline  mod cues    Time  4    Period  Weeks    Status  On-going    Target Date  02/27/20      SLP SHORT TERM GOAL #3   Title  Pt will implement speech intelligibility strategies during conversational speech (decrease rate, self correct errors, self monitoring, checking to see if listener understands) with min assist.    Baseline  mod assist    Time  4    Period  Weeks    Status  On-going    Target Date  02/27/20       SLP Long Term Goals - 02/03/20 0917      SLP LONG TERM GOAL #1   Title  Pt will increase speech intelligibility to [facility] for small group setting with use of compensatory strategies as needed.    Baseline  min impairment    Time  1    Period  Months    Status  On-going      SLP  LONG TERM GOAL #2   Title  Pt will increase working memory skills to [facility] for recall of personal/ functional information with use of strategies as needed.    Baseline  mi/mod impairment    Time  1    Period  Months    Status  On-going       Plan - 02/03/20 0917    Clinical Impression Statement Pt reminded to bring his folder to each therapy session, including PT. He presents with mild cognitive linguistic deficits primarily with attention difficulties. Next session, plan to complete functional memory and attention task.    Speech Therapy Frequency  2x / week    Duration  4 weeks    Treatment/Interventions  Cognitive reorganization;SLP instruction and feedback;Compensatory strategies;Internal/external aids;Patient/family education;Compensatory techniques;Cueing hierarchy    Potential to Achieve Goals  Good    Potential Considerations  Ability to learn/carryover information    SLP Home Exercise Plan  Pt will complete HEP as assigned to facilitate carryover of treatment strategies and techniques in home environment with use of written cues.    Consulted and Agree with Plan of Care  Patient;Family member/caregiver    Family Member Consulted  Wife, Bethena Roys       Patient will benefit from skilled therapeutic intervention in order to improve the following deficits and impairments:   Cognitive communication deficit    Problem List Patient Active Problem List   Diagnosis Date Noted  . 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[place]) 01/13/2020  . Acute right-sided weakness 01/13/2020  . Difficulty urinating 01/13/2020  . Stroke ([place])   . CVA (cerebral vascular accident) ([place]) 12/20/2019  . 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[place])   . PVC (premature ventricular contraction)   . Cervical stenosis of spinal canal   . Failure to thrive in adult 05/26/2017  . Failure to thrive (0-17) 05/25/2017  . Abdominal pulsatile mass 05/25/2017  . Anemia 05/25/2017  . Essential hypertension 05/25/2017  . Gait instability 05/20/2017   . Frequent falls 05/20/2017  . Depression 05/20/2017   Thank you,  Genene Churn, [place]  [facility] 02/03/2020, 9:18 AM  [place], [place], [address] Phone: [phone]   Fax:  [phone]   Name: Devin Valdez MRN: [id number] Date  of Birth: 1941/01/26

## 2020-02-05 ENCOUNTER — Encounter (HOSPITAL_COMMUNITY): Payer: Self-pay | Admitting: Physical Therapy

## 2020-02-05 ENCOUNTER — Encounter (HOSPITAL_COMMUNITY): Payer: Self-pay | Admitting: Speech Pathology

## 2020-02-05 ENCOUNTER — Other Ambulatory Visit: Payer: Self-pay

## 2020-02-05 ENCOUNTER — Ambulatory Visit (HOSPITAL_COMMUNITY): Payer: Medicare Other | Admitting: Speech Pathology

## 2020-02-05 ENCOUNTER — Ambulatory Visit (HOSPITAL_COMMUNITY): Payer: Medicare Other | Admitting: Physical Therapy

## 2020-02-05 DIAGNOSIS — R41841 Cognitive communication deficit: Secondary | ICD-10-CM | POA: Diagnosis not present

## 2020-02-05 DIAGNOSIS — R2689 Other abnormalities of gait and mobility: Secondary | ICD-10-CM | POA: Diagnosis not present

## 2020-02-05 DIAGNOSIS — R278 Other lack of coordination: Secondary | ICD-10-CM | POA: Diagnosis not present

## 2020-02-05 DIAGNOSIS — M6281 Muscle weakness (generalized): Secondary | ICD-10-CM | POA: Diagnosis not present

## 2020-02-05 DIAGNOSIS — R29898 Other symptoms and signs involving the musculoskeletal system: Secondary | ICD-10-CM | POA: Diagnosis not present

## 2020-02-05 DIAGNOSIS — R29818 Other symptoms and signs involving the nervous system: Secondary | ICD-10-CM | POA: Diagnosis not present

## 2020-02-05 NOTE — Therapy (Signed)
[place] [address] [place], [place], [address] Phone: [phone]   Fax:  [phone]  Physical Therapy Treatment  Patient Details  Name: Devin Valdez MRN: [id number] Date of Birth: [birth year] Referring Provider (PT): [name]   Encounter Date: 02/05/2020  PT End of Session - 02/05/20 1314    Visit Number  9    Number of Visits  12    Date for PT Re-Evaluation  02/07/20    Authorization Type  Primary: Medicare; Secondary: UHC    Authorization Time Period  12/27/19 - 02/07/20    Authorization - Visit Number  5    Authorization - Number of Visits  10    PT Start Time  1304    PT Stop Time  1342    PT Time Calculation (min)  38 min    Equipment Utilized During Treatment  Gait belt    Activity Tolerance  Patient limited by fatigue    Behavior During Therapy  WFL for tasks assessed/performed       Past Medical History:  Diagnosis Date  . Anemia   . Anxiety   . 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 Depression   . Falls   . Hallucinations   . Hyperlipidemia   . Hypertension   . Stroke [facility])    left paracentral pons infarct    Past Surgical History:  Procedure Laterality Date  . EYE SURGERY    . HERNIA REPAIR    . POSTERIOR CERVICAL LAMINECTOMY     with fusion C3-C7 10/19- Dr. Brigitte Pulse at [facility]    There were no vitals filed for this visit.  Subjective Assessment - 02/05/20 1308    Subjective  The patient reported doing good today, no reported pain.    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  No/denies                       [facility] Adult PT Treatment/Exercise - 02/05/20 0001      Ambulation/Gait   Ambulation/Gait  Yes    Ambulation Distance (Feet)  170 Feet    Assistive device  Small based quad cane    Gait Pattern  Poor foot clearance - right;Decreased stance time - right    Ambulation Surface  Level;Indoor    Number of Stairs  4    Height of Stairs  7    Gait Comments  CGA for stair negotiation. Improved tolerance and decreased cueing required      Knee/Hip Exercises: Standing   Heel Raises  Both;2 sets;10 reps    Hip Abduction  Stengthening;Right;Left;1 set;10 reps;Knee straight    Hip Extension  Stengthening;Right;Left;1 set;10 reps;Knee straight    Lateral Step Up  Right;Left;1 set;10 reps;Step Height: 6";Hand Hold: 2    Public relations account executive between 4 cones 15' x 2 with quad cane    Other Standing Knee Exercises  Semi tandem stance rotating head laterally x10      Knee/Hip Exercises: Seated   Sit to Sand  2 sets;5 reps;without UE support  PT Short Term Goals - 01/01/20 1042      PT SHORT TERM GOAL #1   Title  Patient will report understanding and regular compliance with HEP to improve strength, balance, and overall functional mobility.    Time  3    Period  Weeks    Status  On-going    Target Date  01/17/20        PT Long Term Goals - 01/17/20 1344      PT LONG TERM GOAL #1   Title  Patient will demonstrate ability to perform sit to stand without upper extremity assistance indicating improved functional strength and balance.    Time  6    Period  Weeks    Status  Achieved      PT LONG TERM GOAL #2   Title  Patient will demonstrate improvement of at least 3 points on the DGI indicating improved balance for improved safety with functional mobility.    Baseline  01/17/20: See objective measures    Time  6    Period  Weeks    Status  On-going      PT LONG TERM GOAL #3   Title  Patient will report improvement in endurance and functional mobility of at least 50%.    Baseline  01/17/20: Patient reported a decrease in  endurance since extension of his stroke    Time  6    Period  Weeks    Status  On-going      PT LONG TERM GOAL #4   Title  Patient will ascend and descend at least 4 7'' steps with mod I in order to perform stair negotiation safely at home.    Baseline  01/17/20: Patient required CGA    Time  6    Period  Weeks    Status  On-going            Plan - 02/05/20 1513    Clinical Impression Statement  Focused on improving endurance and balance this session. Added ambulation weaving in-between cones this session and practiced negotiating stairs this session with patient demonstrating good sequence with ascending and descending stairs. Patient continued to require rest breaks frequently throughout session. His blood pressure assessed and found to be 143/77 with a HR of 66 bpm. Patient reported feeling fine throughout, just fatigued.    Personal Factors and Comorbidities  Age;Comorbidity 3+    Comorbidities  CVA, cervical stenosis, depression    Examination-Activity Limitations  Bathing;Stand;Locomotion Level;Bed Mobility;Transfers;Stairs    Examination-Participation Restrictions  Yard Work;Cleaning;Meal Prep;Community Activity    Stability/Clinical Decision Making  Stable/Uncomplicated    Rehab Potential  Good    PT Frequency  2x / week    PT Duration  6 weeks    PT Treatment/Interventions  ADLs/Self Care Home Management;Aquatic Therapy;Cryotherapy;Electrical Stimulation;Moist Heat;DME Instruction;Gait training;Stair training;Functional mobility training;Therapeutic activities;Therapeutic exercise;Balance training;Neuromuscular re-education;Patient/family education;Orthotic Fit/Training;Manual techniques;Passive range of motion    PT Next Visit Plan  Take blood pressure at beginning of each session and as needed. Focus on gait training and dynamic balance with a focus on increasing patient's endurance.    PT Home Exercise Plan  01/01/20:  bridges, SLR, seated toeraises and LAQ; 01/10/20: Tandem  stance balance each LE forward daily    Consulted and Agree with Plan of Care  Patient       Patient will benefit from skilled therapeutic intervention in order to improve the following deficits and impairments:  Abnormal gait, Decreased knowledge of use of DME, Improper  body mechanics, Decreased coordination, Decreased mobility, Postural dysfunction, Decreased activity tolerance, Decreased endurance, Decreased strength, Decreased balance, Difficulty walking  Visit Diagnosis: Other symptoms and signs involving the nervous system  Other abnormalities of gait and mobility  Muscle weakness (generalized)     Problem List Patient Active Problem List   Diagnosis Date Noted  . 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[place]) 01/13/2020  . Acute right-sided weakness 01/13/2020  . Difficulty urinating 01/13/2020  . Stroke ([place])   . CVA (cerebral vascular accident) ([place]) 12/20/2019  . 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[place])   . PVC (premature ventricular contraction)   . Cervical stenosis of spinal canal   . Failure to thrive in adult 05/26/2017  . Failure to thrive (0-17) 05/25/2017  . Abdominal pulsatile mass 05/25/2017  . Anemia 05/25/2017  . Essential hypertension 05/25/2017  . Gait instability 05/20/2017  . Frequent falls 05/20/2017  . Depression 05/20/2017   Clarene Critchley PT, DPT 3:15 PM, 02/05/20 [place], [place], [address] Phone: [phone]   Fax:  [phone]  Name: AWAD GLADD MRN: [id number] Date of Birth: 12/23/1940

## 2020-02-05 NOTE — Therapy (Signed)
[place], [place], [address] Phone: [phone]   Fax:  [phone]  Speech Language Pathology Treatment  Patient Details  Name: Devin Valdez MRN: [id number] Date of Birth: 30-Dec-1940 Referring Provider (SLP): Jenna Luo, MD   Encounter Date: 02/05/2020  End of Session - 02/05/20 1155    Visit Number  5    Number of Visits  9    Authorization Type  Medicare    SLP Start Time  1130    SLP Stop Time   3546    SLP Time Calculation (min)  45 min    Activity Tolerance  Patient tolerated treatment well       Past Medical History:  Diagnosis Date  . Anemia   . Anxiety   . 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 Depression   . Falls   . Hallucinations   . Hyperlipidemia   . Hypertension   . Stroke [facility])    left paracentral pons infarct    Past Surgical History:  Procedure Laterality Date  . EYE SURGERY    . HERNIA REPAIR    . POSTERIOR CERVICAL LAMINECTOMY     with fusion C3-C7 10/19- Dr. Brigitte Pulse at [facility]    There were no vitals filed for this visit.  Subjective Assessment - 02/05/20 1153    Subjective  "I am trying to tell myself to stay focused."    Currently in Pain?  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  Patient is currently experiencing impaired working memory. Dr. Dennard Schaumann has referred patient for SLP evaluation and treatment.        Treatment Provided   Treatment provided  Cognitive-Linquistic      Pain Assessment   Pain Assessment  No/denies pain      Cognitive-Linquistic Treatment   Treatment focused on  Cognition;Dysarthria;Patient/family/caregiver  education    Skilled Treatment  SLP provided skilled treatment targeting attention, memory, and speech intelligibility. Pt completed memory association activity with 93% acc when provided mi/mod cues. Suspect primary deficits with memory are due to attention difficulties. He completed visual attention activity (Blink game modification) with 100% acc and min cues. He then completed auditory attention task (add 2 and subtract 2) with 100% acc.       Assessment / Recommendations / Plan   Plan  Continue with current plan of care      Progression Toward Goals   Progression toward goals  Progressing toward goals         SLP Short Term Goals - 02/05/20 1202      SLP SHORT TERM GOAL #1   Title  Pt will implement memory strategies in functional therapy activities with 90% acc with mi/mod cues.    Baseline  75%    Time  4    Status  On-going    Target Date  02/27/20      SLP SHORT TERM GOAL #2   Title  Pt will complete selective and alternating attention tasks (moderately complex) with 90% acc and mi/mod cues.    Baseline  mod cues    Time  4    Period  Weeks    Status  On-going    Target Date  02/27/20      SLP SHORT TERM GOAL #3   Title  Pt will implement speech intelligibility strategies during conversational speech (decrease rate, self correct errors, self monitoring, checking to see if listener understands) with min assist.    Baseline  mod assist    Time  4    Period  Weeks    Status  On-going    Target Date  02/27/20       SLP Long Term Goals - 02/05/20 1205      SLP LONG TERM GOAL #1   Title  Pt will increase speech intelligibility to [facility] for small group setting with use of compensatory strategies as needed.    Baseline  min impairment    Time  1    Period  Months    Status  On-going      SLP LONG TERM GOAL #2   Title  Pt will increase working memory skills to [facility] for recall of personal/ functional information with use of strategies as needed.    Baseline  mi/mod  impairment    Time  1    Period  Months    Status  On-going       Plan - 02/05/20 1201    Clinical Impression Statement Pt reports feeling frustrated about his reduced endurance physically and cognitively. He was encouraged to discuss with his PT in regards to physical stamina. From a cognitive standpoint, he was reminded that he needs to take rest breaks and maybe only complete cognitively demanding tasks for 5 minute increments and slowly increase. He was given association memory activity for homework. Continue to address goals next session.    Speech Therapy Frequency  2x / week    Duration  4 weeks    Treatment/Interventions  Cognitive reorganization;SLP instruction and feedback;Compensatory strategies;Internal/external aids;Patient/family education;Compensatory techniques;Cueing hierarchy    Potential to Achieve Goals  Good    Potential Considerations  Ability to learn/carryover information    SLP Home Exercise Plan  Pt will complete HEP as assigned to facilitate carryover of treatment strategies and techniques in home environment with use of written cues.    Consulted and Agree with Plan of Care  Patient;Family member/caregiver    Family Member Consulted  Wife, Devin Valdez       Patient will benefit from skilled therapeutic intervention in order to improve the following deficits and impairments:   Cognitive communication deficit    Problem List Patient Active Problem List   Diagnosis Date Noted  . 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[place]) 01/13/2020  . Acute right-sided weakness 01/13/2020  . Difficulty urinating 01/13/2020  . Stroke ([place])   . CVA (cerebral vascular accident) ([place]) 12/20/2019  . 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[place])   . PVC (premature ventricular contraction)   . Cervical stenosis of spinal canal   . Failure to thrive in adult 05/26/2017  . Failure to thrive (0-17) 05/25/2017  . Abdominal pulsatile mass 05/25/2017  . Anemia 05/25/2017  . Essential hypertension 05/25/2017  . Gait  instability 05/20/2017  . Frequent falls 05/20/2017  . Depression 05/20/2017   Thank you,  Devin Valdez, [place]  [facility] 02/05/2020, 12:07 PM  [place] [address] [place], [place], [address] Phone: [phone]   Fax:  [phone]   Name: Devin Valdez MRN: [id number] Date of Birth: 06/28/1941

## 2020-02-07 ENCOUNTER — Other Ambulatory Visit: Payer: Self-pay

## 2020-02-07 ENCOUNTER — Ambulatory Visit (HOSPITAL_COMMUNITY): Payer: Medicare Other | Admitting: Physical Therapy

## 2020-02-07 ENCOUNTER — Encounter (HOSPITAL_COMMUNITY): Payer: Self-pay | Admitting: Physical Therapy

## 2020-02-07 DIAGNOSIS — R29818 Other symptoms and signs involving the nervous system: Secondary | ICD-10-CM | POA: Diagnosis not present

## 2020-02-07 DIAGNOSIS — R41841 Cognitive communication deficit: Secondary | ICD-10-CM | POA: Diagnosis not present

## 2020-02-07 DIAGNOSIS — R29898 Other symptoms and signs involving the musculoskeletal system: Secondary | ICD-10-CM | POA: Diagnosis not present

## 2020-02-07 DIAGNOSIS — M6281 Muscle weakness (generalized): Secondary | ICD-10-CM | POA: Diagnosis not present

## 2020-02-07 DIAGNOSIS — R2689 Other abnormalities of gait and mobility: Secondary | ICD-10-CM | POA: Diagnosis not present

## 2020-02-07 DIAGNOSIS — R278 Other lack of coordination: Secondary | ICD-10-CM | POA: Diagnosis not present

## 2020-02-07 NOTE — Therapy (Signed)
[place] [address] [place], [place], [address] Phone: [phone]   Fax:  [phone]  Physical Therapy Treatment / Progress Note  Patient Details  Name: Devin Valdez MRN: [id number] Date of Birth: 11/12/1941 Referring Provider (PT): [name]   Encounter Date: 02/07/2020   Progress Note Reporting Period 01/17/20 to 02/07/20  See note below for Objective Data and Assessment of Progress/Goals.       PT End of Session - 02/07/20 1123    Visit Number  10    Number of Visits  18    Date for PT Re-Evaluation  02/28/20    Authorization Type  Primary: Medicare; Secondary: [employer]    Authorization Time Period  12/27/19 - 02/07/20; 02/07/20 - 02/28/20    Authorization - Visit Number  --    Authorization - Number of Visits  --    Progress Note Due on Visit  20    PT Start Time  1116    PT Stop Time  1154    PT Time Calculation (min)  38 min    Equipment Utilized During Treatment  Gait belt    Activity Tolerance  Patient limited by fatigue;Patient tolerated treatment well    Behavior During Therapy  WFL for tasks assessed/performed       Past Medical History:  Diagnosis Date  . Anemia   . Anxiety   . 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 Depression   . Falls   . Hallucinations   . Hyperlipidemia   . Hypertension   . Stroke [facility])    left paracentral pons infarct    Past Surgical History:  Procedure Laterality Date  . EYE SURGERY    . HERNIA REPAIR    . POSTERIOR CERVICAL LAMINECTOMY     with fusion C3-C7 10/19- Dr. Brigitte Pulse at [facility]    There were no vitals filed for this visit.  Subjective Assessment - 02/07/20 1118    Subjective  Patient reported that he feels like his endurance has improved between 50-75%.    Pertinent History  S/P Left Pontine Artery CVA; Extension of stroke 01/13/20    Limitations  Walking;House hold activities    Diagnostic tests  MRI 12/20/19: Acute brainstem infarct; MRI  01/13/20: extension of stroke    Patient Stated Goals  Patient wants to have his balance equal and improve his walking    Currently in Pain?  Yes    Pain Score  9     Pain Location  Hand    Pain Orientation  Right;Left    Pain Descriptors / Indicators  Aching    Pain Type  Chronic pain    Pain Onset  More than a month ago    Pain Frequency  Constant         [facility] PT Assessment - 02/07/20 0001      Observation/Other Assessments   Focus on Therapeutic Outcomes (FOTO)   46% limited   was 53% limited     Dynamic Gait Index   Level Surface  Normal    Change in Gait Speed  Normal    Gait with Horizontal Head Turns  Mild Impairment    Gait with Vertical Head Turns  Mild Impairment    Gait and Pivot Turn  Mild Impairment    Step Over Obstacle  Normal    Step Around Obstacles  Mild Impairment    Steps  Mild Impairment  Total Score  19    DGI comment:  Was 37, then 15                   [facility] Adult PT Treatment/Exercise - 02/07/20 0001      Ambulation/Gait   Ambulation/Gait  Yes    Ambulation Distance (Feet)  200 Feet   2MWT   Assistive device  Small based quad cane    Gait Pattern  Poor foot clearance - right;Decreased stance time - right    Ambulation Surface  Level;Indoor    Number of Stairs  4    Height of Stairs  7    Gait Comments  CGA for stair negotiation.               PT Short Term Goals - 02/07/20 1122      PT SHORT TERM GOAL #1   Title  Patient will report understanding and regular compliance with HEP to improve strength, balance, and overall functional mobility.    Time  3    Period  Weeks    Status  Achieved    Target Date  01/17/20        PT Long Term Goals - 02/07/20 1122      PT LONG TERM GOAL #1   Title  Patient will demonstrate ability to perform sit to stand without upper extremity assistance indicating improved functional strength and balance.    Time  6    Period  Weeks    Status  Achieved      PT LONG TERM GOAL #2    Title  Patient will demonstrate improvement of at least 3 points on the DGI indicating improved balance for improved safety with functional mobility.    Baseline  02/07/20: See objective measures    Time  6    Period  Weeks    Status  Achieved      PT LONG TERM GOAL #3   Title  Patient will report improvement in endurance and functional mobility of at least 50%.    Baseline  02/07/20: Patient reported at least 50% improvement, but less than 75% improvement.    Time  6    Period  Weeks    Status  Achieved      PT LONG TERM GOAL #4   Title  Patient will ascend and descend at least 4 7'' steps with mod I in order to perform stair negotiation safely at home.    Baseline  02/07/20: Patient required CGA    Time  3    Period  Weeks    Status  On-going    Target Date  02/28/20      PT LONG TERM GOAL #5   Title  Patient will report improvement in endurance and functional mobility of at least 75%.    Time  3    Period  Weeks    Status  New    Target Date  02/28/20            Plan - 02/07/20 1254    Clinical Impression Statement  Performed a re-assessment of patient's progress in therapy. Patient achieved 1/1 short term goals. Patient achieved 3 out of 4 long term goals. An initial goal was added to continue progressing patient's endurance. Patient continues to be most limited by decreased endurance at this time, but has demonstrated improved balance and functional strength. Patient would benefit from continued skilled physical therapy for an additional 3 weeks to continue progressing patient's  balance, functional strength, and endurance.    Personal Factors and Comorbidities  Age;Comorbidity 3+    Comorbidities  CVA, cervical stenosis, depression    Examination-Activity Limitations  Bathing;Stand;Locomotion Level;Bed Mobility;Transfers;Stairs    Examination-Participation Restrictions  Yard Work;Cleaning;Meal Prep;Community Activity    Stability/Clinical Decision Making   Stable/Uncomplicated    Rehab Potential  Good    PT Frequency  2x / week    PT Duration  3 weeks    PT Treatment/Interventions  ADLs/Self Care Home Management;Aquatic Therapy;Cryotherapy;Electrical Stimulation;Moist Heat;DME Instruction;Gait training;Stair training;Functional mobility training;Therapeutic activities;Therapeutic exercise;Balance training;Neuromuscular re-education;Patient/family education;Orthotic Fit/Training;Manual techniques;Passive range of motion    PT Next Visit Plan  Take blood pressure as needed. Focus on gait training and dynamic balance with a focus on increasing patient's endurance.    PT Home Exercise Plan  01/01/20:  bridges, SLR, seated toeraises and LAQ; 01/10/20: Tandem stance balance each LE forward daily    Consulted and Agree with Plan of Care  Patient       Patient will benefit from skilled therapeutic intervention in order to improve the following deficits and impairments:  Abnormal gait, Decreased knowledge of use of DME, Improper body mechanics, Decreased coordination, Decreased mobility, Postural dysfunction, Decreased activity tolerance, Decreased endurance, Decreased strength, Decreased balance, Difficulty walking  Visit Diagnosis: Other symptoms and signs involving the nervous system  Other abnormalities of gait and mobility  Muscle weakness (generalized)     Problem List Patient Active Problem List   Diagnosis Date Noted  . 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[place]) 01/13/2020  . Acute right-sided weakness 01/13/2020  . Difficulty urinating 01/13/2020  . Stroke ([place])   . CVA (cerebral vascular accident) ([place]) 12/20/2019  . 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[place])   . PVC (premature ventricular contraction)   . Cervical stenosis of spinal canal   . Failure to thrive in adult 05/26/2017  . Failure to thrive (0-17) 05/25/2017  . Abdominal pulsatile mass 05/25/2017  . Anemia 05/25/2017  . Essential hypertension 05/25/2017  . Gait instability 05/20/2017  . Frequent  falls 05/20/2017  . Depression 05/20/2017   Clarene Critchley PT, DPT 12:56 PM, 02/07/20 [place] [address] [place], [place], [address] Phone: [phone]   Fax:  [phone]  Name: KADON ANDRUS MRN: [id number] Date of Birth: 1941-01-02

## 2020-02-12 ENCOUNTER — Ambulatory Visit (HOSPITAL_COMMUNITY): Payer: Medicare Other | Admitting: Speech Pathology

## 2020-02-12 ENCOUNTER — Other Ambulatory Visit: Payer: Self-pay

## 2020-02-12 ENCOUNTER — Encounter (HOSPITAL_COMMUNITY): Payer: Self-pay | Admitting: Speech Pathology

## 2020-02-12 DIAGNOSIS — R2689 Other abnormalities of gait and mobility: Secondary | ICD-10-CM | POA: Diagnosis not present

## 2020-02-12 DIAGNOSIS — M6281 Muscle weakness (generalized): Secondary | ICD-10-CM | POA: Diagnosis not present

## 2020-02-12 DIAGNOSIS — R29818 Other symptoms and signs involving the nervous system: Secondary | ICD-10-CM | POA: Diagnosis not present

## 2020-02-12 DIAGNOSIS — R41841 Cognitive communication deficit: Secondary | ICD-10-CM

## 2020-02-12 DIAGNOSIS — R278 Other lack of coordination: Secondary | ICD-10-CM | POA: Diagnosis not present

## 2020-02-12 DIAGNOSIS — R29898 Other symptoms and signs involving the musculoskeletal system: Secondary | ICD-10-CM | POA: Diagnosis not present

## 2020-02-12 NOTE — Therapy (Signed)
[place], [place], [address] Phone: [phone]   Fax:  [phone]  Speech Language Pathology Treatment  Patient Details  Name: Devin Valdez MRN: [id number] Date of Birth: 1940/11/24 Referring Provider (SLP): Jenna Luo, MD   Encounter Date: 02/12/2020  End of Session - 02/12/20 0941    Visit Number  6    Number of Visits  9    Authorization Type  Medicare    SLP Start Time  0908    SLP Stop Time   1194    SLP Time Calculation (min)  41 min    Activity Tolerance  Patient tolerated treatment well       Past Medical History:  Diagnosis Date  . Anemia   . Anxiety   . 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 Depression   . Falls   . Hallucinations   . Hyperlipidemia   . Hypertension   . Stroke [facility])    left paracentral pons infarct    Past Surgical History:  Procedure Laterality Date  . EYE SURGERY    . HERNIA REPAIR    . POSTERIOR CERVICAL LAMINECTOMY     with fusion C3-C7 10/19- Dr. Brigitte Pulse at [facility]    There were no vitals filed for this visit.  Subjective Assessment - 02/12/20 0915    Subjective  "We have a lot going on at home."    Currently in Pain?  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  Patient is currently experiencing impaired working memory. Dr. Dennard Schaumann has referred patient for SLP evaluation and treatment.        Treatment Provided   Treatment provided  Cognitive-Linquistic      Pain Assessment   Pain Assessment  No/denies pain      Cognitive-Linquistic Treatment   Treatment focused on  Cognition;Dysarthria;Patient/family/caregiver education    Skilled Treatment  SLP provided skilled treatment targeting attention, memory, and speech intelligibility. Pt did not complete HEP assigned and needed assist organizing his folder. SLP provided a calendar with appointments and a template for a daily schedule/rourtine. Pt continues to be primarily focused on his reduced physical endurance. He was encouraged to add specific times each day to complete his exercises (will discuss with PT). He completed working memory task with 90% acc with min cues with use of association strategy.     Assessment / Recommendations / Plan   Plan  Continue with current plan of care      Progression Toward Goals   Progression toward goals  Progressing toward goals        SLP Short Term Goals - 02/12/20 0948      SLP SHORT TERM GOAL #1   Title  Pt will implement memory strategies in functional therapy activities with 90% acc with mi/mod cues.    Baseline  75%    Time  4    Status  On-going    Target Date  02/27/20      SLP SHORT TERM GOAL #2   Title  Pt will complete selective and  alternating attention tasks (moderately complex) with 90% acc and mi/mod cues.    Baseline  mod cues    Time  4    Period  Weeks    Status  On-going    Target Date  02/27/20      SLP SHORT TERM GOAL #3   Title  Pt will implement speech intelligibility strategies during conversational speech (decrease rate, self correct errors, self monitoring, checking to see if listener understands) with min assist.    Baseline  mod assist    Time  4    Period  Weeks    Status  On-going    Target Date  02/27/20       SLP Long Term Goals - 02/12/20 0948      SLP LONG TERM GOAL #1   Title  Pt will increase speech intelligibility to [facility] for small group setting with use of compensatory strategies as needed.    Baseline  min impairment    Time  1    Period  Months    Status  On-going      SLP LONG TERM GOAL #2   Title  Pt will increase working memory skills to [facility] for recall of personal/  functional information with use of strategies as needed.    Baseline  mi/mod impairment    Time  1    Period  Months    Status  On-going       Plan - 02/12/20 1062    Clinical Impression Statement Pt with limited completion of HEP despite written details to complete. Tomorrow will be our final SLP session in preparation for discharge to HEP.    Speech Therapy Frequency  2x / week    Duration  4 weeks    Treatment/Interventions  Cognitive reorganization;SLP instruction and feedback;Compensatory strategies;Internal/external aids;Patient/family education;Compensatory techniques;Cueing hierarchy    Potential to Achieve Goals  Good    Potential Considerations  Ability to learn/carryover information    SLP Home Exercise Plan  Pt will complete HEP as assigned to facilitate carryover of treatment strategies and techniques in home environment with use of written cues.    Consulted and Agree with Plan of Care  Patient;Family member/caregiver    Family Member Consulted  Wife, Bethena Roys       Patient will benefit from skilled therapeutic intervention in order to improve the following deficits and impairments:   Cognitive communication deficit    Problem List Patient Active Problem List   Diagnosis Date Noted  . 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[place]) 01/13/2020  . Acute right-sided weakness 01/13/2020  . Difficulty urinating 01/13/2020  . Stroke ([place])   . CVA (cerebral vascular accident) ([place]) 12/20/2019  . 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[place])   . PVC (premature ventricular contraction)   . Cervical stenosis of spinal canal   . Failure to thrive in adult 05/26/2017  . Failure to thrive (0-17) 05/25/2017  . Abdominal pulsatile mass 05/25/2017  . Anemia 05/25/2017  . Essential hypertension 05/25/2017  . Gait instability 05/20/2017  . Frequent falls 05/20/2017  . Depression 05/20/2017   Thank you,  Genene Churn, [place]  [facility] 02/12/2020, 9:51 AM  [place], [place], [address] Phone: [phone]   Fax:  [phone]   Name: Devin Valdez MRN: [id number] Date of Birth: 11-20-1941

## 2020-02-13 ENCOUNTER — Other Ambulatory Visit: Payer: Self-pay | Admitting: Family Medicine

## 2020-02-13 ENCOUNTER — Ambulatory Visit (HOSPITAL_COMMUNITY): Payer: Medicare Other | Admitting: Speech Pathology

## 2020-02-13 ENCOUNTER — Encounter (HOSPITAL_COMMUNITY): Payer: Self-pay | Admitting: Speech Pathology

## 2020-02-13 DIAGNOSIS — R41841 Cognitive communication deficit: Secondary | ICD-10-CM | POA: Diagnosis not present

## 2020-02-13 DIAGNOSIS — M6281 Muscle weakness (generalized): Secondary | ICD-10-CM | POA: Diagnosis not present

## 2020-02-13 DIAGNOSIS — R2689 Other abnormalities of gait and mobility: Secondary | ICD-10-CM | POA: Diagnosis not present

## 2020-02-13 DIAGNOSIS — R29898 Other symptoms and signs involving the musculoskeletal system: Secondary | ICD-10-CM | POA: Diagnosis not present

## 2020-02-13 DIAGNOSIS — R278 Other lack of coordination: Secondary | ICD-10-CM | POA: Diagnosis not present

## 2020-02-13 DIAGNOSIS — R29818 Other symptoms and signs involving the nervous system: Secondary | ICD-10-CM | POA: Diagnosis not present

## 2020-02-13 NOTE — Therapy (Signed)
[place], [place], [address] Phone: [phone]   Fax:  [phone]  Speech Language Pathology Treatment  Patient Details  Name: Devin Valdez MRN: [id number] Date of Birth: [birth year] Referring Provider (SLP): Jenna Luo, MD   Encounter Date: 02/13/2020  End of Session - 02/13/20 1140    Visit Number  7    Number of Visits  9    Authorization Type  Medicare    SLP Start Time  1130    SLP Stop Time   5537    SLP Time Calculation (min)  45 min    Activity Tolerance  Patient tolerated treatment well       Past Medical History:  Diagnosis Date  . Anemia   . Anxiety   . 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 Depression   . Falls   . Hallucinations   . Hyperlipidemia   . Hypertension   . Stroke [facility])    left paracentral pons infarct    Past Surgical History:  Procedure Laterality Date  . EYE SURGERY    . HERNIA REPAIR    . POSTERIOR CERVICAL LAMINECTOMY     with fusion C3-C7 10/19- Dr. Brigitte Pulse at [facility]    There were no vitals filed for this visit.  Subjective Assessment - 02/13/20 1136    Subjective  "I get confused." (Pt thought he had PT today)    Currently in Pain?  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  Patient is currently experiencing impaired working memory. Dr. Dennard Schaumann has referred patient for SLP evaluation and treatment.        Treatment Provided   Treatment provided  Cognitive-Linquistic      Pain Assessment   Pain Assessment  No/denies pain      Cognitive-Linquistic Treatment   Treatment focused on  Cognition;Dysarthria;Patient/family/caregiver  education    Skilled Treatment  SLP provided skilled treatment targeting attention, memory, and speech intelligibility goals. Pt. Completed memory task with 80% acc independently and 100% with min cues. He reports feeling distracted by other stressors in his life (his son has MS and lives with them) and not being able to physically do what he wants to do (work in the yard). He has not been completing HEP. This was reviewed with his wife today and he was encouraged to add activities to his daily schedule provided last session.       Assessment / Recommendations / Plan   Plan  Discharge SLP treatment due to (comment)      Progression Toward Goals   Progression toward goals  Goals met, education completed, patient discharged from [place] - 02/13/20 [place] #1   Title  Pt will implement memory strategies in functional therapy activities with 90% acc with mi/mod cues.    Baseline  75%    Time  4    Status  Partially Met    Target Date  02/27/20  SLP SHORT TERM GOAL #2   Title  Pt will complete selective and alternating attention tasks (moderately complex) with 90% acc and mi/mod cues.    Baseline  mod cues    Time  4    Period  Weeks    Status  Achieved    Target Date  02/27/20      SLP SHORT TERM GOAL #3   Title  Pt will implement speech intelligibility strategies during conversational speech (decrease rate, self correct errors, self monitoring, checking to see if listener understands) with min assist.    Baseline  mod assist    Time  4    Period  Weeks    Status  Achieved    Target Date  02/27/20       SLP Long Term Goals - 02/13/20 1142      SLP LONG TERM GOAL #1   Title  Pt will increase speech intelligibility to [facility] for small group setting with use of compensatory strategies as needed.    Baseline  min impairment    Time  1    Period  Months    Status  Achieved      SLP LONG TERM GOAL #2   Title  Pt will increase  working memory skills to [facility] for recall of personal/ functional information with use of strategies as needed.    Baseline  mi/mod impairment    Time  1    Period  Months    Status  Achieved       Plan - 02/13/20 1140    Clinical Impression Statement Pt seen for his final SLP session this date and released to home program (review material presented in sessions, schedule times for cognitive tasks and physical activity). Pt continues to be primarily concerned about his physical endurance. He was encouraged to discuss with his PT tomorrow and to incorporate memory strategies at home (most effective being writing information down and reviewing frequently). Pt partially met all goals, however carryover of HEP as been limited. Pt is pleased with his current level of progress in SLP and will be discharged from skilled SLP services at this time.    Treatment/Interventions  Cognitive reorganization;SLP instruction and feedback;Compensatory strategies;Internal/external aids;Patient/family education;Compensatory techniques;Cueing hierarchy    Potential to Achieve Goals  Good    Potential Considerations  Ability to learn/carryover information    SLP Home Exercise Plan  Pt will complete HEP as assigned to facilitate carryover of treatment strategies and techniques in home environment with use of written cues.    Consulted and Agree with Plan of Care  Patient;Family member/caregiver    Family Member Consulted  Wife, Bethena Roys       Patient will benefit from skilled therapeutic intervention in order to improve the following deficits and impairments:   Cognitive communication deficit    Problem List Patient Active Problem List   Diagnosis Date Noted  . 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[place]) 01/13/2020  . Acute right-sided weakness 01/13/2020  . Difficulty urinating 01/13/2020  . Stroke ([place])   . CVA (cerebral vascular accident) ([place]) 12/20/2019  . 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 PVC (premature ventricular  contraction)   . Cervical stenosis of spinal canal   . Failure to thrive in adult 05/26/2017  . Failure to thrive (0-17) 05/25/2017  . Abdominal pulsatile mass 05/25/2017  . Anemia 05/25/2017  . Essential hypertension 05/25/2017  . Gait instability 05/20/2017  . Frequent falls 05/20/2017  . Depression 05/20/2017   SPEECH THERAPY DISCHARGE  SUMMARY  Visits from Start of Care: 7  Current functional level related to goals / functional outcomes: See above   Remaining deficits: Mild attention and working memory deficits   Education / Equipment: completed  Plan: Patient agrees to discharge.  Patient goals were partially met. Patient is being discharged due to being pleased with the current functional level.  ?????         Thank you,  Genene Churn, [place]  [facility] 02/13/2020, 11:42 AM  [place] [address] [place], [place], [address] Phone: [phone]   Fax:  [phone]   Name: Devin Valdez MRN: [id number] Date of Birth: 02/01/1941

## 2020-02-14 ENCOUNTER — Other Ambulatory Visit: Payer: Self-pay

## 2020-02-14 ENCOUNTER — Ambulatory Visit (HOSPITAL_COMMUNITY): Payer: Medicare Other | Admitting: Physical Therapy

## 2020-02-14 ENCOUNTER — Ambulatory Visit (INDEPENDENT_AMBULATORY_CARE_PROVIDER_SITE_OTHER): Payer: Medicare Other | Admitting: Family Medicine

## 2020-02-14 ENCOUNTER — Encounter (HOSPITAL_COMMUNITY): Payer: Self-pay | Admitting: Physical Therapy

## 2020-02-14 VITALS — BP 118/64 | HR 66 | Temp 97.1°F | Resp 12 | Ht 67.0 in | Wt 136.0 lb

## 2020-02-14 DIAGNOSIS — M6281 Muscle weakness (generalized): Secondary | ICD-10-CM | POA: Diagnosis not present

## 2020-02-14 DIAGNOSIS — R278 Other lack of coordination: Secondary | ICD-10-CM | POA: Diagnosis not present

## 2020-02-14 DIAGNOSIS — R29898 Other symptoms and signs involving the musculoskeletal system: Secondary | ICD-10-CM | POA: Diagnosis not present

## 2020-02-14 DIAGNOSIS — R2689 Other abnormalities of gait and mobility: Secondary | ICD-10-CM

## 2020-02-14 DIAGNOSIS — G5603 Carpal tunnel syndrome, bilateral upper limbs: Secondary | ICD-10-CM

## 2020-02-14 DIAGNOSIS — I6930 Unspecified sequelae of cerebral infarction: Secondary | ICD-10-CM | POA: Diagnosis not present

## 2020-02-14 DIAGNOSIS — R29818 Other symptoms and signs involving the nervous system: Secondary | ICD-10-CM

## 2020-02-14 DIAGNOSIS — R41841 Cognitive communication deficit: Secondary | ICD-10-CM | POA: Diagnosis not present

## 2020-02-14 NOTE — Therapy (Signed)
[place], [place], [address] Phone: [phone]   Fax:  [phone]  Physical Therapy Treatment  Patient Details  Name: Devin Valdez MRN: [id number] Date of Birth: Apr 05, 1941 Referring Provider (PT): [name]   Encounter Date: 02/14/2020  PT End of Session - 02/14/20 0949    Visit Number  11    Number of Visits  18    Date for PT Re-Evaluation  02/28/20    Authorization Type  Primary: Medicare; Secondary: [employer]    Authorization Time Period  12/27/19 - 02/07/20; 02/07/20 - 02/28/20    Progress Note Due on Visit  20    PT Start Time  0943    PT Stop Time  1021    PT Time Calculation (min)  38 min    Equipment Utilized During Treatment  Gait belt    Activity Tolerance  Patient limited by fatigue;Patient tolerated treatment well    Behavior During Therapy  [facility] for tasks assessed/performed       Past Medical History:  Diagnosis Date  . Anemia   . Anxiety   . 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 Depression   . Falls   . Hallucinations   . Hyperlipidemia   . Hypertension   . Stroke [facility])    left paracentral pons infarct    Past Surgical History:  Procedure Laterality Date  . EYE SURGERY    . HERNIA REPAIR    . POSTERIOR CERVICAL LAMINECTOMY     with fusion C3-C7 10/19- Dr. Brigitte Pulse at [facility]    There were no vitals filed for this visit.  Subjective Assessment - 02/14/20 0946    Subjective  Patient reported no new updates. Denied any pain.    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  No/denies                       [facility] Adult PT Treatment/Exercise - 02/14/20 0001      Ambulation/Gait   Ambulation/Gait  Yes    Ambulation Distance (Feet)  226 Feet    Assistive device  Small based quad cane    Gait Pattern  Poor foot clearance - right;Decreased stance time - right      Knee/Hip Exercises: Standing   Heel Raises  Both;2 sets;10 reps    Heel Raises Limitations  Toe raises 2 x 10 both    Lateral Step Up  Right;Left;1 set;Hand Hold: 2;Step Height: 4"    Lateral Step Up Limitations  8 reps    Forward Step Up  Right;Left;1 set;Step Height: 4";Hand Hold: 2;Other (comment)    Forward Step Up Limitations  8 reps    Other Standing Knee Exercises  --   [place] reaching 180 deg. turn place on table 2x5 each LT/RT     Knee/Hip Exercises: Seated   Sit to Sand  2 sets;5 reps;without UE support               PT Short Term Goals - 02/07/20 1122      PT SHORT TERM GOAL #1   Title  Patient will  report understanding and regular compliance with HEP to improve strength, balance, and overall functional mobility.    Time  3    Period  Weeks    Status  Achieved    Target Date  01/17/20        PT Long Term Goals - 02/07/20 1122      PT LONG TERM GOAL #1   Title  Patient will demonstrate ability to perform sit to stand without upper extremity assistance indicating improved functional strength and balance.    Time  6    Period  Weeks    Status  Achieved      PT LONG TERM GOAL #2   Title  Patient will demonstrate improvement of at least 3 points on the DGI indicating improved balance for improved safety with functional mobility.    Baseline  02/07/20: See objective measures    Time  6    Period  Weeks    Status  Achieved      PT LONG TERM GOAL #3   Title  Patient will report improvement in endurance and functional mobility of at least 50%.    Baseline  02/07/20: Patient reported at least 50% improvement, but less than 75% improvement.    Time  6    Period  Weeks    Status  Achieved      PT LONG TERM GOAL #4   Title  Patient will ascend and descend at  least 4 7'' steps with mod I in order to perform stair negotiation safely at home.    Baseline  02/07/20: Patient required CGA    Time  3    Period  Weeks    Status  On-going    Target Date  02/28/20      PT LONG TERM GOAL #5   Title  Patient will report improvement in endurance and functional mobility of at least 75%.    Time  3    Period  Weeks    Status  New    Target Date  02/28/20            Plan - 02/14/20 1029    Clinical Impression Statement  Began by checking patient's BP which was found to be 132/62 with a HR of 66. Patient denied any new symptoms, reporting feeling the same as previous visits. This session focused on dynamic balance including turning to place cones while making a 180 degree turn performing each direction. This session, was able to increase the number of repetitions with step-ups and also increased the distance ambulated. Patient would benefit from continued skilled physical therapy to continue progressing towards functional goals.    Personal Factors and Comorbidities  Age;Comorbidity 3+    Comorbidities  CVA, cervical stenosis, depression    Examination-Activity Limitations  Bathing;Stand;Locomotion Level;Bed Mobility;Transfers;Stairs    Examination-Participation Restrictions  Yard Work;Cleaning;Meal Prep;Community Activity    Stability/Clinical Decision Making  Stable/Uncomplicated    Rehab Potential  Good    PT Frequency  2x / week    PT Duration  3 weeks    PT Treatment/Interventions  ADLs/Self Care Home Management;Aquatic Therapy;Cryotherapy;Electrical Stimulation;Moist Heat;DME Instruction;Gait training;Stair training;Functional mobility training;Therapeutic activities;Therapeutic exercise;Balance training;Neuromuscular re-education;Patient/family education;Orthotic Fit/Training;Manual techniques;Passive range of motion    PT Next Visit Plan  Take blood pressure as needed. Focus on gait training and dynamic balance with a focus on increasing patient's  endurance.    PT Home Exercise Plan  01/01/20:  bridges, SLR, seated toeraises and LAQ; 01/10/20: Tandem stance balance  each LE forward daily; 02/14/20: STS 5x 2-4 reps daily    Consulted and Agree with Plan of Care  Patient       Patient will benefit from skilled therapeutic intervention in order to improve the following deficits and impairments:  Abnormal gait, Decreased knowledge of use of DME, Improper body mechanics, Decreased coordination, Decreased mobility, Postural dysfunction, Decreased activity tolerance, Decreased endurance, Decreased strength, Decreased balance, Difficulty walking  Visit Diagnosis: Other symptoms and signs involving the nervous system  Other abnormalities of gait and mobility  Muscle weakness (generalized)     Problem List Patient Active Problem List   Diagnosis Date Noted  . 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[place]) 01/13/2020  . Acute right-sided weakness 01/13/2020  . Difficulty urinating 01/13/2020  . Stroke ([place])   . CVA (cerebral vascular accident) ([place]) 12/20/2019  . 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[place])   . PVC (premature ventricular contraction)   . Cervical stenosis of spinal canal   . Failure to thrive in adult 05/26/2017  . Failure to thrive (0-17) 05/25/2017  . Abdominal pulsatile mass 05/25/2017  . Anemia 05/25/2017  . Essential hypertension 05/25/2017  . Gait instability 05/20/2017  . Frequent falls 05/20/2017  . Depression 05/20/2017   Clarene Critchley PT, DPT 10:31 AM, 02/14/20 [place], [place], [address] Phone: [phone]   Fax:  [phone]  Name: Devin Valdez MRN: [id number] Date of Birth: 01-12-1941

## 2020-02-14 NOTE — Patient Instructions (Signed)
SIT TO STAND: Feet Narrow    Place feet close together. Lean chest forward. Raise hips and straighten knees to stand. _5__ reps per set, __2-4_ sets per day, _7__ days per week Hold onto a support as needed, try without arms.  Copyright  VHI. All rights reserved.

## 2020-02-14 NOTE — Progress Notes (Signed)
Subjective:    Patient ID: Devin Valdez, male    DOB: [birth year], 79 y.o.   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  I have copied relevant portions of his discharge summary below and included them for my reference:   Admit date: 01/13/2020 Discharge date: 01/14/2020   Recommendations for Outpatient Follow-up:  1. Follow-up with Dr. Leonie Man, as previously scheduled on 01/16/2020. 2. Follow-up with [name], MD on 01/20/2020.  Patient's blood pressure will need to be reassessed on follow-up and per neurology goal was to keep systolic blood pressure greater than 140.  Patient will need a basic metabolic profile done on follow-up to follow-up on electrolytes and renal function.  Patient has difficulty urinating will need to be followed up upon.   Discharge Diagnoses:  Principal Problem:   Extension of stroke [facility]) Active Problems:   Gait instability   Acute right-sided weakness   Difficulty urinating  History of present illness:  HPI per Dr. Evangeline Gula Kendrix P [name] a 78 y.o.malewith medical history significant ofhypertension, anemia of chronic renal failure, chronic kidney disease stage IV, and hypertension who had a stroke in January 2021 who presents to the emergency department complaining of bilateral lower extremity weakness. The extremity weakness has been worsening over the past 48 hours. He was recently in the hospital and had the same deficit. He feels like his stroke has reoccurred. After discharge he went home with outpatient physical therapy and has completed 3 weeks of therapy. He was started on aspirin and Plavix for 3 weeks. He completed his 3 weeks of Plavix. He has follow-up with the neurologist tomorrow and then was sitting further rehabilitation. Has had no fever, nausea, cough, congestion, vomiting, or diarrhea. Is been drinking a little less over the past 24 hours but cannot quantify. He  does not have any abdominal pain or blood in his stools. He takes iron and has chronically dark stools. Severity of his symptoms were moderate onset was gradual. It is been present for 2 days and constant. He feels that it has been worsening. There is a recurrence he of the symptoms. Nothing has made it better nothing has made it worse. He has no associated symptoms as noted above. Neurology was consulted by the emergency department who recommended getting an MRI of the brain. 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 Patient complains of inability to make urine which was not a problem before. Patient thinks he has not been drinking much water and may be dehydrated. Stroke extensions usually occur due to hypotension but patient does not recall any episodes of feeling dizzy or hypotensive or passing out. Patient was discharged on 12/21/2019 on dual antiplatelet therapy. He received outpatient physical therapy at [facility]. Neurology diagnosed the patient with an extension of existing recent pontine stroke and recommended restarting dual antiplatelet therapy and observation.  Hospital Course:  1 extension of CVA Patient noted to have an extension of his stroke as he had presented with worsening lower extremity weakness MRI of the head was consistent with extension of a pontine stroke.  Patient denied any hypotensive spells, nausea or vomiting.  Patient was seen by PT /OT and ST and recommendations were for outpatient therapy.  Patient noted to have an LDL of 117 on January 2021 during last hospitalization.  Neurology was consulted and patient seen in consultation by Dr. [name] who recommended continuation of dual antiplatelet therapy of aspirin and Plavix, for 3  weeks, and then Plavix daily.  It was felt no need to repeat stroke work-up and to keep systolic blood pressure greater than 140s.  Patient remained in stable condition.  Patient will be discharged in stable improved condition and is  to follow-up with Dr. Leonie Man, neurology as previously scheduled on 01/16/2020.  2.  Hypertension Remained stable.  Patient maintained on home regimen of Cozaar.  3.  Gait instability Secondary to problem #1.  Patient was seen by PT/OT.  Outpatient PT recommended.  4.  Difficulty urinating On admission patient did have some complaints of difficulty urinating however did state had improved by day of discharge.  Procedures:  MRI 01/13/2020  Consultations:  Neurology: Dr.Arora 01/13/2020 01/20/20 Patient is here today for follow-up with his wife.  He states that prior to going to the hospital he was not drinking very much.  He was having a difficult time urinating.  He believes he was dehydrated.  This caused his blood pressure dropped and he believes this is what led to extension of his CVA.  Since discharge from the hospital, the patient states that he is doing better.  He still feels extremely weak and tired.  He occasionally slurs his speech.  He denies choking after eating or drinking.  He denies any falls.  He does have some slight deficiency in his balance with some mild right-sided weakness in his arms and his legs but otherwise is improving.  He is working with physical therapy.  His blood pressure is in the 315-176 range systolic.  His wife denies any hypotensive episodes since discharge from the hospital.  He is eating and drinking well.  At that time, my plan was: I have recommended that we try to maintain his systolic blood pressure around 140 for the next month.  I want to avoid extension of his stroke in the vulnerable peri-infarct area.  Therefore I want to avoid hypotension.  Of asked him to check his blood pressure daily.  If his blood pressure is low, I encouraged him to drink Gatorade to maintain his hydration and if his blood pressure is extremely low, we may need to temporarily discontinue his losartan.  Check CBC, CMP today to monitor his renal function.  In 1 month, we would  start to try to ensure that we keep his systolic blood pressure less than 160 and his diastolic blood pressure less than 90 however for the first month will allow permissive hypertension.  Continue to work with physical therapy.  02/14/20 Patient presents with bilateral hand pain left greater than right.  The pain is distal to the wrist both hands.  He reports pins-and-needles and burning sensations in both hands.  This primarily involves the thumb, index finger, and the third finger.  He also reports weakness in his hands.  There is no erythema or swelling in his MCP, PIP, or DIP joints.  He does have a positive Phalen sign.  He has a negative Tinel's sign.  He has normal grip strength.  There is no thenar eminence wasting.  He has no crepitus.  There is no decreased range of motion in his hands.  Past Medical History:  Diagnosis Date  . Anemia   . Anxiety   . 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 Depression   . Falls   . Hallucinations   . Hyperlipidemia   . Hypertension   . Stroke [facility])    left paracentral pons infarct   Past Surgical  History:  Procedure Laterality Date  . EYE SURGERY    . HERNIA REPAIR    . POSTERIOR CERVICAL LAMINECTOMY     with fusion C3-C7 10/19- Dr. Brigitte Pulse at [facility]   Current Outpatient Medications on File Prior to Visit  Medication Sig Dispense Refill  . acetaminophen (TYLENOL) 325 MG tablet Take 650 mg by mouth every 6 (six) hours as needed for mild pain or headache.    Marland Kitchen atorvastatin (LIPITOR) 40 MG tablet TAKE ONE TABLET (40MG  TOTAL) BY MOUTH DAILY AT 6PM 30 tablet 0  . clopidogrel (PLAVIX) 75 MG tablet Take 1 tablet (75 mg total) by mouth daily. 30 tablet 2  . Coenzyme Q10 (CO Q-10) 200 MG CAPS Take by mouth daily.    . ferrous sulfate 325 (65 FE) MG tablet Take 1 tablet (325 mg total) by mouth 2 (two) times daily with a meal. (Patient taking differently: Take 325 mg by mouth daily with breakfast. ) 60 tablet 1  . losartan (COZAAR) 50  MG tablet TAKE ONE (1) TABLET BY MOUTH EVERY DAY FOR BLOOD PRESSURE (Patient taking differently: Take 50 mg by mouth daily. ) 90 tablet 3  . Multiple Vitamins-Minerals (CENTRUM SILVER ADULT 50+) TABS Take 1 capsule by mouth 2 (two) times daily. 30 tablet 2  . sildenafil (VIAGRA) 100 MG tablet TAKE ONE TABLET (100MG  TOTAL) BY MOUTH DAILY AS NEEDED FOR ERECTILE DYSFUNCTION (Patient taking differently: Take 100 mg by mouth as needed for erectile dysfunction. ) 10 tablet 5   No current facility-administered medications on file prior to visit.   No Known Allergies Social History   Socioeconomic History  . Marital status: Married    Spouse name: Not on file  . Number of children: Not on file  . Years of education: Not on file  . Highest education level: Not on file  Occupational History  . Not on file  Tobacco Use  . Smoking status: Former Smoker    Quit date: 11/21/1985    Years since quitting: 34.2  . Smokeless tobacco: Never Used  Substance and Sexual Activity  . Alcohol use: No  . Drug use: No  . Sexual activity: Yes  Other Topics Concern  . Not on file  Social History Narrative  . Not on file   Social Determinants of Health   Financial Resource Strain:   . Difficulty of Paying Living Expenses:   Food Insecurity:   . Worried About Charity fundraiser in the Last Year:   . Arboriculturist in the Last Year:   Transportation Needs:   . Film/video editor (Medical):   Marland Kitchen Lack of Transportation (Non-Medical):   Physical Activity:   . Days of Exercise per Week:   . Minutes of Exercise per Session:   Stress:   . Feeling of Stress :   Social Connections:   . Frequency of Communication with Friends and Family:   . Frequency of Social Gatherings with Friends and Family:   . Attends Religious Services:   . Active Member of Clubs or Organizations:   . Attends Archivist Meetings:   Marland Kitchen Marital Status:   Intimate Partner Violence:   . Fear of Current or Ex-Partner:     . Emotionally Abused:   Marland Kitchen Physically Abused:   . Sexually Abused:     Review of Systems  All other systems reviewed and are negative.      Objective:   Physical Exam Constitutional:      Appearance:  Normal appearance. He is normal weight.  Cardiovascular:     Rate and Rhythm: Normal rate and regular rhythm.     Heart sounds: Normal heart sounds. No murmur. No friction rub. No gallop.   Pulmonary:     Effort: Pulmonary effort is normal. No respiratory distress.     Breath sounds: Normal breath sounds. No stridor. No wheezing, rhonchi or rales.  Chest:     Chest wall: No tenderness.  Musculoskeletal:     Right hand: No swelling, deformity, lacerations, tenderness or bony tenderness. Normal range of motion. Decreased sensation of the median distribution. Normal pulse.     Left hand: No swelling, deformity, lacerations, tenderness or bony tenderness. Normal range of motion. Normal strength. Decreased sensation of the median distribution. Normal pulse.     Right lower leg: No edema.     Left lower leg: No edema.  Neurological:     Mental Status: He is alert.           Assessment & Plan:  Bilateral carpal tunnel syndrome  I believe the pain in his hands is due to carpal tunnel syndrome bilaterally.  He is unable to tolerate NSAIDs due to his antiplatelet agent.  Patient agreed to a cortisone injection in his left wrist.  Using sterile technique, I injected 1 cc of lidocaine and 1 cc of 40 mg/mL Kenalog into the left carpal tunnel through the distal flexor crease at the wrist after aspirating to ensure that we are not injecting into a blood vessel.  Patient tolerated procedure well without complication.  If the pain improves by next week he could return for repeat injection in his right wrist.

## 2020-02-17 ENCOUNTER — Ambulatory Visit (HOSPITAL_COMMUNITY): Payer: Medicare Other | Admitting: Speech Pathology

## 2020-02-19 ENCOUNTER — Ambulatory Visit (HOSPITAL_COMMUNITY): Payer: Medicare Other | Admitting: Speech Pathology

## 2020-02-19 ENCOUNTER — Encounter (HOSPITAL_COMMUNITY): Payer: Self-pay | Admitting: Physical Therapy

## 2020-02-19 ENCOUNTER — Ambulatory Visit (HOSPITAL_COMMUNITY): Payer: Medicare Other | Admitting: Physical Therapy

## 2020-02-19 ENCOUNTER — Other Ambulatory Visit: Payer: Self-pay

## 2020-02-19 DIAGNOSIS — R29818 Other symptoms and signs involving the nervous system: Secondary | ICD-10-CM

## 2020-02-19 DIAGNOSIS — R29898 Other symptoms and signs involving the musculoskeletal system: Secondary | ICD-10-CM | POA: Diagnosis not present

## 2020-02-19 DIAGNOSIS — R2689 Other abnormalities of gait and mobility: Secondary | ICD-10-CM | POA: Diagnosis not present

## 2020-02-19 DIAGNOSIS — M6281 Muscle weakness (generalized): Secondary | ICD-10-CM | POA: Diagnosis not present

## 2020-02-19 DIAGNOSIS — R278 Other lack of coordination: Secondary | ICD-10-CM | POA: Diagnosis not present

## 2020-02-19 DIAGNOSIS — R41841 Cognitive communication deficit: Secondary | ICD-10-CM | POA: Diagnosis not present

## 2020-02-19 NOTE — Therapy (Signed)
[facility] [address] [place], [place], [address] Phone: [phone]   Fax:  [phone]  Physical Therapy Treatment  Patient Details  Name: Devin Valdez MRN: [id number] Date of Birth: [birth year] Referring Provider (PT): [name]   Encounter Date: 02/19/2020  PT End of Session - 02/19/20 1017    Visit Number  12    Number of Visits  18    Date for PT Re-Evaluation  02/28/20    Authorization Type  Primary: Medicare; Secondary: [employer]    Authorization Time Period  12/27/19 - 02/07/20; 02/07/20 - 02/28/20    Progress Note Due on Visit  20    PT Start Time  0950    PT Stop Time  1030    PT Time Calculation (min)  40 min    Equipment Utilized During Treatment  Gait belt    Activity Tolerance  Patient limited by fatigue;Patient tolerated treatment well    Behavior During Therapy  [facility] for tasks assessed/performed       Past Medical History:  Diagnosis Date  . Anemia   . Anxiety   . 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 Depression   . Falls   . Hallucinations   . Hyperlipidemia   . Hypertension   . Stroke [facility])    left paracentral pons infarct    Past Surgical History:  Procedure Laterality Date  . EYE SURGERY    . HERNIA REPAIR    . POSTERIOR CERVICAL LAMINECTOMY     with fusion C3-C7 10/19- Dr. Brigitte Pulse at [facility]    There were no vitals filed for this visit.  Subjective Assessment - 02/19/20 0952    Subjective  Patient reported  that he is having hand pain currently. He said he went to the MD for this, but that the shot he got in his left hand is not helping yet.    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  Yes    Pain Score  7     Pain Location  Hand    Pain Orientation  Right;Left    Pain Descriptors / Indicators  Aching                       [facility] Adult PT Treatment/Exercise - 02/19/20 0001      Ambulation/Gait   Ambulation/Gait  Yes    Ambulation Distance (Feet)  270 Feet   3 minutes of walking   Assistive device  Small based quad cane    Gait Pattern  Poor foot clearance - right;Decreased stance time - right      Knee/Hip Exercises: Standing   Other Standing Knee Exercises  --   Cone rotation 2 x 5 180 degree turn   Other Standing Knee Exercises  In // bars stepping over hurdles (3) 6-inch with min guard x 3 RT occassional catching of toes on hurdle. Tandem stance on foam each LE forward 20-30 seconds 3x each. Tandem ambulation 15' x 2      Knee/Hip Exercises: Seated   Sit to Sand  2 sets;5 reps;without UE support  PT Short Term Goals - 02/07/20 1122      PT SHORT TERM GOAL #1   Title  Patient will report understanding and regular compliance with HEP to improve strength, balance, and overall functional mobility.    Time  3    Period  Weeks    Status  Achieved    Target Date  01/17/20        PT Long Term Goals - 02/07/20 1122      PT LONG TERM GOAL #1   Title  Patient will demonstrate ability to perform sit to stand without upper extremity assistance indicating improved functional strength and balance.    Time  6    Period  Weeks    Status  Achieved      PT LONG TERM GOAL #2   Title  Patient will demonstrate improvement of at least 3 points on the DGI indicating improved balance for improved safety with functional mobility.    Baseline  02/07/20: See objective measures    Time  6    Period  Weeks    Status  Achieved      PT LONG TERM GOAL #3   Title  Patient will report improvement in endurance and functional mobility of at least 50%.    Baseline  02/07/20: Patient reported at least 50% improvement, but less than 75% improvement.     Time  6    Period  Weeks    Status  Achieved      PT LONG TERM GOAL #4   Title  Patient will ascend and descend at least 4 7'' steps with mod I in order to perform stair negotiation safely at home.    Baseline  02/07/20: Patient required CGA    Time  3    Period  Weeks    Status  On-going    Target Date  02/28/20      PT LONG TERM GOAL #5   Title  Patient will report improvement in endurance and functional mobility of at least 75%.    Time  3    Period  Weeks    Status  New    Target Date  02/28/20            Plan - 02/19/20 1130    Clinical Impression Statement  Measured patient's BP at the start of the session and it was found to be 150/72 with HR of 66 BPM. Patient demonstrated increased ability to ambulate this session with encouragement to continue ambulating for 3 minutes with 1 standing rest break. Patient demonstrated improved endurance and improved velocity with performance of exercises and less requests for rest breaks. Patient discussed hand pain frequently throughout session, seems to perseverate on this and states that it limits his ability to do his exercises. Patient seemed to become very discouraged any time he required intermittent UE assistance with balance activities, but was encouraged and was able to complete more exercises.    Personal Factors and Comorbidities  Age;Comorbidity 3+    Comorbidities  CVA, cervical stenosis, depression    Examination-Activity Limitations  Bathing;Stand;Locomotion Level;Bed Mobility;Transfers;Stairs    Examination-Participation Restrictions  Yard Work;Cleaning;Meal Prep;Community Activity    Stability/Clinical Decision Making  Stable/Uncomplicated    Rehab Potential  Good    PT Frequency  2x / week    PT Duration  3 weeks    PT Treatment/Interventions  ADLs/Self Care Home Management;Aquatic Therapy;Cryotherapy;Electrical Stimulation;Moist Heat;DME Instruction;Gait training;Stair training;Functional mobility training;Therapeutic  activities;Therapeutic exercise;Balance training;Neuromuscular re-education;Patient/family education;Orthotic Fit/Training;Manual  techniques;Passive range of motion    PT Next Visit Plan  Take blood pressure as needed. Focus on gait training and dynamic balance with a focus on increasing patient's endurance.    PT Home Exercise Plan  01/01/20:  bridges, SLR, seated toeraises and LAQ; 01/10/20: Tandem stance balance each LE forward daily; 02/14/20: STS 5x 2-4 reps daily    Consulted and Agree with Plan of Care  Patient       Patient will benefit from skilled therapeutic intervention in order to improve the following deficits and impairments:  Abnormal gait, Decreased knowledge of use of DME, Improper body mechanics, Decreased coordination, Decreased mobility, Postural dysfunction, Decreased activity tolerance, Decreased endurance, Decreased strength, Decreased balance, Difficulty walking  Visit Diagnosis: Other symptoms and signs involving the nervous system  Other abnormalities of gait and mobility  Muscle weakness (generalized)     Problem List Patient Active Problem List   Diagnosis Date Noted  . 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[place]) 01/13/2020  . Acute right-sided weakness 01/13/2020  . Difficulty urinating 01/13/2020  . Stroke ([place])   . CVA (cerebral vascular accident) ([place]) 12/20/2019  . 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[place])   . PVC (premature ventricular contraction)   . Cervical stenosis of spinal canal   . Failure to thrive in adult 05/26/2017  . Failure to thrive (0-17) 05/25/2017  . Abdominal pulsatile mass 05/25/2017  . Anemia 05/25/2017  . Essential hypertension 05/25/2017  . Gait instability 05/20/2017  . Frequent falls 05/20/2017  . Depression 05/20/2017   Clarene Critchley PT, DPT 11:33 AM, 02/19/20 [place] [address] [place], [place], [address] Phone: [phone]   Fax:  [phone]  Name: HANDSOME ANGLIN MRN:  [id number] Date of Birth: 10-16-1941

## 2020-02-21 ENCOUNTER — Ambulatory Visit (HOSPITAL_COMMUNITY): Payer: Medicare Other | Attending: Family Medicine | Admitting: Physical Therapy

## 2020-02-21 ENCOUNTER — Encounter (HOSPITAL_COMMUNITY): Payer: Self-pay | Admitting: Physical Therapy

## 2020-02-21 ENCOUNTER — Other Ambulatory Visit: Payer: Self-pay

## 2020-02-21 DIAGNOSIS — R29818 Other symptoms and signs involving the nervous system: Secondary | ICD-10-CM

## 2020-02-21 DIAGNOSIS — R2689 Other abnormalities of gait and mobility: Secondary | ICD-10-CM | POA: Diagnosis not present

## 2020-02-21 DIAGNOSIS — M6281 Muscle weakness (generalized): Secondary | ICD-10-CM | POA: Diagnosis not present

## 2020-02-21 NOTE — Therapy (Signed)
[place], [place], [address] Phone: [phone]   Fax:  [phone]  Physical Therapy Treatment  Patient Details  Name: Devin Valdez MRN: [id number] Date of Birth: 24-May-1941 Referring Provider (PT): [name]   Encounter Date: 02/21/2020  PT End of Session - 02/21/20 1057    Visit Number  13    Number of Visits  18    Date for PT Re-Evaluation  02/28/20    Authorization Type  Primary: Medicare; Secondary: UHC    Authorization Time Period  12/27/19 - 02/07/20; 02/07/20 - 02/28/20    Progress Note Due on Visit  20    PT Start Time  1031    PT Stop Time  1109    PT Time Calculation (min)  38 min    Equipment Utilized During Treatment  Gait belt    Activity Tolerance  Patient limited by fatigue;Patient tolerated treatment well    Behavior During Therapy  [facility] for tasks assessed/performed       Past Medical History:  Diagnosis Date  . Anemia   . Anxiety   . 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 Depression   . Falls   . Hallucinations   . Hyperlipidemia   . Hypertension   . Stroke [facility])    left paracentral pons infarct    Past Surgical History:  Procedure Laterality Date  . EYE SURGERY    . HERNIA REPAIR    . POSTERIOR CERVICAL LAMINECTOMY     with fusion C3-C7 10/19- Dr. Brigitte Pulse at [facility]    There were no vitals filed for this visit.  Subjective Assessment - 02/21/20 1041    Subjective  Patient reported that he is having some discomfort in his stomach due to something he ate, but otherwise doing well.    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  No/denies                        [facility] Adult PT Treatment/Exercise - 02/21/20 0001      Ambulation/Gait   Ambulation/Gait  Yes    Ambulation Distance (Feet)  270 Feet    Assistive device  Small based quad cane    Gait Pattern  Poor foot clearance - right;Decreased stance time - right    Gait Comments  For improved endurance cueing to pace-self      Knee/Hip Exercises: Standing   Other Standing Knee Exercises  --   Cone rotation 180 degress 2x5   Other Standing Knee Exercises  Retro walking 15' x 2. In // bars stepping over hurdles (3) 6-inch with min guard x 3 RT occassional catching of toes on hurdle. Tandem stance on foam each LE forward 20-30 seconds 3x each. Tandem ambulation 15' x 2      Knee/Hip Exercises: Seated   Sit to Sand  2 sets;5 reps;without UE support               PT Short Term Goals - 02/07/20 1122      PT SHORT TERM GOAL #1   Title  Patient will report  understanding and regular compliance with HEP to improve strength, balance, and overall functional mobility.    Time  3    Period  Weeks    Status  Achieved    Target Date  01/17/20        PT Long Term Goals - 02/07/20 1122      PT LONG TERM GOAL #1   Title  Patient will demonstrate ability to perform sit to stand without upper extremity assistance indicating improved functional strength and balance.    Time  6    Period  Weeks    Status  Achieved      PT LONG TERM GOAL #2   Title  Patient will demonstrate improvement of at least 3 points on the DGI indicating improved balance for improved safety with functional mobility.    Baseline  02/07/20: See objective measures    Time  6    Period  Weeks    Status  Achieved      PT LONG TERM GOAL #3   Title  Patient will report improvement in endurance and functional mobility of at least 50%.    Baseline  02/07/20: Patient reported at least 50% improvement, but less than 75% improvement.    Time  6    Period  Weeks    Status  Achieved      PT LONG  TERM GOAL #4   Title  Patient will ascend and descend at least 4 7'' steps with mod I in order to perform stair negotiation safely at home.    Baseline  02/07/20: Patient required CGA    Time  3    Period  Weeks    Status  On-going    Target Date  02/28/20      PT LONG TERM GOAL #5   Title  Patient will report improvement in endurance and functional mobility of at least 75%.    Time  3    Period  Weeks    Status  New    Target Date  02/28/20            Plan - 02/21/20 1121    Clinical Impression Statement  Continued with a focus on balance and endurance this session. Patient's BP was taken at the beginning of the session and found to be 127/69 with HR of 74 BPM. This session added retro ambulation to improve balance and proprioception which patient required min guard for. Patient was able to complete an increased number of exercises this session with fewer rest breaks. Plan to continue focusing on balance and endurance in upcoming sessions.    Personal Factors and Comorbidities  Age;Comorbidity 3+    Comorbidities  CVA, cervical stenosis, depression    Examination-Activity Limitations  Bathing;Stand;Locomotion Level;Bed Mobility;Transfers;Stairs    Examination-Participation Restrictions  Yard Work;Cleaning;Meal Prep;Community Activity    Stability/Clinical Decision Making  Stable/Uncomplicated    Rehab Potential  Good    PT Frequency  2x / week    PT Duration  3 weeks    PT Treatment/Interventions  ADLs/Self Care Home Management;Aquatic Therapy;Cryotherapy;Electrical Stimulation;Moist Heat;DME Instruction;Gait training;Stair training;Functional mobility training;Therapeutic activities;Therapeutic exercise;Balance training;Neuromuscular re-education;Patient/family education;Orthotic Fit/Training;Manual techniques;Passive range of motion    PT Next Visit Plan  Take blood pressure as needed. Focus on gait training and dynamic balance with a focus on increasing patient's endurance.    PT  Home Exercise Plan  01/01/20:  bridges, SLR, seated toeraises and LAQ; 01/10/20: Tandem stance balance each LE forward daily; 02/14/20: STS 5x 2-4  reps daily    Consulted and Agree with Plan of Care  Patient       Patient will benefit from skilled therapeutic intervention in order to improve the following deficits and impairments:  Abnormal gait, Decreased knowledge of use of DME, Improper body mechanics, Decreased coordination, Decreased mobility, Postural dysfunction, Decreased activity tolerance, Decreased endurance, Decreased strength, Decreased balance, Difficulty walking  Visit Diagnosis: Other symptoms and signs involving the nervous system  Other abnormalities of gait and mobility  Muscle weakness (generalized)     Problem List Patient Active Problem List   Diagnosis Date Noted  . 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[address]) 01/13/2020  . Acute right-sided weakness 01/13/2020  . Difficulty urinating 01/13/2020  . Stroke ([place])   . CVA (cerebral vascular accident) ([place]) 12/20/2019  . 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[place])   . PVC (premature ventricular contraction)   . Cervical stenosis of spinal canal   . Failure to thrive in adult 05/26/2017  . Failure to thrive (0-17) 05/25/2017  . Abdominal pulsatile mass 05/25/2017  . Anemia 05/25/2017  . Essential hypertension 05/25/2017  . Gait instability 05/20/2017  . Frequent falls 05/20/2017  . Depression 05/20/2017   Clarene Critchley PT, DPT 11:23 AM, 02/21/20 [place] [address] [place], [place], [address] Phone: [phone]   Fax:  [phone]  Name: Devin Valdez MRN: [id number] Date of Birth: December 29, 1940

## 2020-02-24 ENCOUNTER — Ambulatory Visit (HOSPITAL_COMMUNITY): Payer: Medicare Other | Admitting: Speech Pathology

## 2020-02-25 ENCOUNTER — Ambulatory Visit (HOSPITAL_COMMUNITY): Payer: Medicare Other | Admitting: Physical Therapy

## 2020-02-25 ENCOUNTER — Other Ambulatory Visit: Payer: Self-pay

## 2020-02-25 ENCOUNTER — Encounter (HOSPITAL_COMMUNITY): Payer: Self-pay | Admitting: Physical Therapy

## 2020-02-25 DIAGNOSIS — R2689 Other abnormalities of gait and mobility: Secondary | ICD-10-CM

## 2020-02-25 DIAGNOSIS — R29818 Other symptoms and signs involving the nervous system: Secondary | ICD-10-CM

## 2020-02-25 DIAGNOSIS — M6281 Muscle weakness (generalized): Secondary | ICD-10-CM

## 2020-02-25 NOTE — Therapy (Signed)
[place], [place], [address] Phone: [phone]   Fax:  [phone]  Physical Therapy Treatment  Patient Details  Name: Devin Valdez MRN: [id number] Date of Birth: January 16, 1941 Referring Provider (PT): [name]   Encounter Date: 02/25/2020  PT End of Session - 02/25/20 0927    Visit Number  14    Number of Visits  18    Date for PT Re-Evaluation  02/28/20    Authorization Type  Primary: Medicare; Secondary: UHC    Authorization Time Period  12/27/19 - 02/07/20; 02/07/20 - 02/28/20    Progress Note Due on Visit  20    PT Start Time  0905    PT Stop Time  0943    PT Time Calculation (min)  38 min    Equipment Utilized During Treatment  Gait belt    Activity Tolerance  Patient limited by fatigue;Patient tolerated treatment well    Behavior During Therapy  [facility] for tasks assessed/performed       Past Medical History:  Diagnosis Date  . Anemia   . Anxiety   . 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 Depression   . Falls   . Hallucinations   . Hyperlipidemia   . Hypertension   . Stroke [facility])    left paracentral pons infarct    Past Surgical History:  Procedure Laterality Date  . EYE SURGERY    . HERNIA REPAIR    . POSTERIOR CERVICAL LAMINECTOMY     with fusion C3-C7 10/19- Dr. Brigitte Pulse at [facility]    There were no vitals filed for this visit.  Subjective Assessment - 02/25/20 0907    Subjective  Patient reported pain in his hands which he said is his usual pain.    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  Yes    Pain Score  7                        [facility] Adult PT Treatment/Exercise  - 02/25/20 0001      Ambulation/Gait   Ambulation/Gait  Yes    Ambulation Distance (Feet)  400 Feet    Assistive device  Small based quad cane    Gait Pattern  Poor foot clearance - right;Decreased stance time - right    Gait Comments  Outside ambulation with negotiation of curb x 1 3 brief standing rest breaks 400' total      Knee/Hip Exercises: Standing   Other Standing Knee Exercises  --   Cone rotation 180 degrees 2x5 each side   Other Standing Knee Exercises  Retro walking 15' x 2. Tandem stance on foam each LE forward 20-30 seconds 1x each. Tandem ambulation 15' x 2      Knee/Hip Exercises: Seated   Sit to Sand  without UE support;2 sets   9 reps              PT Short Term Goals - 02/07/20 1122      PT SHORT TERM GOAL #1   Title  Patient will report understanding and regular compliance with HEP  to improve strength, balance, and overall functional mobility.    Time  3    Period  Weeks    Status  Achieved    Target Date  01/17/20        PT Long Term Goals - 02/07/20 1122      PT LONG TERM GOAL #1   Title  Patient will demonstrate ability to perform sit to stand without upper extremity assistance indicating improved functional strength and balance.    Time  6    Period  Weeks    Status  Achieved      PT LONG TERM GOAL #2   Title  Patient will demonstrate improvement of at least 3 points on the DGI indicating improved balance for improved safety with functional mobility.    Baseline  02/07/20: See objective measures    Time  6    Period  Weeks    Status  Achieved      PT LONG TERM GOAL #3   Title  Patient will report improvement in endurance and functional mobility of at least 50%.    Baseline  02/07/20: Patient reported at least 50% improvement, but less than 75% improvement.    Time  6    Period  Weeks    Status  Achieved      PT LONG TERM GOAL #4   Title  Patient will ascend and descend at least 4 7'' steps with mod I in order to perform stair  negotiation safely at home.    Baseline  02/07/20: Patient required CGA    Time  3    Period  Weeks    Status  On-going    Target Date  02/28/20      PT LONG TERM GOAL #5   Title  Patient will report improvement in endurance and functional mobility of at least 75%.    Time  3    Period  Weeks    Status  New    Target Date  02/28/20            Plan - 02/25/20 0955    Clinical Impression Statement  Patient's BP was taken and was found to be 122/68 with HR of 50 BPM at the start of the session. Patient ambulated an increased distance this session and was able to demonstrate safe ambulation outside with navigation of a curb x1. Patient also demonstrated an increase in the number of repetitions performed with consecutive sit to stands. Discussed with patient plan to re-assess patient at next session.    Personal Factors and Comorbidities  Age;Comorbidity 3+    Comorbidities  CVA, cervical stenosis, depression    Examination-Activity Limitations  Bathing;Stand;Locomotion Level;Bed Mobility;Transfers;Stairs    Examination-Participation Restrictions  Yard Work;Cleaning;Meal Prep;Community Activity    Stability/Clinical Decision Making  Stable/Uncomplicated    Rehab Potential  Good    PT Frequency  2x / week    PT Duration  3 weeks    PT Treatment/Interventions  ADLs/Self Care Home Management;Aquatic Therapy;Cryotherapy;Electrical Stimulation;Moist Heat;DME Instruction;Gait training;Stair training;Functional mobility training;Therapeutic activities;Therapeutic exercise;Balance training;Neuromuscular re-education;Patient/family education;Orthotic Fit/Training;Manual techniques;Passive range of motion    PT Next Visit Plan  Re-assess next session    PT Home Exercise Plan  01/01/20:  bridges, SLR, seated toeraises and LAQ; 01/10/20: Tandem stance balance each LE forward daily; 02/14/20: STS 5x 2-4 reps daily    Consulted and Agree with Plan of Care  Patient       Patient will benefit from  skilled therapeutic intervention  in order to improve the following deficits and impairments:  Abnormal gait, Decreased knowledge of use of DME, Improper body mechanics, Decreased coordination, Decreased mobility, Postural dysfunction, Decreased activity tolerance, Decreased endurance, Decreased strength, Decreased balance, Difficulty walking  Visit Diagnosis: Other symptoms and signs involving the nervous system  Other abnormalities of gait and mobility  Muscle weakness (generalized)     Problem List Patient Active Problem List   Diagnosis Date Noted  . 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[place]) 01/13/2020  . Acute right-sided weakness 01/13/2020  . Difficulty urinating 01/13/2020  . Stroke ([place])   . CVA (cerebral vascular accident) ([place]) 12/20/2019  . 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[place])   . PVC (premature ventricular contraction)   . Cervical stenosis of spinal canal   . Failure to thrive in adult 05/26/2017  . Failure to thrive (0-17) 05/25/2017  . Abdominal pulsatile mass 05/25/2017  . Anemia 05/25/2017  . Essential hypertension 05/25/2017  . Gait instability 05/20/2017  . Frequent falls 05/20/2017  . Depression 05/20/2017   Clarene Critchley PT, DPT 9:57 AM, 02/25/20 [place] [address] [place], [place], [address] Phone: [phone]   Fax:  [phone]  Name: DAERON CARRENO MRN: [id number] Date of Birth: 1941-09-19

## 2020-02-26 ENCOUNTER — Ambulatory Visit (HOSPITAL_COMMUNITY): Payer: Medicare Other | Admitting: Speech Pathology

## 2020-02-27 ENCOUNTER — Other Ambulatory Visit: Payer: Self-pay

## 2020-02-27 ENCOUNTER — Ambulatory Visit (HOSPITAL_COMMUNITY): Payer: Medicare Other | Admitting: Physical Therapy

## 2020-02-27 ENCOUNTER — Encounter (HOSPITAL_COMMUNITY): Payer: Self-pay | Admitting: Physical Therapy

## 2020-02-27 DIAGNOSIS — R2689 Other abnormalities of gait and mobility: Secondary | ICD-10-CM

## 2020-02-27 DIAGNOSIS — R29818 Other symptoms and signs involving the nervous system: Secondary | ICD-10-CM

## 2020-02-27 DIAGNOSIS — M6281 Muscle weakness (generalized): Secondary | ICD-10-CM | POA: Diagnosis not present

## 2020-02-27 NOTE — Therapy (Signed)
[place] [address] [place], [place], [address] Phone: [phone]   Fax:  [phone]  Physical Therapy Treatment / Progress / Discharge note  Patient Details  Name: Devin Valdez MRN: [id number] Date of Birth: [birth year] Referring Provider (PT): [name]   Encounter Date: 02/27/2020   Progress Note Reporting Period 02/07/20 to 02/27/20  See note below for Objective Data and Assessment of Progress/Goals.     PHYSICAL THERAPY DISCHARGE SUMMARY  Visits from Start of Care: 15  Current functional level related to goals / functional outcomes: See below   Remaining deficits: See below      Education / Equipment: See below    Plan: Patient agrees to discharge.  Patient goals were met. Patient is being discharged due to meeting the stated rehab goals.  ?????       PT End of Session - 02/27/20 0911    Visit Number  15    Number of Visits  18    Date for PT Re-Evaluation  02/28/20    Authorization Type  Primary: Medicare; Secondary: UHC    Authorization Time Period  12/27/19 - 02/07/20; 02/07/20 - 02/28/20    Progress Note Due on Visit  20    PT Start Time  0905    PT Stop Time  0930    PT Time Calculation (min)  25 min    Equipment Utilized During Treatment  Gait belt    Activity Tolerance  Patient limited by fatigue;Patient tolerated treatment well    Behavior During Therapy  [facility] for tasks assessed/performed       Past Medical History:  Diagnosis Date  . Anemia   . Anxiety   . 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 Depression   . Falls   . Hallucinations   . Hyperlipidemia   . Hypertension   . Stroke [facility])    left paracentral pons infarct    Past Surgical History:  Procedure Laterality Date  . EYE SURGERY    . HERNIA REPAIR    . POSTERIOR CERVICAL LAMINECTOMY     with fusion C3-C7 10/19- Dr. Brigitte Pulse at [facility]    There were no vitals filed for this visit.  Subjective  Assessment - 02/27/20 0907    Subjective  Patient reported that he is having his usual hand pain which he rates as a 7/10. Stated that he feels ready to be done with therapy. Patient reported improvement of at least 75% from starting therapy.    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  Yes    Pain Score  7     Pain Location  Hand    Pain Orientation  Right;Left    Pain Descriptors / Indicators  Aching    Pain Type  Chronic pain         [facility] PT Assessment - 02/27/20 0001      Assessment   Medical Diagnosis  CVA    Referring Provider (PT)  Cammie Mcgee Pickard    Onset Date/Surgical Date  12/20/19      Observation/Other Assessments   Focus on Therapeutic Outcomes (FOTO)  57%   was 47% then 54%     Dynamic Gait Index   Level Surface  Normal   was 3   Change in Gait Speed  Normal   was 3   Gait with Horizontal Head Turns  Mild Impairment   was 2   Gait with Vertical Head Turns  Mild Impairment   was 2   Gait and Pivot Turn  Normal   was 2   Step Over Obstacle  Normal   was 3   Step Around Obstacles  Normal   was 2   Steps  Mild Impairment   was 2   Total Score  21    DGI comment:  was 19                              PT Short Term Goals - 02/07/20 1122      PT SHORT TERM GOAL #1   Title  Patient will report understanding and regular compliance with HEP to improve strength, balance, and overall functional mobility.    Time  3    Period  Weeks    Status  Achieved    Target Date  01/17/20        PT Long Term Goals - 02/27/20 0909      PT LONG TERM GOAL #1   Title  Patient will demonstrate ability to perform sit to stand without upper extremity assistance  indicating improved functional strength and balance.    Time  6    Period  Weeks    Status  Achieved      PT LONG TERM GOAL #2   Title  Patient will demonstrate improvement of at least 3 points on the DGI indicating improved balance for improved safety with functional mobility.    Baseline  02/07/20: See objective measures    Time  6    Period  Weeks    Status  Achieved      PT LONG TERM GOAL #3   Title  Patient will report improvement in endurance and functional mobility of at least 50%.    Baseline  02/07/20: Patient reported at least 50% improvement, but less than 75% improvement.    Time  6    Period  Weeks    Status  Achieved      PT LONG TERM GOAL #4   Title  Patient will ascend and descend at least 4 7'' steps with mod I in order to perform stair negotiation safely at home.    Baseline  02/27/20: Patient required increased time, but ascended/descended stairs mod I    Time  3    Period  Weeks    Status  Achieved      PT LONG TERM GOAL #5   Title  Patient will report improvement in endurance and functional mobility of at least 75%.    Baseline  02/27/20: Patient reported improvement of at least 75% since beginning therapy.    Time  3    Period  Weeks    Status  Achieved            Plan - 02/27/20 6415    Clinical Impression Statement  Performed re-assessment of patient's progress towards goals. Patient achieved all short term and long term goals. He demonstrated improvement on FOTO of a total of 10% since initiating therapy and demonstrated an additional improvement on the DGI this session from 19 to 21 points.  Patient reported feeling good to be done with therapy at this time. Educated patient on safe performance of step-up exercise and how to progress exercises to improve patient's endurance at home. Provided patient with the clinic phone number as well to contact us with any questions or concerns in the future. Patient is being discharged at this time as he has met all of  his goals.    Personal Factors and Comorbidities  Age;Comorbidity 3+    Comorbidities  CVA, cervical stenosis, depression    Examination-Activity Limitations  Bathing;Stand;Locomotion Level;Bed Mobility;Transfers;Stairs    Examination-Participation Restrictions  Yard Work;Cleaning;Meal Prep;Community Activity    Stability/Clinical Decision Making  Stable/Uncomplicated    Rehab Potential  Good    PT Frequency  2x / week    PT Duration  3 weeks    PT Treatment/Interventions  ADLs/Self Care Home Management;Aquatic Therapy;Cryotherapy;Electrical Stimulation;Moist Heat;DME Instruction;Gait training;Stair training;Functional mobility training;Therapeutic activities;Therapeutic exercise;Balance training;Neuromuscular re-education;Patient/family education;Orthotic Fit/Training;Manual techniques;Passive range of motion    PT Next Visit Plan  Discharged    PT Home Exercise Plan  01/01/20:  bridges, SLR, seated toeraises and LAQ; 01/10/20: Tandem stance balance each LE forward daily; 02/14/20: STS 5x 2-4 reps daily; 02/27/20: forward step-ups    Consulted and Agree with Plan of Care  Patient       Patient will benefit from skilled therapeutic intervention in order to improve the following deficits and impairments:  Abnormal gait, Decreased knowledge of use of DME, Improper body mechanics, Decreased coordination, Decreased mobility, Postural dysfunction, Decreased activity tolerance, Decreased endurance, Decreased strength, Decreased balance, Difficulty walking  Visit Diagnosis: Other symptoms and signs involving the nervous system  Other abnormalities of gait and mobility  Muscle weakness (generalized)     Problem List Patient Active Problem List   Diagnosis Date Noted  . 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[place]) 01/13/2020  . Acute right-sided weakness 01/13/2020  . Difficulty urinating 01/13/2020  . Stroke ([place])   . CVA (cerebral vascular accident) ([place]) 12/20/2019  . 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[place])   . PVC  (premature ventricular contraction)   . Cervical stenosis of spinal canal   . Failure to thrive in adult 05/26/2017  . Failure to thrive (0-17) 05/25/2017  . Abdominal pulsatile mass 05/25/2017  . Anemia 05/25/2017  . Essential hypertension 05/25/2017  . Gait instability 05/20/2017  . Frequent falls 05/20/2017  . Depression 05/20/2017   Clarene Critchley PT, DPT 9:42 AM, 02/27/20 [place] [address] [place], [place], [address] Phone: [phone]   Fax:  [phone]  Name: Devin Valdez MRN: [id number] Date of Birth: Aug 26, 1941

## 2020-03-02 ENCOUNTER — Ambulatory Visit (HOSPITAL_COMMUNITY): Payer: Medicare Other | Admitting: Speech Pathology

## 2020-03-04 ENCOUNTER — Ambulatory Visit (HOSPITAL_COMMUNITY): Payer: Medicare Other | Admitting: Speech Pathology

## 2020-03-17 ENCOUNTER — Other Ambulatory Visit: Payer: Self-pay | Admitting: Family Medicine

## 2020-03-26 ENCOUNTER — Telehealth: Payer: Self-pay | Admitting: Family Medicine

## 2020-03-26 NOTE — Telephone Encounter (Signed)
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

## 2020-03-30 ENCOUNTER — Other Ambulatory Visit: Payer: Self-pay

## 2020-03-30 ENCOUNTER — Ambulatory Visit (INDEPENDENT_AMBULATORY_CARE_PROVIDER_SITE_OTHER): Payer: Medicare Other | Admitting: Family Medicine

## 2020-03-30 ENCOUNTER — Encounter: Payer: Self-pay | Admitting: Family Medicine

## 2020-03-30 VITALS — BP 154/76 | HR 80 | Temp 97.2°F | Resp 12 | Ht 67.0 in | Wt 136.0 lb

## 2020-03-30 DIAGNOSIS — R2681 Unsteadiness on feet: Secondary | ICD-10-CM | POA: Diagnosis not present

## 2020-03-30 DIAGNOSIS — R531 Weakness: Secondary | ICD-10-CM

## 2020-03-30 DIAGNOSIS — Z8673 Personal history of transient ischemic attack (TIA), and cerebral infarction without residual deficits: Secondary | ICD-10-CM

## 2020-03-30 DIAGNOSIS — I10 Essential (primary) hypertension: Secondary | ICD-10-CM | POA: Diagnosis not present

## 2020-03-30 DIAGNOSIS — I6930 Unspecified sequelae of cerebral infarction: Secondary | ICD-10-CM

## 2020-03-30 LAB — COMPLETE METABOLIC PANEL WITH GFR
AG Ratio: 2.1 (calc) (ref 1.0–2.5)
ALT: 18 U/L (ref 9–46)
AST: 15 U/L (ref 10–35)
Albumin: 4.5 g/dL (ref 3.6–5.1)
Alkaline phosphatase (APISO): 64 U/L (ref 35–144)
BUN: 21 mg/dL (ref 7–25)
CO2: 26 mmol/L (ref 20–32)
Calcium: 9.8 mg/dL (ref 8.6–10.3)
Chloride: 104 mmol/L (ref 98–110)
Creat: 1.14 mg/dL (ref 0.70–1.18)
GFR, Est African American: 71 mL/min/{1.73_m2} (ref >=60)
GFR, Est Non African American: 61 mL/min/{1.73_m2} (ref >=60)
Globulin: 2.1 g/dL (calc) (ref 1.9–3.7)
Glucose, Bld: 106 mg/dL — ABNORMAL HIGH (ref 65–99)
Potassium: 4.1 mmol/L (ref 3.5–5.3)
Sodium: 139 mmol/L (ref 135–146)
Total Bilirubin: 0.6 mg/dL (ref 0.2–1.2)
Total Protein: 6.6 g/dL (ref 6.1–8.1)

## 2020-03-30 LAB — CBC WITH DIFFERENTIAL/PLATELET
Absolute Monocytes: 663 cells/uL (ref 200–950)
Basophils Absolute: 19 cells/uL (ref 0–200)
Basophils Relative: 0.3 %
Eosinophils Absolute: 37 cells/uL (ref 15–500)
Eosinophils Relative: 0.6 %
HCT: 34.9 % — ABNORMAL LOW (ref 38.5–50.0)
Hemoglobin: 11.3 g/dL — ABNORMAL LOW (ref 13.2–17.1)
Lymphs Abs: 632 cells/uL — ABNORMAL LOW (ref 850–3900)
MCH: 29.1 pg (ref 27.0–33.0)
MCHC: 32.4 g/dL (ref 32.0–36.0)
MCV: 89.9 fL (ref 80.0–100.0)
MPV: 10.3 fL (ref 7.5–12.5)
Monocytes Relative: 10.7 %
Neutro Abs: 4848 cells/uL (ref 1500–7800)
Neutrophils Relative %: 78.2 %
Platelets: 247 10*3/uL (ref 140–400)
RBC: 3.88 10*6/uL — ABNORMAL LOW (ref 4.20–5.80)
RDW: 11.8 % (ref 11.0–15.0)
Total Lymphocyte: 10.2 %
WBC: 6.2 10*3/uL (ref 3.8–10.8)

## 2020-03-30 LAB — MAGNESIUM: Magnesium: 2.1 mg/dL (ref 1.5–2.5)

## 2020-03-30 NOTE — Progress Notes (Signed)
Subjective:    Patient ID: Devin Valdez, male    DOB: Apr 06, 1941, 79 y.o.   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  I have copied relevant portions of his discharge summary below and included them for my reference:   Admit date: 01/13/2020 Discharge date: 01/14/2020   Recommendations for Outpatient Follow-up:  1. Follow-up with Dr. Leonie Man, as previously scheduled on 01/16/2020. 2. Follow-up with Devin Frizzle, MD on 01/20/2020.  Patient's blood pressure will need to be reassessed on follow-up and per neurology goal was to keep systolic blood pressure greater than 140.  Patient will need a basic metabolic profile done on follow-up to follow-up on electrolytes and renal function.  Patient has difficulty urinating will need to be followed up upon.   Discharge Diagnoses:  Principal Problem:   Extension of stroke [facility]) Active Problems:   Gait instability   Acute right-sided weakness   Difficulty urinating  History of present illness:  HPI per Dr. Evangeline Gula Antionne P Valdez a 78 y.o.malewith medical history significant ofhypertension, anemia of chronic renal failure, chronic kidney disease stage IV, and hypertension who had a stroke in January 2021 who presents to the emergency department complaining of bilateral lower extremity weakness. The extremity weakness has been worsening over the past 48 hours. He was recently in the hospital and had the same deficit. He feels like his stroke has reoccurred. After discharge he went home with outpatient physical therapy and has completed 3 weeks of therapy. He was started on aspirin and Plavix for 3 weeks. He completed his 3 weeks of Plavix. He has follow-up with the neurologist tomorrow and then was sitting further rehabilitation. Has had no fever, nausea, cough, congestion, vomiting, or diarrhea. Is been drinking a little less over the past 24 hours but cannot quantify. He  does not have any abdominal pain or blood in his stools. He takes iron and has chronically dark stools. Severity of his symptoms were moderate onset was gradual. It is been present for 2 days and constant. He feels that it has been worsening. There is a recurrence he of the symptoms. Nothing has made it better nothing has made it worse. He has no associated symptoms as noted above. Neurology was consulted by the emergency department who recommended getting an MRI of the brain. 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 Patient complains of inability to make urine which was not a problem before. Patient thinks he has not been drinking much water and may be dehydrated. Stroke extensions usually occur due to hypotension but patient does not recall any episodes of feeling dizzy or hypotensive or passing out. Patient was discharged on 12/21/2019 on dual antiplatelet therapy. He received outpatient physical therapy at [facility]. Neurology diagnosed the patient with an extension of existing recent pontine stroke and recommended restarting dual antiplatelet therapy and observation.  Hospital Course:  1 extension of CVA Patient noted to have an extension of his stroke as he had presented with worsening lower extremity weakness MRI of the head was consistent with extension of a pontine stroke.  Patient denied any hypotensive spells, nausea or vomiting.  Patient was seen by PT /OT and ST and recommendations were for outpatient therapy.  Patient noted to have an LDL of 117 on January 2021 during last hospitalization.  Neurology was consulted and patient seen in consultation by Dr. [name] who recommended continuation of dual antiplatelet therapy of aspirin and Plavix, for 3  weeks, and then Plavix daily.  It was felt no need to repeat stroke work-up and to keep systolic blood pressure greater than 140s.  Patient remained in stable condition.  Patient will be discharged in stable improved condition and is  to follow-up with Dr. Leonie Man, neurology as previously scheduled on 01/16/2020.  2.  Hypertension Remained stable.  Patient maintained on home regimen of Cozaar.  3.  Gait instability Secondary to problem #1.  Patient was seen by PT/OT.  Outpatient PT recommended.  4.  Difficulty urinating On admission patient did have some complaints of difficulty urinating however did state had improved by day of discharge.  Procedures:  MRI 01/13/2020  Consultations:  Neurology: Dr.Arora 01/13/2020 01/20/20 Patient is here today for follow-up with his wife.  He states that prior to going to the hospital he was not drinking very much.  He was having a difficult time urinating.  He believes he was dehydrated.  This caused his blood pressure dropped and he believes this is what led to extension of his CVA.  Since discharge from the hospital, the patient states that he is doing better.  He still feels extremely weak and tired.  He occasionally slurs his speech.  He denies choking after eating or drinking.  He denies any falls.  He does have some slight deficiency in his balance with some mild right-sided weakness in his arms and his legs but otherwise is improving.  He is working with physical therapy.  His blood pressure is in the 993-716 range systolic.  His wife denies any hypotensive episodes since discharge from the hospital.  He is eating and drinking well.  At that time, my plan was: I have recommended that we try to maintain his systolic blood pressure around 140 for the next month.  I want to avoid extension of his stroke in the vulnerable peri-infarct area.  Therefore I want to avoid hypotension.  I have asked him to check his blood pressure daily.  If his blood pressure is low, I encouraged him to drink Gatorade to maintain his hydration and if his blood pressure is extremely low, we may need to temporarily discontinue his losartan.  Check CBC, CMP today to monitor his renal function.  In 1 month, we  would start to try to ensure that we keep his systolic blood pressure less than 967 and his diastolic blood pressure less than 90 however for the first month will allow permissive hypertension.  Continue to work with physical therapy.  02/14/20 Patient presents with bilateral hand pain left greater than right.  The pain is distal to the wrist both hands.  He reports pins-and-needles and burning sensations in both hands.  This primarily involves the thumb, index finger, and the third finger.  He also reports weakness in his hands.  There is no erythema or swelling in his MCP, PIP, or DIP joints.  He does have a positive Phalen sign.  He has a negative Tinel's sign.  He has normal grip strength.  There is no thenar eminence wasting.  He has no crepitus.  There is no decreased range of motion in his hands.  At that time, my plan was: I believe the pain in his hands is due to carpal tunnel syndrome bilaterally.  He is unable to tolerate NSAIDs due to his antiplatelet agent.  Patient agreed to a cortisone injection in his left wrist.  Using sterile technique, I injected 1 cc of lidocaine and 1 cc of 40 mg/mL Kenalog into  the left carpal tunnel through the distal flexor crease at the wrist after aspirating to ensure that we are not injecting into a blood vessel.  Patient tolerated procedure well without complication.  If the pain improves by next week he could return for repeat injection in his right wrist.  03/30/20 Patient's chief complaint today reports spasms in his hands and legs.  However upon further questioning, he denies any uncontrolled muscle contractions or cramps in his hands arms or legs.  Instead he reports trembling and weakness in his arms and legs when he tries to stand up.  He feels unsteady and off-balance.  His legs will shake as he stands.  Occasionally as he is losing his balance his arms will shake and tremble bilaterally.  Patient has 5/5 equal and symmetric muscle strength in his upper and  lower extremities.  There is no hemiparesis or weakness.  He walks using a 4 pronged walker with no evidence of ataxia.  He is not dragging his feet or shuffling his gait.  He does have issues standing from a seated position and wobbles forward and backward as he stands up.  He denies any myoclonic jerking or tremor like activity or seizure-like activity.  Blood pressure today is confirmed to be elevated.  Patient states that blood pressure at home is also elevated.  Rather than spasms, I believe the patient is discussing an unsteadiness and weakness that he is feeling with ambulation due to deconditioning and effects of his stroke.  I do not believe he is discussing true muscle spasms or contractures or seizure-like activity.  Past Medical History:  Diagnosis Date  . Anemia   . Anxiety   . 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 Depression   . Falls   . Hallucinations   . Hyperlipidemia   . Hypertension   . Stroke [facility])    left paracentral pons infarct   Past Surgical History:  Procedure Laterality Date  . EYE SURGERY    . HERNIA REPAIR    . POSTERIOR CERVICAL LAMINECTOMY     with fusion C3-C7 10/19- Dr. Brigitte Pulse at [facility]   Current Outpatient Medications on File Prior to Visit  Medication Sig Dispense Refill  . acetaminophen (TYLENOL) 325 MG tablet Take 650 mg by mouth every 6 (six) hours as needed for mild pain or headache.    Marland Kitchen atorvastatin (LIPITOR) 40 MG tablet TAKE ONE TABLET (40MG  TOTAL) BY MOUTH DAILY AT 6PM 90 tablet 1  . clopidogrel (PLAVIX) 75 MG tablet Take 1 tablet (75 mg total) by mouth daily. 30 tablet 2  . Coenzyme Q10 (CO Q-10) 200 MG CAPS Take by mouth daily.    . ferrous sulfate 325 (65 FE) MG tablet Take 1 tablet (325 mg total) by mouth 2 (two) times daily with a meal. (Patient taking differently: Take 325 mg by mouth daily with breakfast. ) 60 tablet 1  . losartan (COZAAR) 50 MG tablet TAKE ONE (1) TABLET BY MOUTH EVERY DAY FOR BLOOD PRESSURE  (Patient taking differently: Take 50 mg by mouth daily. ) 90 tablet 3  . Multiple Vitamins-Minerals (CENTRUM SILVER ADULT 50+) TABS Take 1 capsule by mouth 2 (two) times daily. 30 tablet 2  . sildenafil (VIAGRA) 100 MG tablet TAKE ONE TABLET (100MG  TOTAL) BY MOUTH DAILY AS NEEDED FOR ERECTILE DYSFUNCTION (Patient taking differently: Take 100 mg by mouth as needed for erectile dysfunction. ) 10 tablet 5   No current facility-administered medications on file  prior to visit.   No Known Allergies Social History   Socioeconomic History  . Marital status: Married    Spouse name: Not on file  . Number of children: Not on file  . Years of education: Not on file  . Highest education level: Not on file  Occupational History  . Not on file  Tobacco Use  . Smoking status: Former Smoker    Quit date: 11/21/1985    Years since quitting: 34.3  . Smokeless tobacco: Never Used  Substance and Sexual Activity  . Alcohol use: No  . Drug use: No  . Sexual activity: Yes  Other Topics Concern  . Not on file  Social History Narrative  . Not on file   Social Determinants of Health   Financial Resource Strain:   . Difficulty of Paying Living Expenses:   Food Insecurity:   . Worried About Charity fundraiser in the Last Year:   . Arboriculturist in the Last Year:   Transportation Needs:   . Film/video editor (Medical):   Marland Kitchen Lack of Transportation (Non-Medical):   Physical Activity:   . Days of Exercise per Week:   . Minutes of Exercise per Session:   Stress:   . Feeling of Stress :   Social Connections:   . Frequency of Communication with Friends and Family:   . Frequency of Social Gatherings with Friends and Family:   . Attends Religious Services:   . Active Member of Clubs or Organizations:   . Attends Archivist Meetings:   Marland Kitchen Marital Status:   Intimate Partner Violence:   . Fear of Current or Ex-Partner:   . Emotionally Abused:   Marland Kitchen Physically Abused:   . Sexually Abused:      Review of Systems  All other systems reviewed and are negative.      Objective:   Physical Exam Constitutional:      Appearance: Normal appearance. He is normal weight.  Cardiovascular:     Rate and Rhythm: Normal rate and regular rhythm. Frequent extrasystoles are present.    Heart sounds: Normal heart sounds. No murmur. No friction rub. No gallop.   Pulmonary:     Effort: Pulmonary effort is normal. No respiratory distress.     Breath sounds: Normal breath sounds. No stridor. No wheezing, rhonchi or rales.  Chest:     Chest wall: No tenderness.  Musculoskeletal:     Right lower leg: No edema.     Left lower leg: No edema.  Neurological:     Mental Status: He is alert and oriented to person, place, and time. Mental status is at baseline.     Cranial Nerves: Cranial nerves are intact.     Sensory: Sensation is intact.     Motor: Motor function is intact. No weakness, atrophy, abnormal muscle tone, seizure activity or pronator drift.     Coordination: Coordination is intact. Coordination normal. Finger-Nose-Finger Test and Heel to [facility] Test normal. Rapid alternating movements normal.     Deep Tendon Reflexes: Reflexes are normal and symmetric.     Reflex Scores:      Bicep reflexes are 2+ on the right side and 2+ on the left side.      Brachioradialis reflexes are 2+ on the right side and 2+ on the left side.      Achilles reflexes are 2+ on the right side and 2+ on the left side.  Assessment & Plan:  Benign essential HTN - Plan: CBC with Differential/Platelet, COMPLETE METABOLIC PANEL WITH GFR, Magnesium  Weakness - Plan: Ambulatory referral to Physical Therapy  History of CVA (cerebrovascular accident) - Plan: Ambulatory referral to Physical Therapy  Unsteady gait when walking - Plan: Ambulatory referral to Physical Therapy  Patient's blood pressure is elevated.  Increase losartan to 50 mg twice daily and recheck blood pressure in 2 to 3 weeks.  Patient  is having frequent PVCs on exam and therefore I will check his electrolytes including a CBC, CMP, and magnesium level.  Patient is not describing true spasms or cramps or contractures for seizure-like activity.  Instead it is more of an unsteadiness and weakness in his extremities and with walking.  Therefore I would recommend physical therapy to try to address his.  More than 30 minutes was spent with the patient in history and physical exam to determine source of his problem.

## 2020-03-30 NOTE — Telephone Encounter (Signed)
Has apt today 

## 2020-04-08 ENCOUNTER — Encounter (INDEPENDENT_AMBULATORY_CARE_PROVIDER_SITE_OTHER): Payer: Self-pay | Admitting: Ophthalmology

## 2020-04-08 ENCOUNTER — Ambulatory Visit (INDEPENDENT_AMBULATORY_CARE_PROVIDER_SITE_OTHER): Payer: Medicare Other | Admitting: Ophthalmology

## 2020-04-08 ENCOUNTER — Encounter (INDEPENDENT_AMBULATORY_CARE_PROVIDER_SITE_OTHER): Payer: Medicare Other | Admitting: Ophthalmology

## 2020-04-08 ENCOUNTER — Other Ambulatory Visit: Payer: Self-pay

## 2020-04-08 DIAGNOSIS — H34812 Central retinal vein occlusion, left eye, with macular edema: Secondary | ICD-10-CM | POA: Diagnosis not present

## 2020-04-08 DIAGNOSIS — H35352 Cystoid macular degeneration, left eye: Secondary | ICD-10-CM

## 2020-04-08 DIAGNOSIS — H43822 Vitreomacular adhesion, left eye: Secondary | ICD-10-CM | POA: Diagnosis not present

## 2020-04-08 DIAGNOSIS — I6389 Other cerebral infarction: Secondary | ICD-10-CM | POA: Diagnosis not present

## 2020-04-08 MED ORDER — BEVACIZUMAB CHEMO INJECTION 1.25MG/0.05ML SYRINGE FOR KALEIDOSCOPE
1.2500 mg | INTRAVITREAL | Status: AC | PRN
  Administered 2020-04-08: 1.25 mg via INTRAVITREAL

## 2020-04-08 NOTE — Progress Notes (Signed)
04/08/2020     CHIEF COMPLAINT Patient presents for Blurred Vision   HISTORY OF PRESENT ILLNESS: Devin Valdez is a 79 y.o. male who presents to the clinic today for:   HPI    Blurred Vision    In left eye.  Onset was gradual.  Vision is blurred.  Severity is moderate.  Occurring constantly.  It is worse throughout the day.  Since onset it is stable.  Treatments tried include no treatments.  Response to treatment was no improvement.  I, the attending physician,  performed the HPI with the patient and updated documentation appropriately.          Comments    Pt referred by Dr. Wyatt Portela for Hem Retinal Vein Occlusion OS. Pt was seen yesterday by Dr. Katy Fitch. Pt c/o OS becoming gradually blurry. Pt states he sees floaters but denies FOL.       Last edited by Tilda Franco on 04/08/2020  9:39 AM. (History)      Referring physician: [name], MD [address] [facility] [address] [place],  [place] [address]  HISTORICAL INFORMATION:   Selected notes from the MEDICAL RECORD NUMBER    Lab Results  Component Value Date   HGBA1C 4.6 (L) 01/14/2020     CURRENT MEDICATIONS: No current outpatient medications on file. (Ophthalmic Drugs)   No current facility-administered medications for this visit. (Ophthalmic Drugs)   Current Outpatient Medications (Other)  Medication Sig  . acetaminophen (TYLENOL) 325 MG tablet Take 650 mg by mouth every 6 (six) hours as needed for mild pain or headache.  Marland Kitchen atorvastatin (LIPITOR) 40 MG tablet TAKE ONE TABLET (40MG  TOTAL) BY MOUTH DAILY AT 6PM  . clopidogrel (PLAVIX) 75 MG tablet Take 1 tablet (75 mg total) by mouth daily. (Patient not taking: Reported on 04/08/2020)  . Coenzyme Q10 (CO Q-10) 200 MG CAPS Take by mouth daily.  . ferrous sulfate 325 (65 FE) MG tablet Take 1 tablet (325 mg total) by mouth 2 (two) times daily with a meal. (Patient taking differently: Take 325 mg by mouth daily with breakfast. )  . losartan (COZAAR) 50 MG tablet  TAKE ONE (1) TABLET BY MOUTH EVERY DAY FOR BLOOD PRESSURE (Patient taking differently: Take 50 mg by mouth daily. )  . Multiple Vitamins-Minerals (CENTRUM SILVER ADULT 50+) TABS Take 1 capsule by mouth 2 (two) times daily.  . sildenafil (VIAGRA) 100 MG tablet TAKE ONE TABLET (100MG  TOTAL) BY MOUTH DAILY AS NEEDED FOR ERECTILE DYSFUNCTION (Patient taking differently: Take 100 mg by mouth as needed for erectile dysfunction. )   No current facility-administered medications for this visit. (Other)      REVIEW OF SYSTEMS:    ALLERGIES No Known Allergies  PAST MEDICAL HISTORY Past Medical History:  Diagnosis Date  . Anemia   . Anxiety   . 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 Depression   . Falls   . Hallucinations   . Hyperlipidemia   . Hypertension   . Stroke [facility])    left paracentral pons infarct   Past Surgical History:  Procedure Laterality Date  . EYE SURGERY    . HERNIA REPAIR    . POSTERIOR CERVICAL LAMINECTOMY     with fusion C3-C7 10/19- Dr. Brigitte Pulse at [facility] Family History  Problem Relation Age of Onset  . Multiple sclerosis Son   . Bipolar disorder Son     SOCIAL HISTORY Social  History   Tobacco Use  . Smoking status: Former Smoker    Quit date: 11/21/1985    Years since quitting: 34.4  . Smokeless tobacco: Never Used  Substance Use Topics  . Alcohol use: No  . Drug use: No         OPHTHALMIC EXAM:  Base Eye Exam    Visual Acuity (Snellen - Linear)      Right Left   Dist cc 20/30 -1 20/200   Dist ph cc  NI       Tonometry (Tonopen, 9:48 AM)      Right Left   Pressure 10 11       Pupils      Pupils Dark Light Shape React APD   Right PERRL 3 3 Round Minimal None   Left PERRL 2.5 2 Round Brisk None       Visual Fields      Left Right   Restrictions Partial outer inferior temporal deficiency        Neuro/Psych    Oriented x3: Yes   Mood/Affect: Normal       Dilation    Both eyes: 1.0%  Mydriacyl, 2.5% Phenylephrine @ 9:48 AM        Slit Lamp and Fundus Exam    Slit Lamp Exam      Right Left   Lids/Lashes Normal Normal   Conjunctiva/Sclera White and quiet White and quiet   Cornea Clear Clear   Anterior Chamber Deep and quiet Deep and quiet   Iris Round and reactive Round and reactive   Lens Posterior chamber intraocular lens Posterior chamber intraocular lens   Anterior Vitreous Normal Normal       Fundus Exam      Right Left   C/D Ratio 0.3 0.25   Macula  Macular thickening, Microaneurysms, Cystoid macular edema,  Intraretinal hemorrhage   Vessels  Hemiretinal vein occlusion superiorly.          IMAGING AND PROCEDURES  Imaging and Procedures for 04/08/20  OCT, Retina - OU - Both Eyes       Right Eye Scan locations included subfoveal. Central Foveal Thickness: 280. Progression has no prior data. Findings include normal observations.   Left Eye Quality was good. Scan locations included subfoveal. Central Foveal Thickness: 343. Progression has no prior data. Findings include abnormal foveal contour, cystoid macular edema, vitreomacular adhesion , subretinal hyper-reflective material.   Notes OS subretinal drusenoid type deposit in the fovea, possible early fibrosis       Intravitreal Injection, Pharmacologic Agent - OS - Left Eye       Time Out 04/08/2020. 11:03 AM. Confirmed correct patient, procedure, site, and patient consented.   Anesthesia Topical anesthesia was used. Anesthetic medications included Akten 3.5%.   Procedure Preparation included Tobramycin 0.3%, Ofloxacin .   Injection:  1.25 mg Bevacizumab (AVASTIN) SOLN   NDC: [address]-6, Lot: [id number]   Route: Intravitreal, Site: Left Eye, Waste: 0 mg  Post-op Post injection exam found visual acuity of at least counting fingers. The patient tolerated the procedure well. There were no complications. The patient received written and verbal post procedure care education. Post injection  medications were not given.                 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 The potential sequelae of ischemic  central retinal vein occlusion, including macular edema, neovascularization, rubeosis iridis, and neovascular glaucoma, were discussed, and the need for frequent follow-up.  The nature of macular edema and central retinal vein occlusion was discussed. The following options were considered:  1.Observation for a period to look for spontaneous improvement, is no linger the primary therapy. One-third worsen, one-third stay unchanged, and one-third improves.  2. Anti-VEGF Therapy. ( Lucentis, Avastin or Eylea ) injected  in intravitreal fashion, initially monthly then tailored to clinical response.  3. Intravitreal steroid usage, Kenalog, or Ozurdex, usually a second line therapy or in combination with anti-Vegf therapy noted above.  4. Panretinal laser photocoagulation to cause regression of iris neovascularization, or treat retinal  non-perfusion.  5. Surgical Management may include vitrectomy with incisions of peripheral veins to trigger retino choroidal anastomosis formation. This topic presented and discussed at [place].  OS will need to commence with intravitreal antiveg F promptly to stabilize and potentially improve the visual functioning left eye.  However there are subfoveal deposits which might limit ultimate visual acuity recovery       ICD-10-CM   1. 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 Vitreomacular traction, left  H43.822   3. Cystoid macular edema of left eye  H35.352     1.OS will need to commence with  intravitreal antiveg F promptly to stabilize and potentially improve the visual functioning left eye.  However there are subfoveal deposits which might limit ultimate visual acuity recovery    2.  Risks and benefits were reviewed at length  3.  Ophthalmic Meds Ordered this visit:  Meds ordered this encounter  Medications  . Bevacizumab (AVASTIN) SOLN 1.25 mg       Return in about 5 weeks (around 05/13/2020) for AVASTIN OCT, OS.  There are no Patient Instructions on file for this visit.   Explained the diagnoses, plan, and follow up with the patient and they expressed understanding.  Patient expressed understanding of the importance of proper follow up care.   Clent Demark Carsin Randazzo M.D. Diseases & Surgery of the Retina and Vitreous [facility] [place] 04/08/20     Abbreviations: M myopia (nearsighted); A astigmatism; H hyperopia (farsighted); P presbyopia; Mrx spectacle prescription;  CTL contact lenses; OD right eye; OS left eye; OU both eyes  XT exotropia; ET esotropia; PEK punctate epithelial keratitis; PEE punctate epithelial erosions; DES dry eye syndrome; MGD meibomian gland dysfunction; ATs artificial tears; PFAT's preservative free artificial tears; [place] nuclear sclerotic cataract; PSC posterior subcapsular cataract; ERM epi-retinal membrane; PVD posterior vitreous detachment; RD retinal detachment; DM diabetes mellitus; DR diabetic retinopathy; NPDR non-proliferative diabetic retinopathy; PDR proliferative diabetic retinopathy; CSME clinically significant macular edema; DME diabetic macular edema; dbh dot blot hemorrhages; CWS cotton wool spot; POAG primary open angle glaucoma; C/D cup-to-disc ratio; HVF humphrey visual field; GVF goldmann visual field; OCT optical coherence tomography; IOP intraocular pressure; BRVO Branch retinal vein occlusion; CRVO central retinal vein occlusion; CRAO central retinal artery occlusion; BRAO branch retinal artery occlusion; RT retinal tear;  SB scleral buckle; PPV pars plana vitrectomy; VH Vitreous hemorrhage; PRP panretinal laser photocoagulation; IVK intravitreal kenalog; VMT vitreomacular traction; MH Macular hole;  NVD neovascularization of the disc; NVE neovascularization elsewhere; AREDS age related eye disease study; ARMD age related macular degeneration; POAG primary open angle  glaucoma; EBMD epithelial/anterior basement membrane dystrophy; ACIOL anterior chamber intraocular lens; IOL intraocular lens; PCIOL posterior chamber intraocular lens; Phaco/IOL phacoemulsification with intraocular lens placement; [place] photorefractive keratectomy; LASIK laser assisted in situ keratomileusis; HTN hypertension; DM diabetes mellitus; COPD chronic obstructive pulmonary disease

## 2020-04-08 NOTE — Assessment & Plan Note (Signed)
The nature of central retinal vein occlusion was discussed with the patient including the division of types into nonischemic ischemic. The potential sequelae of ischemic central retinal vein occlusion, including macular edema, neovascularization, rubeosis iridis, and neovascular glaucoma, were discussed, and the need for frequent follow-up.  The nature of macular edema and central retinal vein occlusion was discussed. The following options were considered:  1.Observation for a period to look for spontaneous improvement, is no linger the primary therapy. One-third worsen, one-third stay unchanged, and one-third improves.  2. Anti-VEGF Therapy. ( Lucentis, Avastin or Eylea ) injected  in intravitreal fashion, initially monthly then tailored to clinical response.  3. Intravitreal steroid usage, Kenalog, or Ozurdex, usually a second line therapy or in combination with anti-Vegf therapy noted above.  4. Panretinal laser photocoagulation to cause regression of iris neovascularization, or treat retinal  non-perfusion.  5. Surgical Management may include vitrectomy with incisions of peripheral veins to trigger retino choroidal anastomosis formation. This topic presented and discussed at [place].  OS will need to commence with intravitreal antiveg F promptly to stabilize and potentially improve the visual functioning left eye.  However there are subfoveal deposits which might limit ultimate visual acuity recovery

## 2020-04-13 ENCOUNTER — Encounter (HOSPITAL_COMMUNITY): Payer: Self-pay | Admitting: Physical Therapy

## 2020-04-13 ENCOUNTER — Other Ambulatory Visit: Payer: Self-pay

## 2020-04-13 ENCOUNTER — Ambulatory Visit (HOSPITAL_COMMUNITY): Payer: Medicare Other | Attending: Family Medicine | Admitting: Physical Therapy

## 2020-04-13 DIAGNOSIS — M6281 Muscle weakness (generalized): Secondary | ICD-10-CM

## 2020-04-13 DIAGNOSIS — R2689 Other abnormalities of gait and mobility: Secondary | ICD-10-CM | POA: Diagnosis not present

## 2020-04-13 NOTE — Therapy (Signed)
[place], [place], [address] Phone: [phone]   Fax:  [phone]  Physical Therapy Evaluation  Patient Details  Name: Devin Valdez MRN: [id number] Date of Birth: [birth year] Referring Provider (PT): Jenna Luo MD   Encounter Date: 04/13/2020  PT End of Session - 04/13/20 1626    Visit Number  1    Number of Visits  16    Date for PT Re-Evaluation  06/12/20    Authorization Type  Medicare Part A; [place] secondary    Authorization - Visit Number  1    Authorization - Number of Visits  10    Progress Note Due on Visit  10    PT Start Time  1515    PT Stop Time  1600    PT Time Calculation (min)  45 min    Equipment Utilized During Treatment  Gait belt    Activity Tolerance  Patient limited by fatigue    Behavior During Therapy  [facility] for tasks assessed/performed       Past Medical History:  Diagnosis Date  . Anemia   . Anxiety   . 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 Depression   . Falls   . Hallucinations   . Hyperlipidemia   . Hypertension   . Stroke [facility])    left paracentral pons infarct    Past Surgical History:  Procedure Laterality Date  . EYE SURGERY    . HERNIA REPAIR    . POSTERIOR CERVICAL LAMINECTOMY     with fusion C3-C7 10/19- Dr. Brigitte Pulse at [facility]    There were no vitals filed for this visit.   Subjective Assessment - 04/13/20 1528    Subjective  Patient presents to physical therapy with complaint of weakness and balance difficulty. Patient has previously been seen in this clinic for this issue, but he states he feels he has lost strength and is not doing as well since last being seen in therapy in April. Patient presents today with his wife who describes patient history of having several strokes recently, one in January and one in February which precipitated his last round of physical therapy for strengthening. patient denies associated  pain. Denies any recent falls. He says he has had some episodes of falling to his knees due to weakness, but no trips or falls otherwise.    Patient is accompained by:  Family member   wife   Pertinent History  S/P Left [place] Artery CVA; Extension of stroke 01/13/20    Limitations  Walking;House hold activities;Standing;Lifting    How long can you stand comfortably?  3-4 min    How long can you walk comfortably?  3-5 min    Diagnostic tests  MRI 12/20/19: Acute brainstem infarct; MRI 01/13/20: extension of stroke    Patient Stated Goals  Get back to where I was with balance and walking    Currently in Pain?  No/denies         [facility] PT Assessment - 04/13/20 0001      Assessment   Medical Diagnosis  Weakness     Referring Provider (PT)  Jenna Luo MD    Onset Date/Surgical Date  12/20/19    Next MD Visit  --   None scheduled    Prior Therapy  Yes       Precautions   Precautions  Fall      Restrictions   Weight  Bearing Restrictions  No      Balance Screen   Has the patient fallen in the past 6 months  No    Has the patient had a decrease in activity level because of a fear of falling?   Yes    Is the patient reluctant to leave their home because of a fear of falling?   Yes      [place]  Private residence    Living Arrangements  Spouse/significant other    Available Help at Discharge  Family    Type of [place]  Multi-level    Alternate Level Stairs-Number of Steps  Balm - 2 wheels      Prior Function   Level of Independence  Independent;Independent with basic ADLs    Vocation  Retired      Associate Professor   Overall Cognitive Status  Impaired/Different from baseline      Sensation   Light Touch  Appears Intact      Posture/Postural Control   Posture/Postural Control  Postural limitations    Postural Limitations  Rounded Shoulders;Flexed trunk      Strength    Right Hip Flexion  4/5    Right Hip ABduction  4/5    Right Hip ADduction  4+/5    Left Hip Flexion  4+/5    Left Hip ABduction  4+/5    Left Hip ADduction  4+/5    Right Knee Flexion  4/5    Right Knee Extension  4/5    Left Knee Flexion  4+/5    Left Knee Extension  4+/5    Right Ankle Dorsiflexion  4+/5    Left Ankle Dorsiflexion  5/5      Transfers   Five time sit to stand comments   3 reps in 17 sec with no UEs, unable to complet 4 th rep due to onset of fatigue, poor eccentric control on attempted 4th rep       Ambulation/Gait   Ambulation/Gait  Yes    Ambulation/Gait Assistance  6: Modified independent (Device/Increase time);5: Supervision    Ambulation Distance (Feet)  170 Feet    Assistive device  Small based quad cane    Gait Pattern  Poor foot clearance - right;Decreased step length - right;Decreased step length - left;Decreased stride length;Decreased dorsiflexion - right;Decreased dorsiflexion - left    Ambulation Surface  Level;Indoor    Gait Comments  2MWT      Balance   Balance Assessed  Yes      Static Standing Balance   Static Standing Balance -  Activities   Tandam Stance - Right Leg;Tandam Stance - Left Leg    Static Standing - Comment/# of Minutes  20 sec; 4 sec                   Objective measurements completed on examination: See above findings.              PT Education - 04/13/20 1533    Education Details  on evaluation findings, and POC    Person(s) Educated  Patient;Spouse    Methods  Explanation    Comprehension  Verbalized understanding       PT Short Term Goals - 04/13/20 1632      PT SHORT TERM GOAL #1   Title  Patient will be independent  with initial HEP and self-management strategies to improve functional outcomes    Time  4    Period  Weeks    Status  New    Target Date  05/15/20      PT SHORT TERM GOAL #2   Title  Patient will be able to perform stand x 5 in < 30 seconds to demonstrate improvement in  functional mobility and reduced risk for falls.    Time  4    Period  Weeks    Status  New    Target Date  05/15/20      PT SHORT TERM GOAL #3   Title  Patient will be able to ambulate at least 225 feet during 2MWT with LRAD to demonstrate improved ability to perform functional mobility and associated tasks.    Time  4    Period  Weeks    Status  New    Target Date  05/15/20        PT Long Term Goals - 04/13/20 1633      PT LONG TERM GOAL #1   Title  Patient will be able to perform stand x 5 in < 15 seconds to demonstrate improvement in functional mobility and reduced risk for falls.    Time  8    Period  Weeks    Status  New    Target Date  06/12/20      PT LONG TERM GOAL #2   Title  Patient will be able to maintain tandem stance >20 seconds on BLEs to improve stability and reduce risk for falls    Time  8    Period  Weeks    Status  New    Target Date  06/12/20      PT LONG TERM GOAL #3   Title  Patient will be able to ambulate at least 350 feet during 2MWT with LRAD to demonstrate improved ability to perform functional mobility and associated tasks.    Time  8    Period  Weeks    Status  New    Target Date  06/12/20      PT LONG TERM GOAL #4   Title  Patient will report at least 75% overall improvement in subjective complaint to indicate improvement in ability to perform ADLs.    Time  8    Period  Weeks    Status  New    Target Date  06/12/20      PT LONG TERM GOAL #5   Title  Patient will improve DGI score by 3 points to demonstrate improvement in functional mobility and reduced risk for falls.    Time  8    Period  Weeks    Status  New    Target Date  06/12/20             Plan - 04/13/20 1628    Clinical Impression Statement  Patient is a 79 y.o. male who presents to physical therapy with complaint of weakness and balance difficulty. Patient demonstrates decreased strength, reduced tolerance to activity, balance deficits and gait abnormalities which  are negatively impacting patient ability to perform ADLs and functional mobility tasks. Patient will benefit from skilled physical therapy services to address these deficits to improve level of function with ADLs, functional mobility tasks, and reduce risk for falls.    Personal Factors and Comorbidities  Age;Comorbidity 3+    Comorbidities  CVA, cervical stenosis, depression    Examination-Activity Limitations  Bathing;Stand;Locomotion  Level;Bed Mobility;Transfers;Stairs;Squat    Examination-Participation Restrictions  Yard Work;Cleaning;Meal Prep;Community Activity    Stability/Clinical Decision Making  Stable/Uncomplicated    Clinical Decision Making  Low    Rehab Potential  Good    PT Frequency  2x / week    PT Duration  8 weeks    PT Treatment/Interventions  ADLs/Self Care Home Management;Aquatic Therapy;Cryotherapy;Electrical Stimulation;Moist Heat;DME Instruction;Gait training;Stair training;Functional mobility training;Therapeutic activities;Therapeutic exercise;Balance training;Neuromuscular re-education;Patient/family education;Orthotic Fit/Training;Manual techniques;Passive range of motion;Biofeedback;Vestibular;Spinal Manipulations;Visual/perceptual remediation/compensation;Dry needling;Energy conservation;Joint Manipulations;Splinting;Taping;Vasopneumatic Device    PT Next Visit Plan  Review goals, issue HEP. Perform DGI next visit. HEP to focus on LE strengtening and balance. Progress LE strength, balance and gait as tolerated    PT Home Exercise Plan  Issue Next visit    Consulted and Agree with Plan of Care  Patient       Patient will benefit from skilled therapeutic intervention in order to improve the following deficits and impairments:  Abnormal gait, Decreased knowledge of use of DME, Improper body mechanics, Decreased coordination, Decreased mobility, Postural dysfunction, Decreased activity tolerance, Decreased endurance, Decreased strength, Decreased balance, Difficulty  walking  Visit Diagnosis: Muscle weakness (generalized)  Other abnormalities of gait and mobility     Problem List Patient Active Problem List   Diagnosis Date Noted  . 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 Vitreomacular traction, left 04/08/2020  . Cystoid macular edema of left eye 04/08/2020  . 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[place]) 01/13/2020  . Acute right-sided weakness 01/13/2020  . Difficulty urinating 01/13/2020  . Stroke ([place])   . CVA (cerebral vascular accident) ([place]) 12/20/2019  . 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[place])   . PVC (premature ventricular contraction)   . Cervical stenosis of spinal canal   . Failure to thrive in adult 05/26/2017  . Failure to thrive (0-17) 05/25/2017  . Abdominal pulsatile mass 05/25/2017  . Anemia 05/25/2017  . Essential hypertension 05/25/2017  . Gait instability 05/20/2017  . Frequent falls 05/20/2017  . Depression 05/20/2017   4:44 PM, 04/13/20 Josue Hector PT DPT  Physical Therapist with [facility]  [phone] [place] [address] [place], [place], [address] Phone: [phone]   Fax:  [phone]  Name: Devin Valdez MRN: [id number] Date of Birth: 1941/11/10

## 2020-04-14 ENCOUNTER — Ambulatory Visit: Payer: Medicare Other | Admitting: Adult Health

## 2020-04-14 ENCOUNTER — Encounter: Payer: Self-pay | Admitting: Adult Health

## 2020-04-14 ENCOUNTER — Ambulatory Visit (INDEPENDENT_AMBULATORY_CARE_PROVIDER_SITE_OTHER): Payer: Medicare Other | Admitting: Adult Health

## 2020-04-14 VITALS — BP 122/73 | HR 73 | Ht 67.0 in | Wt 132.0 lb

## 2020-04-14 DIAGNOSIS — I6350 Cerebral infarction due to unspecified occlusion or stenosis of unspecified cerebral artery: Secondary | ICD-10-CM

## 2020-04-14 DIAGNOSIS — E785 Hyperlipidemia, unspecified: Secondary | ICD-10-CM | POA: Diagnosis not present

## 2020-04-14 DIAGNOSIS — R2689 Other abnormalities of gait and mobility: Secondary | ICD-10-CM

## 2020-04-14 DIAGNOSIS — I69398 Other sequelae of cerebral infarction: Secondary | ICD-10-CM | POA: Diagnosis not present

## 2020-04-14 DIAGNOSIS — I10 Essential (primary) hypertension: Secondary | ICD-10-CM

## 2020-04-14 DIAGNOSIS — I639 Cerebral infarction, unspecified: Secondary | ICD-10-CM

## 2020-04-14 NOTE — Progress Notes (Signed)
[facility] [address] [place]. [place] [address] [phone]       OFFICE CONSULT NOTE  Devin Valdez Date of Birth:  January 01, 1941 Medical Record Number:  [id number]   Referring MD:  Devin Valdez Reason for Referral: stroke  Chief complaint: Chief Complaint  Patient presents with  . Follow-up    tx room with wife  . Cerebrovascular Accident    imbalance, weakness and gait impairment - restarted therapies      HPI:   Today, 04/14/2020, Devin Valdez returns for follow-up accompanied by his wife regarding left paracentral pontine stroke in 11/2019.  Residual deficits of imbalance and gait impairment previously completing therapy in 02/2020 but recently referred back to therapy by PCP due to subjective weakness and gait impairment which was felt due to deconditioning and prior stroke effects.  He does admit to sedentary lifestyle at home due to decreased stamina and joint pain.  He denies new stroke/TIA symptoms.  Continues on aspirin 81 mg daily and atorvastatin for secondary stroke prevention.  Blood pressure today 122/73.  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  No further concerns at this time   History provided for reference purposes only Initial visit 01/16/2020 Dr. Leonie Valdez: Devin Valdez is a 79 year old African-American male with past medical history of hypertension, hyperlipidemia, depression, cervical spinal canal stenosis seen today for initial office consultation visit for stroke.  History is obtained from patient, review of electronic medical records and I personally reviewed imaging films in PACS.  Patient initially presented in January 2021 with right-sided weakness for 2 days prior to admission.  MRI scan showed a left paracentral pontine lacunar infarct and work-up  at that time showed no significant extracranial stenosis on carotid Dopplers and an MRI of the brain.  2D echo showed normal ejection fraction.  LDL was 117 mg percent and hemoglobin A1c was 4.7.  Patient was started on Lipitor and discharged on dual antiplatelet therapy for 3 weeks.  He has no physical therapy needs and was discharged home and was doing well.  He was readmitted on 01/13/2020 with worsening symptoms in the setting of possible dehydration and MRI scan showed extension of the left pontine paramedian infarct.  Patient was asked to continue dual antiplatelet therapy for another 3 weeks and then Plavix alone and stroke work-up was not repeated.  Patient states that he is doing well he is currently doing home physical and occupational therapy.  He still has mild weakness on the right side but is getting better.  Is ambulating with a cane.  He has had no falls or injuries.  Is tolerating aspirin and Plavix without bruising or bleeding and Lipitor without muscle aches and pains.  His blood pressure is well controlled today it is 113/6 7.  He has no complaints.  ROS:   14 system review of systems is positive for weakness, gait difficulty, imbalance, decreased vision and all other systems negative  PMH:  Past Medical History:  Diagnosis Date  . Anemia   . Anxiety   . 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 Depression   . Falls   . Hallucinations   . Hyperlipidemia   . Hypertension   . Stroke [facility])    left paracentral pons infarct    Social History:  Social History   Socioeconomic History  . Marital status: Married    Spouse name: Not on file  . Number of children: Not on file  . Years of education: Not on file  . Highest education level: Not on file  Occupational History  . Not on file  Tobacco Use  . Smoking status: Former Smoker    Quit date: 11/21/1985    Years since quitting: 34.4  . Smokeless tobacco: Never Used  Substance and Sexual Activity  . Alcohol  use: No  . Drug use: No  . Sexual activity: Yes  Other Topics Concern  . Not on file  Social History Narrative  . Not on file   Social Determinants of Health   Financial Resource Strain:   . Difficulty of Paying Living Expenses:   Food Insecurity:   . Worried About Charity fundraiser in the Last Year:   . Arboriculturist in the Last Year:   Transportation Needs:   . Film/video editor (Medical):   Marland Kitchen Lack of Transportation (Non-Medical):   Physical Activity:   . Days of Exercise per Week:   . Minutes of Exercise per Session:   Stress:   . Feeling of Stress :   Social Connections:   . Frequency of Communication with Friends and Family:   . Frequency of Social Gatherings with Friends and Family:   . Attends Religious Services:   . Active Member of Clubs or Organizations:   . Attends Archivist Meetings:   Marland Kitchen Marital Status:   Intimate Partner Violence:   . Fear of Current or Ex-Partner:   . Emotionally Abused:   Marland Kitchen Physically Abused:   . Sexually Abused:     Medications:   Current Outpatient Medications on File Prior to Visit  Medication Sig Dispense Refill  . acetaminophen (TYLENOL) 325 MG tablet Take 650 mg by mouth every 6 (six) hours as needed for mild pain or headache.    Marland Kitchen aspirin 81 MG chewable tablet Chew by mouth daily.    Marland Kitchen atorvastatin (LIPITOR) 40 MG tablet TAKE ONE TABLET (40MG  TOTAL) BY MOUTH DAILY AT 6PM 90 tablet 1  . Coenzyme Q10 (CO Q-10) 200 MG CAPS Take by mouth daily.    . ferrous sulfate 325 (65 FE) MG tablet Take 1 tablet (325 mg total) by mouth 2 (two) times daily with a meal. (Patient taking differently: Take 325 mg by mouth daily with breakfast. ) 60 tablet 1  . losartan (COZAAR) 50 MG tablet TAKE ONE (1) TABLET BY MOUTH EVERY DAY FOR BLOOD PRESSURE (Patient taking differently: Take 50 mg by mouth daily. ) 90 tablet 3  . Multiple Vitamins-Minerals (CENTRUM SILVER ADULT 50+) TABS Take 1 capsule by mouth 2 (two) times daily. 30 tablet 2    . sildenafil (VIAGRA) 100 MG tablet TAKE ONE TABLET (100MG  TOTAL) BY MOUTH DAILY AS NEEDED FOR ERECTILE DYSFUNCTION (Patient taking differently: Take 100 mg by mouth as needed for erectile dysfunction. ) 10 tablet 5   No current facility-administered medications on file prior to visit.    Allergies:  No Known Allergies     Vitals Today's Vitals   04/14/20 0940  BP: 122/73  Pulse: 73  Weight: 132 lb (59.9 kg)  Height: 5\' 7"  (1.702 m)   Body mass index is 20.67 kg/m.   Physical Exam General: Frail pleasant elderly African-American male not in distress., seated, in no evident distress Head: head normocephalic and atraumatic.   Neck: supple  with no carotid or supraclavicular bruits Cardiovascular: regular rate and rhythm, no murmurs Musculoskeletal: no deformity; Skin:  no rash/petichiae Vascular:  Normal pulses all extremities  Neurologic Exam Mental Status: Awake and fully alert.  Fluent speech and language.  Oriented to place and time. Recent and remote memory intact. Attention span, concentration and fund of knowledge appropriate. Mood and affect flat but appropriate.  Cranial Nerves: Pupils equal, briskly reactive to light. Extraocular movements full without nystagmus. Visual fields full to confrontation. Hearing intact. Facial sensation intact. Face, tongue, palate moves normally and symmetrically.  Motor: Normal bulk and tone. Normal strength in all tested extremity muscles except mild RUE decreased dexterity and grip strength and right mild hip flexor weakness. Intermittent resting pill-rolling tremor and very fine postural tremor of the left hand.  Mild cogwheel rigidity upon activation only in the left hand.  No bradykinesia.  Negative glabellar tap. Sensory.: intact to touch , pinprick , position and vibratory sensation.  Coordination: Mildly impaired on the right and normal on the left.  Orbits left arm right arm. Gait and Station: Arises from chair without difficulty.  Stance is normal. Gait demonstrates slow cautious steps and mild imbalance with use of cane.  Tandem walk not attempted. Reflexes: 1+ on left and slightly brisker on the right. Toes downgoing.       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  Vascular risk factors of hypertension, hyperlipidemia and age.  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  Agree with continuation of outpatient physical therapy for hopeful benefit and highly encouraged HEP as recommended during therapy sessions -Continue to follow with PCP for HTN and HLD management -Continue to follow with ophthalmology ongoing -maintain strict control of hypertension with blood pressure goal below 130/90, diabetes with hemoglobin A1c goal below 6.5% and lipids with LDL cholesterol goal below 70 mg/dL. I also advised the patient to eat a healthy diet with plenty of whole grains, cereals, fruits and vegetables, exercise regularly and maintain ideal body weight.  Continue ongoing physical and occupational therapy and use cane at all times for ambulation.  Add coenzyme Q 10 200 mg daily to reduce statin myalgias.    Follow-up in 4 months or call earlier if needed  I spent 35 minutes of face-to-face and non-face-to-face time with patient and wife.  This included previsit chart review, lab review, study review, order entry, electronic health record documentation, patient education  Frann Rider, [facility]  [facility] [address] [place], [place] [address]  Phone [phone] Fax [phone] Note: This document was prepared with digital dictation and possible smart phrase technology. Any transcriptional errors that result from this process are unintentional.

## 2020-04-14 NOTE — Progress Notes (Signed)
I agree with the above plan 

## 2020-04-14 NOTE — Patient Instructions (Signed)
Continue to work with out patient therapies for improvement of balance and ambulation - may consider working with occupational therapy if interested   Continue aspirin 81 mg daily  and atorvastatin for secondary stroke prevention  Continue to follow up with PCP regarding cholesterol and blood pressure management   Continue to monitor blood pressure at home  Maintain strict control of hypertension with blood pressure goal below 130/90, diabetes with hemoglobin A1c goal below 6.5% and cholesterol with LDL cholesterol (bad cholesterol) goal below 70 mg/dL. I also advised the patient to eat a healthy diet with plenty of whole grains, cereals, fruits and vegetables, exercise regularly and maintain ideal body weight.  Followup in the future with me in 4 months or call earlier if needed       Thank you for coming to see [place] at [facility]. I hope we have been able to provide you high quality care today.  You may receive a patient satisfaction survey over the next few weeks. We would appreciate your feedback and comments so that we may continue to improve ourselves and the health of our patients.

## 2020-04-15 ENCOUNTER — Other Ambulatory Visit: Payer: Self-pay

## 2020-04-15 ENCOUNTER — Encounter (HOSPITAL_COMMUNITY): Payer: Self-pay | Admitting: Physical Therapy

## 2020-04-15 ENCOUNTER — Ambulatory Visit (HOSPITAL_COMMUNITY): Payer: Medicare Other | Admitting: Physical Therapy

## 2020-04-15 DIAGNOSIS — R2689 Other abnormalities of gait and mobility: Secondary | ICD-10-CM | POA: Diagnosis not present

## 2020-04-15 DIAGNOSIS — M6281 Muscle weakness (generalized): Secondary | ICD-10-CM

## 2020-04-15 NOTE — Therapy (Signed)
[facility] [address] [place], [place], [address] Phone: [phone]   Fax:  [phone]  Physical Therapy Treatment  Patient Details  Name: Devin Valdez MRN: [id number] Date of Birth: 03/06/1941 Referring Provider (PT): Jenna Luo MD   Encounter Date: 04/15/2020  PT End of Session - 04/15/20 1305    Visit Number  2    Number of Visits  16    Date for PT Re-Evaluation  06/12/20    Authorization Type  Medicare Part A; [place] secondary    Authorization - Visit Number  2    Authorization - Number of Visits  10    Progress Note Due on Visit  10    PT Start Time  1300    PT Stop Time  1340    PT Time Calculation (min)  40 min    Equipment Utilized During Treatment  Gait belt    Activity Tolerance  Patient limited by fatigue    Behavior During Therapy  [facility] for tasks assessed/performed       Past Medical History:  Diagnosis Date  . Anemia   . Anxiety   . 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 Depression   . Falls   . Hallucinations   . Hyperlipidemia   . Hypertension   . Stroke [facility])    left paracentral pons infarct    Past Surgical History:  Procedure Laterality Date  . EYE SURGERY    . HERNIA REPAIR    . POSTERIOR CERVICAL LAMINECTOMY     with fusion C3-C7 10/19- Dr. Brigitte Pulse [facility]    There were no vitals filed for this visit.  Subjective Assessment - 04/15/20 1304    Subjective  Patient says he feels a little tired today. Reports some tingling in his hands and pain in his back.    Patient is accompained by:  Family member   wife   Pertinent History  S/P Left Pontine Artery CVA; Extension of stroke 01/13/20    Limitations  Walking;House hold activities;Standing;Lifting    How long can you stand comfortably?  3-4 min    How long can you walk comfortably?  3-5 min    Diagnostic tests  MRI 12/20/19: Acute brainstem infarct; MRI 01/13/20: extension of stroke    Patient Stated  Goals  Get back to where I was with balance and walking    Currently in Pain?  Yes    Pain Score  7     Pain Location  Back    Pain Orientation  Posterior    Pain Descriptors / Indicators  Nagging    Pain Type  Chronic pain         [facility] PT Assessment - 04/15/20 0001      Dynamic Gait Index   Level Surface  Mild Impairment    Change in Gait Speed  Moderate Impairment    Gait with Horizontal Head Turns  Mild Impairment    Gait with Vertical Head Turns  Mild Impairment    Gait and Pivot Turn  Normal    Step Over Obstacle  Mild Impairment    Step Around Obstacles  Mild Impairment    Steps  Mild Impairment    Total Score  16                    [facility] Adult PT Treatment/Exercise - 04/15/20 0001      Knee/Hip Exercises:  Standing   Heel Raises  Both;15 reps      Knee/Hip Exercises: Seated   Sit to Sand  5 reps;with UE support               PT Short Term Goals - 04/13/20 1632      PT SHORT TERM GOAL #1   Title  Patient will be independent with initial HEP and self-management strategies to improve functional outcomes    Time  4    Period  Weeks    Status  New    Target Date  05/15/20      PT SHORT TERM GOAL #2   Title  Patient will be able to perform stand x 5 in < 30 seconds to demonstrate improvement in functional mobility and reduced risk for falls.    Time  4    Period  Weeks    Status  New    Target Date  05/15/20      PT SHORT TERM GOAL #3   Title  Patient will be able to ambulate at least 225 feet during 2MWT with LRAD to demonstrate improved ability to perform functional mobility and associated tasks.    Time  4    Period  Weeks    Status  New    Target Date  05/15/20        PT Long Term Goals - 04/13/20 1633      PT LONG TERM GOAL #1   Title  Patient will be able to perform stand x 5 in < 15 seconds to demonstrate improvement in functional mobility and reduced risk for falls.    Time  8    Period  Weeks    Status  New    Target  Date  06/12/20      PT LONG TERM GOAL #2   Title  Patient will be able to maintain tandem stance >20 seconds on BLEs to improve stability and reduce risk for falls    Time  8    Period  Weeks    Status  New    Target Date  06/12/20      PT LONG TERM GOAL #3   Title  Patient will be able to ambulate at least 350 feet during 2MWT with LRAD to demonstrate improved ability to perform functional mobility and associated tasks.    Time  8    Period  Weeks    Status  New    Target Date  06/12/20      PT LONG TERM GOAL #4   Title  Patient will report at least 75% overall improvement in subjective complaint to indicate improvement in ability to perform ADLs.    Time  8    Period  Weeks    Status  New    Target Date  06/12/20      PT LONG TERM GOAL #5   Title  Patient will improve DGI score by 3 points to demonstrate improvement in functional mobility and reduced risk for falls.    Time  8    Period  Weeks    Status  New    Target Date  06/12/20            Plan - 04/15/20 1340    Clinical Impression Statement  Completed DGI today. Patient with noted limitations in balance as well as tolerance to activity. Patient limited by increased fatigue during today's treatment and requires several breaks for rest. Activity graded per patient tolerance.  Educated patient on and issued HEP handout for improved LE strengthening and tolerance to functional activity. Patient will continue to benefit from skilled therapy services to progress LE strength and balance to improve functional mobility and reduce risk for falls.    Personal Factors and Comorbidities  Age;Comorbidity 3+    Comorbidities  CVA, cervical stenosis, depression    Examination-Activity Limitations  Bathing;Stand;Locomotion Level;Bed Mobility;Transfers;Stairs;Squat    Examination-Participation Restrictions  Yard Work;Cleaning;Meal Prep;Community Activity    Stability/Clinical Decision Making  Stable/Uncomplicated    Rehab Potential   Good    PT Frequency  2x / week    PT Duration  8 weeks    PT Treatment/Interventions  ADLs/Self Care Home Management;Aquatic Therapy;Cryotherapy;Electrical Stimulation;Moist Heat;DME Instruction;Gait training;Stair training;Functional mobility training;Therapeutic activities;Therapeutic exercise;Balance training;Neuromuscular re-education;Patient/family education;Orthotic Fit/Training;Manual techniques;Passive range of motion;Biofeedback;Vestibular;Spinal Manipulations;Visual/perceptual remediation/compensation;Dry needling;Energy conservation;Joint Manipulations;Splinting;Taping;Vasopneumatic Device    PT Next Visit Plan  . Progress LE strength, balance and gait as tolerated. Add static balance and step ups if able next visit    PT Home Exercise Plan  04/15/20: sit/stand, heel raise    Consulted and Agree with Plan of Care  Patient       Patient will benefit from skilled therapeutic intervention in order to improve the following deficits and impairments:  Abnormal gait, Decreased knowledge of use of DME, Improper body mechanics, Decreased coordination, Decreased mobility, Postural dysfunction, Decreased activity tolerance, Decreased endurance, Decreased strength, Decreased balance, Difficulty walking  Visit Diagnosis: Muscle weakness (generalized)  Other abnormalities of gait and mobility     Problem List Patient Active Problem List   Diagnosis Date Noted  . 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 Vitreomacular traction, left 04/08/2020  . Cystoid macular edema of left eye 04/08/2020  . 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[place]) 01/13/2020  . Acute right-sided weakness 01/13/2020  . Difficulty urinating 01/13/2020  . Stroke ([place])   . CVA (cerebral vascular accident) ([place]) 12/20/2019  . 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[place])   . PVC (premature ventricular contraction)   . Cervical stenosis of spinal canal   . Failure to thrive in adult 05/26/2017  . Failure to  thrive (0-17) 05/25/2017  . Abdominal pulsatile mass 05/25/2017  . Anemia 05/25/2017  . Essential hypertension 05/25/2017  . Gait instability 05/20/2017  . Frequent falls 05/20/2017  . Depression 05/20/2017    1:43 PM, 04/15/20 Josue Hector PT DPT  Physical Therapist with [facility]  [phone] [place], [place], [address] Phone: [phone]   Fax:  [phone]  Name: Devin Valdez MRN: [id number] Date of Birth: 05-30-1941

## 2020-04-15 NOTE — Patient Instructions (Signed)
Access Code: [facility] URL: [email or web address] Date: 04/15/2020 Prepared by: Josue Hector  Exercises Sit to Stand with Armchair - 3 x daily - 7 x weekly - 2 sets - 5 reps Heel rises with counter support - 3 x daily - 7 x weekly - 2 sets - 10 reps

## 2020-04-22 ENCOUNTER — Ambulatory Visit (HOSPITAL_COMMUNITY): Payer: Medicare Other | Attending: Family Medicine | Admitting: Physical Therapy

## 2020-04-22 ENCOUNTER — Other Ambulatory Visit: Payer: Self-pay

## 2020-04-22 DIAGNOSIS — R29818 Other symptoms and signs involving the nervous system: Secondary | ICD-10-CM

## 2020-04-22 DIAGNOSIS — R2689 Other abnormalities of gait and mobility: Secondary | ICD-10-CM | POA: Diagnosis not present

## 2020-04-22 DIAGNOSIS — M6281 Muscle weakness (generalized): Secondary | ICD-10-CM | POA: Insufficient documentation

## 2020-04-22 NOTE — Therapy (Signed)
[place], [place], [address] Phone: [phone]   Fax:  [phone]  Physical Therapy Treatment  Patient Details  Name: Devin Valdez MRN: [id number] Date of Birth: Aug 02, 1941 Referring Provider (PT): Jenna Luo MD   Encounter Date: 04/22/2020  PT End of Session - 04/22/20 1446    Visit Number  3    Number of Visits  16    Date for PT Re-Evaluation  06/12/20    Authorization Type  Medicare Part A; [place] secondary    Authorization - Visit Number  3    Authorization - Number of Visits  10    Progress Note Due on Visit  10    PT Start Time  4166    PT Stop Time  1446    PT Time Calculation (min)  41 min    Equipment Utilized During Treatment  Gait belt    Activity Tolerance  Patient limited by fatigue    Behavior During Therapy  [facility] for tasks assessed/performed       Past Medical History:  Diagnosis Date  . Anemia   . Anxiety   . 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 Depression   . Falls   . Hallucinations   . Hyperlipidemia   . Hypertension   . Stroke [facility])    left paracentral pons infarct    Past Surgical History:  Procedure Laterality Date  . EYE SURGERY    . HERNIA REPAIR    . POSTERIOR CERVICAL LAMINECTOMY     with fusion C3-C7 10/19- Dr. Brigitte Pulse at [facility]    There were no vitals filed for this visit.  Subjective Assessment - 04/22/20 1411    Subjective  Pt states he is doing well today, doing his exercises when he can.    Currently in Pain?  No/denies                        [facility] Adult PT Treatment/Exercise - 04/22/20 0001      Knee/Hip Exercises: Standing   Heel Raises  Both;15 reps    Heel Raises Limitations  toerasies 10 reps    Hip Flexion  Both;10 reps;Limitations    Hip Flexion Limitations  alternating without UE's    Forward Lunges  Both;10 reps;Limitations    Forward Lunges Limitations  onto 4" no UE assist    Hip  Abduction  Both;10 reps    Abduction Limitations  2 sets    Hip Extension  Both;10 reps    Extension Limitations  2 sets    SLS  max Lt:13",  Rt: 10" wihtout UE    SLS with Vectors  5X3" with 1 UE assist    Other Standing Knee Exercises  tandem stance 2X30" each       Knee/Hip Exercises: Seated   Sit to Sand  10 reps;with UE support               PT Short Term Goals - 04/13/20 1632      PT SHORT TERM GOAL #1   Title  Patient will be independent with initial HEP and self-management strategies to improve functional outcomes    Time  4    Period  Weeks    Status  New    Target Date  05/15/20      PT SHORT TERM GOAL #2   Title  Patient will be able  to perform stand x 5 in < 30 seconds to demonstrate improvement in functional mobility and reduced risk for falls.    Time  4    Period  Weeks    Status  New    Target Date  05/15/20      PT SHORT TERM GOAL #3   Title  Patient will be able to ambulate at least 225 feet during 2MWT with LRAD to demonstrate improved ability to perform functional mobility and associated tasks.    Time  4    Period  Weeks    Status  New    Target Date  05/15/20        PT Long Term Goals - 04/13/20 1633      PT LONG TERM GOAL #1   Title  Patient will be able to perform stand x 5 in < 15 seconds to demonstrate improvement in functional mobility and reduced risk for falls.    Time  8    Period  Weeks    Status  New    Target Date  06/12/20      PT LONG TERM GOAL #2   Title  Patient will be able to maintain tandem stance >20 seconds on BLEs to improve stability and reduce risk for falls    Time  8    Period  Weeks    Status  New    Target Date  06/12/20      PT LONG TERM GOAL #3   Title  Patient will be able to ambulate at least 350 feet during 2MWT with LRAD to demonstrate improved ability to perform functional mobility and associated tasks.    Time  8    Period  Weeks    Status  New    Target Date  06/12/20      PT LONG TERM GOAL  #4   Title  Patient will report at least 75% overall improvement in subjective complaint to indicate improvement in ability to perform ADLs.    Time  8    Period  Weeks    Status  New    Target Date  06/12/20      PT LONG TERM GOAL #5   Title  Patient will improve DGI score by 3 points to demonstrate improvement in functional mobility and reduced risk for falls.    Time  8    Period  Weeks    Status  New    Target Date  06/12/20            Plan - 04/22/20 1447    Clinical Impression Statement  Focused on LE strenghtening and stability today.  Reluctant to try static balance activities without UE but able to successfully complete with encouragement.  Pt did require frequent short rest breaks during session.   Added LE strengthening with cues for form and posturing.  Max hold of less than 15 seconds on both without use of UE's.  Pt tends to reach to stand rather than push up from chair so will need continued reinforcement wit this as well.    Personal Factors and Comorbidities  Age;Comorbidity 3+    Comorbidities  CVA, cervical stenosis, depression    Examination-Activity Limitations  Bathing;Stand;Locomotion Level;Bed Mobility;Transfers;Stairs;Squat    Examination-Participation Restrictions  Yard Work;Cleaning;Meal Prep;Community Activity    Stability/Clinical Decision Making  Stable/Uncomplicated    Rehab Potential  Good    PT Frequency  2x / week    PT Duration  8 weeks  PT Treatment/Interventions  ADLs/Self Care Home Management;Aquatic Therapy;Cryotherapy;Electrical Stimulation;Moist Heat;DME Instruction;Gait training;Stair training;Functional mobility training;Therapeutic activities;Therapeutic exercise;Balance training;Neuromuscular re-education;Patient/family education;Orthotic Fit/Training;Manual techniques;Passive range of motion;Biofeedback;Vestibular;Spinal Manipulations;Visual/perceptual remediation/compensation;Dry needling;Energy conservation;Joint  Manipulations;Splinting;Taping;Vasopneumatic Device    PT Next Visit Plan  Progress LE strength, balance and gait as tolerated. Add step ups if able next visit    PT Home Exercise Plan  04/15/20: sit/stand, heel raise    Consulted and Agree with Plan of Care  Patient       Patient will benefit from skilled therapeutic intervention in order to improve the following deficits and impairments:  Abnormal gait, Decreased knowledge of use of DME, Improper body mechanics, Decreased coordination, Decreased mobility, Postural dysfunction, Decreased activity tolerance, Decreased endurance, Decreased strength, Decreased balance, Difficulty walking  Visit Diagnosis: Muscle weakness (generalized)  Other abnormalities of gait and mobility  Other symptoms and signs involving the nervous system     Problem List Patient Active Problem List   Diagnosis Date Noted  . 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 Vitreomacular traction, left 04/08/2020  . Cystoid macular edema of left eye 04/08/2020  . 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 Acute right-sided weakness 01/13/2020  . Difficulty urinating 01/13/2020  . Stroke ([place])   . CVA (cerebral vascular accident) ([place]) 12/20/2019  . 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 PVC (premature ventricular contraction)   . Cervical stenosis of spinal canal   . Failure to thrive in adult 05/26/2017  . Failure to thrive (0-17) 05/25/2017  . Abdominal pulsatile mass 05/25/2017  . Anemia 05/25/2017  . Essential hypertension 05/25/2017  . Gait instability 05/20/2017  . Frequent falls 05/20/2017  . Depression 05/20/2017   Teena Irani, PTA/CLT [phone]  Teena Irani 04/22/2020, 2:50 PM  [facility] [address] [place], [place], [address] Phone: [phone]   Fax:  [phone]  Name: Devin Valdez MRN: [id number] Date of Birth: May 25, 1941

## 2020-04-28 ENCOUNTER — Encounter (HOSPITAL_COMMUNITY): Payer: Self-pay | Admitting: Physical Therapy

## 2020-04-28 ENCOUNTER — Other Ambulatory Visit: Payer: Self-pay

## 2020-04-28 ENCOUNTER — Ambulatory Visit (HOSPITAL_COMMUNITY): Payer: Medicare Other | Admitting: Physical Therapy

## 2020-04-28 DIAGNOSIS — R2689 Other abnormalities of gait and mobility: Secondary | ICD-10-CM | POA: Diagnosis not present

## 2020-04-28 DIAGNOSIS — M6281 Muscle weakness (generalized): Secondary | ICD-10-CM | POA: Diagnosis not present

## 2020-04-28 DIAGNOSIS — R29818 Other symptoms and signs involving the nervous system: Secondary | ICD-10-CM

## 2020-04-28 NOTE — Therapy (Signed)
[place], [place], [address] Phone: [phone]   Fax:  [phone]  Physical Therapy Treatment  Patient Details  Name: Devin Valdez MRN: [id number] Date of Birth: [birth year] Referring Provider (PT): Jenna Luo MD   Encounter Date: 04/28/2020  PT End of Session - 04/28/20 1343    Visit Number  4    Number of Visits  16    Date for PT Re-Evaluation  06/12/20    Authorization Type  Medicare Part A; [place] secondary    Authorization - Visit Number  4    Authorization - Number of Visits  10    Progress Note Due on Visit  10    PT Start Time  4765    PT Stop Time  1430    PT Time Calculation (min)  45 min    Equipment Utilized During Treatment  Gait belt    Activity Tolerance  Patient limited by fatigue    Behavior During Therapy  [facility] for tasks assessed/performed       Past Medical History:  Diagnosis Date  . Anemia   . Anxiety   . 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 Depression   . Falls   . Hallucinations   . Hyperlipidemia   . Hypertension   . Stroke [facility])    left paracentral pons infarct    Past Surgical History:  Procedure Laterality Date  . EYE SURGERY    . HERNIA REPAIR    . POSTERIOR CERVICAL LAMINECTOMY     with fusion C3-C7 10/19- Dr. Brigitte Pulse at [facility]    There were no vitals filed for this visit.  Subjective Assessment - 04/28/20 1350    Subjective  Patient reports no new issues since last visit. Says he has been doing exercise at home, but still gets fatigued after a while. Says he feels his balance is improving.    Currently in Pain?  No/denies                        [facility] Adult PT Treatment/Exercise - 04/28/20 0001      Knee/Hip Exercises: Standing   Heel Raises  Both;15 reps    Heel Raises Limitations  toe raise x15    Hip Abduction  Both;10 reps    Abduction Limitations  2 sets    Hip Extension  Both;10 reps     Extension Limitations  2 sets    Forward Step Up  Both;1 set;10 reps;Hand Hold: 2;Step Height: 4"    Gait Training  50 feet with quad cane    Other Standing Knee Exercises  tandem stance 2X30" each       Knee/Hip Exercises: Seated   Sit to Sand  10 reps;with UE support               PT Short Term Goals - 04/13/20 1632      PT SHORT TERM GOAL #1   Title  Patient will be independent with initial HEP and self-management strategies to improve functional outcomes    Time  4    Period  Weeks    Status  New    Target Date  05/15/20      PT SHORT TERM GOAL #2   Title  Patient will be able to perform stand x 5 in < 30 seconds to demonstrate improvement in functional mobility and reduced risk for falls.  Time  4    Period  Weeks    Status  New    Target Date  05/15/20      PT SHORT TERM GOAL #3   Title  Patient will be able to ambulate at least 225 feet during 2MWT with LRAD to demonstrate improved ability to perform functional mobility and associated tasks.    Time  4    Period  Weeks    Status  New    Target Date  05/15/20        PT Long Term Goals - 04/13/20 1633      PT LONG TERM GOAL #1   Title  Patient will be able to perform stand x 5 in < 15 seconds to demonstrate improvement in functional mobility and reduced risk for falls.    Time  8    Period  Weeks    Status  New    Target Date  06/12/20      PT LONG TERM GOAL #2   Title  Patient will be able to maintain tandem stance >20 seconds on BLEs to improve stability and reduce risk for falls    Time  8    Period  Weeks    Status  New    Target Date  06/12/20      PT LONG TERM GOAL #3   Title  Patient will be able to ambulate at least 350 feet during 2MWT with LRAD to demonstrate improved ability to perform functional mobility and associated tasks.    Time  8    Period  Weeks    Status  New    Target Date  06/12/20      PT LONG TERM GOAL #4   Title  Patient will report at least 75% overall improvement  in subjective complaint to indicate improvement in ability to perform ADLs.    Time  8    Period  Weeks    Status  New    Target Date  06/12/20      PT LONG TERM GOAL #5   Title  Patient will improve DGI score by 3 points to demonstrate improvement in functional mobility and reduced risk for falls.    Time  8    Period  Weeks    Status  New    Target Date  06/12/20            Plan - 04/28/20 1430    Clinical Impression Statement  Patient continues to be limited by fatigue with functional activity. Patient requires several breaks for rest during session today. Patient with good return for ther ex, requiring min cues for upright posturing and foot position during static balance activity. Patient will continue to benefit from skilled therapy services to progress LE strength and tolerance to functional activity to improve functional mobility and ability to perform ADLs.    Personal Factors and Comorbidities  Age;Comorbidity 3+    Comorbidities  CVA, cervical stenosis, depression    Examination-Activity Limitations  Bathing;Stand;Locomotion Level;Bed Mobility;Transfers;Stairs;Squat    Examination-Participation Restrictions  Yard Work;Cleaning;Meal Prep;Community Activity    Stability/Clinical Decision Making  Stable/Uncomplicated    Rehab Potential  Good    PT Frequency  2x / week    PT Duration  8 weeks    PT Treatment/Interventions  ADLs/Self Care Home Management;Aquatic Therapy;Cryotherapy;Electrical Stimulation;Moist Heat;DME Instruction;Gait training;Stair training;Functional mobility training;Therapeutic activities;Therapeutic exercise;Balance training;Neuromuscular re-education;Patient/family education;Orthotic Fit/Training;Manual techniques;Passive range of motion;Biofeedback;Vestibular;Spinal Manipulations;Visual/perceptual remediation/compensation;Dry needling;Energy conservation;Joint Manipulations;Splinting;Taping;Vasopneumatic Device    PT Next  Visit Plan  Progress LE strength,  balance and gait as tolerated. Increase ambulation distance as able.    PT Home Exercise Plan  04/15/20: sit/stand, heel raise    Consulted and Agree with Plan of Care  Patient       Patient will benefit from skilled therapeutic intervention in order to improve the following deficits and impairments:  Abnormal gait, Decreased knowledge of use of DME, Improper body mechanics, Decreased coordination, Decreased mobility, Postural dysfunction, Decreased activity tolerance, Decreased endurance, Decreased strength, Decreased balance, Difficulty walking  Visit Diagnosis: Muscle weakness (generalized)  Other abnormalities of gait and mobility  Other symptoms and signs involving the nervous system     Problem List Patient Active Problem List   Diagnosis Date Noted  . 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 Vitreomacular traction, left 04/08/2020  . Cystoid macular edema of left eye 04/08/2020  . 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 Acute right-sided weakness 01/13/2020  . Difficulty urinating 01/13/2020  . Stroke ([place])   . CVA (cerebral vascular accident) ([place]) 12/20/2019  . 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[place])   . PVC (premature ventricular contraction)   . Cervical stenosis of spinal canal   . Failure to thrive in adult 05/26/2017  . Failure to thrive (0-17) 05/25/2017  . Abdominal pulsatile mass 05/25/2017  . Anemia 05/25/2017  . Essential hypertension 05/25/2017  . Gait instability 05/20/2017  . Frequent falls 05/20/2017  . Depression 05/20/2017    5:32 PM, 04/28/20 Josue Hector PT DPT  Physical Therapist with [facility]  [phone] [place] [address] [place], [place], [address] Phone: [phone]   Fax:  [phone]  Name: Devin Valdez MRN: [id number] Date of Birth: 1941/10/29

## 2020-04-29 ENCOUNTER — Encounter (HOSPITAL_COMMUNITY): Payer: Self-pay | Admitting: Physical Therapy

## 2020-04-29 ENCOUNTER — Ambulatory Visit (HOSPITAL_COMMUNITY): Payer: Medicare Other | Admitting: Physical Therapy

## 2020-04-29 DIAGNOSIS — R29818 Other symptoms and signs involving the nervous system: Secondary | ICD-10-CM

## 2020-04-29 DIAGNOSIS — R2689 Other abnormalities of gait and mobility: Secondary | ICD-10-CM | POA: Diagnosis not present

## 2020-04-29 DIAGNOSIS — M6281 Muscle weakness (generalized): Secondary | ICD-10-CM

## 2020-04-29 NOTE — Therapy (Signed)
[facility] [address] [place], [place], [address] Phone: [phone]   Fax:  [phone]  Physical Therapy Treatment  Patient Details  Name: Devin Valdez MRN: [id number] Date of Birth: December 04, 1940 Referring Provider (PT): Jenna Luo MD   Encounter Date: 04/29/2020  PT End of Session - 04/29/20 1124    Visit Number  5    Number of Visits  16    Date for PT Re-Evaluation  06/12/20    Authorization Type  Medicare Part A; [place] secondary    Authorization - Visit Number  5    Authorization - Number of Visits  10    Progress Note Due on Visit  10    PT Start Time  1120    PT Stop Time  1200    PT Time Calculation (min)  40 min    Equipment Utilized During Treatment  Gait belt    Activity Tolerance  Patient limited by fatigue    Behavior During Therapy  [facility] for tasks assessed/performed       Past Medical History:  Diagnosis Date   Anemia    Anxiety    Cervical stenosis of spinal canal    mri 8/19- severe   Depression    Falls    Hallucinations    Hyperlipidemia    Hypertension    Stroke ([place])    left paracentral pons infarct    Past Surgical History:  Procedure Laterality Date   [place]     with fusion C3-C7 10/19- Dr. Brigitte Pulse at [facility]    There were no vitals filed for this visit.  Subjective Assessment - 04/29/20 1123    Subjective  Patient says he was fatigued after yesterdays session. Says he has not done much yet today other than showering which made him a little tired. Patient says his legs a little tired right now, but he is doing well otherwise.    Currently in Pain?  No/denies                        [facility] Adult PT Treatment/Exercise - 04/29/20 0001      Knee/Hip Exercises: Standing   Heel Raises  Both;15 reps    Forward Step Up  Both;1 set;10 reps;Hand Hold: 2;Step Height: 4"    Gait Training  50  feet with quad cane    Other Standing Knee Exercises  tandem stance 2X30" each     Other Standing Knee Exercises  sidestepping in // bars 3RT HHA x 2       Knee/Hip Exercises: Seated   Long Arc Quad  Both;10 reps    Other Seated Knee/Hip Exercises  seated heel/ toe raises x 20    Marching  Both;10 reps    Sit to Sand  10 reps;with UE support               PT Short Term Goals - 04/13/20 1632      PT SHORT TERM GOAL #1   Title  Patient will be independent with initial HEP and self-management strategies to improve functional outcomes    Time  4    Period  Weeks    Status  New    Target Date  05/15/20      PT SHORT TERM GOAL #2   Title  Patient will be able to perform stand x 5  in < 30 seconds to demonstrate improvement in functional mobility and reduced risk for falls.    Time  4    Period  Weeks    Status  New    Target Date  05/15/20      PT SHORT TERM GOAL #3   Title  Patient will be able to ambulate at least 225 feet during 2MWT with LRAD to demonstrate improved ability to perform functional mobility and associated tasks.    Time  4    Period  Weeks    Status  New    Target Date  05/15/20        PT Long Term Goals - 04/13/20 1633      PT LONG TERM GOAL #1   Title  Patient will be able to perform stand x 5 in < 15 seconds to demonstrate improvement in functional mobility and reduced risk for falls.    Time  8    Period  Weeks    Status  New    Target Date  06/12/20      PT LONG TERM GOAL #2   Title  Patient will be able to maintain tandem stance >20 seconds on BLEs to improve stability and reduce risk for falls    Time  8    Period  Weeks    Status  New    Target Date  06/12/20      PT LONG TERM GOAL #3   Title  Patient will be able to ambulate at least 350 feet during 2MWT with LRAD to demonstrate improved ability to perform functional mobility and associated tasks.    Time  8    Period  Weeks    Status  New    Target Date  06/12/20      PT LONG  TERM GOAL #4   Title  Patient will report at least 75% overall improvement in subjective complaint to indicate improvement in ability to perform ADLs.    Time  8    Period  Weeks    Status  New    Target Date  06/12/20      PT LONG TERM GOAL #5   Title  Patient will improve DGI score by 3 points to demonstrate improvement in functional mobility and reduced risk for falls.    Time  8    Period  Weeks    Status  New    Target Date  06/12/20            Plan - 04/29/20 1208    Clinical Impression Statement  Patient very limited by fatigue today. Patient required rest breaks after all activity. Activity graded per patient tolerance today. Added seated LE strengthening to accommodate patient fatigue with standing activity. Educated patient on proper form and function of added exercise, and discussed activity modification/ pacing at home to avoid excess fatigue. Issued updated HEP handout. Patient will continue to benefit from skilled therapy services to progress tolerance to functional activity and balance to improve functional mobility and reduce risk for falls.    Personal Factors and Comorbidities  Age;Comorbidity 3+    Comorbidities  CVA, cervical stenosis, depression    Examination-Activity Limitations  Bathing;Stand;Locomotion Level;Bed Mobility;Transfers;Stairs;Squat    Examination-Participation Restrictions  Yard Work;Cleaning;Meal Prep;Community Activity    Stability/Clinical Decision Making  Stable/Uncomplicated    Rehab Potential  Good    PT Frequency  2x / week    PT Duration  8 weeks    PT Treatment/Interventions  ADLs/Self Care Home Management;Aquatic Therapy;Cryotherapy;Electrical Stimulation;Moist Heat;DME Instruction;Gait training;Stair training;Functional mobility training;Therapeutic activities;Therapeutic exercise;Balance training;Neuromuscular re-education;Patient/family education;Orthotic Fit/Training;Manual techniques;Passive range of  motion;Biofeedback;Vestibular;Spinal Manipulations;Visual/perceptual remediation/compensation;Dry needling;Energy conservation;Joint Manipulations;Splinting;Taping;Vasopneumatic Device    PT Next Visit Plan  Progress LE strength, balance and gait as tolerated. Increase ambulation distance as able.    PT Home Exercise Plan  04/15/20: sit/stand, heel raise 04/29/20: seated march, LAQs, seated heel/ toe raise    Consulted and Agree with Plan of Care  Patient       Patient will benefit from skilled therapeutic intervention in order to improve the following deficits and impairments:  Abnormal gait, Decreased knowledge of use of DME, Improper body mechanics, Decreased coordination, Decreased mobility, Postural dysfunction, Decreased activity tolerance, Decreased endurance, Decreased strength, Decreased balance, Difficulty walking  Visit Diagnosis: Muscle weakness (generalized)  Other abnormalities of gait and mobility  Other symptoms and signs involving the nervous system     Problem List Patient Active Problem List   Diagnosis Date Noted   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 04/08/2020   Vitreomacular traction, left 04/08/2020   Cystoid macular edema of left eye 04/08/2020   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[place]) 01/13/2020   Acute right-sided weakness 01/13/2020   Difficulty urinating 01/13/2020   Stroke ([place])    CVA (cerebral vascular accident) ([place]) 12/20/2019   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 [place], [place], [address] Phone: [phone]   Fax:  [phone]  Name: Devin Valdez MRN: [id number] Date of Birth: Feb 20, 1941

## 2020-04-29 NOTE — Patient Instructions (Signed)
Access Code: [email or web address] URL: [email or web address] Date: 04/29/2020 Prepared by: Josue Hector  Exercises Seated March - 2 x daily - 7 x weekly - 2 sets - 10 reps Seated Long Arc Quad - 2 x daily - 7 x weekly - 2 sets - 10 reps Seated Heel Toe Raises - 2 x daily - 7 x weekly - 2 sets - 10 reps

## 2020-04-30 ENCOUNTER — Encounter (HOSPITAL_COMMUNITY): Payer: Medicare Other | Admitting: Physical Therapy

## 2020-05-04 ENCOUNTER — Other Ambulatory Visit: Payer: Self-pay

## 2020-05-04 ENCOUNTER — Ambulatory Visit (HOSPITAL_COMMUNITY): Payer: Medicare Other | Admitting: Physical Therapy

## 2020-05-04 ENCOUNTER — Encounter (HOSPITAL_COMMUNITY): Payer: Self-pay | Admitting: Physical Therapy

## 2020-05-04 DIAGNOSIS — R2689 Other abnormalities of gait and mobility: Secondary | ICD-10-CM | POA: Diagnosis not present

## 2020-05-04 DIAGNOSIS — R29818 Other symptoms and signs involving the nervous system: Secondary | ICD-10-CM | POA: Diagnosis not present

## 2020-05-04 DIAGNOSIS — M6281 Muscle weakness (generalized): Secondary | ICD-10-CM | POA: Diagnosis not present

## 2020-05-04 NOTE — Therapy (Signed)
[place], [place], [address] Phone: [phone]   Fax:  [phone]  Physical Therapy Treatment  Patient Details  Name: Devin Valdez MRN: [id number] Date of Birth: [birth year] Referring Provider (PT): Jenna Luo MD   Encounter Date: 05/04/2020   PT End of Session - 05/04/20 1354    Visit Number 6    Number of Visits 16    Date for PT Re-Evaluation 06/12/20    Authorization Type Medicare Part A; [place] secondary    Authorization - Visit Number 6    Authorization - Number of Visits 10    Progress Note Due on Visit 10    PT Start Time 1350    PT Stop Time 1430    PT Time Calculation (min) 40 min    Equipment Utilized During Treatment Gait belt    Activity Tolerance Patient limited by fatigue    Behavior During Therapy [facility] for tasks assessed/performed           Past Medical History:  Diagnosis Date  . Anemia   . Anxiety   . 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 Depression   . Falls   . Hallucinations   . Hyperlipidemia   . Hypertension   . Stroke [facility])    left paracentral pons infarct    Past Surgical History:  Procedure Laterality Date  . EYE SURGERY    . HERNIA REPAIR    . POSTERIOR CERVICAL LAMINECTOMY     with fusion C3-C7 10/19- Dr. Brigitte Pulse at [facility]    There were no vitals filed for this visit.   Subjective Assessment - 05/04/20 1352    Subjective Patient says he was fatigued after last session. Says he has been doing his exercises at home and are going well. Feels that he has recuperated since.    Currently in Pain? No/denies                             [facility] Adult PT Treatment/Exercise - 05/04/20 0001      Knee/Hip Exercises: Standing   Heel Raises Both;15 reps    Forward Step Up Both;1 set;10 reps;Hand Hold: 2;Step Height: 4"    Gait Training 226 feet with quad cane     Other Standing Knee Exercises tandem stance 2X30"  each, NBOS with head turns on solid floor x 10, x 10 on foam     Other Standing Knee Exercises sidestepping 15 feet 2RT with min gaurd      Knee/Hip Exercises: Seated   Sit to Sand 10 reps;with UE support                    PT Short Term Goals - 04/13/20 1632      PT SHORT TERM GOAL #1   Title Patient will be independent with initial HEP and self-management strategies to improve functional outcomes    Time 4    Period Weeks    Status New    Target Date 05/15/20      PT SHORT TERM GOAL #2   Title Patient will be able to perform stand x 5 in < 30 seconds to demonstrate improvement in functional mobility and reduced risk for falls.    Time 4    Period Weeks    Status New    Target Date 05/15/20      PT  SHORT TERM GOAL #3   Title Patient will be able to ambulate at least 225 feet during 2MWT with LRAD to demonstrate improved ability to perform functional mobility and associated tasks.    Time 4    Period Weeks    Status New    Target Date 05/15/20             PT Long Term Goals - 04/13/20 1633      PT LONG TERM GOAL #1   Title Patient will be able to perform stand x 5 in < 15 seconds to demonstrate improvement in functional mobility and reduced risk for falls.    Time 8    Period Weeks    Status New    Target Date 06/12/20      PT LONG TERM GOAL #2   Title Patient will be able to maintain tandem stance >20 seconds on BLEs to improve stability and reduce risk for falls    Time 8    Period Weeks    Status New    Target Date 06/12/20      PT LONG TERM GOAL #3   Title Patient will be able to ambulate at least 350 feet during 2MWT with LRAD to demonstrate improved ability to perform functional mobility and associated tasks.    Time 8    Period Weeks    Status New    Target Date 06/12/20      PT LONG TERM GOAL #4   Title Patient will report at least 75% overall improvement in subjective complaint to indicate improvement in ability to perform ADLs.    Time  8    Period Weeks    Status New    Target Date 06/12/20      PT LONG TERM GOAL #5   Title Patient will improve DGI score by 3 points to demonstrate improvement in functional mobility and reduced risk for falls.    Time 8    Period Weeks    Status New    Target Date 06/12/20                 Plan - 05/04/20 1430    Clinical Impression Statement Patient with steady progress toward therapy goals. Patient was able progress ambulating distance today, but with noted fatigue. Added NBOS with head turning today, on solid floor and on foam for improved dynamic balance. Patient was well challenged with this, noting increased fatigue. Patient continues to require several rest breaks during treatment for rest. Patient will continue to benefit from skilled therapy services to progress LE strength and functional tolerance to improve functional mobility and reduce risk for falls.    Personal Factors and Comorbidities Age;Comorbidity 3+    Comorbidities CVA, cervical stenosis, depression    Examination-Activity Limitations Bathing;Stand;Locomotion Level;Bed Mobility;Transfers;Stairs;Squat    Examination-Participation Restrictions Yard Work;Cleaning;Meal Prep;Community Activity    Stability/Clinical Decision Making Stable/Uncomplicated    Rehab Potential Good    PT Frequency 2x / week    PT Duration 8 weeks    PT Treatment/Interventions ADLs/Self Care Home Management;Aquatic Therapy;Cryotherapy;Electrical Stimulation;Moist Heat;DME Instruction;Gait training;Stair training;Functional mobility training;Therapeutic activities;Therapeutic exercise;Balance training;Neuromuscular re-education;Patient/family education;Orthotic Fit/Training;Manual techniques;Passive range of motion;Biofeedback;Vestibular;Spinal Manipulations;Visual/perceptual remediation/compensation;Dry needling;Energy conservation;Joint Manipulations;Splinting;Taping;Vasopneumatic Device    PT Next Visit Plan Progress LE strength, balance  and gait as tolerated. Increase ambulation distance as able.    PT Home Exercise Plan 04/15/20: sit/stand, heel raise 04/29/20: seated march, LAQs, seated heel/ toe raise    Consulted and Agree with Plan of Care  Patient           Patient will benefit from skilled therapeutic intervention in order to improve the following deficits and impairments:  Abnormal gait, Decreased knowledge of use of DME, Improper body mechanics, Decreased coordination, Decreased mobility, Postural dysfunction, Decreased activity tolerance, Decreased endurance, Decreased strength, Decreased balance, Difficulty walking  Visit Diagnosis: Muscle weakness (generalized)  Other abnormalities of gait and mobility  Other symptoms and signs involving the nervous system     Problem List Patient Active Problem List   Diagnosis Date Noted  . 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 Vitreomacular traction, left 04/08/2020  . Cystoid macular edema of left eye 04/08/2020  . 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 Acute right-sided weakness 01/13/2020  . Difficulty urinating 01/13/2020  . Stroke ([place])   . CVA (cerebral vascular accident) ([place]) 12/20/2019  . 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[place])   . PVC (premature ventricular contraction)   . Cervical stenosis of spinal canal   . Failure to thrive in adult 05/26/2017  . Failure to thrive (0-17) 05/25/2017  . Abdominal pulsatile mass 05/25/2017  . Anemia 05/25/2017  . Essential hypertension 05/25/2017  . Gait instability 05/20/2017  . Frequent falls 05/20/2017  . Depression 05/20/2017   2:31 PM, 05/04/20 Josue Hector PT DPT  Physical Therapist with [facility]  [phone] [place] [address] [place], [place], [address] Phone: [phone]   Fax:  [phone]  Name: Devin Valdez MRN: [id number] Date of Birth: 1941-01-13

## 2020-05-06 ENCOUNTER — Encounter (HOSPITAL_COMMUNITY): Payer: Self-pay | Admitting: Physical Therapy

## 2020-05-06 ENCOUNTER — Other Ambulatory Visit: Payer: Self-pay

## 2020-05-06 ENCOUNTER — Ambulatory Visit (HOSPITAL_COMMUNITY): Payer: Medicare Other | Admitting: Physical Therapy

## 2020-05-06 DIAGNOSIS — M6281 Muscle weakness (generalized): Secondary | ICD-10-CM

## 2020-05-06 DIAGNOSIS — R2689 Other abnormalities of gait and mobility: Secondary | ICD-10-CM | POA: Diagnosis not present

## 2020-05-06 DIAGNOSIS — R29818 Other symptoms and signs involving the nervous system: Secondary | ICD-10-CM

## 2020-05-06 NOTE — Therapy (Signed)
[place], [place], [address] Phone: [phone]   Fax:  [phone]  Physical Therapy Treatment  Patient Details  Name: Devin Valdez MRN: [id number] Date of Birth: Oct 29, 1941 Referring Provider (PT): Jenna Luo MD   Encounter Date: 05/06/2020   PT End of Session - 05/06/20 1353    Visit Number 7    Number of Visits 16    Date for PT Re-Evaluation 06/12/20    Authorization Type Medicare Part A; [place] secondary    Authorization - Visit Number 7    Authorization - Number of Visits 10    Progress Note Due on Visit 10    PT Start Time 4403    PT Stop Time 1430    PT Time Calculation (min) 41 min    Equipment Utilized During Treatment Gait belt    Activity Tolerance Patient limited by fatigue;Patient tolerated treatment well    Behavior During Therapy [facility] for tasks assessed/performed           Past Medical History:  Diagnosis Date  . Anemia   . Anxiety   . 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 Depression   . Falls   . Hallucinations   . Hyperlipidemia   . Hypertension   . Stroke [facility])    left paracentral pons infarct    Past Surgical History:  Procedure Laterality Date  . EYE SURGERY    . HERNIA REPAIR    . POSTERIOR CERVICAL LAMINECTOMY     with fusion C3-C7 10/19- Dr. Brigitte Pulse at [facility]    There were no vitals filed for this visit.   Subjective Assessment - 05/06/20 1352    Subjective Patient says his fatigue level is "not too good, about the same I guess". Reports no new falls. Says its hot today but otherwise he is fine.                             [place] Adult PT Treatment/Exercise - 05/06/20 0001      Knee/Hip Exercises: Standing   Heel Raises Both;2 sets;10 reps    Knee Flexion Both;2 sets;10 reps    Hip Abduction Both;2 sets;10 reps    Forward Step Up Both;1 set;10 reps;Hand Hold: 2;Step Height: 4"    Gait Training 226 feet  with quad cane     Other Standing Knee Exercises tandem stance 2X30" each, NBOS with head turns on foam 2x10, alternating marches on foam pad x 20     Other Standing Knee Exercises sidestepping 15 feet 2RT with min gaurd      Knee/Hip Exercises: Seated   Sit to Sand 10 reps;with UE support                    PT Short Term Goals - 04/13/20 1632      PT SHORT TERM GOAL #1   Title Patient will be independent with initial HEP and self-management strategies to improve functional outcomes    Time 4    Period Weeks    Status New    Target Date 05/15/20      PT SHORT TERM GOAL #2   Title Patient will be able to perform stand x 5 in < 30 seconds to demonstrate improvement in functional mobility and reduced risk for falls.    Time 4    Period Weeks  Status New    Target Date 05/15/20      PT SHORT TERM GOAL #3   Title Patient will be able to ambulate at least 225 feet during 2MWT with LRAD to demonstrate improved ability to perform functional mobility and associated tasks.    Time 4    Period Weeks    Status New    Target Date 05/15/20             PT Long Term Goals - 04/13/20 1633      PT LONG TERM GOAL #1   Title Patient will be able to perform stand x 5 in < 15 seconds to demonstrate improvement in functional mobility and reduced risk for falls.    Time 8    Period Weeks    Status New    Target Date 06/12/20      PT LONG TERM GOAL #2   Title Patient will be able to maintain tandem stance >20 seconds on BLEs to improve stability and reduce risk for falls    Time 8    Period Weeks    Status New    Target Date 06/12/20      PT LONG TERM GOAL #3   Title Patient will be able to ambulate at least 350 feet during 2MWT with LRAD to demonstrate improved ability to perform functional mobility and associated tasks.    Time 8    Period Weeks    Status New    Target Date 06/12/20      PT LONG TERM GOAL #4   Title Patient will report at least 75% overall  improvement in subjective complaint to indicate improvement in ability to perform ADLs.    Time 8    Period Weeks    Status New    Target Date 06/12/20      PT LONG TERM GOAL #5   Title Patient will improve DGI score by 3 points to demonstrate improvement in functional mobility and reduced risk for falls.    Time 8    Period Weeks    Status New    Target Date 06/12/20                 Plan - 05/06/20 1426    Clinical Impression Statement Patient shows some improved tolerance to activity today, though continues to reports increased fatigue. Patient required slightly less rest breaks at todays session. Patient showing good improvement with static tandem standing, but continues to have difficulty stabilizing on compliant surfaces. Patient was well challenged with added marching on foam pad. Patient will continue to benefit from skilled therapy services to progress functional tolerance and balance to improve functional mobility and reduce risk for future falls.    Personal Factors and Comorbidities Age;Comorbidity 3+    Comorbidities CVA, cervical stenosis, depression    Examination-Activity Limitations Bathing;Stand;Locomotion Level;Bed Mobility;Transfers;Stairs;Squat    Examination-Participation Restrictions Yard Work;Cleaning;Meal Prep;Community Activity    Stability/Clinical Decision Making Stable/Uncomplicated    Rehab Potential Good    PT Frequency 2x / week    PT Duration 8 weeks    PT Treatment/Interventions ADLs/Self Care Home Management;Aquatic Therapy;Cryotherapy;Electrical Stimulation;Moist Heat;DME Instruction;Gait training;Stair training;Functional mobility training;Therapeutic activities;Therapeutic exercise;Balance training;Neuromuscular re-education;Patient/family education;Orthotic Fit/Training;Manual techniques;Passive range of motion;Biofeedback;Vestibular;Spinal Manipulations;Visual/perceptual remediation/compensation;Dry needling;Energy conservation;Joint  Manipulations;Splinting;Taping;Vasopneumatic Device    PT Next Visit Plan Progress LE strength, balance and gait as tolerated. Conitnue to progress gait. Add dynamic balance activity next visit (tandem gait)    PT Home Exercise Plan 04/15/20: sit/stand, heel raise 04/29/20:  seated march, LAQs, seated heel/ toe raise    Consulted and Agree with Plan of Care Patient           Patient will benefit from skilled therapeutic intervention in order to improve the following deficits and impairments:  Abnormal gait, Decreased knowledge of use of DME, Improper body mechanics, Decreased coordination, Decreased mobility, Postural dysfunction, Decreased activity tolerance, Decreased endurance, Decreased strength, Decreased balance, Difficulty walking  Visit Diagnosis: Muscle weakness (generalized)  Other abnormalities of gait and mobility  Other symptoms and signs involving the nervous system     Problem List Patient Active Problem List   Diagnosis Date Noted  . 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 Vitreomacular traction, left 04/08/2020  . Cystoid macular edema of left eye 04/08/2020  . 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[place]) 01/13/2020  . Acute right-sided weakness 01/13/2020  . Difficulty urinating 01/13/2020  . Stroke ([place])   . CVA (cerebral vascular accident) ([place]) 12/20/2019  . 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[place])   . PVC (premature ventricular contraction)   . Cervical stenosis of spinal canal   . Failure to thrive in adult 05/26/2017  . Failure to thrive (0-17) 05/25/2017  . Abdominal pulsatile mass 05/25/2017  . Anemia 05/25/2017  . Essential hypertension 05/25/2017  . Gait instability 05/20/2017  . Frequent falls 05/20/2017  . Depression 05/20/2017   2:31 PM, 05/06/20 Josue Hector PT DPT  Physical Therapist with [facility]  [phone] [place], [place], [address] Phone: [phone]   Fax:  [phone]  Name: LORA GLOMSKI MRN: [id number] Date of Birth: [birth year]

## 2020-05-11 ENCOUNTER — Other Ambulatory Visit: Payer: Self-pay

## 2020-05-11 ENCOUNTER — Ambulatory Visit (HOSPITAL_COMMUNITY): Payer: Medicare Other | Admitting: Physical Therapy

## 2020-05-11 ENCOUNTER — Encounter (HOSPITAL_COMMUNITY): Payer: Self-pay | Admitting: Physical Therapy

## 2020-05-11 DIAGNOSIS — R2689 Other abnormalities of gait and mobility: Secondary | ICD-10-CM | POA: Diagnosis not present

## 2020-05-11 DIAGNOSIS — R29818 Other symptoms and signs involving the nervous system: Secondary | ICD-10-CM | POA: Diagnosis not present

## 2020-05-11 DIAGNOSIS — M6281 Muscle weakness (generalized): Secondary | ICD-10-CM

## 2020-05-11 NOTE — Therapy (Signed)
[place], [place], [address] Phone: [phone]   Fax:  [phone]  Physical Therapy Treatment  Patient Details  Name: Devin Valdez MRN: [id number] Date of Birth: 05/07/1941 Referring Provider (PT): Jenna Luo MD   Encounter Date: 05/11/2020   PT End of Session - 05/11/20 1353    Visit Number 8    Number of Visits 16    Date for PT Re-Evaluation 06/12/20    Authorization Type Medicare Part A; [place] secondary    Authorization - Visit Number 8    Authorization - Number of Visits 10    Progress Note Due on Visit 10    PT Start Time 4034    PT Stop Time 1435    PT Time Calculation (min) 47 min    Equipment Utilized During Treatment Gait belt    Activity Tolerance Patient limited by fatigue;Patient tolerated treatment well    Behavior During Therapy [facility] for tasks assessed/performed           Past Medical History:  Diagnosis Date  . Anemia   . Anxiety   . 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 Depression   . Falls   . Hallucinations   . Hyperlipidemia   . Hypertension   . Stroke [facility])    left paracentral pons infarct    Past Surgical History:  Procedure Laterality Date  . EYE SURGERY    . HERNIA REPAIR    . POSTERIOR CERVICAL LAMINECTOMY     with fusion C3-C7 10/19- Dr. Brigitte Pulse at [facility]    There were no vitals filed for this visit.   Subjective Assessment - 05/11/20 1352    Subjective Patient says he is doing alright today. Says he is walking fine, still with some trouble "in the front of the legs". No pain currently.    Currently in Pain? No/denies                             [facility] Adult PT Treatment/Exercise - 05/11/20 0001      Knee/Hip Exercises: Standing   Heel Raises Both;2 sets;10 reps    Knee Flexion Both;2 sets;10 reps    Hip Abduction Both;2 sets;10 reps    Forward Step Up Both;2 sets;10 reps;Hand Hold: 2;Step Height:  4"    Gait Training 200 feet with quad cane     Other Standing Knee Exercises --      Knee/Hip Exercises: Seated   Sit to Sand 2 sets;10 reps;with UE support               Balance Exercises - 05/11/20 0001      Balance Exercises: Standing   Tandem Stance Foam/compliant surface;2 reps;30 secs    Tandem Gait Forward;3 reps    Sidestepping 2 reps               PT Short Term Goals - 04/13/20 1632      PT SHORT TERM GOAL #1   Title Patient will be independent with initial HEP and self-management strategies to improve functional outcomes    Time 4    Period Weeks    Status New    Target Date 05/15/20      PT SHORT TERM GOAL #2   Title Patient will be able to perform stand x 5 in < 30 seconds to demonstrate improvement in functional mobility and  reduced risk for falls.    Time 4    Period Weeks    Status New    Target Date 05/15/20      PT SHORT TERM GOAL #3   Title Patient will be able to ambulate at least 225 feet during 2MWT with LRAD to demonstrate improved ability to perform functional mobility and associated tasks.    Time 4    Period Weeks    Status New    Target Date 05/15/20             PT Long Term Goals - 04/13/20 1633      PT LONG TERM GOAL #1   Title Patient will be able to perform stand x 5 in < 15 seconds to demonstrate improvement in functional mobility and reduced risk for falls.    Time 8    Period Weeks    Status New    Target Date 06/12/20      PT LONG TERM GOAL #2   Title Patient will be able to maintain tandem stance >20 seconds on BLEs to improve stability and reduce risk for falls    Time 8    Period Weeks    Status New    Target Date 06/12/20      PT LONG TERM GOAL #3   Title Patient will be able to ambulate at least 350 feet during 2MWT with LRAD to demonstrate improved ability to perform functional mobility and associated tasks.    Time 8    Period Weeks    Status New    Target Date 06/12/20      PT LONG TERM GOAL #4    Title Patient will report at least 75% overall improvement in subjective complaint to indicate improvement in ability to perform ADLs.    Time 8    Period Weeks    Status New    Target Date 06/12/20      PT LONG TERM GOAL #5   Title Patient will improve DGI score by 3 points to demonstrate improvement in functional mobility and reduced risk for falls.    Time 8    Period Weeks    Status New    Target Date 06/12/20                 Plan - 05/11/20 1442    Clinical Impression Statement Patient shows continued improvement in overall tolerance to activity. Patient was able to increase reps of standing exercise with less frequent rest breaks., Patient shows good stability and was well challenged with added foam pad to tandem standing. Patient did well with dynamic balance with tandem gait, but showed slight instability with when turning/ pivoting to return to chair at end of session. Patient continues to require intermittent rest breaks due to increased fatigue, but improving overall.  Patient will continue to benefit from skilled therapy services to progress LE strength and balance to improve functional mobility and reduce risk for falls.    Personal Factors and Comorbidities Age;Comorbidity 3+    Comorbidities CVA, cervical stenosis, depression    Examination-Activity Limitations Bathing;Stand;Locomotion Level;Bed Mobility;Transfers;Stairs;Squat    Examination-Participation Restrictions Yard Work;Cleaning;Meal Prep;Community Activity    Stability/Clinical Decision Making Stable/Uncomplicated    Rehab Potential Good    PT Frequency 2x / week    PT Duration 8 weeks    PT Treatment/Interventions ADLs/Self Care Home Management;Aquatic Therapy;Cryotherapy;Electrical Stimulation;Moist Heat;DME Instruction;Gait training;Stair training;Functional mobility training;Therapeutic activities;Therapeutic exercise;Balance training;Neuromuscular re-education;Patient/family education;Orthotic  Fit/Training;Manual techniques;Passive range of motion;Biofeedback;Vestibular;Spinal Manipulations;Visual/perceptual  remediation/compensation;Dry needling;Energy conservation;Joint Manipulations;Splinting;Taping;Vasopneumatic Device    PT Next Visit Plan Progress LE strength, balance and gait as tolerated. Conitnue to progress gait. Continue dynamic balance activity next visit (gait with head turns)    PT Home Exercise Plan 04/15/20: sit/stand, heel raise 04/29/20: seated march, LAQs, seated heel/ toe raise    Consulted and Agree with Plan of Care Patient           Patient will benefit from skilled therapeutic intervention in order to improve the following deficits and impairments:  Abnormal gait, Decreased knowledge of use of DME, Improper body mechanics, Decreased coordination, Decreased mobility, Postural dysfunction, Decreased activity tolerance, Decreased endurance, Decreased strength, Decreased balance, Difficulty walking  Visit Diagnosis: Muscle weakness (generalized)  Other abnormalities of gait and mobility  Other symptoms and signs involving the nervous system     Problem List Patient Active Problem List   Diagnosis Date Noted  . 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 Vitreomacular traction, left 04/08/2020  . Cystoid macular edema of left eye 04/08/2020  . 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[place]) 01/13/2020  . Acute right-sided weakness 01/13/2020  . Difficulty urinating 01/13/2020  . Stroke ([place])   . CVA (cerebral vascular accident) ([place]) 12/20/2019  . 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[place])   . PVC (premature ventricular contraction)   . Cervical stenosis of spinal canal   . Failure to thrive in adult 05/26/2017  . Failure to thrive (0-17) 05/25/2017  . Abdominal pulsatile mass 05/25/2017  . Anemia 05/25/2017  . Essential hypertension 05/25/2017  . Gait instability 05/20/2017  . Frequent falls 05/20/2017  . Depression 05/20/2017    2:50 PM,  05/11/20 Josue Hector PT DPT  Physical Therapist with [facility]  [phone] [place] [address] [place], [place], [address] Phone: [phone]   Fax:  [phone]  Name: Devin Valdez MRN: [id number] Date of Birth: 12-10-1940

## 2020-05-13 ENCOUNTER — Encounter (INDEPENDENT_AMBULATORY_CARE_PROVIDER_SITE_OTHER): Payer: Self-pay | Admitting: Ophthalmology

## 2020-05-13 ENCOUNTER — Ambulatory Visit (INDEPENDENT_AMBULATORY_CARE_PROVIDER_SITE_OTHER): Payer: Medicare Other | Admitting: Ophthalmology

## 2020-05-13 ENCOUNTER — Other Ambulatory Visit: Payer: Self-pay

## 2020-05-13 DIAGNOSIS — H34812 Central retinal vein occlusion, left eye, with macular edema: Secondary | ICD-10-CM

## 2020-05-13 DIAGNOSIS — H43822 Vitreomacular adhesion, left eye: Secondary | ICD-10-CM | POA: Diagnosis not present

## 2020-05-13 DIAGNOSIS — H35352 Cystoid macular degeneration, left eye: Secondary | ICD-10-CM

## 2020-05-13 MED ORDER — BEVACIZUMAB CHEMO INJECTION 1.25MG/0.05ML SYRINGE FOR KALEIDOSCOPE
1.2500 mg | INTRAVITREAL | Status: AC | PRN
  Administered 2020-05-13: 1.25 mg via INTRAVITREAL

## 2020-05-13 NOTE — Progress Notes (Signed)
05/13/2020     CHIEF COMPLAINT Patient presents for Retina Follow Up   HISTORY OF PRESENT ILLNESS: Devin Valdez is a 79 y.o. male who presents to the clinic today for:   HPI    Retina Follow Up    Patient presents with  CRVO/BRVO.  In left eye.  Duration of 5 weeks.  Since onset it is stable.          Comments    5 WEEK follow up - OCT OU, Poss Avastin OS Patient denies change in vision and overall has no complaints.        Last edited by Gerda Diss on 05/13/2020  9:56 AM. (History)      Referring physician: [name], MD [id number] [facility] [address] [place],  [place] [address]  HISTORICAL INFORMATION:   Selected notes from the MEDICAL RECORD NUMBER    Lab Results  Component Value Date   HGBA1C 4.6 (L) 01/14/2020     CURRENT MEDICATIONS: No current outpatient medications on file. (Ophthalmic Drugs)   No current facility-administered medications for this visit. (Ophthalmic Drugs)   Current Outpatient Medications (Other)  Medication Sig  . acetaminophen (TYLENOL) 325 MG tablet Take 650 mg by mouth every 6 (six) hours as needed for mild pain or headache.  Marland Kitchen aspirin 81 MG chewable tablet Chew by mouth daily.  Marland Kitchen atorvastatin (LIPITOR) 40 MG tablet TAKE ONE TABLET (40MG  TOTAL) BY MOUTH DAILY AT 6PM  . Coenzyme Q10 (CO Q-10) 200 MG CAPS Take by mouth daily.  . ferrous sulfate 325 (65 FE) MG tablet Take 1 tablet (325 mg total) by mouth 2 (two) times daily with a meal. (Patient taking differently: Take 325 mg by mouth daily with breakfast. )  . losartan (COZAAR) 50 MG tablet TAKE ONE (1) TABLET BY MOUTH EVERY DAY FOR BLOOD PRESSURE (Patient taking differently: Take 50 mg by mouth daily. )  . Multiple Vitamins-Minerals (CENTRUM SILVER ADULT 50+) TABS Take 1 capsule by mouth 2 (two) times daily.  . sildenafil (VIAGRA) 100 MG tablet TAKE ONE TABLET (100MG  TOTAL) BY MOUTH DAILY AS NEEDED FOR ERECTILE DYSFUNCTION (Patient taking differently: Take 100 mg by mouth as  needed for erectile dysfunction. )   No current facility-administered medications for this visit. (Other)      REVIEW OF SYSTEMS:    ALLERGIES No Known Allergies  PAST MEDICAL HISTORY Past Medical History:  Diagnosis Date  . Anemia   . Anxiety   . 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 Depression   . Falls   . Hallucinations   . Hyperlipidemia   . Hypertension   . Stroke [facility])    left paracentral pons infarct   Past Surgical History:  Procedure Laterality Date  . EYE SURGERY    . HERNIA REPAIR    . POSTERIOR CERVICAL LAMINECTOMY     with fusion C3-C7 10/19- Dr. Brigitte Pulse at [place] Family History  Problem Relation Age of Onset  . Multiple sclerosis Son   . Bipolar disorder Son     SOCIAL HISTORY Social History   Tobacco Use  . Smoking status: Former Smoker    Quit date: 11/21/1985    Years since quitting: 34.4  . Smokeless tobacco: Never Used  Substance Use Topics  . Alcohol use: No  . Drug use: No         OPHTHALMIC EXAM:  Base Eye Exam    Visual  Acuity (Snellen - Linear)      Right Left   Dist cc 20/40-1 20/200+1   Dist ph cc NI NI       Tonometry (Tonopen, 10:04 AM)      Right Left   Pressure 11 11       Pupils      Pupils Dark Light Shape React APD   Right PERRL 3 3 Round Minimal None   Left PERRL 3 2 Round Brisk None       Visual Fields (Counting fingers)      Left Right    Full Full       Extraocular Movement      Right Left    Full Full       Neuro/Psych    Oriented x3: Yes   Mood/Affect: Normal       Dilation    Left eye: 1.0% Mydriacyl, 2.5% Phenylephrine @ 10:04 AM        Slit Lamp and Fundus Exam    External Exam      Right Left   External Normal Normal       Slit Lamp Exam      Right Left   Lids/Lashes Normal Normal   Conjunctiva/Sclera White and quiet White and quiet   Cornea Clear Clear   Anterior Chamber Deep and quiet Deep and quiet   Iris Round and reactive  Round and reactive   Lens Posterior chamber intraocular lens Posterior chamber intraocular lens   Anterior Vitreous Normal Normal       Fundus Exam      Right Left   Posterior Vitreous  Normal   Disc  Early collaterals   C/D Ratio 0.3 0.25   Macula  Macular thickening, Microaneurysms, Cystoid macular edema,  Intraretinal hemorrhage   Vessels  Hemiretinal vein occlusion superiorly.   Periphery  Normal          IMAGING AND PROCEDURES  Imaging and Procedures for 05/13/20  OCT, Retina - OU - Both Eyes       Right Eye Quality was good. Scan locations included subfoveal. Central Foveal Thickness: 284. Progression has been stable. Findings include normal observations.   Left Eye Quality was good. Scan locations included subfoveal. Central Foveal Thickness: 316. Progression has improved. Findings include cystoid macular edema, vitreomacular adhesion , abnormal foveal contour, subretinal hyper-reflective material.   Notes Vastly improved central retinal vein occlusion superiorly OS with CME,       Intravitreal Injection, Pharmacologic Agent - OS - Left Eye       Time Out 05/13/2020. 11:27 AM. Confirmed correct patient, procedure, site, and patient consented.   Anesthesia Topical anesthesia was used. Anesthetic medications included Akten 3.5%.   Procedure Preparation included Ofloxacin , 10% betadine to eyelids. A 30 gauge needle was used.   Injection:  1.25 mg Bevacizumab (AVASTIN) SOLN   NDC: [address]-0, Lot: [id number]   Route: Intravitreal, Site: Left Eye, Waste: 0 mg  Post-op Post injection exam found visual acuity of at least counting fingers. The patient tolerated the procedure well. There were no complications. The patient received written and verbal post procedure care education. Post injection medications were not given.                 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 The potential sequelae of ischemic central  retinal vein occlusion, including macular edema, neovascularization, rubeosis iridis, and neovascular glaucoma, were discussed, and the need for frequent follow-up.  The nature of macular edema and central retinal vein occlusion was discussed. The following options were considered:  1.Observation for a period to look for spontaneous improvement, is no linger the primary therapy. One-third worsen, one-third stay unchanged, and one-third improves.  2. Anti-VEGF Therapy. ( Lucentis, Avastin or Eylea ) injected  in intravitreal fashion, initially monthly then tailored to clinical response.  3. Intravitreal steroid usage, Kenalog, or Ozurdex, usually a second line therapy or in combination with anti-Vegf therapy noted above.  4. Panretinal laser photocoagulation to cause regression of iris neovascularization, or treat retinal  non-perfusion.  5. Surgical Management may include vitrectomy with incisions of peripheral veins to trigger retino choroidal anastomosis formation. This topic presented and discussed at [place].   Improved status post Avastin No. 1, repeat now and exam in 5 to 6 weeks      ICD-10-CM   1. 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 Cystoid macular edema of left eye  H35.352   3. Vitreomacular traction, left  H43.822     1.  Repeat intravitreal Avastin OS today and examination in 5 weeks  2.  3.  Ophthalmic Meds Ordered this visit:  Meds ordered this encounter  Medications  . Bevacizumab (AVASTIN) SOLN 1.25 mg       Return in about 5 weeks (around 06/17/2020) for OS, AVASTIN OCT.  There are no Patient Instructions on file for this visit.   Explained  the diagnoses, plan, and follow up with the patient and they expressed understanding.  Patient expressed understanding of the importance of proper follow up care.   Clent Demark Zenas Santa M.D. Diseases & Surgery of the Retina and Vitreous Retina & Diabetic [place] 05/13/20     Abbreviations: M myopia (nearsighted); A astigmatism; H hyperopia (farsighted); P presbyopia; Mrx spectacle prescription;  CTL contact lenses; OD right eye; OS left eye; OU both eyes  XT exotropia; ET esotropia; PEK punctate epithelial keratitis; PEE punctate epithelial erosions; DES dry eye syndrome; MGD meibomian gland dysfunction; ATs artificial tears; PFAT's preservative free artificial tears; Nolan nuclear sclerotic cataract; PSC posterior subcapsular cataract; ERM epi-retinal membrane; PVD posterior vitreous detachment; RD retinal detachment; DM diabetes mellitus; DR diabetic retinopathy; NPDR non-proliferative diabetic retinopathy; PDR proliferative diabetic retinopathy; CSME clinically significant macular edema; DME diabetic macular edema; dbh dot blot hemorrhages; CWS cotton wool spot; POAG primary open angle glaucoma; C/D cup-to-disc ratio; HVF humphrey visual field; GVF goldmann visual field; OCT optical coherence tomography; IOP intraocular pressure; BRVO Branch retinal vein occlusion; CRVO central retinal vein occlusion; CRAO central retinal artery occlusion; BRAO branch retinal artery occlusion; RT retinal tear; SB scleral buckle; PPV pars plana vitrectomy; VH Vitreous hemorrhage; PRP panretinal laser photocoagulation; IVK intravitreal kenalog; VMT vitreomacular traction; MH Macular hole;  NVD neovascularization of the disc; NVE neovascularization elsewhere; AREDS age related eye disease study; ARMD age related macular degeneration; POAG primary open angle glaucoma; EBMD epithelial/anterior basement membrane dystrophy; ACIOL anterior chamber intraocular lens; IOL intraocular lens; PCIOL posterior chamber intraocular lens;  Phaco/IOL phacoemulsification with intraocular lens placement; [place] photorefractive keratectomy; LASIK laser assisted in situ keratomileusis; HTN hypertension; DM diabetes mellitus; COPD chronic obstructive pulmonary disease

## 2020-05-13 NOTE — Assessment & Plan Note (Signed)
The nature of central retinal vein occlusion was discussed with the patient including the division of types into nonischemic ischemic. The potential sequelae of ischemic central retinal vein occlusion, including macular edema, neovascularization, rubeosis iridis, and neovascular glaucoma, were discussed, and the need for frequent follow-up.  The nature of macular edema and central retinal vein occlusion was discussed. The following options were considered:  1.Observation for a period to look for spontaneous improvement, is no linger the primary therapy. One-third worsen, one-third stay unchanged, and one-third improves.  2. Anti-VEGF Therapy. ( Lucentis, Avastin or Eylea ) injected  in intravitreal fashion, initially monthly then tailored to clinical response.  3. Intravitreal steroid usage, Kenalog, or Ozurdex, usually a second line therapy or in combination with anti-Vegf therapy noted above.  4. Panretinal laser photocoagulation to cause regression of iris neovascularization, or treat retinal  non-perfusion.  5. Surgical Management may include vitrectomy with incisions of peripheral veins to trigger retino choroidal anastomosis formation. This topic presented and discussed at [place].   Improved status post Avastin No. 1, repeat now and exam in 5 to 6 weeks

## 2020-05-14 ENCOUNTER — Encounter (HOSPITAL_COMMUNITY): Payer: Self-pay | Admitting: Physical Therapy

## 2020-05-14 ENCOUNTER — Ambulatory Visit (HOSPITAL_COMMUNITY): Payer: Medicare Other | Admitting: Physical Therapy

## 2020-05-14 DIAGNOSIS — M6281 Muscle weakness (generalized): Secondary | ICD-10-CM

## 2020-05-14 DIAGNOSIS — R2689 Other abnormalities of gait and mobility: Secondary | ICD-10-CM | POA: Diagnosis not present

## 2020-05-14 DIAGNOSIS — R29818 Other symptoms and signs involving the nervous system: Secondary | ICD-10-CM

## 2020-05-14 NOTE — Therapy (Signed)
[place], [place], [address] Phone: [phone]   Fax:  [phone]  Physical Therapy Treatment  Patient Details  Name: Devin Valdez MRN: [id number] Date of Birth: Apr 14, 1941 Referring Provider (PT): Jenna Luo MD   Encounter Date: 05/14/2020   PT End of Session - 05/14/20 1037    Visit Number 9    Number of Visits 16    Date for PT Re-Evaluation 06/12/20    Authorization Type Medicare Part A; [place] secondary    Authorization - Visit Number 9    Authorization - Number of Visits 10    Progress Note Due on Visit 10    PT Start Time 1033    PT Stop Time 1115    PT Time Calculation (min) 42 min    Equipment Utilized During Treatment Gait belt    Activity Tolerance Patient limited by fatigue;Patient tolerated treatment well    Behavior During Therapy [facility] for tasks assessed/performed           Past Medical History:  Diagnosis Date  . Anemia   . Anxiety   . 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 Depression   . Falls   . Hallucinations   . Hyperlipidemia   . Hypertension   . Stroke [facility])    left paracentral pons infarct    Past Surgical History:  Procedure Laterality Date  . EYE SURGERY    . HERNIA REPAIR    . POSTERIOR CERVICAL LAMINECTOMY     with fusion C3-C7 10/19- Dr. Brigitte [facility] at [facility]    There were no vitals filed for this visit.   Subjective Assessment - 05/14/20 1036    Subjective Patient reports no issues, says he is doing well.    Currently in Pain? No/denies                             [facility] Adult PT Treatment/Exercise - 05/14/20 0001      Knee/Hip Exercises: Standing   Heel Raises Both;2 sets;10 reps    Knee Flexion Both;2 sets;10 reps    Hip Abduction Both;2 sets;10 reps    Forward Step Up Both;2 sets;10 reps;Hand Hold: 2;Step Height: 4"    Gait Training 200 feet with no AD       Knee/Hip Exercises: Seated   Sit to  Sand 2 sets;10 reps;with UE support               Balance Exercises - 05/14/20 0001      Balance Exercises: Standing   Gait with Head Turns Forward;2 reps    Tandem Gait Forward;3 reps    Retro Gait 2 reps    Sidestepping 2 reps               PT Short Term Goals - 04/13/20 1632      PT SHORT TERM GOAL #1   Title Patient will be independent with initial HEP and self-management strategies to improve functional outcomes    Time 4    Period Weeks    Status New    Target Date 05/15/20      PT SHORT TERM GOAL #2   Title Patient will be able to perform stand x 5 in < 30 seconds to demonstrate improvement in functional mobility and reduced risk for falls.    Time 4    Period Weeks  Status New    Target Date 05/15/20      PT SHORT TERM GOAL #3   Title Patient will be able to ambulate at least 225 feet during 2MWT with LRAD to demonstrate improved ability to perform functional mobility and associated tasks.    Time 4    Period Weeks    Status New    Target Date 05/15/20             PT Long Term Goals - 04/13/20 1633      PT LONG TERM GOAL #1   Title Patient will be able to perform stand x 5 in < 15 seconds to demonstrate improvement in functional mobility and reduced risk for falls.    Time 8    Period Weeks    Status New    Target Date 06/12/20      PT LONG TERM GOAL #2   Title Patient will be able to maintain tandem stance >20 seconds on BLEs to improve stability and reduce risk for falls    Time 8    Period Weeks    Status New    Target Date 06/12/20      PT LONG TERM GOAL #3   Title Patient will be able to ambulate at least 350 feet during 2MWT with LRAD to demonstrate improved ability to perform functional mobility and associated tasks.    Time 8    Period Weeks    Status New    Target Date 06/12/20      PT LONG TERM GOAL #4   Title Patient will report at least 75% overall improvement in subjective complaint to indicate improvement in ability  to perform ADLs.    Time 8    Period Weeks    Status New    Target Date 06/12/20      PT LONG TERM GOAL #5   Title Patient will improve DGI score by 3 points to demonstrate improvement in functional mobility and reduced risk for falls.    Time 8    Period Weeks    Status New    Target Date 06/12/20                 Plan - 05/14/20 1153    Clinical Impression Statement Patient shows continued improvement in tolerance to activity. Patient able to perform more activity with less rest breaks today. Patient was well challenged with dynamic balance additions today. Patient noted slight increase is dizziness with retro walking but subsided with rest. Patient required verbal cues for proper sequencing with gait with head turns, was well challenged with this demonstrating significant reduction in gait speed. Patient able to progress to gait training with no AD today. Did well with this, no LOB. Patient will continue to benefit form skilled therapy services to progress LE strength and balance to improve functional mobility and reduce risk for falls.    Personal Factors and Comorbidities Age;Comorbidity 3+    Comorbidities CVA, cervical stenosis, depression    Examination-Activity Limitations Bathing;Stand;Locomotion Level;Bed Mobility;Transfers;Stairs;Squat    Examination-Participation Restrictions Yard Work;Cleaning;Meal Prep;Community Activity    Stability/Clinical Decision Making Stable/Uncomplicated    Rehab Potential Good    PT Frequency 2x / week    PT Duration 8 weeks    PT Treatment/Interventions ADLs/Self Care Home Management;Aquatic Therapy;Cryotherapy;Electrical Stimulation;Moist Heat;DME Instruction;Gait training;Stair training;Functional mobility training;Therapeutic activities;Therapeutic exercise;Balance training;Neuromuscular re-education;Patient/family education;Orthotic Fit/Training;Manual techniques;Passive range of motion;Biofeedback;Vestibular;Spinal  Manipulations;Visual/perceptual remediation/compensation;Dry needling;Energy conservation;Joint Manipulations;Splinting;Taping;Vasopneumatic Device    PT Next Visit Plan 10th visit  progress note next visit    PT Home Exercise Plan 04/15/20: sit/stand, heel raise 04/29/20: seated march, LAQs, seated heel/ toe raise    Consulted and Agree with Plan of Care Patient           Patient will benefit from skilled therapeutic intervention in order to improve the following deficits and impairments:  Abnormal gait, Decreased knowledge of use of DME, Improper body mechanics, Decreased coordination, Decreased mobility, Postural dysfunction, Decreased activity tolerance, Decreased endurance, Decreased strength, Decreased balance, Difficulty walking  Visit Diagnosis: Muscle weakness (generalized)  Other abnormalities of gait and mobility  Other symptoms and signs involving the nervous system     Problem List Patient Active Problem List   Diagnosis Date Noted  . 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 Vitreomacular traction, left 04/08/2020  . Cystoid macular edema of left eye 04/08/2020  . 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 Acute right-sided weakness 01/13/2020  . Difficulty urinating 01/13/2020  . Stroke ([place])   . CVA (cerebral vascular accident) ([place]) 12/20/2019  . 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[place])   . PVC (premature ventricular contraction)   . Cervical stenosis of spinal canal   . Failure to thrive in adult 05/26/2017  . Failure to thrive (0-17) 05/25/2017  . Abdominal pulsatile mass 05/25/2017  . Anemia 05/25/2017  . Essential hypertension 05/25/2017  . Gait instability 05/20/2017  . Frequent falls 05/20/2017  . Depression 05/20/2017    11:55 AM, 05/14/20 Josue Hector PT DPT  Physical Therapist with [facility]  [phone] [place], [place], [address] Phone: [phone]   Fax:  [phone]  Name: Devin Valdez MRN: [id number] Date of Birth: [birth year]

## 2020-05-18 ENCOUNTER — Other Ambulatory Visit: Payer: Self-pay

## 2020-05-18 ENCOUNTER — Encounter (HOSPITAL_COMMUNITY): Payer: Self-pay | Admitting: Physical Therapy

## 2020-05-18 ENCOUNTER — Ambulatory Visit (HOSPITAL_COMMUNITY): Payer: Medicare Other | Admitting: Physical Therapy

## 2020-05-18 DIAGNOSIS — R2689 Other abnormalities of gait and mobility: Secondary | ICD-10-CM

## 2020-05-18 DIAGNOSIS — M6281 Muscle weakness (generalized): Secondary | ICD-10-CM

## 2020-05-18 DIAGNOSIS — R29818 Other symptoms and signs involving the nervous system: Secondary | ICD-10-CM

## 2020-05-18 NOTE — Therapy (Signed)
[place], [place], [address] Phone: [phone]   Fax:  [phone]  Physical Therapy Treatment/ Progress Note   Patient Details  Name: Devin Valdez MRN: [id number] Date of Birth: 1941-11-07 Referring Provider (PT): Jenna Luo MD   Encounter Date: 05/18/2020  Progress Note Reporting Period 04/13/20 to 05/18/20  See note below for Objective Data and Assessment of Progress/Goals.       PT End of Session - 05/18/20 1441    Visit Number 10    Number of Visits 16    Date for PT Re-Evaluation 06/12/20    Authorization Type Medicare Part A; [place] secondary    Authorization - Visit Number 10    Authorization - Number of Visits 20    Progress Note Due on Visit 16    PT Start Time 1432    PT Stop Time 1515    PT Time Calculation (min) 43 min    Equipment Utilized During Treatment Gait belt    Activity Tolerance Patient limited by fatigue;Patient tolerated treatment well    Behavior During Therapy WFL for tasks assessed/performed           Past Medical History:  Diagnosis Date  . Anemia   . Anxiety   . 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 Depression   . Falls   . Hallucinations   . Hyperlipidemia   . Hypertension   . Stroke [facility])    left paracentral pons infarct    Past Surgical History:  Procedure Laterality Date  . EYE SURGERY    . HERNIA REPAIR    . POSTERIOR CERVICAL LAMINECTOMY     with fusion C3-C7 10/19- Dr. Brigitte Pulse at [facility]    There were no vitals filed for this visit.   Subjective Assessment - 05/18/20 1436    Subjective Patient reports no new issues over the weekend. Says he did not do much. Says he feels his balance is improving, but still notes issues with fatigue. Says he gets tired when he goes up his steps at home. Patient says he feels he is about 85% improved since starting therapy.    How long can you stand comfortably? maybe 5-7  minutes while showering    How long can you walk comfortably? 5 minutes    Patient Stated Goals Get back to where I was with balance and walking    Currently in Pain? No/denies              [facility] PT Assessment - 05/18/20 0001      Assessment   Medical Diagnosis Weakness     Referring Provider (PT) Jenna Luo MD    Onset Date/Surgical Date 12/20/19    Prior Therapy Yes       Precautions   Precautions Fall      Restrictions   Weight Bearing Restrictions No      Balance Screen   Has the patient fallen in the past 6 months No    Has the patient had a decrease in activity level because of a fear of falling?  Yes    Is the patient reluctant to leave their home because of a fear of falling?  Yes      [place] Private residence    Living Arrangements Spouse/significant other    Available Help at Discharge Family    Type of [place]  Access Level entry    Home Layout Multi-level      Prior Function   Level of Independence Independent with basic ADLs      Cognition   Overall Cognitive Status Impaired/Different from baseline      Transfers   Five time sit to stand comments  17.6 sec with minimal use of UEs    Prior: 3 reps in 17 sec with no UEs     Ambulation/Gait   Ambulation/Gait Yes    Ambulation/Gait Assistance 6: Modified independent (Device/Increase time)    Ambulation Distance (Feet) 275 Feet    Assistive device Small based quad cane    Gait Pattern Poor foot clearance - right;Decreased step length - right;Decreased step length - left;Decreased stride length    Ambulation Surface Level;Indoor    Gait Comments 2MWT      Static Standing Balance   Static Standing Balance -  Activities  Tandam Stance - Right Leg;Tandam Stance - Left Leg    Static Standing - Comment/# of Minutes 30 sec in semi tandem, 30 sec       Dynamic Gait Index   Level Surface Normal    Change in Gait Speed Normal    Gait with Horizontal Head Turns  Mild Impairment    Gait with Vertical Head Turns Normal    Gait and Pivot Turn Normal    Step Over Obstacle Normal    Step Around Obstacles Normal    Steps Mild Impairment    Total Score 22                         [facility] Adult PT Treatment/Exercise - 05/18/20 0001      Knee/Hip Exercises: Seated   Sit to Sand 2 sets;5 reps;with UE support                  PT Education - 05/18/20 1559    Education Details on reassessment findings and POC    Person(s) Educated Patient    Methods Explanation    Comprehension Verbalized understanding            PT Short Term Goals - 05/18/20 1511      PT SHORT TERM GOAL #1   Title Patient will be independent with initial HEP and self-management strategies to improve functional outcomes    Baseline Reports compliance    Time 4    Period Weeks    Status Achieved    Target Date 05/15/20      PT SHORT TERM GOAL #2   Title Patient will be able to perform stand x 5 in < 30 seconds to demonstrate improvement in functional mobility and reduced risk for falls.    Baseline Current: 17.6 sec with UEs    Time 4    Period Weeks    Status Achieved    Target Date 05/15/20      PT SHORT TERM GOAL #3   Title Patient will be able to ambulate at least 225 feet during 2MWT with LRAD to demonstrate improved ability to perform functional mobility and associated tasks.    Baseline Current: 275 with quad cane    Time 4    Period Weeks    Status Achieved    Target Date 05/15/20             PT Long Term Goals - 05/18/20 1512      PT LONG TERM GOAL #1   Title Patient will be able  to perform stand x 5 in < 15 seconds to demonstrate improvement in functional mobility and reduced risk for falls.    Baseline Current: 17.6 sec with UEs    Time 8    Period Weeks    Status On-going      PT LONG TERM GOAL #2   Title Patient will be able to maintain tandem stance >20 seconds on BLEs to improve stability and reduce risk for falls     Baseline Able to hold 30 sec semi tandem with RT foot forward, can do 30 sec tendem with LT foot forward    Time 8    Period Weeks    Status Partially Met      PT LONG TERM GOAL #3   Title Patient will be able to ambulate at least 350 feet during 2MWT with LRAD to demonstrate improved ability to perform functional mobility and associated tasks.    Baseline Current: 275 with quad cane    Time 8    Period Weeks    Status On-going      PT LONG TERM GOAL #4   Title Patient will report at least 75% overall improvement in subjective complaint to indicate improvement in ability to perform ADLs.    Baseline Reports 85%    Time 8    Period Weeks    Status Achieved      PT LONG TERM GOAL #5   Title Patient will improve DGI score by 3 points to demonstrate improvement in functional mobility and reduced risk for falls.    Baseline improved 6 points    Time 8    Period Weeks    Status Achieved                 Plan - 05/18/20 1522    Clinical Impression Statement Patetin is making good progress toward therapy goals. Patient currently with 3/3 short term and 3 /5 long term goals met/ partially met. Patient shows improvement in static and dynamic balance but continues to be limited by weakness and fatigue/ decreased tolerance to functional activity which continues to negatively impact functional ability. Patient will continue to benefit from skilled therapy services to address remaining deficits to improve functional mobility and reduce risk for future falls.    Personal Factors and Comorbidities Age;Comorbidity 3+    Comorbidities CVA, cervical stenosis, depression    Examination-Activity Limitations Bathing;Stand;Locomotion Level;Bed Mobility;Transfers;Stairs;Squat    Examination-Participation Restrictions Yard Work;Cleaning;Meal Prep;Community Activity    Stability/Clinical Decision Making Stable/Uncomplicated    Rehab Potential Good    PT Frequency 2x / week    PT Duration 8 weeks     PT Treatment/Interventions ADLs/Self Care Home Management;Aquatic Therapy;Cryotherapy;Electrical Stimulation;Moist Heat;DME Instruction;Gait training;Stair training;Functional mobility training;Therapeutic activities;Therapeutic exercise;Balance training;Neuromuscular re-education;Patient/family education;Orthotic Fit/Training;Manual techniques;Passive range of motion;Biofeedback;Vestibular;Spinal Manipulations;Visual/perceptual remediation/compensation;Dry needling;Energy conservation;Joint Manipulations;Splinting;Taping;Vasopneumatic Device    PT Next Visit Plan Continue to progress LE strength and functional tolerance as able. Continue gait and stair training.    PT Home Exercise Plan 04/15/20: sit/stand, heel raise 04/29/20: seated march, LAQs, seated heel/ toe raise    Consulted and Agree with Plan of Care Patient           Patient will benefit from skilled therapeutic intervention in order to improve the following deficits and impairments:  Abnormal gait, Decreased knowledge of use of DME, Improper body mechanics, Decreased coordination, Decreased mobility, Postural dysfunction, Decreased activity tolerance, Decreased endurance, Decreased strength, Decreased balance, Difficulty walking  Visit Diagnosis: Muscle weakness (generalized)  Other abnormalities  of gait and mobility  Other symptoms and signs involving the nervous system     Problem List Patient Active Problem List   Diagnosis Date Noted  . 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 Vitreomacular traction, left 04/08/2020  . Cystoid macular edema of left eye 04/08/2020  . 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 Acute right-sided weakness 01/13/2020  . Difficulty urinating 01/13/2020  . Stroke ([place])   . CVA (cerebral vascular accident) ([place]) 12/20/2019  . 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[place])   . PVC (premature ventricular contraction)   . Cervical stenosis of spinal canal   . Failure to  thrive in adult 05/26/2017  . Failure to thrive (0-17) 05/25/2017  . Abdominal pulsatile mass 05/25/2017  . Anemia 05/25/2017  . Essential hypertension 05/25/2017  . Gait instability 05/20/2017  . Frequent falls 05/20/2017  . Depression 05/20/2017    4:04 PM, 05/18/20 Josue Hector PT DPT  Physical Therapist with [facility]  [phone] [place] [address] [place], [place], [address] Phone: [phone]   Fax:  [phone]  Name: DIYAN DAVE MRN: [id number] Date of Birth: [birth year]

## 2020-05-20 ENCOUNTER — Other Ambulatory Visit: Payer: Self-pay

## 2020-05-20 ENCOUNTER — Encounter (HOSPITAL_COMMUNITY): Payer: Self-pay | Admitting: Physical Therapy

## 2020-05-20 ENCOUNTER — Ambulatory Visit (HOSPITAL_COMMUNITY): Payer: Medicare Other | Admitting: Physical Therapy

## 2020-05-20 DIAGNOSIS — R2689 Other abnormalities of gait and mobility: Secondary | ICD-10-CM | POA: Diagnosis not present

## 2020-05-20 DIAGNOSIS — M6281 Muscle weakness (generalized): Secondary | ICD-10-CM

## 2020-05-20 DIAGNOSIS — R29818 Other symptoms and signs involving the nervous system: Secondary | ICD-10-CM | POA: Diagnosis not present

## 2020-05-20 NOTE — Therapy (Signed)
[facility] [address] [place], [place], [address] Phone: [phone]   Fax:  [phone]  Physical Therapy Treatment  Patient Details  Name: Devin Valdez MRN: [id number] Date of Birth: [birth year] Referring Provider (PT): Lynnea Ferrier MD   Encounter Date: 05/20/2020   PT End of Session - 05/20/20 1301    Visit Number 11    Number of Visits 16    Date for PT Re-Evaluation 06/12/20    Authorization Type Medicare Part A; [employer] secondary    Authorization - Visit Number 11    Authorization - Number of Visits 20    Progress Note Due on Visit 16    PT Start Time 1303    PT Stop Time 1345    PT Time Calculation (min) 42 min    Equipment Utilized During Treatment Gait belt    Activity Tolerance Patient limited by fatigue;Patient tolerated treatment well    Behavior During Therapy [facility] for tasks assessed/performed           Past Medical History:  Diagnosis Date  . Anemia   . Anxiety   . 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 Depression   . Falls   . Hallucinations   . Hyperlipidemia   . Hypertension   . Stroke [facility])    left paracentral pons infarct    Past Surgical History:  Procedure Laterality Date  . EYE SURGERY    . HERNIA REPAIR    . POSTERIOR CERVICAL LAMINECTOMY     with fusion C3-C7 10/19- Dr. Leeanne Rio at [facility]    There were no vitals filed for this visit.   Subjective Assessment - 05/20/20 1306    Subjective Patient says he was a little sore and fatigued after last visit. Says he did a little exercise at home this morning to get ready for session today.    How long can you stand comfortably? maybe 5-7 minutes while showering    How long can you walk comfortably? 5 minutes    Patient Stated Goals Get back to where I was with balance and walking    Currently in Pain? No/denies                             [facility] Adult PT Treatment/Exercise - 05/20/20 0001       Knee/Hip Exercises: Standing   Heel Raises Both;2 sets;10 reps    Knee Flexion Both;2 sets;10 reps    Knee Flexion Limitations 1#    Hip Abduction Both;2 sets;10 reps    Abduction Limitations 1#    Hip Extension Both;2 sets;10 reps    Extension Limitations 1#    Lateral Step Up Both;1 set;10 reps;Hand Hold: 2;Step Height: 6"    Forward Step Up Both;1 set;10 reps;Hand Hold: 2;Step Height: 6"      Knee/Hip Exercises: Seated   Sit to Sand 2 sets;10 reps;without UE support               Balance Exercises - 05/20/20 0001      Balance Exercises: Standing   Gait with Head Turns Forward;2 reps    Tandem Gait Forward;2 reps    Retro Gait 2 reps    Sidestepping 2 reps    Other Standing Exercises tandem gait on foam beam 2 RT               PT Short Term  Goals - 05/18/20 1511      PT SHORT TERM GOAL #1   Title Patient will be independent with initial HEP and self-management strategies to improve functional outcomes    Baseline Reports compliance    Time 4    Period Weeks    Status Achieved    Target Date 05/15/20      PT SHORT TERM GOAL #2   Title Patient will be able to perform stand x 5 in < 30 seconds to demonstrate improvement in functional mobility and reduced risk for falls.    Baseline Current: 17.6 sec with UEs    Time 4    Period Weeks    Status Achieved    Target Date 05/15/20      PT SHORT TERM GOAL #3   Title Patient will be able to ambulate at least 225 feet during 2MWT with LRAD to demonstrate improved ability to perform functional mobility and associated tasks.    Baseline Current: 275 with quad cane    Time 4    Period Weeks    Status Achieved    Target Date 05/15/20             PT Long Term Goals - 05/18/20 1512      PT LONG TERM GOAL #1   Title Patient will be able to perform stand x 5 in < 15 seconds to demonstrate improvement in functional mobility and reduced risk for falls.    Baseline Current: 17.6 sec with UEs    Time 8     Period Weeks    Status On-going      PT LONG TERM GOAL #2   Title Patient will be able to maintain tandem stance >20 seconds on BLEs to improve stability and reduce risk for falls    Baseline Able to hold 30 sec semi tandem with RT foot forward, can do 30 sec tendem with LT foot forward    Time 8    Period Weeks    Status Partially Met      PT LONG TERM GOAL #3   Title Patient will be able to ambulate at least 350 feet during 2MWT with LRAD to demonstrate improved ability to perform functional mobility and associated tasks.    Baseline Current: 275 with quad cane    Time 8    Period Weeks    Status On-going      PT LONG TERM GOAL #4   Title Patient will report at least 75% overall improvement in subjective complaint to indicate improvement in ability to perform ADLs.    Baseline Reports 85%    Time 8    Period Weeks    Status Achieved      PT LONG TERM GOAL #5   Title Patient will improve DGI score by 3 points to demonstrate improvement in functional mobility and reduced risk for falls.    Baseline improved 6 points    Time 8    Period Weeks    Status Achieved                 Plan - 05/20/20 1341    Clinical Impression Statement Patient demos improving dynamic balance. Had some difficulty with retro walking but was able to self-correct. Patient well challenged with added weights to standing hip exercise, noting increased muscle fatigue. Progressed the tandem gait on foam beam, patient well challenged with this. Patient will continue to benefit form skilled therapy services to progress LE strength and balance to  reduce risk for falls.    Personal Factors and Comorbidities Age;Comorbidity 3+    Comorbidities CVA, cervical stenosis, depression    Examination-Activity Limitations Bathing;Stand;Locomotion Level;Bed Mobility;Transfers;Stairs;Squat    Examination-Participation Restrictions Yard Work;Cleaning;Meal Prep;Community Activity    Stability/Clinical Decision Making  Stable/Uncomplicated    Rehab Potential Good    PT Frequency 2x / week    PT Duration 8 weeks    PT Treatment/Interventions ADLs/Self Care Home Management;Aquatic Therapy;Cryotherapy;Electrical Stimulation;Moist Heat;DME Instruction;Gait training;Stair training;Functional mobility training;Therapeutic activities;Therapeutic exercise;Balance training;Neuromuscular re-education;Patient/family education;Orthotic Fit/Training;Manual techniques;Passive range of motion;Biofeedback;Vestibular;Spinal Manipulations;Visual/perceptual remediation/compensation;Dry needling;Energy conservation;Joint Manipulations;Splinting;Taping;Vasopneumatic Device    PT Next Visit Plan Continue to progress LE strength and functional tolerance as able. Continue gait and stair training.    PT Home Exercise Plan 04/15/20: sit/stand, heel raise 04/29/20: seated march, LAQs, seated heel/ toe raise    Consulted and Agree with Plan of Care Patient           Patient will benefit from skilled therapeutic intervention in order to improve the following deficits and impairments:  Abnormal gait, Decreased knowledge of use of DME, Improper body mechanics, Decreased coordination, Decreased mobility, Postural dysfunction, Decreased activity tolerance, Decreased endurance, Decreased strength, Decreased balance, Difficulty walking  Visit Diagnosis: Muscle weakness (generalized)  Other abnormalities of gait and mobility  Other symptoms and signs involving the nervous system     Problem List Patient Active Problem List   Diagnosis Date Noted  . 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 Vitreomacular traction, left 04/08/2020  . Cystoid macular edema of left eye 04/08/2020  . 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 Acute right-sided weakness 01/13/2020  . Difficulty urinating 01/13/2020  . Stroke ([place])   . CVA (cerebral vascular accident) ([place]) 12/20/2019  . 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[place])    . PVC (premature ventricular contraction)   . Cervical stenosis of spinal canal   . Failure to thrive in adult 05/26/2017  . Failure to thrive (0-17) 05/25/2017  . Abdominal pulsatile mass 05/25/2017  . Anemia 05/25/2017  . Essential hypertension 05/25/2017  . Gait instability 05/20/2017  . Frequent falls 05/20/2017  . Depression 05/20/2017    1:49 PM, 05/20/20 Josue Hector PT DPT  Physical Therapist with [facility]  [phone] [place] [address] [place], [place], [address] Phone: [phone]   Fax:  [phone]  Name: Devin Valdez MRN: [id number] Date of Birth: [birth year]

## 2020-05-26 ENCOUNTER — Encounter (HOSPITAL_COMMUNITY): Payer: Self-pay | Admitting: Physical Therapy

## 2020-05-26 ENCOUNTER — Other Ambulatory Visit: Payer: Self-pay

## 2020-05-26 ENCOUNTER — Ambulatory Visit (HOSPITAL_COMMUNITY): Payer: Medicare Other | Attending: Family Medicine | Admitting: Physical Therapy

## 2020-05-26 DIAGNOSIS — R278 Other lack of coordination: Secondary | ICD-10-CM | POA: Diagnosis not present

## 2020-05-26 DIAGNOSIS — M6281 Muscle weakness (generalized): Secondary | ICD-10-CM | POA: Diagnosis not present

## 2020-05-26 DIAGNOSIS — R2689 Other abnormalities of gait and mobility: Secondary | ICD-10-CM | POA: Diagnosis not present

## 2020-05-26 DIAGNOSIS — R29818 Other symptoms and signs involving the nervous system: Secondary | ICD-10-CM | POA: Diagnosis not present

## 2020-05-26 NOTE — Therapy (Signed)
[place], [place], [address] Phone: [phone]   Fax:  [phone]  Physical Therapy Treatment  Patient Details  Name: Devin Valdez MRN: [id number] Date of Birth: 1941/02/15 Referring Provider (PT): Jenna Luo MD   Encounter Date: 05/26/2020   PT End of Session - 05/26/20 1124    Visit Number 12    Number of Visits 16    Date for PT Re-Evaluation 06/12/20    Authorization Type Medicare Part A; [place] secondary    Authorization - Visit Number 12    Authorization - Number of Visits 20    Progress Note Due on Visit 16    PT Start Time 1120    PT Stop Time 1158    PT Time Calculation (min) 38 min    Equipment Utilized During Treatment Gait belt    Activity Tolerance Patient limited by fatigue;Patient tolerated treatment well    Behavior During Therapy [facility] for tasks assessed/performed           Past Medical History:  Diagnosis Date  . Anemia   . Anxiety   . 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 Depression   . Falls   . Hallucinations   . Hyperlipidemia   . Hypertension   . Stroke [facility])    left paracentral pons infarct    Past Surgical History:  Procedure Laterality Date  . EYE SURGERY    . HERNIA REPAIR    . POSTERIOR CERVICAL LAMINECTOMY     with fusion C3-C7 10/19- Dr. Brigitte Pulse at [facility]    There were no vitals filed for this visit.   Subjective Assessment - 05/26/20 1123    Subjective Patient reports no new complaints. Says he had a busy weekend.    How long can you stand comfortably? maybe 5-7 minutes while showering    How long can you walk comfortably? 5 minutes    Patient Stated Goals Get back to where I was with balance and walking                             [facility] Adult PT Treatment/Exercise - 05/26/20 0001      Knee/Hip Exercises: Standing   Heel Raises Both;2 sets;10 reps    Knee Flexion Both;2 sets;10 reps    Knee  Flexion Limitations 1#    Hip Abduction Both;2 sets;10 reps    Abduction Limitations 1#    Hip Extension Both;2 sets;10 reps    Extension Limitations 1#    Lateral Step Up Both;1 set;10 reps;Hand Hold: 2;Step Height: 6"    Forward Step Up Both;1 set;10 reps;Hand Hold: 2;Step Height: 6"    Gait Training 200 feet with quad cane       Knee/Hip Exercises: Seated   Sit to Sand 2 sets;10 reps;without UE support               Balance Exercises - 05/26/20 0001      Balance Exercises: Standing   Balance Beam tandem gait 3RT; sidestepping 2 RT                PT Short Term Goals - 05/18/20 1511      PT SHORT TERM GOAL #1   Title Patient will be independent with initial HEP and self-management strategies to improve functional outcomes    Baseline Reports compliance    Time 4  Period Weeks    Status Achieved    Target Date 05/15/20      PT SHORT TERM GOAL #2   Title Patient will be able to perform stand x 5 in < 30 seconds to demonstrate improvement in functional mobility and reduced risk for falls.    Baseline Current: 17.6 sec with UEs    Time 4    Period Weeks    Status Achieved    Target Date 05/15/20      PT SHORT TERM GOAL #3   Title Patient will be able to ambulate at least 225 feet during 2MWT with LRAD to demonstrate improved ability to perform functional mobility and associated tasks.    Baseline Current: 275 with quad cane    Time 4    Period Weeks    Status Achieved    Target Date 05/15/20             PT Long Term Goals - 05/18/20 1512      PT LONG TERM GOAL #1   Title Patient will be able to perform stand x 5 in < 15 seconds to demonstrate improvement in functional mobility and reduced risk for falls.    Baseline Current: 17.6 sec with UEs    Time 8    Period Weeks    Status On-going      PT LONG TERM GOAL #2   Title Patient will be able to maintain tandem stance >20 seconds on BLEs to improve stability and reduce risk for falls    Baseline  Able to hold 30 sec semi tandem with RT foot forward, can do 30 sec tendem with LT foot forward    Time 8    Period Weeks    Status Partially Met      PT LONG TERM GOAL #3   Title Patient will be able to ambulate at least 350 feet during 2MWT with LRAD to demonstrate improved ability to perform functional mobility and associated tasks.    Baseline Current: 275 with quad cane    Time 8    Period Weeks    Status On-going      PT LONG TERM GOAL #4   Title Patient will report at least 75% overall improvement in subjective complaint to indicate improvement in ability to perform ADLs.    Baseline Reports 85%    Time 8    Period Weeks    Status Achieved      PT LONG TERM GOAL #5   Title Patient will improve DGI score by 3 points to demonstrate improvement in functional mobility and reduced risk for falls.    Baseline improved 6 points    Time 8    Period Weeks    Status Achieved                 Plan - 05/26/20 1631    Clinical Impression Statement Patient verbalized increased fatigue and frustration with progress during today's session. Patient stated he is frustrated because he feels he can do better in the afternoon, and does not do well in the mornings, but his next appointment is scheduled in the AM. Patient encouraged to reschedule appointment time if he is able. Patient otherwise tolerated session well. Demoes slight increase in fatigue, but handled added dynamic balance challenge well. Patient with LOB x 2 during tandem gait and sidestepping on balance beam, but was corrected with min A from therapist and improved subsequent performance with verbal cues. Patient will continue to benefit  from skilled therapy services to progress LE strength and functional tolerance to improve functional mobility and overall QOL.    Personal Factors and Comorbidities Age;Comorbidity 3+    Comorbidities CVA, cervical stenosis, depression    Examination-Activity Limitations Bathing;Stand;Locomotion  Level;Bed Mobility;Transfers;Stairs;Squat    Examination-Participation Restrictions Yard Work;Cleaning;Meal Prep;Community Activity    Stability/Clinical Decision Making Stable/Uncomplicated    Rehab Potential Good    PT Frequency 2x / week    PT Duration 8 weeks    PT Treatment/Interventions ADLs/Self Care Home Management;Aquatic Therapy;Cryotherapy;Electrical Stimulation;Moist Heat;DME Instruction;Gait training;Stair training;Functional mobility training;Therapeutic activities;Therapeutic exercise;Balance training;Neuromuscular re-education;Patient/family education;Orthotic Fit/Training;Manual techniques;Passive range of motion;Biofeedback;Vestibular;Spinal Manipulations;Visual/perceptual remediation/compensation;Dry needling;Energy conservation;Joint Manipulations;Splinting;Taping;Vasopneumatic Device    PT Next Visit Plan Continue to progress LE strength and functional tolerance as able. Continue gait and stair training.    PT Home Exercise Plan 04/15/20: sit/stand, heel raise 04/29/20: seated march, LAQs, seated heel/ toe raise    Consulted and Agree with Plan of Care Patient           Patient will benefit from skilled therapeutic intervention in order to improve the following deficits and impairments:  Abnormal gait, Decreased knowledge of use of DME, Improper body mechanics, Decreased coordination, Decreased mobility, Postural dysfunction, Decreased activity tolerance, Decreased endurance, Decreased strength, Decreased balance, Difficulty walking  Visit Diagnosis: Muscle weakness (generalized)  Other abnormalities of gait and mobility  Other symptoms and signs involving the nervous system     Problem List Patient Active Problem List   Diagnosis Date Noted  . 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 Vitreomacular traction, left 04/08/2020  . Cystoid macular edema of left eye 04/08/2020  . 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 Acute  right-sided weakness 01/13/2020  . Difficulty urinating 01/13/2020  . Stroke ([place])   . CVA (cerebral vascular accident) ([place]) 12/20/2019  . 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[place])   . PVC (premature ventricular contraction)   . Cervical stenosis of spinal canal   . Failure to thrive in adult 05/26/2017  . Failure to thrive (0-17) 05/25/2017  . Abdominal pulsatile mass 05/25/2017  . Anemia 05/25/2017  . Essential hypertension 05/25/2017  . Gait instability 05/20/2017  . Frequent falls 05/20/2017  . Depression 05/20/2017    4:37 PM, 05/26/20 Josue Hector PT DPT  Physical Therapist with [facility]  [phone] [place] [address] [place], [place], [address] Phone: [phone]   Fax:  [phone]  Name: Devin Valdez MRN: [id number] Date of Birth: 02/14/1941

## 2020-05-28 ENCOUNTER — Ambulatory Visit (HOSPITAL_COMMUNITY): Payer: Medicare Other | Admitting: Physical Therapy

## 2020-05-28 ENCOUNTER — Encounter (HOSPITAL_COMMUNITY): Payer: Self-pay | Admitting: Physical Therapy

## 2020-05-28 ENCOUNTER — Other Ambulatory Visit: Payer: Self-pay

## 2020-05-28 DIAGNOSIS — M6281 Muscle weakness (generalized): Secondary | ICD-10-CM | POA: Diagnosis not present

## 2020-05-28 DIAGNOSIS — R29818 Other symptoms and signs involving the nervous system: Secondary | ICD-10-CM

## 2020-05-28 DIAGNOSIS — R2689 Other abnormalities of gait and mobility: Secondary | ICD-10-CM

## 2020-05-28 DIAGNOSIS — R278 Other lack of coordination: Secondary | ICD-10-CM | POA: Diagnosis not present

## 2020-05-28 NOTE — Therapy (Signed)
[place], [place], [address] Phone: [phone]   Fax:  [phone]  Physical Therapy Treatment  Patient Details  Name: Devin Valdez MRN: [id number] Date of Birth: August 27, 1941 Referring Provider (PT): Jenna Luo MD   Encounter Date: 05/28/2020   PT End of Session - 05/28/20 1041    Visit Number 13    Number of Visits 16    Date for PT Re-Evaluation 06/12/20    Authorization Type Medicare Part A; [place] secondary    Authorization - Visit Number 13    Authorization - Number of Visits 20    Progress Note Due on Visit 16    PT Start Time 1038    PT Stop Time 1120    PT Time Calculation (min) 42 min    Equipment Utilized During Treatment Gait belt    Activity Tolerance Patient limited by fatigue;Patient tolerated treatment well    Behavior During Therapy [facility] for tasks assessed/performed           Past Medical History:  Diagnosis Date  . Anemia   . Anxiety   . 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 Depression   . Falls   . Hallucinations   . Hyperlipidemia   . Hypertension   . Stroke [facility])    left paracentral pons infarct    Past Surgical History:  Procedure Laterality Date  . EYE SURGERY    . HERNIA REPAIR    . POSTERIOR CERVICAL LAMINECTOMY     with fusion C3-C7 10/19- Dr. Brigitte Pulse at [facility]    There were no vitals filed for this visit.   Subjective Assessment - 05/28/20 1041    Subjective Patient reports no new complaints.    How long can you stand comfortably? maybe 5-7 minutes while showering    How long can you walk comfortably? 5 minutes    Patient Stated Goals Get back to where I was with balance and walking    Currently in Pain? No/denies                             [facility] Adult PT Treatment/Exercise - 05/28/20 0001      Knee/Hip Exercises: Standing   Lateral Step Up Both;1 set;10 reps;Hand Hold: 2;Step Height: 6"    Forward  Step Up Both;1 set;10 reps;Hand Hold: 2;Step Height: 6"    Rocker Board 2 minutes    Rocker Board Limitations PF/DF/INV/EV     Gait Training 450 feet with no AD       Knee/Hip Exercises: Seated   Sit to Sand 2 sets;10 reps;without UE support               Balance Exercises - 05/28/20 0001      Balance Exercises: Standing   Balance Beam tandem gait 2RT; sidestepping 2 RT     Gait with Head Turns Forward;4 reps   2 x horizontal, 2 x vertical               PT Short Term Goals - 05/18/20 1511      PT SHORT TERM GOAL #1   Title Patient will be independent with initial HEP and self-management strategies to improve functional outcomes    Baseline Reports compliance    Time 4    Period Weeks    Status Achieved    Target Date 05/15/20  PT SHORT TERM GOAL #2   Title Patient will be able to perform stand x 5 in < 30 seconds to demonstrate improvement in functional mobility and reduced risk for falls.    Baseline Current: 17.6 sec with UEs    Time 4    Period Weeks    Status Achieved    Target Date 05/15/20      PT SHORT TERM GOAL #3   Title Patient will be able to ambulate at least 225 feet during 2MWT with LRAD to demonstrate improved ability to perform functional mobility and associated tasks.    Baseline Current: 275 with quad cane    Time 4    Period Weeks    Status Achieved    Target Date 05/15/20             PT Long Term Goals - 05/18/20 1512      PT LONG TERM GOAL #1   Title Patient will be able to perform stand x 5 in < 15 seconds to demonstrate improvement in functional mobility and reduced risk for falls.    Baseline Current: 17.6 sec with UEs    Time 8    Period Weeks    Status On-going      PT LONG TERM GOAL #2   Title Patient will be able to maintain tandem stance >20 seconds on BLEs to improve stability and reduce risk for falls    Baseline Able to hold 30 sec semi tandem with RT foot forward, can do 30 sec tendem with LT foot forward     Time 8    Period Weeks    Status Partially Met      PT LONG TERM GOAL #3   Title Patient will be able to ambulate at least 350 feet during 2MWT with LRAD to demonstrate improved ability to perform functional mobility and associated tasks.    Baseline Current: 275 with quad cane    Time 8    Period Weeks    Status On-going      PT LONG TERM GOAL #4   Title Patient will report at least 75% overall improvement in subjective complaint to indicate improvement in ability to perform ADLs.    Baseline Reports 85%    Time 8    Period Weeks    Status Achieved      PT LONG TERM GOAL #5   Title Patient will improve DGI score by 3 points to demonstrate improvement in functional mobility and reduced risk for falls.    Baseline improved 6 points    Time 8    Period Weeks    Status Achieved                 Plan - 05/28/20 1126    Clinical Impression Statement Patient with improved ambulatory distance today. Held weighted ther ex per patient request, says this makes him too tired and he cannot perform as he would like to with balance activity. Patient continues to demo mild fatigue with extended functional activity, but has improved since starting therapy. Patient will continue to benefit from skilled therapy service to progress strength and endurance to improve functional mobility and reduce risk of falls.    Personal Factors and Comorbidities Age;Comorbidity 3+    Comorbidities CVA, cervical stenosis, depression    Examination-Activity Limitations Bathing;Stand;Locomotion Level;Bed Mobility;Transfers;Stairs;Squat    Examination-Participation Restrictions Yard Work;Cleaning;Meal Prep;Community Activity    Stability/Clinical Decision Making Stable/Uncomplicated    Rehab Potential Good    PT  Frequency 2x / week    PT Duration 8 weeks    PT Treatment/Interventions ADLs/Self Care Home Management;Aquatic Therapy;Cryotherapy;Electrical Stimulation;Moist Heat;DME Instruction;Gait training;Stair  training;Functional mobility training;Therapeutic activities;Therapeutic exercise;Balance training;Neuromuscular re-education;Patient/family education;Orthotic Fit/Training;Manual techniques;Passive range of motion;Biofeedback;Vestibular;Spinal Manipulations;Visual/perceptual remediation/compensation;Dry needling;Energy conservation;Joint Manipulations;Splinting;Taping;Vasopneumatic Device    PT Next Visit Plan Continue to progress LE strength and functional tolerance as able. Continue gait and stair training.    PT Home Exercise Plan 04/15/20: sit/stand, heel raise 04/29/20: seated march, LAQs, seated heel/ toe raise    Consulted and Agree with Plan of Care Patient           Patient will benefit from skilled therapeutic intervention in order to improve the following deficits and impairments:  Abnormal gait, Decreased knowledge of use of DME, Improper body mechanics, Decreased coordination, Decreased mobility, Postural dysfunction, Decreased activity tolerance, Decreased endurance, Decreased strength, Decreased balance, Difficulty walking  Visit Diagnosis: Muscle weakness (generalized)  Other abnormalities of gait and mobility  Other symptoms and signs involving the nervous system     Problem List Patient Active Problem List   Diagnosis Date Noted  . 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 Vitreomacular traction, left 04/08/2020  . Cystoid macular edema of left eye 04/08/2020  . 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[place]) 01/13/2020  . Acute right-sided weakness 01/13/2020  . Difficulty urinating 01/13/2020  . Stroke ([place])   . CVA (cerebral vascular accident) ([place]) 12/20/2019  . 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[place])   . PVC (premature ventricular contraction)   . Cervical stenosis of spinal canal   . Failure to thrive in adult 05/26/2017  . Failure to thrive (0-17) 05/25/2017  . Abdominal pulsatile mass 05/25/2017  . Anemia 05/25/2017  . Essential hypertension  05/25/2017  . Gait instability 05/20/2017  . Frequent falls 05/20/2017  . Depression 05/20/2017   11:27 AM, 05/28/20 Josue Hector PT DPT  Physical Therapist with [facility]  [phone] [place] [address] [place], [place], [address] Phone: [phone]   Fax:  [phone]  Name: Devin Valdez MRN: [id number] Date of Birth: 12-14-1940

## 2020-06-01 ENCOUNTER — Ambulatory Visit (HOSPITAL_COMMUNITY): Payer: Medicare Other | Admitting: Physical Therapy

## 2020-06-01 ENCOUNTER — Other Ambulatory Visit: Payer: Self-pay

## 2020-06-01 ENCOUNTER — Encounter (HOSPITAL_COMMUNITY): Payer: Self-pay | Admitting: Physical Therapy

## 2020-06-01 DIAGNOSIS — R278 Other lack of coordination: Secondary | ICD-10-CM | POA: Diagnosis not present

## 2020-06-01 DIAGNOSIS — R2689 Other abnormalities of gait and mobility: Secondary | ICD-10-CM | POA: Diagnosis not present

## 2020-06-01 DIAGNOSIS — M6281 Muscle weakness (generalized): Secondary | ICD-10-CM

## 2020-06-01 DIAGNOSIS — R29818 Other symptoms and signs involving the nervous system: Secondary | ICD-10-CM

## 2020-06-01 NOTE — Therapy (Signed)
[place], [place], [address] Phone: [phone]   Fax:  [phone]  Physical Therapy Treatment  Patient Details  Name: Devin Valdez MRN: [id number] Date of Birth: 1941-02-21 Referring Provider (PT): Jenna Luo MD   Encounter Date: 06/01/2020   PT End of Session - 06/01/20 1115    Visit Number 14    Number of Visits 16    Date for PT Re-Evaluation 06/12/20    Authorization Type Medicare Part A; [place] secondary    Authorization - Visit Number 14    Authorization - Number of Visits 20    Progress Note Due on Visit 16    PT Start Time 1116    PT Stop Time 1156    PT Time Calculation (min) 40 min    Equipment Utilized During Treatment Gait belt    Activity Tolerance Patient tolerated treatment well    Behavior During Therapy WFL for tasks assessed/performed           Past Medical History:  Diagnosis Date  . Anemia   . Anxiety   . 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 Depression   . Falls   . Hallucinations   . Hyperlipidemia   . Hypertension   . Stroke [facility])    left paracentral pons infarct    Past Surgical History:  Procedure Laterality Date  . EYE SURGERY    . HERNIA REPAIR    . POSTERIOR CERVICAL LAMINECTOMY     with fusion C3-C7 10/19- Dr. Brigitte Pulse at [facility]    There were no vitals filed for this visit.   Subjective Assessment - 06/01/20 1118    Subjective Patient says he had a busy weekend and got a lot done today. Says he did a lot of walking this morning and his legs are somewhat tired.    How long can you stand comfortably? maybe 5-7 minutes while showering    How long can you walk comfortably? 5 minutes    Patient Stated Goals Get back to where I was with balance and walking    Currently in Pain? No/denies                             [facility] Adult PT Treatment/Exercise - 06/01/20 0001      Knee/Hip Exercises: Machines for  Strengthening   Cybex Leg Press 30# DL 3 x 10       Knee/Hip Exercises: Standing   Heel Raises Both;2 sets;10 reps    Knee Flexion Both;2 sets;10 reps    Hip Abduction Both;2 sets;10 reps    Lateral Step Up Both;2 sets;10 reps;Hand Hold: 2;Step Height: 6"    Stairs 7 inch 5RT reciprocal gait, no hand rail     Gait Training 450 feet with no AD       Knee/Hip Exercises: Seated   Sit to Sand 2 sets;10 reps;without UE support                    PT Short Term Goals - 05/18/20 1511      PT SHORT TERM GOAL #1   Title Patient will be independent with initial HEP and self-management strategies to improve functional outcomes    Baseline Reports compliance    Time 4    Period Weeks    Status Achieved    Target Date 05/15/20  PT SHORT TERM GOAL #2   Title Patient will be able to perform stand x 5 in < 30 seconds to demonstrate improvement in functional mobility and reduced risk for falls.    Baseline Current: 17.6 sec with UEs    Time 4    Period Weeks    Status Achieved    Target Date 05/15/20      PT SHORT TERM GOAL #3   Title Patient will be able to ambulate at least 225 feet during 2MWT with LRAD to demonstrate improved ability to perform functional mobility and associated tasks.    Baseline Current: 275 with quad cane    Time 4    Period Weeks    Status Achieved    Target Date 05/15/20             PT Long Term Goals - 05/18/20 1512      PT LONG TERM GOAL #1   Title Patient will be able to perform stand x 5 in < 15 seconds to demonstrate improvement in functional mobility and reduced risk for falls.    Baseline Current: 17.6 sec with UEs    Time 8    Period Weeks    Status On-going      PT LONG TERM GOAL #2   Title Patient will be able to maintain tandem stance >20 seconds on BLEs to improve stability and reduce risk for falls    Baseline Able to hold 30 sec semi tandem with RT foot forward, can do 30 sec tendem with LT foot forward    Time 8     Period Weeks    Status Partially Met      PT LONG TERM GOAL #3   Title Patient will be able to ambulate at least 350 feet during 2MWT with LRAD to demonstrate improved ability to perform functional mobility and associated tasks.    Baseline Current: 275 with quad cane    Time 8    Period Weeks    Status On-going      PT LONG TERM GOAL #4   Title Patient will report at least 75% overall improvement in subjective complaint to indicate improvement in ability to perform ADLs.    Baseline Reports 85%    Time 8    Period Weeks    Status Achieved      PT LONG TERM GOAL #5   Title Patient will improve DGI score by 3 points to demonstrate improvement in functional mobility and reduced risk for falls.    Baseline improved 6 points    Time 8    Period Weeks    Status Achieved                 Plan - 06/01/20 1156    Clinical Impression Statement Patient tolerated session well today. Focused session on LE strengthening and increased tolerance to function activity. Patient shows improved tolerance and required less breaks for rest, though does note increased fatigue in LEs. Added stair ambulation and leg pressing for progressing LE strengthening. Patient will continue to benefit from killed therapy services to progress LE strength and endurance to improve functional mobility and ADLs. Transition to DC with HEP in next 1-2 visits.    Personal Factors and Comorbidities Age;Comorbidity 3+    Comorbidities CVA, cervical stenosis, depression    Examination-Activity Limitations Bathing;Stand;Locomotion Level;Bed Mobility;Transfers;Stairs;Squat    Examination-Participation Restrictions Yard Work;Cleaning;Meal Prep;Community Activity    Stability/Clinical Decision Making Stable/Uncomplicated    Rehab Potential Good  PT Frequency 2x / week    PT Duration 8 weeks    PT Treatment/Interventions ADLs/Self Care Home Management;Aquatic Therapy;Cryotherapy;Electrical Stimulation;Moist Heat;DME  Instruction;Gait training;Stair training;Functional mobility training;Therapeutic activities;Therapeutic exercise;Balance training;Neuromuscular re-education;Patient/family education;Orthotic Fit/Training;Manual techniques;Passive range of motion;Biofeedback;Vestibular;Spinal Manipulations;Visual/perceptual remediation/compensation;Dry needling;Energy conservation;Joint Manipulations;Splinting;Taping;Vasopneumatic Device    PT Next Visit Plan Continue to progress LE strength and functional tolerance as able. Continue gait and stair training.    PT Home Exercise Plan 04/15/20: sit/stand, heel raise 04/29/20: seated march, LAQs, seated heel/ toe raise    Consulted and Agree with Plan of Care Patient           Patient will benefit from skilled therapeutic intervention in order to improve the following deficits and impairments:  Abnormal gait, Decreased knowledge of use of DME, Improper body mechanics, Decreased coordination, Decreased mobility, Postural dysfunction, Decreased activity tolerance, Decreased endurance, Decreased strength, Decreased balance, Difficulty walking  Visit Diagnosis: Muscle weakness (generalized)  Other abnormalities of gait and mobility  Other symptoms and signs involving the nervous system     Problem List Patient Active Problem List   Diagnosis Date Noted  . 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 Vitreomacular traction, left 04/08/2020  . Cystoid macular edema of left eye 04/08/2020  . 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[place]) 01/13/2020  . Acute right-sided weakness 01/13/2020  . Difficulty urinating 01/13/2020  . Stroke ([place])   . CVA (cerebral vascular accident) ([place]) 12/20/2019  . 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[place])   . PVC (premature ventricular contraction)   . Cervical stenosis of spinal canal   . Failure to thrive in adult 05/26/2017  . Failure to thrive (0-17) 05/25/2017  . Abdominal pulsatile mass 05/25/2017  . Anemia  05/25/2017  . Essential hypertension 05/25/2017  . Gait instability 05/20/2017  . Frequent falls 05/20/2017  . Depression 05/20/2017   12:10 PM, 06/01/20 Josue Hector PT DPT  Physical Therapist with [facility]  [phone] [place] [address] [place], [place], [address] Phone: [phone]   Fax:  [phone]  Name: Devin Valdez MRN: [id number] Date of Birth: 08-26-1941

## 2020-06-03 ENCOUNTER — Ambulatory Visit (HOSPITAL_COMMUNITY): Payer: Medicare Other | Admitting: Physical Therapy

## 2020-06-03 ENCOUNTER — Other Ambulatory Visit: Payer: Self-pay

## 2020-06-03 ENCOUNTER — Encounter (HOSPITAL_COMMUNITY): Payer: Self-pay | Admitting: Physical Therapy

## 2020-06-03 ENCOUNTER — Telehealth: Payer: Self-pay | Admitting: Family Medicine

## 2020-06-03 DIAGNOSIS — R278 Other lack of coordination: Secondary | ICD-10-CM | POA: Diagnosis not present

## 2020-06-03 DIAGNOSIS — R2689 Other abnormalities of gait and mobility: Secondary | ICD-10-CM | POA: Diagnosis not present

## 2020-06-03 DIAGNOSIS — M6281 Muscle weakness (generalized): Secondary | ICD-10-CM

## 2020-06-03 DIAGNOSIS — R29818 Other symptoms and signs involving the nervous system: Secondary | ICD-10-CM

## 2020-06-03 NOTE — Telephone Encounter (Signed)
CB# [phone] Wife Devin Valdez would like to have referral for Devin Valdez have occupational therapy in [place] location.

## 2020-06-03 NOTE — Therapy (Signed)
[place] [facility] [address] [place], [place], [address] Phone: [phone]   Fax:  [phone]  Physical Therapy Treatment/ Discharge Summary  Patient Details  Name: Devin Valdez MRN: [id number] Date of Birth: October 30, 1941 Referring Provider (PT): Lynnea Ferrier MD   Encounter Date: 06/03/2020   PHYSICAL THERAPY DISCHARGE SUMMARY  Visits from Start of Care: 15  Current functional level related to goals / functional outcomes: See below   Remaining deficits: See below   Education / Equipment: See assessment  Plan: Patient agrees to discharge.  Patient goals were met. Patient is being discharged due to meeting the stated rehab goals.  ?????        PT End of Session - 06/03/20 1127    Visit Number 15    Number of Visits 16    Date for PT Re-Evaluation 06/12/20    Authorization Type Medicare Part A; [employer] secondary    Authorization - Visit Number 15    Authorization - Number of Visits 20    Progress Note Due on Visit 16    PT Start Time 1120    PT Stop Time 1155    PT Time Calculation (min) 35 min    Equipment Utilized During Treatment Gait belt    Activity Tolerance Patient tolerated treatment well    Behavior During Therapy WFL for tasks assessed/performed           Past Medical History:  Diagnosis Date   Anemia    Anxiety    Cervical stenosis of spinal canal    mri 8/19- severe   Depression    Falls    Hallucinations    Hyperlipidemia    Hypertension    Stroke ([facility])    left paracentral pons infarct    Past Surgical History:  Procedure Laterality Date   EYE SURGERY     HERNIA REPAIR     POSTERIOR CERVICAL LAMINECTOMY     with fusion C3-C7 10/19- Dr. Leeanne Rio at [facility]    There were no vitals filed for this visit.   Subjective Assessment - 06/03/20 1124    Subjective Patient says he had a rough night last night, but wants to retest his goals to see if he is ready for DC from  therapy today.    How long can you stand comfortably? maybe 5-7 minutes while showering    How long can you walk comfortably? 5 minutes    Patient Stated Goals Get back to where I was with balance and walking    Currently in Pain? No/denies              [facility] PT Assessment - 06/03/20 0001      Assessment   Medical Diagnosis Weakness     Referring Provider (PT) Lynnea Ferrier MD    Onset Date/Surgical Date 12/20/19    Prior Therapy Yes       Precautions   Precautions Fall      Restrictions   Weight Bearing Restrictions No      Balance Screen   Has the patient fallen in the past 6 months No      Home Environment   Living Environment Private residence    Living Arrangements Spouse/significant other    Available Help at Discharge Family    Type of Home House    Home Access Level entry    Home Layout Multi-level      Prior Function   Level of Independence Independent with basic  ADLs      Cognition   Overall Cognitive Status Within Functional Limits for tasks assessed      Transfers   Five time sit to stand comments  14.72 sec no UE   was 17.6 sec      Ambulation/Gait   Ambulation/Gait Yes    Ambulation/Gait Assistance 7: Independent    Ambulation Distance (Feet) 412 Feet    Assistive device None    Gait Pattern Within Functional Limits    Ambulation Surface Level;Indoor    Gait Comments      Static Standing Balance   Static Standing Balance -  Activities  Tandam Stance - Right Leg;Tandam Stance - Left Leg    Static Standing - Comment/# of Minutes 30 sec bilateral      Dynamic Gait Index   Level Surface Normal    Change in Gait Speed Normal    Gait with Horizontal Head Turns Mild Impairment    Gait with Vertical Head Turns Normal    Gait and Pivot Turn Normal    Step Over Obstacle Normal    Step Around Obstacles Normal    Steps Mild Impairment    Total Score 22                                 PT Education - 06/03/20 1754     Education Details on reassessment findings transition to HEP for DC. Educated on, and issued, comprehensive HEP    Person(s) Educated Patient    Methods Explanation;Handout    Comprehension Verbalized understanding            PT Short Term Goals - 05/18/20 1511      PT SHORT TERM GOAL #1   Title Patient will be independent with initial HEP and self-management strategies to improve functional outcomes    Baseline Reports compliance    Time 4    Period Weeks    Status Achieved    Target Date 05/15/20      PT SHORT TERM GOAL #2   Title Patient will be able to perform stand x 5 in < 30 seconds to demonstrate improvement in functional mobility and reduced risk for falls.    Baseline Current: 17.6 sec with UEs    Time 4    Period Weeks    Status Achieved    Target Date 05/15/20      PT SHORT TERM GOAL #3   Title Patient will be able to ambulate at least 225 feet during with LRAD to demonstrate improved ability to perform functional mobility and associated tasks.    Baseline Current: 275 with quad cane    Time 4    Period Weeks    Status Achieved    Target Date 05/15/20             PT Long Term Goals - 06/03/20 1140      PT LONG TERM GOAL #1   Title Patient will be able to perform stand x 5 in < 15 seconds to demonstrate improvement in functional mobility and reduced risk for falls.    Baseline Current: 14.7 sec with no UEs    Time 8    Period Weeks    Status Achieved      PT LONG TERM GOAL #2   Title Patient will be able to maintain tandem stance >20 seconds on BLEs to improve stability and reduce risk for falls  Baseline able to hold 30 sec bilaterally    Time 8    Period Weeks    Status Achieved      PT LONG TERM GOAL #3   Title Patient will be able to ambulate at least 350 feet during 2MWT with LRAD to demonstrate improved ability to perform functional mobility and associated tasks.    Baseline Current: 412  feet no AD    Time 8    Period Weeks     Status Achieved      PT LONG TERM GOAL #4   Title Patient will report at least 75% overall improvement in subjective complaint to indicate improvement in ability to perform ADLs.    Baseline Reports 90% improved    Time 8    Period Weeks    Status Achieved      PT LONG TERM GOAL #5   Title Patient will improve DGI score by 3 points to demonstrate improvement in functional mobility and reduced risk for falls.    Baseline improved 6 points    Time 8    Period Weeks    Status Achieved                 Plan - 06/03/20 1755    Clinical Impression Statement Reassessed patient progress toward therapy goals today. Patient has made very good progress and has currently met all therapy goals. Patient reports 90% improvement in overall function since starting therapy and verbalized his improved ability to perform daily tasks. Patient educated on importance of keeping up with exercise for maintaining current level of progress. Patient educated on and issued HEP handout for transition to DC with home exercise. Patient agrees with this plan. Patient instructed to follow up with therapy services with any further questions or concerns    Personal Factors and Comorbidities Age;Comorbidity 3+    Comorbidities CVA, cervical stenosis, depression    Examination-Activity Limitations Bathing;Stand;Locomotion Level;Bed Mobility;Transfers;Stairs;Squat    Examination-Participation Restrictions Yard Work;Cleaning;Meal Prep;Community Activity    Stability/Clinical Decision Making Stable/Uncomplicated    Rehab Potential Good    PT Treatment/Interventions ADLs/Self Care Home Management;Aquatic Therapy;Cryotherapy;Electrical Stimulation;Moist Heat;DME Instruction;Gait training;Stair training;Functional mobility training;Therapeutic activities;Therapeutic exercise;Balance training;Neuromuscular re-education;Patient/family education;Orthotic Fit/Training;Manual techniques;Passive range of  motion;Biofeedback;Vestibular;Spinal Manipulations;Visual/perceptual remediation/compensation;Dry needling;Energy conservation;Joint Manipulations;Splinting;Taping;Vasopneumatic Device    PT Next Visit Plan DC    PT Home Exercise Plan 04/15/20: sit/stand, heel raise 04/29/20: seated march, LAQs, seated heel/ toe raise    Consulted and Agree with Plan of Care Patient           Patient will benefit from skilled therapeutic intervention in order to improve the following deficits and impairments:  Abnormal gait, Decreased knowledge of use of DME, Improper body mechanics, Decreased coordination, Decreased mobility, Postural dysfunction, Decreased activity tolerance, Decreased endurance, Decreased strength, Decreased balance, Difficulty walking  Visit Diagnosis: Muscle weakness (generalized)  Other abnormalities of gait and mobility  Other symptoms and signs involving the nervous system     Problem List Patient Active Problem List   Diagnosis Date Noted   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 04/08/2020   Vitreomacular traction, left 04/08/2020   Cystoid macular edema of left eye 04/08/2020   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[place]) 01/13/2020   Acute right-sided weakness 01/13/2020   Difficulty urinating 01/13/2020   Stroke ([place])    CVA (cerebral vascular accident) ([place]) 12/20/2019   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[facility])    PVC (premature ventricular contraction)    Cervical stenosis of spinal canal  Failure to thrive in adult 05/26/2017   Failure to thrive (0-17) 05/25/2017   Abdominal pulsatile mass 05/25/2017   Anemia 05/25/2017   Essential hypertension 05/25/2017   Gait instability 05/20/2017   Frequent falls 05/20/2017   Depression 05/20/2017    6:02 PM, 06/03/20 Josue Hector PT DPT  Physical Therapist with [facility]  [phone] [place], [place], [address] Phone: [phone]   Fax:  [phone]  Name: Devin Valdez MRN: [id number] Date of Birth: Jun 17, 1941

## 2020-06-04 ENCOUNTER — Other Ambulatory Visit: Payer: Self-pay | Admitting: Family Medicine

## 2020-06-04 DIAGNOSIS — Z8673 Personal history of transient ischemic attack (TIA), and cerebral infarction without residual deficits: Secondary | ICD-10-CM

## 2020-06-08 ENCOUNTER — Encounter (HOSPITAL_COMMUNITY): Payer: Medicare Other | Admitting: Physical Therapy

## 2020-06-10 ENCOUNTER — Encounter (HOSPITAL_COMMUNITY): Payer: Medicare Other | Admitting: Physical Therapy

## 2020-06-12 ENCOUNTER — Ambulatory Visit (HOSPITAL_COMMUNITY): Payer: Medicare Other | Admitting: Occupational Therapy

## 2020-06-12 ENCOUNTER — Encounter (HOSPITAL_COMMUNITY): Payer: Self-pay | Admitting: Occupational Therapy

## 2020-06-12 ENCOUNTER — Other Ambulatory Visit: Payer: Self-pay

## 2020-06-12 DIAGNOSIS — M6281 Muscle weakness (generalized): Secondary | ICD-10-CM | POA: Diagnosis not present

## 2020-06-12 DIAGNOSIS — R278 Other lack of coordination: Secondary | ICD-10-CM

## 2020-06-12 DIAGNOSIS — R29818 Other symptoms and signs involving the nervous system: Secondary | ICD-10-CM | POA: Diagnosis not present

## 2020-06-12 DIAGNOSIS — R2689 Other abnormalities of gait and mobility: Secondary | ICD-10-CM | POA: Diagnosis not present

## 2020-06-12 NOTE — Therapy (Signed)
[place], [place], [address] Phone: [phone]   Fax:  [phone]  Occupational Therapy Evaluation  Patient Details  Name: Devin Valdez MRN: [id number] Date of Birth: January 22, 1941 Referring Provider (OT): Jenna Luo, MD   Encounter Date: 06/12/2020   OT End of Session - 06/12/20 1328    Visit Number 1    Number of Visits 1    Date for OT Re-Evaluation 06/19/20    Authorization Type 1) Medicare 2) [employer]    OT Start Time 1302    OT Stop Time 1325    OT Time Calculation (min) 23 min    Activity Tolerance Patient tolerated treatment well    Behavior During Therapy [facility] for tasks assessed/performed           Past Medical History:  Diagnosis Date   Anemia    Anxiety    Cervical stenosis of spinal canal    mri 8/19- severe   Depression    Falls    Hallucinations    Hyperlipidemia    Hypertension    Stroke ([place])    left paracentral pons infarct    Past Surgical History:  Procedure Laterality Date   [place]     with fusion C3-C7 10/19- Dr. Brigitte Pulse at [facility]    There were no vitals filed for this visit.   Subjective Assessment - 06/12/20 1327    Subjective  S: My hands don't feel like they should.    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 Patient is currently experiencing hand weakness Dr. Dennard Schaumann has referred patient to occupational therapy for evaluation and treatment.    Patient Stated Goals To increase strength and functional of hands    Currently in Pain? No/denies             [facility] OT Assessment - 06/12/20 1304      Assessment   Medical Diagnosis hx of CVA-hand issues    Onset Date/Surgical Date 12/20/19    Hand Dominance Left    Prior Therapy Yes at this clinic      Precautions   Precautions Fall      Restrictions   Weight  Bearing Restrictions No      Balance Screen   Has the patient fallen in the past 6 months No    Has the patient had a decrease in activity level because of a fear of falling?  No    Is the patient reluctant to leave their home because of a fear of falling?  No      Prior Function   Level of Independence Independent with basic ADLs    Vocation Retired    Leisure Marketing executive, Environmental consultant, cornhole      ADL   ADL comments Pt having difficulty with writing and driving due to tremor like movements when he is nervous      Written Expression   Dominant Hand Left      Cognition   Overall Cognitive Status Within Functional Limits for tasks assessed    Memory Decreased short-term memory      Coordination   9 Hole Peg Test Right;Left    Right 9 Hole Peg Test 32.01"    Left 9 Hole Peg Test 28.42"    Tremors No tremors noted during evaluation-intentional or resting  ROM / Strength   AROM / PROM / Strength Strength      Strength   Overall Strength Comments BUE strength is [facility]    Strength Assessment Site Hand    Right/Left hand Right;Left    Right Hand Gross Grasp Functional    Right Hand Grip (lbs) 75    Right Hand Lateral Pinch 17 lbs    Right Hand 3 Point Pinch 18 lbs    Left Hand Gross Grasp Functional    Left Hand Grip (lbs) 80    Left Hand Lateral Pinch 20 lbs    Left Hand 3 Point Pinch 18 lbs                             OT Short Term Goals - 01/23/20 0935      OT SHORT TERM GOAL #1   Title Patient will be educated on HEP for right hand to increase hand strength and coordination in order to increase his functional performance during ADL tasks.    Time 1    Period Days    Status Achieved    Target Date 01/23/20                    Plan - 06/12/20 1329    Clinical Impression Statement A: Patient is a 79 y/o male S/P Left CVA causing decreased hand strength and coordination resulting in difficulty completing basic ADL and leisure activities.  Pt reporting tremors when he is nervous and not having the confidence to do things such as play golf and cornhole. Pt has improved scores since evaluation in 11/2019 and was educated on strength and coordination being WNL. Problem solved with pt on how to try out different tasks confidently and safely. Pt is in agreement that he does not need OT at this time.    OT Occupational Profile and History Problem Focused Assessment - Including review of records relating to presenting problem    Occupational performance deficits (Please refer to evaluation for details): ADL's;Rest and Sleep;IADL's;Leisure    Rehab Potential Excellent    Clinical Decision Making Limited treatment options, no task modification necessary    Comorbidities Affecting Occupational Performance: None    Modification or Assistance to Complete Evaluation  No modification of tasks or assist necessary to complete eval    OT Frequency One time visit    OT Treatment/Interventions Patient/family education    Plan P: 1 time visit    Consulted and Agree with Plan of Care Patient           Patient will benefit from skilled therapeutic intervention in order to improve the following deficits and impairments:           Visit Diagnosis: Other symptoms and signs involving the nervous system  Other lack of coordination    Problem List Patient Active Problem List   Diagnosis Date Noted   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) 01/13/2020   Acute right-sided weakness 01/13/2020   Difficulty urinating 01/13/2020   Stroke [facility])    CVA (cerebral vascular accident) ([place]) 12/20/2019   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[place])    PVC (premature ventricular contraction)    Cervical stenosis of spinal canal    Failure to thrive in adult 05/26/2017   Failure to thrive (0-17)  05/25/2017   Abdominal pulsatile mass 05/25/2017   Anemia 05/25/2017   Essential hypertension 05/25/2017   Gait instability 05/20/2017   Frequent falls 05/20/2017   Depression 05/20/2017   Guadelupe Sabin, OTR/L  [phone] 06/12/2020, 1:32 PM  [place], [place], [address] Phone: [phone]   Fax:  [phone]  Name: HAZEM KENNER MRN: [id number] Date of Birth: [birth year]

## 2020-06-17 ENCOUNTER — Other Ambulatory Visit: Payer: Self-pay

## 2020-06-17 ENCOUNTER — Encounter (INDEPENDENT_AMBULATORY_CARE_PROVIDER_SITE_OTHER): Payer: Self-pay | Admitting: Ophthalmology

## 2020-06-17 ENCOUNTER — Ambulatory Visit (INDEPENDENT_AMBULATORY_CARE_PROVIDER_SITE_OTHER): Payer: Medicare Other | Admitting: Ophthalmology

## 2020-06-17 DIAGNOSIS — H34812 Central retinal vein occlusion, left eye, with macular edema: Secondary | ICD-10-CM

## 2020-06-17 DIAGNOSIS — H43822 Vitreomacular adhesion, left eye: Secondary | ICD-10-CM

## 2020-06-17 DIAGNOSIS — H353122 Nonexudative age-related macular degeneration, left eye, intermediate dry stage: Secondary | ICD-10-CM | POA: Diagnosis not present

## 2020-06-17 MED ORDER — BEVACIZUMAB CHEMO INJECTION 1.25MG/0.05ML SYRINGE FOR KALEIDOSCOPE
1.2500 mg | INTRAVITREAL | Status: AC | PRN
  Administered 2020-06-17: 1.25 mg via INTRAVITREAL

## 2020-06-17 NOTE — Assessment & Plan Note (Signed)
No foveal distortion, will observe

## 2020-06-17 NOTE — Progress Notes (Signed)
06/17/2020     CHIEF COMPLAINT Patient presents for Retina Follow Up   HISTORY OF PRESENT ILLNESS: Devin Valdez is a 79 y.o. 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  Severity is moderate.  Duration of 5 weeks.  Since onset it is stable.  I, the attending physician,  performed the HPI with the patient and updated documentation appropriately.          Comments    5 Week f\u OS. Possible Avastin OS. OCT  Pt states he has not noticed any changes in vision.        Last edited by Tilda Franco on 06/17/2020 10:34 AM. (History)      Referring physician: [name], MD [id number] [facility] [address] [place],  [place] [address]  HISTORICAL INFORMATION:   Selected notes from the MEDICAL RECORD NUMBER    Lab Results  Component Value Date   HGBA1C 4.6 (L) 01/14/2020     CURRENT MEDICATIONS: No current outpatient medications on file. (Ophthalmic Drugs)   No current facility-administered medications for this visit. (Ophthalmic Drugs)   Current Outpatient Medications (Other)  Medication Sig  . acetaminophen (TYLENOL) 325 MG tablet Take 650 mg by mouth every 6 (six) hours as needed for mild pain or headache.  Marland Kitchen aspirin 81 MG chewable tablet Chew by mouth daily.  Marland Kitchen atorvastatin (LIPITOR) 40 MG tablet TAKE ONE TABLET (40MG  TOTAL) BY MOUTH DAILY AT 6PM  . Coenzyme Q10 (CO Q-10) 200 MG CAPS Take by mouth daily.  . ferrous sulfate 325 (65 FE) MG tablet Take 1 tablet (325 mg total) by mouth 2 (two) times daily with a meal. (Patient taking differently: Take 325 mg by mouth daily with breakfast. )  . losartan (COZAAR) 50 MG tablet TAKE ONE (1) TABLET BY MOUTH EVERY DAY FOR BLOOD PRESSURE (Patient taking differently: Take 50 mg by mouth daily. )  . Multiple Vitamins-Minerals (CENTRUM SILVER ADULT 50+) TABS Take 1 capsule by mouth 2 (two) times daily.  . sildenafil (VIAGRA)  100 MG tablet TAKE ONE TABLET (100MG  TOTAL) BY MOUTH DAILY AS NEEDED FOR ERECTILE DYSFUNCTION (Patient taking differently: Take 100 mg by mouth as needed for erectile dysfunction. )   No current facility-administered medications for this visit. (Other)      REVIEW OF SYSTEMS:    ALLERGIES No Known Allergies  PAST MEDICAL HISTORY Past Medical History:  Diagnosis Date  . Anemia   . Anxiety   . 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 Depression   . Falls   . Hallucinations   . Hyperlipidemia   . Hypertension   . Stroke [facility])    left paracentral pons infarct   Past Surgical History:  Procedure Laterality Date  . EYE SURGERY    . HERNIA REPAIR    . POSTERIOR CERVICAL LAMINECTOMY     with fusion C3-C7 10/19- Dr. Brigitte Pulse at [facility] Family History  Problem Relation Age of Onset  . Multiple sclerosis Son   . Bipolar disorder Son     SOCIAL HISTORY Social History   Tobacco Use  . Smoking status: Former Smoker    Quit date: 11/21/1985    Years since quitting: 34.5  . Smokeless tobacco: Never Used  Substance Use Topics  . Alcohol use: No  .  Drug use: No         OPHTHALMIC EXAM: Base Eye Exam    Visual Acuity (Snellen - Linear)      Right Left   Dist cc 20/40 20/200   Dist ph [place] 20/40 +        Tonometry (Tonopen, 10:39 AM)      Right Left   Pressure 13 15       Pupils      Pupils Dark Light Shape React APD   Right PERRL 3 2.5 Round Slow None   Left PERRL 3 2 Round Brisk None       Visual Fields (Counting fingers)      Left Right    Full Full       Neuro/Psych    Oriented x3: Yes   Mood/Affect: Normal       Dilation    Left eye: 1.0% Mydriacyl, 2.5% Phenylephrine @ 10:39 AM        Slit Lamp and Fundus Exam    External Exam      Right Left   External Normal Normal       Slit Lamp Exam      Right Left   Lids/Lashes Normal Normal   Conjunctiva/Sclera White and quiet White and quiet   Cornea Clear  Clear   Anterior Chamber Deep and quiet Deep and quiet   Iris Round and reactive Round and reactive   Lens Posterior chamber intraocular lens Posterior chamber intraocular lens   Anterior Vitreous Normal Normal       Fundus Exam      Right Left   Posterior Vitreous  Normal   Disc  Early collaterals   C/D Ratio 0.3 0.25   Macula  Macular thickening, Microaneurysms, less Cystoid macular edema,  Intraretinal hemorrhage, large subfoveal druse   Vessels  Hemiretinal vein occlusion superiorly.   Periphery  Normal          IMAGING AND PROCEDURES  Imaging and Procedures for 06/17/20  OCT, Retina - OU - Both Eyes       Right Eye Quality was good. Scan locations included subfoveal. Central Foveal Thickness: 280. Progression has been stable. Findings include vitreomacular adhesion .   Left Eye Scan locations included subfoveal. Central Foveal Thickness: 285. Progression has improved. Findings include vitreomacular adhesion .   Notes OS, improved macular edema component less retinal thickening on antiveg of medications for central retinal vein occlusion       Intravitreal Injection, Pharmacologic Agent - OS - Left Eye       Time Out 06/17/2020. 11:15 AM. Confirmed correct patient, procedure, site, and patient consented.   Anesthesia Topical anesthesia was used. Anesthetic medications included Akten 3.5%.   Procedure Preparation included Ofloxacin , 10% betadine to eyelids, 5% betadine to ocular surface. A 30 gauge needle was used.   Injection:  1.25 mg Bevacizumab (AVASTIN) SOLN   NDC: [address]-9, Lot: [id number]   Route: Intravitreal, Site: Left Eye, Waste: 0 mg  Post-op Post injection exam found visual acuity of at least counting fingers. The patient tolerated the procedure well. There were no complications. The patient received written and verbal post procedure care education. Post injection medications were not given.                  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  We will repeat injection of antivegF  medications today at 5-week interval and extend interval to 6-week evaluation  Vitreomacular adhesion of left eye No foveal distortion, will observe      ICD-10-CM   1. 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 Vitreomacular adhesion of left eye  H43.822     1.  2.  3.  Ophthalmic Meds Ordered this visit:  Meds ordered this encounter  Medications  . Bevacizumab (AVASTIN) SOLN 1.25 mg       Return in about 6 weeks (around 07/29/2020) for dilate, OS, AVASTIN OCT.  There are no Patient Instructions on file for this visit.   Explained the diagnoses, plan, and follow up with the patient and they expressed understanding.  Patient expressed understanding of the importance of proper follow up care.   Clent Demark Versie Soave M.D. Diseases & Surgery of the Retina and Vitreous [facility] [place] 06/17/20     Abbreviations: M myopia (nearsighted); A astigmatism; H hyperopia (farsighted); P presbyopia; Mrx spectacle prescription;  CTL contact lenses; OD right eye; OS left eye; OU both eyes  XT exotropia; ET esotropia; PEK punctate epithelial keratitis; PEE punctate epithelial erosions; DES dry eye syndrome; MGD meibomian gland dysfunction; ATs artificial tears; PFAT's preservative free artificial tears; [place] nuclear sclerotic cataract; PSC posterior subcapsular cataract; ERM epi-retinal membrane; PVD posterior vitreous detachment; RD retinal detachment; DM diabetes mellitus; DR diabetic retinopathy; NPDR non-proliferative diabetic retinopathy; PDR proliferative diabetic retinopathy; CSME clinically  significant macular edema; DME diabetic macular edema; dbh dot blot hemorrhages; CWS cotton wool spot; POAG primary open angle glaucoma; C/D cup-to-disc ratio; HVF humphrey visual field; GVF goldmann visual field; OCT optical coherence tomography; IOP intraocular pressure; BRVO Branch retinal vein occlusion; CRVO central retinal vein occlusion; CRAO central retinal artery occlusion; BRAO branch retinal artery occlusion; RT retinal tear; SB scleral buckle; PPV pars plana vitrectomy; VH Vitreous hemorrhage; PRP panretinal laser photocoagulation; IVK intravitreal kenalog; VMT vitreomacular traction; MH Macular hole;  NVD neovascularization of the disc; NVE neovascularization elsewhere; AREDS age related eye disease study; ARMD age related macular degeneration; POAG primary open angle glaucoma; EBMD epithelial/anterior basement membrane dystrophy; ACIOL anterior chamber intraocular lens; IOL intraocular lens; PCIOL posterior chamber intraocular lens; Phaco/IOL phacoemulsification with intraocular lens placement; [place] photorefractive keratectomy; LASIK laser assisted in situ keratomileusis; HTN hypertension; DM diabetes mellitus; COPD chronic obstructive pulmonary disease

## 2020-06-17 NOTE — Assessment & Plan Note (Signed)
Ischemic component of the hemispheric central retinal vein occlusion superiorly OS has improved.  We will repeat injection of antivegF medications today at 5-week interval and extend interval to 6-week evaluation

## 2020-06-22 ENCOUNTER — Encounter (INDEPENDENT_AMBULATORY_CARE_PROVIDER_SITE_OTHER): Payer: Medicare Other | Admitting: Ophthalmology

## 2020-07-13 ENCOUNTER — Ambulatory Visit (INDEPENDENT_AMBULATORY_CARE_PROVIDER_SITE_OTHER): Payer: Medicare Other | Admitting: Family Medicine

## 2020-07-13 ENCOUNTER — Other Ambulatory Visit: Payer: Self-pay

## 2020-07-13 VITALS — BP 120/52 | HR 66 | Temp 96.9°F | Ht 67.0 in | Wt 141.0 lb

## 2020-07-13 DIAGNOSIS — G5603 Carpal tunnel syndrome, bilateral upper limbs: Secondary | ICD-10-CM | POA: Diagnosis not present

## 2020-07-13 DIAGNOSIS — I6930 Unspecified sequelae of cerebral infarction: Secondary | ICD-10-CM

## 2020-07-13 NOTE — Progress Notes (Signed)
Subjective:    Patient ID: Devin Valdez, male    DOB: [birth year], 79 y.o.   MRN: [id number]  HPI  02/14/20 Patient presents with bilateral hand pain left greater than right.  The pain is distal to the wrist both hands.  He reports pins-and-needles and burning sensations in both hands.  This primarily involves the thumb, index finger, and the third finger.  He also reports weakness in his hands.  There is no erythema or swelling in his MCP, PIP, or DIP joints.  He does have a positive Phalen sign.  He has a negative Tinel's sign.  He has normal grip strength.  There is no thenar eminence wasting.  He has no crepitus.  There is no decreased range of motion in his hands.  At that time, my plan was: I believe the pain in his hands is due to carpal tunnel syndrome bilaterally.  He is unable to tolerate NSAIDs due to his antiplatelet agent.  Patient agreed to a cortisone injection in his left wrist.  Using sterile technique, I injected 1 cc of lidocaine and 1 cc of 40 mg/mL Kenalog into the left carpal tunnel through the distal flexor crease at the wrist after aspirating to ensure that we are not injecting into a blood vessel.  Patient tolerated procedure well without complication.  If the pain improves by next week he could return for repeat injection in his right wrist.  07/13/20 Patient has a history of severe cervical stenosis status post ACDF in 2019.  I gave the patient a cortisone injection in his left carpal tunnel in March.  He saw no benefit.  He continues to report numbness tingling and pain in the first through fourth fingers on both hands.  He reports weakness in both hands.  He reports a burning stinging pain in both hands.  The pain, weakness, and numbness is all distal to the wrist.  Today he has a positive Tinel's sign.  Phalen sign is equivocal.  He has diminished grip strength in his right hand.  The remainder of the strength in both hands is normal.  He has diminished sensation in both  hands in the distribution of the median nerve although it is still present  Past Medical History:  Diagnosis Date  . Anemia   . Anxiety   . 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 Depression   . Falls   . Hallucinations   . Hyperlipidemia   . Hypertension   . Stroke [facility])    left paracentral pons infarct   Past Surgical History:  Procedure Laterality Date  . EYE SURGERY    . HERNIA REPAIR    . POSTERIOR CERVICAL LAMINECTOMY     with fusion C3-C7 10/19- Dr. Brigitte Pulse at [facility]   Current Outpatient Medications on File Prior to Visit  Medication Sig Dispense Refill  . acetaminophen (TYLENOL) 325 MG tablet Take 650 mg by mouth every 6 (six) hours as needed for mild pain or headache.    Marland Kitchen aspirin 81 MG chewable tablet Chew by mouth daily.    Marland Kitchen atorvastatin (LIPITOR) 40 MG tablet TAKE ONE TABLET (40MG  TOTAL) BY MOUTH DAILY AT 6PM 90 tablet 1  . Coenzyme Q10 (CO Q-10) 200 MG CAPS Take by mouth daily.    . ferrous sulfate 325 (65 FE) MG tablet Take 1 tablet (325 mg total) by mouth 2 (two) times daily with a meal. (Patient taking differently: Take 325 mg by mouth  daily with breakfast. ) 60 tablet 1  . losartan (COZAAR) 50 MG tablet TAKE ONE (1) TABLET BY MOUTH EVERY DAY FOR BLOOD PRESSURE (Patient taking differently: Take 50 mg by mouth daily. ) 90 tablet 3  . Multiple Vitamins-Minerals (CENTRUM SILVER ADULT 50+) TABS Take 1 capsule by mouth 2 (two) times daily. 30 tablet 2  . sildenafil (VIAGRA) 100 MG tablet TAKE ONE TABLET (100MG  TOTAL) BY MOUTH DAILY AS NEEDED FOR ERECTILE DYSFUNCTION (Patient taking differently: Take 100 mg by mouth as needed for erectile dysfunction. ) 10 tablet 5   No current facility-administered medications on file prior to visit.   No Known Allergies Social History   Socioeconomic History  . Marital status: Married    Spouse name: Not on file  . Number of children: Not on file  . Years of education: Not on file  . Highest education level:  Not on file  Occupational History  . Not on file  Tobacco Use  . Smoking status: Former Smoker    Quit date: 11/21/1985    Years since quitting: 34.6  . Smokeless tobacco: Never Used  Substance and Sexual Activity  . Alcohol use: No  . Drug use: No  . Sexual activity: Yes  Other Topics Concern  . Not on file  Social History Narrative  . Not on file   Social Determinants of Health   Financial Resource Strain:   . Difficulty of Paying Living Expenses: Not on file  Food Insecurity:   . Worried About Charity fundraiser in the Last Year: Not on file  . Ran Out of Food in the Last Year: Not on file  Transportation Needs:   . Lack of Transportation (Medical): Not on file  . Lack of Transportation (Non-Medical): Not on file  Physical Activity:   . Days of Exercise per Week: Not on file  . Minutes of Exercise per Session: Not on file  Stress:   . Feeling of Stress : Not on file  Social Connections:   . Frequency of Communication with Friends and Family: Not on file  . Frequency of Social Gatherings with Friends and Family: Not on file  . Attends Religious Services: Not on file  . Active Member of Clubs or Organizations: Not on file  . Attends Archivist Meetings: Not on file  . Marital Status: Not on file  Intimate Partner Violence:   . Fear of Current or Ex-Partner: Not on file  . Emotionally Abused: Not on file  . Physically Abused: Not on file  . Sexually Abused: Not on file    Review of Systems  All other systems reviewed and are negative.      Objective:   Physical Exam Constitutional:      Appearance: Normal appearance. He is normal weight.  Cardiovascular:     Rate and Rhythm: Normal rate and regular rhythm.     Heart sounds: Normal heart sounds. No murmur heard.  No friction rub. No gallop.   Pulmonary:     Effort: Pulmonary effort is normal. No respiratory distress.     Breath sounds: Normal breath sounds. No stridor. No wheezing, rhonchi or  rales.  Chest:     Chest wall: No tenderness.  Musculoskeletal:     Right hand: No swelling, deformity, lacerations, tenderness or bony tenderness. Normal range of motion. Decreased sensation of the median distribution. Normal pulse.     Left hand: No swelling, deformity, lacerations, tenderness or bony tenderness. Normal range of motion.  Normal strength. Decreased sensation of the median distribution. Normal pulse.     Right lower leg: No edema.     Left lower leg: No edema.  Neurological:     Mental Status: He is alert.           Assessment & Plan:  Bilateral carpal tunnel syndrome - Plan: Nerve conduction test  I suspect severe carpal tunnel syndrome.  Recommended nerve conduction studies given the fact that he does have a history of cervical stenosis to ensure that this is not referred/cervical radiculopathy.  If nerve conduction studies confirm carpal tunnel syndrome, I would recommend a hand specialist consultation to discuss surgical options given the fact he has failed cortisone injections

## 2020-07-22 ENCOUNTER — Telehealth: Payer: Self-pay

## 2020-07-22 DIAGNOSIS — G5603 Carpal tunnel syndrome, bilateral upper limbs: Secondary | ICD-10-CM

## 2020-07-23 NOTE — Telephone Encounter (Signed)
error 

## 2020-07-29 ENCOUNTER — Other Ambulatory Visit: Payer: Self-pay

## 2020-07-29 ENCOUNTER — Ambulatory Visit (INDEPENDENT_AMBULATORY_CARE_PROVIDER_SITE_OTHER): Payer: Medicare Other | Admitting: Ophthalmology

## 2020-07-29 ENCOUNTER — Encounter (INDEPENDENT_AMBULATORY_CARE_PROVIDER_SITE_OTHER): Payer: Self-pay | Admitting: Ophthalmology

## 2020-07-29 DIAGNOSIS — H34812 Central retinal vein occlusion, left eye, with macular edema: Secondary | ICD-10-CM

## 2020-07-29 MED ORDER — BEVACIZUMAB CHEMO INJECTION 1.25MG/0.05ML SYRINGE FOR KALEIDOSCOPE
1.2500 mg | INTRAVITREAL | Status: AC | PRN
  Administered 2020-07-29: 1.25 mg via INTRAVITREAL

## 2020-07-29 NOTE — Progress Notes (Signed)
07/29/2020     CHIEF COMPLAINT Patient presents for Retina Follow Up   HISTORY OF PRESENT ILLNESS: Devin Valdez is a 79 y.o. male who presents to the clinic today for:   HPI    Retina Follow Up    Patient presents with  Other.  In left eye.  This started 6 weeks ago.  Severity is mild.  Duration of 6 weeks.  Since onset it is stable.          Comments    6 Week RVO F/U OS, poss Avastin OS  Pt denies noticeable changes to [place] OU since last visit. Pt denies ocular pain, flashes of light, or floaters OU.         Last edited by Rockie Neighbours, [place] on 07/29/2020  9:57 AM. (History)      Referring physician: [name], MD [facility] ([facility]) [address] [place],  [place] [address]  HISTORICAL INFORMATION:   Selected notes from the MEDICAL RECORD NUMBER    Lab Results  Component Value Date   HGBA1C 4.6 (L) 01/14/2020     CURRENT MEDICATIONS: No current outpatient medications on file. (Ophthalmic Drugs)   No current facility-administered medications for this visit. (Ophthalmic Drugs)   Current Outpatient Medications (Other)  Medication Sig  . acetaminophen (TYLENOL) 325 MG tablet Take 650 mg by mouth every 6 (six) hours as needed for mild pain or headache.  Marland Kitchen aspirin 81 MG chewable tablet Chew by mouth daily.  Marland Kitchen atorvastatin (LIPITOR) 40 MG tablet TAKE ONE TABLET (40MG  TOTAL) BY MOUTH DAILY AT 6PM  . Coenzyme Q10 (CO Q-10) 200 MG CAPS Take by mouth daily.  . ferrous sulfate 325 (65 FE) MG tablet Take 1 tablet (325 mg total) by mouth 2 (two) times daily with a meal. (Patient taking differently: Take 325 mg by mouth daily with breakfast. )  . losartan (COZAAR) 50 MG tablet TAKE ONE (1) TABLET BY MOUTH EVERY DAY FOR BLOOD PRESSURE (Patient taking differently: Take 50 mg by mouth daily. )  . Multiple Vitamins-Minerals (CENTRUM SILVER ADULT 50+) TABS Take 1 capsule by mouth 2 (two) times daily.  . sildenafil (VIAGRA) 100 MG tablet TAKE ONE TABLET (100MG  TOTAL) BY MOUTH DAILY  AS NEEDED FOR ERECTILE DYSFUNCTION (Patient taking differently: Take 100 mg by mouth as needed for erectile dysfunction. )   No current facility-administered medications for this visit. (Other)      REVIEW OF SYSTEMS:    ALLERGIES No Known Allergies  PAST MEDICAL HISTORY Past Medical History:  Diagnosis Date  . Anemia   . Anxiety   . 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 Depression   . Falls   . Hallucinations   . Hyperlipidemia   . Hypertension   . Stroke [facility])    left paracentral pons infarct   Past Surgical History:  Procedure Laterality Date  . EYE SURGERY    . HERNIA REPAIR    . POSTERIOR CERVICAL LAMINECTOMY     with fusion C3-C7 10/19- Dr. Brigitte Pulse at [facility] Family History  Problem Relation Age of Onset  . Multiple sclerosis Son   . Bipolar disorder Son     SOCIAL HISTORY Social History   Tobacco Use  . Smoking status: Former Smoker    Quit date: 11/21/1985    Years since quitting: 34.7  . Smokeless tobacco: Never Used  Substance Use Topics  . Alcohol use: No  .  Drug use: No         OPHTHALMIC EXAM:  Base Eye Exam    Visual Acuity (ETDRS)      Right Left   Dist cc 20/30 -1 20/200   Dist ph cc 20/30 +1 20/70 -2   Correction: Glasses       Tonometry (Tonopen, 9:58 AM)      Right Left   Pressure 14 12       Pupils      React APD   Right Slow None   Left Slow None       Visual Fields (Counting fingers)      Left Right    Full Full       Extraocular Movement      Right Left    Full Full       Neuro/Psych    Oriented x3: Yes   Mood/Affect: Normal       Dilation    Left eye: 1.0% Mydriacyl, 2.5% Phenylephrine @ 10:02 AM        Slit Lamp and Fundus Exam    External Exam      Right Left   External Normal Normal       Slit Lamp Exam      Right Left   Lids/Lashes Normal Normal   Conjunctiva/Sclera White and quiet White and quiet   Cornea Clear Clear   Anterior Chamber Deep and  quiet Deep and quiet   Iris Round and reactive Round and reactive   Lens Posterior chamber intraocular lens Posterior chamber intraocular lens   Anterior Vitreous Normal Normal       Fundus Exam      Right Left   Posterior Vitreous  Normal   Disc  Early collaterals   C/D Ratio 0.3 0.25   Macula  Macular thickening, Microaneurysms, less Cystoid macular edema,  Intraretinal hemorrhage, large subfoveal druse   Vessels  Hemiretinal vein occlusion superiorly.   Periphery  Normal          IMAGING AND PROCEDURES  Imaging and Procedures for 07/29/20  OCT, Retina - OU - Both Eyes       Right Eye Quality was good. Scan locations included subfoveal. Central Foveal Thickness: 275. Findings include retinal drusen , no SRF, no IRF.   Left Eye Quality was good. Scan locations included subfoveal. Central Foveal Thickness: 284. Findings include vitreomacular adhesion , abnormal foveal contour, subretinal hyper-reflective material, subretinal scarring.   Notes Much less retinal thickening superior portion of the macula from hemispheric central vein occlusion on therapy With intravitreal Avastin since May 2021.  We will repeat today and extend interval examination 7 weeks       Intravitreal Injection, Pharmacologic Agent - OS - Left Eye       Time Out 07/29/2020. 11:22 AM. Confirmed correct patient, procedure, site, and patient consented.   Anesthesia Topical anesthesia was used. Anesthetic medications included Akten 3.5%.   Procedure Preparation included Ofloxacin , 10% betadine to eyelids, 5% betadine to ocular surface. A supplied needle was used.   Injection:  1.25 mg Bevacizumab (AVASTIN) SOLN   NDC: [address]-3, Lot: [id number]   Route: Intravitreal, Site: Left Eye, Waste: 0 mg  Post-op Post injection exam found visual acuity of at least counting fingers. The patient tolerated the procedure well. There were no complications. The patient received written and verbal post procedure  care education. Post injection medications were not given.  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  Subfoveal scarring appears to be limiting the quality of the vision With loss of the  photo receptors layers      ICD-10-CM   1. 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  Repeat injection intravitreal Avastin OS today at 6-week interval for CME secondary to BRVO, improving nicely.  2.  3.  Ophthalmic Meds Ordered this visit:  Meds ordered this encounter  Medications  . Bevacizumab (AVASTIN) SOLN 1.25 mg       Return in about 7 weeks (around 09/16/2020) for dilate, OS, AVASTIN OCT.  Patient Instructions  Patient to contact the office promptly if new visual acuity declines or difficulties arise in either eye    Explained the diagnoses, plan, and follow up with the patient and they expressed understanding.  Patient expressed understanding of the importance of proper follow up care.   Clent Demark Lakyla Biswas M.D. Diseases & Surgery of the Retina and Vitreous [facility] [place] 07/29/20     Abbreviations: M myopia (nearsighted); A astigmatism; H hyperopia (farsighted); P presbyopia; Mrx spectacle prescription;  CTL contact lenses; OD right eye; OS left eye; OU both eyes  XT exotropia; ET esotropia; PEK punctate epithelial keratitis; PEE punctate epithelial erosions; DES dry eye syndrome; MGD meibomian gland dysfunction; ATs artificial tears; PFAT's preservative free artificial tears; Leonard nuclear sclerotic cataract; PSC posterior subcapsular cataract; ERM epi-retinal membrane; PVD posterior vitreous detachment; RD retinal detachment; DM  diabetes mellitus; DR diabetic retinopathy; NPDR non-proliferative diabetic retinopathy; PDR proliferative diabetic retinopathy; CSME clinically significant macular edema; DME diabetic macular edema; dbh dot blot hemorrhages; CWS cotton wool spot; POAG primary open angle glaucoma; C/D cup-to-disc ratio; HVF humphrey visual field; GVF goldmann visual field; OCT optical coherence tomography; IOP intraocular pressure; BRVO Branch retinal vein occlusion; CRVO central retinal vein occlusion; CRAO central retinal artery occlusion; BRAO branch retinal artery occlusion; RT retinal tear; SB scleral buckle; PPV pars plana vitrectomy; VH Vitreous hemorrhage; PRP panretinal laser photocoagulation; IVK intravitreal kenalog; VMT vitreomacular traction; MH Macular hole;  NVD neovascularization of the disc; NVE neovascularization elsewhere; AREDS age related eye disease study; ARMD age related macular degeneration; POAG primary open angle glaucoma; EBMD epithelial/anterior basement membrane dystrophy; ACIOL anterior chamber intraocular lens; IOL intraocular lens; PCIOL posterior chamber intraocular lens; Phaco/IOL phacoemulsification with intraocular lens placement; [place] photorefractive keratectomy; LASIK laser assisted in situ keratomileusis; HTN hypertension; DM diabetes mellitus; COPD chronic obstructive pulmonary disease

## 2020-07-29 NOTE — Assessment & Plan Note (Signed)
Much less active much less active CRV O when CME on therapy with intravitreal Avastin at 6-week interval.  Subfoveal scarring appears to be limiting the quality of the vision With loss of the  photo receptors layers

## 2020-07-29 NOTE — Patient Instructions (Signed)
Patient to contact the office promptly if new visual acuity declines or difficulties arise in either eye

## 2020-08-17 ENCOUNTER — Ambulatory Visit: Payer: Medicare Other | Admitting: Adult Health

## 2020-08-25 ENCOUNTER — Ambulatory Visit (INDEPENDENT_AMBULATORY_CARE_PROVIDER_SITE_OTHER): Payer: Medicare Other | Admitting: Neurology

## 2020-08-25 ENCOUNTER — Encounter: Payer: Self-pay | Admitting: Neurology

## 2020-08-25 VITALS — BP 125/68 | HR 75 | Ht 67.0 in | Wt 147.0 lb

## 2020-08-25 DIAGNOSIS — G959 Disease of spinal cord, unspecified: Secondary | ICD-10-CM

## 2020-08-25 DIAGNOSIS — R202 Paresthesia of skin: Secondary | ICD-10-CM | POA: Diagnosis not present

## 2020-08-25 DIAGNOSIS — I639 Cerebral infarction, unspecified: Secondary | ICD-10-CM | POA: Insufficient documentation

## 2020-08-25 MED ORDER — GABAPENTIN 100 MG PO CAPS: 100.0000 mg | ORAL_CAPSULE | Freq: Three times a day (TID) | ORAL | 3 refills | Status: DC

## 2020-08-25 NOTE — Progress Notes (Signed)
Chief Complaint  Patient presents with  . New Patient (Initial Visit)    Referred for NCV/EMG to evaluate possible carpal tunnel syndrome. Reports pins & needles in his bilateral hands, left side worse than right.   Marland Kitchen PCP    [name], MD    HISTORICAL  Devin Valdez is a 79 year old male, seen in request by primary care physician Dr. Jenna Luo for evaluation of possible bilateral carpal tunnel syndromes, initial evaluation was on August 25, 2020  I reviewed and summarized the referring note.  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, C4-5 severe canal stenosis, cord compression with cord edema, C5-6 severe canal stenosis and cord compression without cord edema, C6-7, moderate canal stenosis with cord impingement, severe right C5-6, bilateral C6-7 foraminal stenosis, multilevel mild to moderate foraminal stenosis,  At baseline, he has right arm and leg weakness, mild gait abnormality,  I personally reviewed MRI of the brain without contrast in February 2021, acute subacute infarction at the left pons, mild interval increase in size, compared to January 2021, suggest interval expansion,  He complains of worsening bilateral hands numbness, weakness, difficulty holding tight of his cane, gradually getting worse since 2021  REVIEW OF SYSTEMS: Full 14 system review of systems performed and notable only for as above All other review of systems were negative.  ALLERGIES: No Known Allergies  HOME MEDICATIONS: Current Outpatient  Medications  Medication Sig Dispense Refill  . acetaminophen (TYLENOL) 325 MG tablet Take 650 mg by mouth every 6 (six) hours as needed for mild pain or headache.    Marland Kitchen aspirin 81 MG chewable tablet Chew by mouth daily.    Marland Kitchen atorvastatin (LIPITOR) 40 MG tablet TAKE ONE TABLET (40MG  TOTAL) BY MOUTH DAILY AT 6PM 90 tablet 1  . Coenzyme Q10 (CO Q-10) 200 MG CAPS Take by mouth daily.    . ferrous sulfate 325 (65 FE) MG tablet Take 1 tablet (325 mg total) by mouth 2 (two) times daily with a meal. (Patient taking differently: Take 325 mg by mouth daily with breakfast. ) 60 tablet 1  . losartan (COZAAR) 50 MG tablet TAKE ONE (1) TABLET BY MOUTH EVERY DAY FOR BLOOD PRESSURE (Patient taking differently: Take 50 mg by mouth daily. ) 90 tablet 3  . Multiple Vitamins-Minerals (CENTRUM SILVER ADULT 50+) TABS Take 1 capsule by mouth 2 (two) times daily. 30 tablet 2  . sildenafil (VIAGRA) 100 MG tablet TAKE ONE TABLET (100MG  TOTAL) BY MOUTH DAILY AS NEEDED FOR ERECTILE DYSFUNCTION (Patient taking differently: Take 100 mg by mouth as needed for erectile dysfunction. ) 10 tablet 5   No current facility-administered medications for this visit.    PAST MEDICAL HISTORY: Past Medical History:  Diagnosis Date  . Anemia   . Anxiety   . 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 Depression   . Falls   . Hallucinations   . Hyperlipidemia   . Hypertension   . Stroke ([place])    left paracentral pons infarct    PAST SURGICAL HISTORY: Past Surgical History:  Procedure Laterality Date  .  EYE SURGERY    . HERNIA REPAIR    . POSTERIOR CERVICAL LAMINECTOMY     with fusion C3-C7 10/19- Dr. Brigitte Pulse at [facility]: Family History  Problem Relation Age of Onset  . Multiple sclerosis Son   . Bipolar disorder Son     SOCIAL HISTORY: Social History   Socioeconomic History  . Marital status: Married    Spouse name: Not on file  . Number of children: Not on file  . Years of  education: Not on file  . Highest education level: Not on file  Occupational History  . Not on file  Tobacco Use  . Smoking status: Former Smoker    Quit date: 11/21/1985    Years since quitting: 34.7  . Smokeless tobacco: Never Used  Substance and Sexual Activity  . Alcohol use: No  . Drug use: No  . Sexual activity: Yes  Other Topics Concern  . Not on file  Social History Narrative  . Not on file   Social Determinants of Health   Financial Resource Strain:   . Difficulty of Paying Living Expenses: Not on file  Food Insecurity:   . Worried About Charity fundraiser in the Last Year: Not on file  . Ran Out of Food in the Last Year: Not on file  Transportation Needs:   . Lack of Transportation (Medical): Not on file  . Lack of Transportation (Non-Medical): Not on file  Physical Activity:   . Days of Exercise per Week: Not on file  . Minutes of Exercise per Session: Not on file  Stress:   . Feeling of Stress : Not on file  Social Connections:   . Frequency of Communication with Friends and Family: Not on file  . Frequency of Social Gatherings with Friends and Family: Not on file  . Attends Religious Services: Not on file  . Active Member of Clubs or Organizations: Not on file  . Attends Archivist Meetings: Not on file  . Marital Status: Not on file  Intimate Partner Violence:   . Fear of Current or Ex-Partner: Not on file  . Emotionally Abused: Not on file  . Physically Abused: Not on file  . Sexually Abused: Not on file     PHYSICAL EXAM   Vitals:   08/25/20 0739  BP: 125/68  Pulse: 75  Weight: 147 lb (66.7 kg)  Height: 5\' 7"  (1.702 m)   Not recorded     Body mass index is 23.02 kg/m.  PHYSICAL EXAMNIATION:  Gen: NAD, conversant, well nourised, well groomed                     Cardiovascular: Regular rate rhythm, no peripheral edema, warm, nontender. Eyes: Conjunctivae clear without exudates or hemorrhage Neck: Supple, no carotid  bruits. Pulmonary: Clear to auscultation bilaterally   NEUROLOGICAL EXAM:  MENTAL STATUS: Speech:    Speech is normal; fluent and spontaneous with normal comprehension.  Cognition:     Orientation to time, place and person     Normal recent and remote memory     Normal Attention span and concentration     Normal Language, naming, repeating,spontaneous speech     Fund of knowledge   CRANIAL NERVES: CN II: Visual fields are full to confrontation. Pupils are round equal and briskly reactive to light. CN III, IV, VI: extraocular movement are normal. No ptosis. CN V: Facial sensation is intact to light touch CN VII: Face  is symmetric with normal eye closure  CN VIII: Hearing is normal to causal conversation. CN IX, X: Phonation is normal. CN XI: Head turning and shoulder shrug are intact  MOTOR: Mild bilateral hand grip weakness, no significant bilateral upper lower extremity proximal muscle weakness  REFLEXES: Reflexes are 2+ and symmetric at the biceps, triceps, knees, and ankles. Plantar responses are flexor.  SENSORY: Intact to light touch, pinprick and vibratory sensation are intact in fingers and toes.  COORDINATION: There is no trunk or limb dysmetria noted.  GAIT/STANCE: Gait push-up to get up from seated position, cautious, dragging right leg, decreased right arm swing  DIAGNOSTIC DATA (LABS, IMAGING, TESTING) - I reviewed patient records, labs, notes, testing and imaging myself where available.   ASSESSMENT AND PLAN  Devin Valdez is a 79 y.o. male   Bilateral paresthesia and weakness  Residual deficit from cervical myelopathy, versus previous left pontine stroke  EMG nerve conduction study  Gabapentin 100mg  tid.      Marcial Pacas, M.D. Ph.D.  [facility] [address] [place], [place] [address] Ph: [phone] Fax: [phone]  CC:  [name], [place] [address] [place],  [place] [address]

## 2020-09-09 ENCOUNTER — Ambulatory Visit (INDEPENDENT_AMBULATORY_CARE_PROVIDER_SITE_OTHER): Payer: Medicare Other | Admitting: Neurology

## 2020-09-09 ENCOUNTER — Other Ambulatory Visit: Payer: Self-pay

## 2020-09-09 DIAGNOSIS — I639 Cerebral infarction, unspecified: Secondary | ICD-10-CM

## 2020-09-09 DIAGNOSIS — R202 Paresthesia of skin: Secondary | ICD-10-CM

## 2020-09-09 DIAGNOSIS — I6389 Other cerebral infarction: Secondary | ICD-10-CM | POA: Diagnosis not present

## 2020-09-09 DIAGNOSIS — G959 Disease of spinal cord, unspecified: Secondary | ICD-10-CM

## 2020-09-09 NOTE — Progress Notes (Signed)
No chief complaint on file.   HISTORICAL  Devin Valdez is a 79 year old male, seen in request by primary care physician Dr. Jenna Luo for evaluation of possible bilateral carpal tunnel syndromes, initial evaluation was on August 25, 2020  I reviewed and summarized the referring note.  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, C4-5 severe canal stenosis, cord compression with cord edema, C5-6 severe canal stenosis and cord compression without cord edema, C6-7, moderate canal stenosis with cord impingement, severe right C5-6, bilateral C6-7 foraminal stenosis, multilevel mild to moderate foraminal stenosis,  At baseline, he has right arm and leg weakness, mild gait abnormality,  I personally reviewed MRI of the brain without contrast in February 2021, acute subacute infarction at the left pons, mild interval increase in size, compared to January 2021, suggest interval expansion,  He complains of worsening bilateral hands numbness, weakness, difficulty holding tight of his cane, gradually getting worse since 2021  UPDATE Sep 09 2020: He is accompanied by his wife to return for electrodiagnostic study today, which showed chronic bilateral cervical radiculopathy, but there was no significant focal neuropathy  His right arm weakness bilateral hands paresthesia is most likely from residual deficit of previous left pontine stroke, cervical radiculopathy/myelopathy   REVIEW OF SYSTEMS: Full 14 system review of systems performed and notable  only for as above All other review of systems were negative.  ALLERGIES: No Known Allergies  HOME MEDICATIONS: Current Outpatient Medications  Medication Sig Dispense Refill  . acetaminophen (TYLENOL) 325 MG tablet Take 650 mg by mouth every 6 (six) hours as needed for mild pain or headache.    Marland Kitchen aspirin 81 MG chewable tablet Chew by mouth daily.    Marland Kitchen atorvastatin (LIPITOR) 40 MG tablet TAKE ONE TABLET (40MG  TOTAL) BY MOUTH DAILY AT 6PM 90 tablet 1  . Coenzyme Q10 (CO Q-10) 200 MG CAPS Take by mouth daily.    . ferrous sulfate 325 (65 FE) MG tablet Take 1 tablet (325 mg total) by mouth 2 (two) times daily with a meal. (Patient taking differently: Take 325 mg by mouth daily with breakfast. ) 60 tablet 1  . gabapentin (NEURONTIN) 100 MG capsule Take 1 capsule (100 mg total) by mouth 3 (three) times daily. 90 capsule 3  . losartan (COZAAR) 50 MG tablet TAKE ONE (1) TABLET BY MOUTH EVERY DAY FOR BLOOD PRESSURE (Patient taking differently: Take 50 mg by mouth daily. ) 90 tablet 3  . Multiple Vitamins-Minerals (CENTRUM SILVER ADULT 50+) TABS Take 1 capsule by mouth 2 (two) times daily. 30 tablet 2  . sildenafil (VIAGRA) 100 MG tablet TAKE ONE TABLET (100MG  TOTAL) BY MOUTH DAILY AS NEEDED FOR ERECTILE DYSFUNCTION (Patient taking differently: Take 100 mg by mouth as needed for erectile dysfunction. ) 10 tablet 5   No current facility-administered medications for this visit.    PAST MEDICAL HISTORY: Past Medical History:  Diagnosis Date  . Anemia   . Anxiety   . 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 Depression   . Falls   . Hallucinations   .  Hyperlipidemia   . Hypertension   . Stroke ([place])    left paracentral pons infarct    PAST SURGICAL HISTORY: Past Surgical History:  Procedure Laterality Date  . EYE SURGERY    . HERNIA REPAIR    . POSTERIOR CERVICAL LAMINECTOMY     with fusion C3-C7 10/19- Dr. Brigitte Pulse at [place]: Family History  Problem  Relation Age of Onset  . Multiple sclerosis Son   . Bipolar disorder Son     SOCIAL HISTORY: Social History   Socioeconomic History  . Marital status: Married    Spouse name: Not on file  . Number of children: Not on file  . Years of education: Not on file  . Highest education level: Not on file  Occupational History  . Not on file  Tobacco Use  . Smoking status: Former Smoker    Quit date: 11/21/1985    Years since quitting: 34.8  . Smokeless tobacco: Never Used  Substance and Sexual Activity  . Alcohol use: No  . Drug use: No  . Sexual activity: Yes  Other Topics Concern  . Not on file  Social History Narrative  . Not on file   Social Determinants of Health   Financial Resource Strain:   . Difficulty of Paying Living Expenses: Not on file  Food Insecurity:   . Worried About Charity fundraiser in the Last Year: Not on file  . Ran Out of Food in the Last Year: Not on file  Transportation Needs:   . Lack of Transportation (Medical): Not on file  . Lack of Transportation (Non-Medical): Not on file  Physical Activity:   . Days of Exercise per Week: Not on file  . Minutes of Exercise per Session: Not on file  Stress:   . Feeling of Stress : Not on file  Social Connections:   . Frequency of Communication with Friends and Family: Not on file  . Frequency of Social Gatherings with Friends and Family: Not on file  . Attends Religious Services: Not on file  . Active Member of Clubs or Organizations: Not on file  . Attends Archivist Meetings: Not on file  . Marital Status: Not on file  Intimate Partner Violence:   . Fear of Current or Ex-Partner: Not on file  . Emotionally Abused: Not on file  . Physically Abused: Not on file  . Sexually Abused: Not on file     PHYSICAL EXAM   There were no vitals filed for this visit. Not recorded     There is no height or weight on file to calculate BMI.  PHYSICAL EXAMNIATION:  Gen: NAD, conversant, well  nourised, well groomed NEUROLOGICAL EXAM:  MENTAL STATUS: Speech/cognition: Awake, alert, oriented to history taking and casual conversation   CRANIAL NERVES: CN II: Visual fields are full to confrontation. Pupils are round equal and briskly reactive to light. CN III, IV, VI: extraocular movement are normal. No ptosis. CN V: Facial sensation is intact to light touch CN VII: Face is symmetric with normal eye closure  CN VIII: Hearing is normal to causal conversation. CN IX, X: Phonation is normal. CN XI: Head turning and shoulder shrug are intact  MOTOR: Mild right arm proximal muscle weakness, fixation of right arm upon rapid rotation, mild right lower extremity drift  REFLEXES: Hyperreflexia at right upper and lower extremity muscles, right-sided Babinski sign  SENSORY: Intact to light touch, pinprick and vibratory sensation are intact in fingers  and toes.  COORDINATION: There is no trunk or limb dysmetria noted.  GAIT/STANCE: Gait push-up to get up from seated position, cautious, dragging right leg, decreased right arm swing  DIAGNOSTIC DATA (LABS, IMAGING, TESTING) - I reviewed patient records, labs, notes, testing and imaging myself where available.   ASSESSMENT AND PLAN  Parish P Cass is a 79 y.o. male   Bilateral paresthesia and weakness  Most likely residual deficit from previous left pontine stroke, cervical myelopathy/radiculopathy,  EMG nerve conduction study today showed chronic bilateral cervical radiculopathy, involving bilateral C5, 6, 7, T1 myotomes, there is no obvious focal neuropathy  Will refer him to occupational therapy  Marcial Pacas, M.D. Ph.D.  [facility] [address], [place], [place] [address] Ph: [phone] Fax: [phone]  CC:  [name], [place] [address] [place],  [place] [address]

## 2020-09-09 NOTE — Procedures (Signed)
Full Name: Devin Valdez Gender: Male MRN #: [id number] Date of Birth: [birth year]    Visit Date: 09/09/2020 07:27 Age: 79 Years Examining Physician: Marcial Pacas, MD  Referring Physician: Marcial Pacas, MD; Jenna Luo, MD Height: 5 feet 7 inch History: 79 year old male, presented with bilateral upper extremity paresthesia, history of left pontine stroke, cervical decompression surgery  Summary of the test: Nerve conduction study: Left sural, superficial peroneal, bilateral median, right ulnar, radial sensory responses were normal.  Left ulnar sensory response showed mildly prolonged peak latency, with mildly decreased snap amplitude.  Left tibial motor response showed mildly decreased CMAP amplitude, with slow conduction velocity.  Left peroneal to EDB motor responses showed mildly prolonged distal latency, with moderately decreased CMAP amplitude, slow conduction velocity.  Bilateral median motor responses were normal.  Bilateral ulnar motor responses showed mildly prolonged distal latency,  Electromyography:  Selected needle examination of bilateral upper extremity muscles, and bilateral cervical paraspinal muscles were performed.  There was evidence of chronic neuropathic changes involving bilateral C5, 6, 7, T1 myotomes.   Conclusion: This is an abnormal study.  There is electrodiagnostic evidence of chronic bilateral cervical radiculopathy, involving bilateral C5, 6, 7, T1 myotomes.  There is no evidence of focal neuropathy.  The mild abnormal parameters can be related to aging process and the cold limb temperature.    ------------------------------- Marcial Pacas, M.D. PhD  [facility] [address], [place] [address] Tel: [phone] Fax: [phone]  Verbal informed consent was obtained from the patient, patient was informed of potential risk of procedure, including bruising, bleeding, hematoma formation, infection, muscle weakness,  muscle pain, numbness, among others.         [place]    Nerve / Sites Muscle Latency Ref. Amplitude Ref. Rel Amp Segments Distance Velocity Ref. Area    ms ms mV mV %  cm m/s m/s mVms  L Median - APB     Wrist APB 4.0 ?4.4 4.9 ?4.0 100 Wrist - APB 7   21.2     Upper arm APB 9.1  6.1  124 Upper arm - Wrist 25 49 ?49 19.2  R Median - APB     Wrist APB 4.1 ?4.4 5.5 ?4.0 100 Wrist - APB 7   23.0     Upper arm APB 9.2  5.1  92.8 Upper arm - Wrist 25 49 ?49 23.2  L Ulnar - ADM     Wrist ADM 3.9 ?3.3 4.4 ?6.0 100 Wrist - ADM 7   9.2     B.Elbow ADM 8.0  8.0  181 B.Elbow - Wrist 21 51 ?49 27.3     A.Elbow ADM 10.0  7.9  98.7 A.Elbow - B.Elbow 10 49 ?49 28.7     Median wrist ADM 3.3  0.2  2.39 A.Elbow - Wrist    0.8         Median wrist - A.Elbow      R Ulnar - ADM     Wrist ADM 3.7 ?3.3 9.0 ?6.0 100 Wrist - ADM 7   38.3     B.Elbow ADM 7.8  6.3  70.5 B.Elbow - Wrist 20 49 ?49 30.5     A.Elbow ADM 10.5  6.3  100 A.Elbow - B.Elbow 10 36 ?49 32.7         A.Elbow - Wrist      L Peroneal - EDB     Ankle EDB 6.6 ?6.5 0.9 ?2.0 100 Ankle - EDB  9   3.2     Fib head EDB 13.8  0.8  83.9 Fib head - Ankle 29 40 ?44 2.6     Pop fossa EDB 16.6  0.7  87.2 Pop fossa - Fib head 10 36 ?44 2.5         Pop fossa - Ankle      L Tibial - AH     Ankle AH 4.5 ?5.8 2.6 ?4.0 100 Ankle - AH 9   5.0     Pop fossa AH 16.2  3.2  125 Pop fossa - Ankle 36 31 ?41 8.9                    SNC    Nerve / Sites Rec. Site Peak Lat Ref.  Amp Ref. Segments Distance Peak Diff Ref.    ms ms V V  cm ms ms  L Radial - Anatomical snuff box (Forearm)     Forearm Wrist 2.7 ?2.9 26 ?15 Forearm - Wrist 10    L Sural - Ankle (Calf)     Calf Ankle 4.2 ?4.4 10 ?6 Calf - Ankle 14    L Superficial peroneal - Ankle     Lat leg Ankle 4.0 ?4.4 7 ?6 Lat leg - Ankle 14    L Median, Ulnar - Transcarpal comparison     Median Palm Wrist 2.1 ?2.2 74 ?35 Median Palm - Wrist 8       Ulnar Palm Wrist 2.3 ?2.2 14 ?12 Ulnar Palm - Wrist 8           Median Palm - Ulnar Palm  -0.2 ?0.4  R Median, Ulnar - Transcarpal comparison     Median Palm Wrist 2.3 ?2.2 61 ?35 Median Palm - Wrist 8       Ulnar Palm Wrist 2.3 ?2.2 24 ?12 Ulnar Palm - Wrist 8          Median Palm - Ulnar Palm  0.0 ?0.4  L Median - Orthodromic (Dig II, Mid palm)     Dig II Wrist 3.3 ?3.4 15 ?10 Dig II - Wrist 13    R Median - Orthodromic (Dig II, Mid palm)     Dig II Wrist 3.4 ?3.4 11 ?10 Dig II - Wrist 13    L Ulnar - Orthodromic, (Dig V, Mid palm)     Dig V Wrist 3.4 ?3.1 3 ?5 Dig V - Wrist 11    R Ulnar - Orthodromic, (Dig V, Mid palm)     Dig V Wrist 3.1 ?3.1 8 ?5 Dig V - Wrist 58                           F  Wave    Nerve F Lat Ref.   ms ms  L Ulnar - ADM 33.3 ?32.0  L Peroneal - EDB 60.1 ?56.0  L Tibial - AH 57.7 ?56.0  R Median - APB 31.7 ?31.0  R Ulnar - ADM 33.8 ?32.0               EMG Summary Table    Spontaneous MUAP Recruitment  Muscle IA Fib PSW Fasc Other Amp Dur. Poly Pattern  L. First dorsal interosseous Normal None None None _______ Normal Normal Normal Reduced  L. Pronator teres Normal None None None _______ Normal Normal Normal Reduced  L. Biceps brachii Normal None None None _______ Normal Normal Normal Reduced  L. Deltoid Normal  None None None _______ Normal Normal Normal Reduced  L. Cervical paraspinals Increased None None None _______ Normal Normal Normal Normal  R. First dorsal interosseous Normal None None None _______ Normal Normal Normal Reduced  R. Pronator teres Normal None None None _______ Normal Normal Normal Reduced  R. Biceps brachii Normal None None None _______ Normal Normal Normal Reduced  R. Deltoid Normal None None None _______ Normal Normal Normal Reduced  R. Triceps brachii Normal None None None _______ Normal Normal Normal Reduced  R. Cervical paraspinals Normal None None None _______ Normal Normal Normal Normal  L. Triceps brachii Normal None None None _______ Normal Normal Normal Reduced

## 2020-09-10 NOTE — Progress Notes (Signed)
Notify patient the numbness in his hands appears to be due to nerve damage in his neck.

## 2020-09-16 ENCOUNTER — Ambulatory Visit (INDEPENDENT_AMBULATORY_CARE_PROVIDER_SITE_OTHER): Payer: Medicare Other | Admitting: Ophthalmology

## 2020-09-16 ENCOUNTER — Other Ambulatory Visit: Payer: Self-pay | Admitting: Family Medicine

## 2020-09-16 ENCOUNTER — Encounter (INDEPENDENT_AMBULATORY_CARE_PROVIDER_SITE_OTHER): Payer: Self-pay | Admitting: Ophthalmology

## 2020-09-16 ENCOUNTER — Other Ambulatory Visit: Payer: Self-pay

## 2020-09-16 DIAGNOSIS — H34812 Central retinal vein occlusion, left eye, with macular edema: Secondary | ICD-10-CM

## 2020-09-16 DIAGNOSIS — H43822 Vitreomacular adhesion, left eye: Secondary | ICD-10-CM

## 2020-09-16 MED ORDER — BEVACIZUMAB CHEMO INJECTION 1.25MG/0.05ML SYRINGE FOR KALEIDOSCOPE
1.2500 mg | INTRAVITREAL | Status: AC | PRN
  Administered 2020-09-16: 1.25 mg via INTRAVITREAL

## 2020-09-16 NOTE — Assessment & Plan Note (Addendum)
Vastly improved on therapy, 7-week interval, repeat intravitreal Avastin today

## 2020-09-16 NOTE — Progress Notes (Signed)
09/16/2020     CHIEF COMPLAINT Patient presents for Retina Follow Up   HISTORY OF PRESENT ILLNESS: Devin Valdez is a 79 y.o. male who presents to the clinic today for:   HPI    Retina Follow Up    Patient presents with  Other.  In left eye.  This started 7 weeks ago.  Severity is moderate.  Duration of 7 weeks.  Since onset it is gradually improving.          Comments    7 Week F/U OS, poss Avastin OS  Pt sts VA OS is improving and clearing up. Pt denies new symptoms OU. Pt reports stable floaters off and on OU.       Last edited by Rockie Neighbours, [place] on 09/16/2020 10:28 AM. (History)      Referring physician: [name], MD [id number] [facility] [address] [place],  [place] [address]  HISTORICAL INFORMATION:   Selected notes from the MEDICAL RECORD NUMBER    Lab Results  Component Value Date   HGBA1C 4.6 (L) 01/14/2020     CURRENT MEDICATIONS: No current outpatient medications on file. (Ophthalmic Drugs)   No current facility-administered medications for this visit. (Ophthalmic Drugs)   Current Outpatient Medications (Other)  Medication Sig  . acetaminophen (TYLENOL) 325 MG tablet Take 650 mg by mouth every 6 (six) hours as needed for mild pain or headache.  Marland Kitchen aspirin 81 MG chewable tablet Chew by mouth daily.  Marland Kitchen atorvastatin (LIPITOR) 40 MG tablet TAKE ONE TABLET (40MG  TOTAL) BY MOUTH DAILY AT 6PM  . Coenzyme Q10 (CO Q-10) 200 MG CAPS Take by mouth daily.  . ferrous sulfate 325 (65 FE) MG tablet Take 1 tablet (325 mg total) by mouth 2 (two) times daily with a meal. (Patient taking differently: Take 325 mg by mouth daily with breakfast. )  . gabapentin (NEURONTIN) 100 MG capsule Take 1 capsule (100 mg total) by mouth 3 (three) times daily.  Marland Kitchen losartan (COZAAR) 50 MG tablet TAKE ONE (1) TABLET BY MOUTH EVERY DAY FOR BLOOD PRESSURE (Patient taking differently: Take 50 mg by mouth daily. )  . Multiple Vitamins-Minerals (CENTRUM SILVER ADULT 50+) TABS Take 1  capsule by mouth 2 (two) times daily.  . sildenafil (VIAGRA) 100 MG tablet TAKE ONE TABLET (100MG  TOTAL) BY MOUTH DAILY AS NEEDED FOR ERECTILE DYSFUNCTION (Patient taking differently: Take 100 mg by mouth as needed for erectile dysfunction. )   No current facility-administered medications for this visit. (Other)      REVIEW OF SYSTEMS:    ALLERGIES No Known Allergies  PAST MEDICAL HISTORY Past Medical History:  Diagnosis Date  . Anemia   . Anxiety   . 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 Depression   . Falls   . Hallucinations   . Hyperlipidemia   . Hypertension   . Stroke [facility])    left paracentral pons infarct   Past Surgical History:  Procedure Laterality Date  . EYE SURGERY    . HERNIA REPAIR    . POSTERIOR CERVICAL LAMINECTOMY     with fusion C3-C7 10/19- Dr. Brigitte Pulse at [place] Family History  Problem Relation Age of Onset  . Multiple sclerosis Son   . Bipolar disorder Son     SOCIAL HISTORY Social History   Tobacco Use  . Smoking status: Former Smoker    Quit date: 11/21/1985    Years since quitting:  34.8  . Smokeless tobacco: Never Used  Substance Use Topics  . Alcohol use: No  . Drug use: No         OPHTHALMIC EXAM: Base Eye Exam    Visual Acuity (ETDRS)      Right Left   Dist cc 20/30 +2 20/100 +2   Dist ph cc 20/25 -2 20/70 -1   Correction: Glasses       Tonometry (Tonopen, 10:17 AM)      Right Left   Pressure 12 13       Pupils      Dark Light Shape React APD   Right 4 3 Round Slow None   Left 3 2 Round Slow None       Visual Fields (Counting fingers)      Left Right    Full Full       Extraocular Movement      Right Left    Full Full       Neuro/Psych    Oriented x3: Yes   Mood/Affect: Normal       Dilation    Left eye: 1.0% Mydriacyl, 2.5% Phenylephrine @ 10:22 AM        Slit Lamp and Fundus Exam    External Exam      Right Left   External Normal Normal       Slit  Lamp Exam      Right Left   Lids/Lashes Normal Normal   Conjunctiva/Sclera White and quiet White and quiet   Cornea Clear Clear   Anterior Chamber Deep and quiet Deep and quiet   Iris Round and reactive Round and reactive   Lens Posterior chamber intraocular lens Posterior chamber intraocular lens   Anterior Vitreous Normal Normal       Fundus Exam      Right Left   Posterior Vitreous  ,, Central vitreous floaters   Disc  Early collaterals   C/D Ratio  0.4   Macula  Macular thickening less, Microaneurysms, less Cystoid macular edema,  Intraretinal hemorrhage less, large subfoveal druse   Vessels  Hemiretinal vein occlusion superiorly.   Periphery  Normal          IMAGING AND PROCEDURES  Imaging and Procedures for 09/16/20  OCT, Retina - OU - Both Eyes       Right Eye Quality was good. Scan locations included subfoveal. Central Foveal Thickness: 277. Progression has been stable.   Left Eye Quality was good. Scan locations included subfoveal. Central Foveal Thickness: 288. Progression has improved. Findings include subretinal scarring, vitreomacular adhesion .   Notes Much less CME superiorly from hemispheric small are limited by outer retinal scarring loss of EOM and photoreceptor layer.  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 Vitreomacular adhesion of left eye  H43.822     1.  Repeat examination left eye in 7 weeks.  2.  Repeat injection intravitreal Avastin OS today  3.  Ophthalmic Meds Ordered this visit:  No orders of the defined types were placed in this encounter.  Return in about 7 weeks  (around 11/04/2020) for dilate, OS, AVASTIN OCT.  There are no Patient Instructions on file for this visit.   Explained the diagnoses, plan, and follow up with the patient and they expressed understanding.  Patient expressed understanding of the importance of proper follow up care.   Clent Demark Dashay Giesler M.D. Diseases & Surgery of the Retina and Vitreous Retina & Diabetic [place] 09/16/20     Abbreviations: M myopia (nearsighted); A astigmatism; H hyperopia (farsighted); P presbyopia; Mrx spectacle prescription;  CTL contact lenses; OD right eye; OS left eye; OU both eyes  XT exotropia; ET esotropia; PEK punctate epithelial keratitis; PEE punctate epithelial erosions; DES dry eye syndrome; MGD meibomian gland dysfunction; ATs artificial tears; PFAT's preservative free artificial tears; [place] nuclear sclerotic cataract; PSC posterior subcapsular cataract; ERM epi-retinal membrane; PVD posterior vitreous detachment; RD retinal detachment; DM diabetes mellitus; DR diabetic retinopathy; NPDR non-proliferative diabetic retinopathy; PDR proliferative diabetic retinopathy; CSME clinically significant macular edema; DME diabetic macular edema; dbh dot blot hemorrhages; CWS cotton wool spot; POAG primary open angle glaucoma; C/D cup-to-disc ratio; HVF humphrey visual field; GVF goldmann visual field; OCT optical coherence tomography; IOP intraocular pressure; BRVO Branch retinal vein occlusion; CRVO central retinal vein occlusion; CRAO central retinal artery occlusion; BRAO branch retinal artery occlusion; RT retinal tear; SB scleral buckle; PPV pars plana vitrectomy; VH Vitreous hemorrhage; PRP panretinal laser photocoagulation; IVK intravitreal kenalog; VMT vitreomacular traction; MH Macular hole;  NVD neovascularization of the disc; NVE neovascularization elsewhere; AREDS age related eye disease study; ARMD age related macular degeneration; POAG primary open angle glaucoma; EBMD epithelial/anterior basement  membrane dystrophy; ACIOL anterior chamber intraocular lens; IOL intraocular lens; PCIOL posterior chamber intraocular lens; Phaco/IOL phacoemulsification with intraocular lens placement; [place] photorefractive keratectomy; LASIK laser assisted in situ keratomileusis; HTN hypertension; DM diabetes mellitus; COPD chronic obstructive pulmonary disease

## 2020-09-16 NOTE — Assessment & Plan Note (Signed)
Minor will observe

## 2020-09-30 NOTE — Progress Notes (Signed)
Spoke with Pt wife she was made aware of Mr. Katherine Mantle results

## 2020-10-27 DIAGNOSIS — H3581 Retinal edema: Secondary | ICD-10-CM | POA: Diagnosis not present

## 2020-11-04 ENCOUNTER — Encounter (INDEPENDENT_AMBULATORY_CARE_PROVIDER_SITE_OTHER): Payer: Self-pay | Admitting: Ophthalmology

## 2020-11-04 ENCOUNTER — Ambulatory Visit (INDEPENDENT_AMBULATORY_CARE_PROVIDER_SITE_OTHER): Payer: Medicare Other | Admitting: Ophthalmology

## 2020-11-04 ENCOUNTER — Other Ambulatory Visit: Payer: Self-pay

## 2020-11-04 DIAGNOSIS — H34812 Central retinal vein occlusion, left eye, with macular edema: Secondary | ICD-10-CM | POA: Diagnosis not present

## 2020-11-04 DIAGNOSIS — H353122 Nonexudative age-related macular degeneration, left eye, intermediate dry stage: Secondary | ICD-10-CM

## 2020-11-04 MED ORDER — BEVACIZUMAB 2.5 MG/0.1ML IZ SOSY
2.5000 mg | PREFILLED_SYRINGE | INTRAVITREAL | Status: AC | PRN
  Administered 2020-11-04: 2.5 mg via INTRAVITREAL

## 2020-11-04 NOTE — Assessment & Plan Note (Signed)
The nature of age--related macular degeneration was discussed with the patient as well as the distinction between dry and wet types. Checking an Amsler Grid daily with advice to return immediately should a distortion develop, was given to the patient. The patient 's smoking status now and in the past was determined and advice based on the AREDS study was provided regarding the consumption of antioxidant supplements. AREDS 2 vitamin formulation was recommended. Consumption of dark leafy vegetables and fresh fruits of various colors was recommended. Treatment modalities for wet macular degeneration particularly the use of intravitreal injections of anti-blood vessel growth factors was discussed with the patient. Avastin, Lucentis, and Eylea are the available options. On occasion, therapy includes the use of photodynamic therapy and thermal laser. Stressed to the patient do not rub eyes.  Patient was advised to check Amsler Grid daily and return immediately if changes are noted. Instructions on using the grid were given to the patient. All patient questions were answered.  OS stable

## 2020-11-04 NOTE — Assessment & Plan Note (Signed)
Stabilized acuity and improved macular anatomy OS on intravitreal Avastin currently at 7-week interval.  We will repeat injection today and examination in 8 weeks.  Acuity will be limited by some damage to the outer photoreceptor layers and the fovea that happened from his condition.

## 2020-11-04 NOTE — Progress Notes (Signed)
11/04/2020     CHIEF COMPLAINT Patient presents for Retina Follow Up   HISTORY OF PRESENT ILLNESS: Devin [place] is a 79 y.o. 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  Severity is moderate.  Duration of 7 weeks.  Since onset it is stable.  I, the attending physician,  performed the HPI with the patient and updated documentation appropriately.          Comments    7 Week HRVO f\u OS. Possible Avastin OS. OCT  Pt states no new changes or issues.       Last edited by Tilda Franco on 11/04/2020 11:07 AM. (History)      Referring physician: [name], MD [id number] [facility] [address] [place],  [place] [address]  HISTORICAL INFORMATION:   Selected notes from the MEDICAL RECORD NUMBER    Lab Results  Component Value Date   HGBA1C 4.6 (L) 01/14/2020     CURRENT MEDICATIONS: No current outpatient medications on file. (Ophthalmic Drugs)   No current facility-administered medications for this visit. (Ophthalmic Drugs)   Current Outpatient Medications (Other)  Medication Sig  . acetaminophen (TYLENOL) 325 MG tablet Take 650 mg by mouth every 6 (six) hours as needed for mild pain or headache.  Marland Kitchen aspirin 81 MG chewable tablet Chew by mouth daily.  Marland Kitchen atorvastatin (LIPITOR) 40 MG tablet TAKE ONE TABLET (40MG  TOTAL) BY MOUTH DAILY AT 6PM  . Coenzyme Q10 (CO Q-10) 200 MG CAPS Take by mouth daily.  . ferrous sulfate 325 (65 FE) MG tablet Take 1 tablet (325 mg total) by mouth 2 (two) times daily with a meal. (Patient taking differently: Take 325 mg by mouth daily with breakfast. )  . gabapentin (NEURONTIN) 100 MG capsule Take 1 capsule (100 mg total) by mouth 3 (three) times daily.  Marland Kitchen losartan (COZAAR) 50 MG tablet TAKE ONE (1) TABLET BY MOUTH EVERY DAY FOR BLOOD PRESSURE (Patient taking differently: Take 50 mg by mouth daily. )  . Multiple Vitamins-Minerals (CENTRUM SILVER ADULT 50+) TABS Take 1 capsule by  mouth 2 (two) times daily.  . sildenafil (VIAGRA) 100 MG tablet TAKE ONE TABLET (100MG  TOTAL) BY MOUTH DAILY AS NEEDED FOR ERECTILE DYSFUNCTION (Patient taking differently: Take 100 mg by mouth as needed for erectile dysfunction. )   No current facility-administered medications for this visit. (Other)      REVIEW OF SYSTEMS:    ALLERGIES No Known Allergies  PAST MEDICAL HISTORY Past Medical History:  Diagnosis Date  . Anemia   . Anxiety   . 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 Depression   . Falls   . Hallucinations   . Hyperlipidemia   . Hypertension   . Stroke [facility])    left paracentral pons infarct   Past Surgical History:  Procedure Laterality Date  . EYE SURGERY    . HERNIA REPAIR    . POSTERIOR CERVICAL LAMINECTOMY     with fusion C3-C7 10/19- Dr. Brigitte Pulse at [facility] Family History  Problem Relation Age of Onset  . Multiple sclerosis Son   . Bipolar disorder Son     SOCIAL HISTORY Social History   Tobacco Use  . Smoking status: Former Smoker    Quit date: 11/21/1985    Years since quitting: 34.9  . Smokeless tobacco: Never Used  Substance Use Topics  . Alcohol use: No  . Drug use: No         OPHTHALMIC EXAM:  Base Eye Exam    Visual Acuity (ETDRS)      Right Left   Dist cc 20/40 + 20/100 +   Dist ph cc NI 20/60 -2   Correction: Glasses       Tonometry (Tonopen, 11:13 AM)      Right Left   Pressure 13 12       Pupils      Pupils Dark Light Shape React APD   Right PERRL 3 2 Round Brisk None   Left PERRL 3 2 Round Brisk None       Visual Fields (Counting fingers)      Left Right    Full Full       Neuro/Psych    Oriented x3: Yes   Mood/Affect: Normal       Dilation    Left eye: 1.0% Mydriacyl, 2.5% Phenylephrine @ 11:13 AM        Slit Lamp and Fundus Exam    External Exam      Right Left   External Normal Normal       Slit Lamp Exam      Right Left   Lids/Lashes Normal Normal    Conjunctiva/Sclera White and quiet White and quiet   Cornea Clear Clear   Anterior Chamber Deep and quiet Deep and quiet   Iris Round and reactive Round and reactive   Lens Posterior chamber intraocular lens Posterior chamber intraocular lens   Anterior Vitreous Normal Normal       Fundus Exam      Right Left   Posterior Vitreous  ,, Central vitreous floaters   Disc  collaterals   C/D Ratio  0.4   Macula  Macular thickening less, Microaneurysms, less Cystoid macular edema,  Intraretinal hemorrhage less, large subfoveal druse   Vessels  Hemiretinal vein occlusion superiorly.   Periphery  Normal          IMAGING AND PROCEDURES  Imaging and Procedures for 11/04/20  OCT, Retina - OU - Both Eyes       Right Eye Quality was good. Scan locations included subfoveal. Central Foveal Thickness: 285.   Left Eye Quality was good. Scan locations included subfoveal. Central Foveal Thickness: 288. Findings include cystoid macular edema, vitreomacular adhesion , vitreous traction.   Notes Outer retinal photoreceptor easy disruption center fovea accounts for the acuity, OS.  But overall CME has stabilized and improved       Intravitreal Injection, Pharmacologic Agent - OS - Left Eye       Time Out 11/04/2020. 11:56 AM. Confirmed correct patient, procedure, site, and patient consented.   Anesthesia Topical anesthesia was used. Anesthetic medications included Akten 3.5%.   Procedure Preparation included Ofloxacin , 10% betadine to eyelids, 5% betadine to ocular surface. A 30 gauge needle was used.   Injection:  2.5 mg Bevacizumab (AVASTIN) 2.5mg /0.45mL SOSY   NDC: 69485-462-70, Lot: [id number]   Route: Intravitreal, Site: Left Eye  Post-op Post injection exam found visual acuity of at least counting fingers. The patient tolerated the procedure well. There were no complications. The patient received written and verbal post procedure care education. Post injection medications were  not given.                 ASSESSMENT/PLAN:  Intermediate stage nonexudative age-related macular degeneration of left eye The nature of age--related macular  degeneration was discussed with the patient as well as the distinction between dry and wet types. Checking an Amsler Grid daily with advice to return immediately should a distortion develop, was given to the patient. The patient 's smoking status now and in the past was determined and advice based on the AREDS study was provided regarding the consumption of antioxidant supplements. AREDS 2 vitamin formulation was recommended. Consumption of dark leafy vegetables and fresh fruits of various colors was recommended. Treatment modalities for wet macular degeneration particularly the use of intravitreal injections of anti-blood vessel growth factors was discussed with the patient. Avastin, Lucentis, and Eylea are the available options. On occasion, therapy includes the use of photodynamic therapy and thermal laser. Stressed to the patient do not rub eyes.  Patient was advised to check Amsler Grid daily and return immediately if changes are noted. Instructions on using the grid were given to the patient. All patient questions were answered.  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  We will repeat injection today and examination in 8 weeks.  Acuity will be limited by some damage to the outer photoreceptor layers and the fovea that happened from his condition.      ICD-10-CM   1. 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 Intermediate stage nonexudative age-related macular degeneration of left eye  H35.3122     1.  We will repeat  injection intravitreal Avastin today and extend interval of examination to 8 weeks next  2.  3.  Ophthalmic Meds Ordered this visit:  Meds ordered this encounter  Medications  . bevacizumab (AVASTIN) SOSY 2.5 mg       Return in about 8 weeks (around 12/30/2020) for dilate, OS, AVASTIN OCT.  There are no Patient Instructions on file for this visit.   Explained the diagnoses, plan, and follow up with the patient and they expressed understanding.  Patient expressed understanding of the importance of proper follow up care.   Clent Demark Oniyah Rohe M.D. Diseases & Surgery of the Retina and Vitreous [facility] [place] 11/04/20     Abbreviations: M myopia (nearsighted); A astigmatism; H hyperopia (farsighted); P presbyopia; Mrx spectacle prescription;  CTL contact lenses; OD right eye; OS left eye; OU both eyes  XT exotropia; ET esotropia; PEK punctate epithelial keratitis; PEE punctate epithelial erosions; DES dry eye syndrome; MGD meibomian gland dysfunction; ATs artificial tears; PFAT's preservative free artificial tears; [place] nuclear sclerotic cataract; PSC posterior subcapsular cataract; ERM epi-retinal membrane; PVD posterior vitreous detachment; RD retinal detachment; DM diabetes mellitus; DR diabetic retinopathy; NPDR non-proliferative diabetic retinopathy; PDR proliferative diabetic retinopathy; CSME clinically significant macular edema; DME diabetic macular edema; dbh dot blot hemorrhages; CWS cotton wool spot; POAG primary open angle glaucoma; C/D cup-to-disc ratio; HVF humphrey visual field; GVF goldmann visual field; OCT optical coherence tomography; IOP intraocular pressure; BRVO Branch retinal vein occlusion; CRVO central retinal vein occlusion; CRAO central retinal artery occlusion; BRAO branch retinal artery occlusion; RT retinal tear; SB scleral buckle; PPV pars plana vitrectomy; VH Vitreous hemorrhage; PRP panretinal laser photocoagulation; IVK intravitreal kenalog; VMT  vitreomacular traction; MH Macular hole;  NVD neovascularization of the disc; NVE neovascularization elsewhere; AREDS age related  eye disease study; ARMD age related macular degeneration; POAG primary open angle glaucoma; EBMD epithelial/anterior basement membrane dystrophy; ACIOL anterior chamber intraocular lens; IOL intraocular lens; PCIOL posterior chamber intraocular lens; Phaco/IOL phacoemulsification with intraocular lens placement; [place] photorefractive keratectomy; LASIK laser assisted in situ keratomileusis; HTN hypertension; DM diabetes mellitus; COPD chronic obstructive pulmonary disease

## 2020-11-12 ENCOUNTER — Other Ambulatory Visit: Payer: Self-pay | Admitting: Family Medicine

## 2020-12-18 ENCOUNTER — Ambulatory Visit (INDEPENDENT_AMBULATORY_CARE_PROVIDER_SITE_OTHER): Payer: Medicare Other | Admitting: Family Medicine

## 2020-12-18 ENCOUNTER — Other Ambulatory Visit: Payer: Self-pay

## 2020-12-18 VITALS — BP 144/82 | HR 70 | Temp 97.8°F | Resp 15 | Ht 67.0 in | Wt 151.0 lb

## 2020-12-18 DIAGNOSIS — Z13220 Encounter for screening for lipoid disorders: Secondary | ICD-10-CM | POA: Diagnosis not present

## 2020-12-18 DIAGNOSIS — Z136 Encounter for screening for cardiovascular disorders: Secondary | ICD-10-CM | POA: Diagnosis not present

## 2020-12-18 DIAGNOSIS — Z8673 Personal history of transient ischemic attack (TIA), and cerebral infarction without residual deficits: Secondary | ICD-10-CM

## 2020-12-18 DIAGNOSIS — I10 Essential (primary) hypertension: Secondary | ICD-10-CM | POA: Diagnosis not present

## 2020-12-18 NOTE — Progress Notes (Signed)
Subjective:    Patient ID: [place], male    DOB: 1940/12/19, 80 y.o.   MRN: [id number]  HPI Patient is here today for recheck of his blood pressure.  He states his blood pressure at home was in the 120s over 80s.  He denies any chest pain shortness of breath or dyspnea on exertion.  He denies any trouble taking his cholesterol medication.  Specifically he denies any myalgias or right upper quadrant pain.  He is consistently taking his aspirin.  He denies any memory issues.  He denies any word finding difficulties.  He continues to have some issues with gait due to his stroke.  He has a little bit of a shuffling gait and slightly drags his right foot when he walks.  However overall he is doing extremely well Past Medical History:  Diagnosis Date  . Anemia   . Anxiety   . 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 Depression   . Falls   . Hallucinations   . Hyperlipidemia   . Hypertension   . Stroke [facility])    left paracentral pons infarct   Past Surgical History:  Procedure Laterality Date  . EYE SURGERY    . HERNIA REPAIR    . POSTERIOR CERVICAL LAMINECTOMY     with fusion C3-C7 10/19- Dr. Brigitte Pulse at [facility]   Current Outpatient Medications on File Prior to Visit  Medication Sig Dispense Refill  . acetaminophen (TYLENOL) 325 MG tablet Take 650 mg by mouth every 6 (six) hours as needed for mild pain or headache.    Marland Kitchen aspirin 81 MG chewable tablet Chew by mouth daily.    Marland Kitchen atorvastatin (LIPITOR) 40 MG tablet TAKE ONE TABLET (40MG  TOTAL) BY MOUTH DAILY AT 6PM 90 tablet 1  . Coenzyme Q10 (CO Q-10) 200 MG CAPS Take by mouth daily.    . ferrous sulfate 325 (65 FE) MG tablet Take 1 tablet (325 mg total) by mouth 2 (two) times daily with a meal. (Patient taking differently: Take 325 mg by mouth daily with breakfast. ) 60 tablet 1  . gabapentin (NEURONTIN) 100 MG capsule Take 1 capsule (100 mg total) by mouth 3 (three) times daily. 90 capsule 3  . losartan (COZAAR) 50  MG tablet TAKE ONE (1) TABLET BY MOUTH EVERY DAY FOR BLOOD PRESSURE 90 tablet 3  . Multiple Vitamins-Minerals (CENTRUM SILVER ADULT 50+) TABS Take 1 capsule by mouth 2 (two) times daily. 30 tablet 2  . sildenafil (VIAGRA) 100 MG tablet TAKE ONE TABLET (100MG  TOTAL) BY MOUTH DAILY AS NEEDED FOR ERECTILE DYSFUNCTION (Patient taking differently: Take 100 mg by mouth as needed for erectile dysfunction. ) 10 tablet 5   No current facility-administered medications on file prior to visit.   No Known Allergies Social History   Socioeconomic History  . Marital status: Married    Spouse name: Not on file  . Number of children: Not on file  . Years of education: Not on file  . Highest education level: Not on file  Occupational History  . Not on file  Tobacco Use  . Smoking status: Former Smoker    Quit date: 11/21/1985    Years since quitting: 35.0  . Smokeless tobacco: Never Used  Substance and Sexual Activity  . Alcohol use: No  . Drug use: No  . Sexual activity: Yes  Other Topics Concern  . Not on file  Social History Narrative  . Not on file  Social Determinants of Health   Financial Resource Strain: Not on file  Food Insecurity: Not on file  Transportation Needs: Not on file  Physical Activity: Not on file  Stress: Not on file  Social Connections: Not on file  Intimate Partner Violence: Not on file    Review of Systems  All other systems reviewed and are negative.      Objective:   Physical Exam Constitutional:      Appearance: Normal appearance. He is normal weight.  Cardiovascular:     Rate and Rhythm: Normal rate and regular rhythm. Frequent extrasystoles are present.    Heart sounds: Normal heart sounds. No murmur heard. No friction rub. No gallop.   Pulmonary:     Effort: Pulmonary effort is normal. No respiratory distress.     Breath sounds: Normal breath sounds. No stridor. No wheezing, rhonchi or rales.  Chest:     Chest wall: No tenderness.   Musculoskeletal:     Right lower leg: No edema.     Left lower leg: No edema.  Neurological:     Mental Status: He is alert and oriented to person, place, and time. Mental status is at baseline.     Cranial Nerves: Cranial nerves are intact.     Sensory: Sensation is intact.     Motor: Weakness present. No seizure activity.     Coordination: Coordination is intact.     Gait: Gait abnormal.     Deep Tendon Reflexes: Reflexes are normal and symmetric.           Assessment & Plan:  History of stroke - Plan: COMPLETE METABOLIC PANEL WITH GFR, CBC with Differential/Platelet, Lipid panel  Benign essential HTN  Blood pressure today is adequately controlled especially based on his readings at home.  I will check a fasting lipid panel.  Ideally I like his LDL cholesterol to be below 70.  Monitor his renal function and liver function test as well.  He is compliant with his antiplatelet agent.  Strongly encouraged the patient to get his Covid booster.

## 2020-12-19 LAB — COMPLETE METABOLIC PANEL WITH GFR
AG Ratio: 2.1 (calc) (ref 1.0–2.5)
ALT: 25 U/L (ref 9–46)
AST: 22 U/L (ref 10–35)
Albumin: 4.1 g/dL (ref 3.6–5.1)
Alkaline phosphatase (APISO): 69 U/L (ref 35–144)
BUN/Creatinine Ratio: 17 (calc) (ref 6–22)
BUN: 22 mg/dL (ref 7–25)
CO2: 25 mmol/L (ref 20–32)
Calcium: 9.5 mg/dL (ref 8.6–10.3)
Chloride: 102 mmol/L (ref 98–110)
Creat: 1.28 mg/dL — ABNORMAL HIGH (ref 0.70–1.18)
GFR, Est African American: 61 mL/min/{1.73_m2} (ref >=60)
GFR, Est Non African American: 53 mL/min/{1.73_m2} — ABNORMAL LOW (ref >=60)
Globulin: 2 g/dL (calc) (ref 1.9–3.7)
Glucose, Bld: 102 mg/dL — ABNORMAL HIGH (ref 65–99)
Potassium: 4.2 mmol/L (ref 3.5–5.3)
Sodium: 137 mmol/L (ref 135–146)
Total Bilirubin: 0.4 mg/dL (ref 0.2–1.2)
Total Protein: 6.1 g/dL (ref 6.1–8.1)

## 2020-12-19 LAB — CBC WITH DIFFERENTIAL/PLATELET
Absolute Monocytes: 650 cells/uL (ref 200–950)
Basophils Absolute: 31 cells/uL (ref 0–200)
Basophils Relative: 0.6 %
Eosinophils Absolute: 130 cells/uL (ref 15–500)
Eosinophils Relative: 2.5 %
HCT: 34 % — ABNORMAL LOW (ref 38.5–50.0)
Hemoglobin: 11.3 g/dL — ABNORMAL LOW (ref 13.2–17.1)
Lymphs Abs: 962 cells/uL (ref 850–3900)
MCH: 29.4 pg (ref 27.0–33.0)
MCHC: 33.2 g/dL (ref 32.0–36.0)
MCV: 88.3 fL (ref 80.0–100.0)
MPV: 11.2 fL (ref 7.5–12.5)
Monocytes Relative: 12.5 %
Neutro Abs: 3427 cells/uL (ref 1500–7800)
Neutrophils Relative %: 65.9 %
Platelets: 206 10*3/uL (ref 140–400)
RBC: 3.85 10*6/uL — ABNORMAL LOW (ref 4.20–5.80)
RDW: 11.3 % (ref 11.0–15.0)
Total Lymphocyte: 18.5 %
WBC: 5.2 10*3/uL (ref 3.8–10.8)

## 2020-12-19 LAB — LIPID PANEL
Cholesterol: 119 mg/dL (ref <200)
HDL: 46 mg/dL (ref >=40)
LDL Cholesterol (Calc): 57 mg/dL (calc)
Non-HDL Cholesterol (Calc): 73 mg/dL (calc) (ref <130)
Total CHOL/HDL Ratio: 2.6 (calc) (ref <5.0)
Triglycerides: 80 mg/dL (ref <150)

## 2020-12-21 ENCOUNTER — Encounter: Payer: Self-pay | Admitting: *Deleted

## 2020-12-30 ENCOUNTER — Other Ambulatory Visit: Payer: Self-pay

## 2020-12-30 ENCOUNTER — Ambulatory Visit (INDEPENDENT_AMBULATORY_CARE_PROVIDER_SITE_OTHER): Payer: Medicare Other | Admitting: Ophthalmology

## 2020-12-30 ENCOUNTER — Encounter (INDEPENDENT_AMBULATORY_CARE_PROVIDER_SITE_OTHER): Payer: Self-pay | Admitting: Ophthalmology

## 2020-12-30 DIAGNOSIS — H34812 Central retinal vein occlusion, left eye, with macular edema: Secondary | ICD-10-CM | POA: Diagnosis not present

## 2020-12-30 DIAGNOSIS — H353112 Nonexudative age-related macular degeneration, right eye, intermediate dry stage: Secondary | ICD-10-CM | POA: Diagnosis not present

## 2020-12-30 MED ORDER — BEVACIZUMAB 2.5 MG/0.1ML IZ SOSY
2.5000 mg | PREFILLED_SYRINGE | INTRAVITREAL | Status: AC | PRN
  Administered 2020-12-30: 2.5 mg via INTRAVITREAL

## 2020-12-30 NOTE — Assessment & Plan Note (Signed)

## 2020-12-30 NOTE — Assessment & Plan Note (Signed)
Acuity left eye vastly improved since onset of therapy for macular edema associated with hemispheric central retinal vein occlusion and secondary CME.  Visual acuity is improved now to 20/70 from originally of 20/200  We will repeat injection intravitreal Avastin today at 8-week interval and examination again in 10 weeks

## 2020-12-30 NOTE — Progress Notes (Signed)
12/30/2020     CHIEF COMPLAINT Patient presents for Retina Follow Up (8 Week F/U OS, poss Avastin OS//Pt sts he feels VA OS is gradually improving. Pt denies new symptoms OU.)   HISTORY OF PRESENT ILLNESS: Devin Valdez is a 80 y.o. male who presents to the clinic today for:   HPI    Retina Follow Up    Patient presents with  Other.  In left eye.  This started 8 weeks ago.  Severity is moderate.  Duration of 8 weeks.  Since onset it is gradually improving. Additional comments: 8 Week F/U OS, poss Avastin OS  Pt sts he feels VA OS is gradually improving. Pt denies new symptoms OU.       Last edited by Rockie Neighbours, Lely on 12/30/2020 10:32 AM. (History)      Referring physician: [name], MD [address] [facility] [address] [place],  [place] [address]  HISTORICAL INFORMATION:   Selected notes from the MEDICAL RECORD NUMBER    Lab Results  Component Value Date   HGBA1C 4.6 (L) 01/14/2020     CURRENT MEDICATIONS: No current outpatient medications on file. (Ophthalmic Drugs)   No current facility-administered medications for this visit. (Ophthalmic Drugs)   Current Outpatient Medications (Other)  Medication Sig  . acetaminophen (TYLENOL) 325 MG tablet Take 650 mg by mouth every 6 (six) hours as needed for mild pain or headache.  Marland Kitchen aspirin 81 MG chewable tablet Chew by mouth daily.  Marland Kitchen atorvastatin (LIPITOR) 40 MG tablet TAKE ONE TABLET (40MG  TOTAL) BY MOUTH DAILY AT 6PM  . Coenzyme Q10 (CO Q-10) 200 MG CAPS Take by mouth daily.  . ferrous sulfate 325 (65 FE) MG tablet Take 1 tablet (325 mg total) by mouth 2 (two) times daily with a meal. (Patient not taking: Reported on 12/18/2020)  . gabapentin (NEURONTIN) 100 MG capsule Take 1 capsule (100 mg total) by mouth 3 (three) times daily. (Patient not taking: Reported on 12/18/2020)  . losartan (COZAAR) 50 MG tablet TAKE ONE (1) TABLET BY MOUTH EVERY DAY FOR BLOOD PRESSURE  . Multiple Vitamins-Minerals (CENTRUM SILVER ADULT 50+)  TABS Take 1 capsule by mouth 2 (two) times daily.  . sildenafil (VIAGRA) 100 MG tablet TAKE ONE TABLET (100MG  TOTAL) BY MOUTH DAILY AS NEEDED FOR ERECTILE DYSFUNCTION (Patient taking differently: Take 100 mg by mouth as needed for erectile dysfunction.)   No current facility-administered medications for this visit. (Other)      REVIEW OF SYSTEMS:    ALLERGIES No Known Allergies  PAST MEDICAL HISTORY Past Medical History:  Diagnosis Date  . Anemia   . Anxiety   . 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 Depression   . Falls   . Hallucinations   . Hyperlipidemia   . Hypertension   . Stroke [facility])    left paracentral pons infarct   Past Surgical History:  Procedure Laterality Date  . EYE SURGERY    . HERNIA REPAIR    . POSTERIOR CERVICAL LAMINECTOMY     with fusion C3-C7 10/19- Dr. Brigitte Pulse at [place] Family History  Problem Relation Age of Onset  . Multiple sclerosis Son   . Bipolar disorder Son     SOCIAL HISTORY Social History   Tobacco Use  . Smoking status: Former Smoker    Quit date: 11/21/1985    Years since quitting: 35.1  . Smokeless tobacco: Never Used  Substance Use  Topics  . Alcohol use: No  . Drug use: No         OPHTHALMIC EXAM:  Base Eye Exam    Visual Acuity (ETDRS)      Right Left   Dist cc 20/40 +2 20/70 +2   Dist ph cc 20/30 +2 20/60 -2   Correction: Glasses       Tonometry (Tonopen, 10:32 AM)      Right Left   Pressure 12 11       Pupils      Pupils Dark Light Shape React APD   Right PERRL 3 2 Round Brisk None   Left PERRL 3 2 Round Brisk None       Visual Fields (Counting fingers)      Left Right    Full Full       Extraocular Movement      Right Left    Full Full       Neuro/Psych    Oriented x3: Yes   Mood/Affect: Normal       Dilation    Left eye: 1.0% Mydriacyl, 2.5% Phenylephrine @ 10:37 AM        Slit Lamp and Fundus Exam    External Exam      Right Left    External Normal Normal       Slit Lamp Exam      Right Left   Lids/Lashes Normal Normal   Conjunctiva/Sclera White and quiet White and quiet   Cornea Clear Clear   Anterior Chamber Deep and quiet Deep and quiet   Iris Round and reactive Round and reactive   Lens Posterior chamber intraocular lens Posterior chamber intraocular lens   Anterior Vitreous Normal Normal       Fundus Exam      Right Left   Posterior Vitreous  ,, Central vitreous floaters   Disc  collaterals   C/D Ratio  0.4   Macula  Macular thickening less, Microaneurysms, less Cystoid macular edema,  Intraretinal hemorrhage less, large subfoveal druse   Vessels  Hemiretinal vein occlusion superiorly.   Periphery  Normal          IMAGING AND PROCEDURES  Imaging and Procedures for 12/30/20  OCT, Retina - OU - Both Eyes       Right Eye Quality was good. Scan locations included subfoveal. Central Foveal Thickness: 289.   Left Eye Quality was good. Scan locations included subfoveal. Central Foveal Thickness: 293. Findings include cystoid macular edema, vitreomacular adhesion , vitreous traction.   Notes Outer retinal photoreceptor EZ layer disruption center fovea accounts for the acuity, OS.  But overall CME has stabilized and improved       Intravitreal Injection, Pharmacologic Agent - OS - Left Eye       Time Out 12/30/2020. 12:01 PM. Confirmed correct patient, procedure, site, and patient consented.   Anesthesia Topical anesthesia was used. Anesthetic medications included Akten 3.5%.   Procedure Preparation included Tobramycin 0.3%, 10% betadine to eyelids, 5% betadine to ocular surface. A 30 gauge needle was used.   Injection:  2.5 mg Bevacizumab (AVASTIN) 2.5mg /0.19mL SOSY   NDC: 54562-563-89, Lot: [id number]   Route: Intravitreal, Site: Left Eye  Post-op Post injection exam found visual acuity of at least counting fingers. The patient tolerated the procedure well. There were no complications. The  patient received written and verbal post procedure care education. Post injection medications were not given.  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  Visual acuity is improved now to 20/70 from originally of 20/200  We will repeat injection intravitreal Avastin today at 8-week interval and examination again in 10 weeks  Intermediate stage nonexudative age-related macular degeneration of right eye The nature of age-related macular degeneration was discussed with the patient as well as the distinction between dry and wet types. Checking an Amsler Grid daily with advice to return immediately should a distortion develop, was given to the patient. The patient 's smoking status now and in the past was determined and advice based on the AREDS study was provided regarding the consumption of antioxidant supplements. AREDS 2 vitamin formulation was recommended. Consumption of dark leafy vegetables and fresh fruits of various colors was recommended. Treatment modalities for wet macular degeneration particularly the use of intravitreal injections of anti-blood vessel growth factors was discussed with the patient. Avastin, Lucentis, and Eylea are the available options. On occasion, therapy includes the use of photodynamic therapy and thermal laser. Stressed to the patient do not rub eyes.  Patient was advised to check Amsler Grid daily and return immediately if changes are noted. Instructions on using the grid were given to the patient. All patient questions were answered.      ICD-10-CM   1. 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 Intermediate stage nonexudative age-related macular degeneration of right eye  H35.3112     1.  OS, looks great and vastly improved since onset of therapy some 9 months previous.  2.  We will repeat injection today left eye with intravitreal Avastin and extend interval of examination to 10 weeks  3.  Dilate OU next  Ophthalmic Meds Ordered this visit:  Meds ordered this encounter  Medications  . bevacizumab (AVASTIN) SOSY 2.5 mg       Return in about 10 weeks (around 03/10/2021) for DILATE OU, AVASTIN OCT, OS.  There are no Patient Instructions on file for this visit.   Explained the diagnoses, plan, and follow up with the patient and they expressed understanding.  Patient expressed understanding of the importance of proper follow up care.   Clent Demark Orland Visconti M.D. Diseases & Surgery of the Retina and Vitreous Retina & Diabetic [place] 12/30/20     Abbreviations: M myopia (nearsighted); A astigmatism; H hyperopia (farsighted); P presbyopia; Mrx spectacle prescription;  CTL contact lenses; OD right eye; OS left eye; OU both eyes  XT exotropia; ET esotropia; PEK punctate epithelial keratitis; PEE punctate epithelial erosions; DES dry eye syndrome; MGD meibomian gland dysfunction; ATs artificial tears; PFAT's preservative free artificial tears; [place] nuclear sclerotic cataract; PSC posterior subcapsular cataract; ERM epi-retinal membrane; PVD posterior vitreous detachment; RD retinal detachment; DM diabetes mellitus; DR diabetic retinopathy; NPDR non-proliferative diabetic retinopathy; PDR proliferative diabetic retinopathy; CSME clinically significant macular edema; DME diabetic macular edema; dbh dot blot hemorrhages; CWS cotton wool spot; POAG primary open angle glaucoma; C/D cup-to-disc ratio; HVF humphrey visual field; GVF goldmann visual field; OCT optical coherence tomography; IOP intraocular pressure; BRVO Branch retinal vein occlusion; CRVO central retinal vein occlusion; CRAO  central retinal artery occlusion; BRAO branch retinal artery occlusion;  RT retinal tear; SB scleral buckle; PPV pars plana vitrectomy; VH Vitreous hemorrhage; PRP panretinal laser photocoagulation; IVK intravitreal kenalog; VMT vitreomacular traction; MH Macular hole;  NVD neovascularization of the disc; NVE neovascularization elsewhere; AREDS age related eye disease study; ARMD age related macular degeneration; POAG primary open angle glaucoma; EBMD epithelial/anterior basement membrane dystrophy; ACIOL anterior chamber intraocular lens; IOL intraocular lens; PCIOL posterior chamber intraocular lens; Phaco/IOL phacoemulsification with intraocular lens placement; PRK photorefractive keratectomy; LASIK laser assisted in situ keratomileusis; HTN hypertension; DM diabetes mellitus; COPD chronic obstructive pulmonary disease 

## 2021-03-09 ENCOUNTER — Encounter (INDEPENDENT_AMBULATORY_CARE_PROVIDER_SITE_OTHER): Payer: Self-pay | Admitting: Ophthalmology

## 2021-03-09 ENCOUNTER — Other Ambulatory Visit: Payer: Self-pay

## 2021-03-09 ENCOUNTER — Ambulatory Visit (INDEPENDENT_AMBULATORY_CARE_PROVIDER_SITE_OTHER): Payer: Medicare Other | Admitting: Ophthalmology

## 2021-03-09 DIAGNOSIS — H34812 Central retinal vein occlusion, left eye, with macular edema: Secondary | ICD-10-CM | POA: Diagnosis not present

## 2021-03-09 DIAGNOSIS — H353112 Nonexudative age-related macular degeneration, right eye, intermediate dry stage: Secondary | ICD-10-CM

## 2021-03-09 DIAGNOSIS — H43822 Vitreomacular adhesion, left eye: Secondary | ICD-10-CM | POA: Diagnosis not present

## 2021-03-09 MED ORDER — BEVACIZUMAB 2.5 MG/0.1ML IZ SOSY
2.5000 mg | PREFILLED_SYRINGE | INTRAVITREAL | Status: AC | PRN
  Administered 2021-03-09: 2.5 mg via INTRAVITREAL

## 2021-03-09 NOTE — Progress Notes (Signed)
03/09/2021     CHIEF COMPLAINT Patient presents for Retina Follow Up (10 wk fu/ possible Avastin OS/Pt states, " I think that my va may have improved a little bit. It seems to be better.")   HISTORY OF PRESENT ILLNESS: Devin Valdez is a 80 y.o. male who presents to the clinic today for:   HPI    Retina Follow Up    Patient presents with  CRVO/BRVO.  In left eye.  This started 10 weeks ago.  Severity is mild.  Duration of 10 weeks.  Since onset it is gradually improving. Additional comments: 10 wk fu/ possible Avastin OS Pt states, " I think that my va may have improved a little bit. It seems to be better."       Last edited by Kendra Opitz, COA on 03/09/2021  9:23 AM. (History)      Referring physician: [name], MD [address] [place],  [place] [address]  HISTORICAL INFORMATION:   Selected notes from the MEDICAL RECORD NUMBER    Lab Results  Component Value Date   HGBA1C 4.6 (L) 01/14/2020     CURRENT MEDICATIONS: No current outpatient medications on file. (Ophthalmic Drugs)   No current facility-administered medications for this visit. (Ophthalmic Drugs)   Current Outpatient Medications (Other)  Medication Sig  . acetaminophen (TYLENOL) 325 MG tablet Take 650 mg by mouth every 6 (six) hours as needed for mild pain or headache.  Marland Kitchen aspirin 81 MG chewable tablet Chew by mouth daily.  Marland Kitchen atorvastatin (LIPITOR) 40 MG tablet TAKE ONE TABLET (40MG  TOTAL) BY MOUTH DAILY AT 6PM  . Coenzyme Q10 (CO Q-10) 200 MG CAPS Take by mouth daily.  . ferrous sulfate 325 (65 FE) MG tablet Take 1 tablet (325 mg total) by mouth 2 (two) times daily with a meal. (Patient not taking: Reported on 12/18/2020)  . gabapentin (NEURONTIN) 100 MG capsule Take 1 capsule (100 mg total) by mouth 3 (three) times daily. (Patient not taking: Reported on 12/18/2020)  . losartan (COZAAR) 50 MG tablet TAKE ONE (1) TABLET BY MOUTH EVERY DAY FOR BLOOD PRESSURE  . Multiple Vitamins-Minerals  (CENTRUM SILVER ADULT 50+) TABS Take 1 capsule by mouth 2 (two) times daily.  . sildenafil (VIAGRA) 100 MG tablet TAKE ONE TABLET (100MG  TOTAL) BY MOUTH DAILY AS NEEDED FOR ERECTILE DYSFUNCTION (Patient taking differently: Take 100 mg by mouth as needed for erectile dysfunction.)   No current facility-administered medications for this visit. (Other)      REVIEW OF SYSTEMS:    ALLERGIES No Known Allergies  PAST MEDICAL HISTORY Past Medical History:  Diagnosis Date  . Anemia   . Anxiety   . 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 Depression   . Falls   . Hallucinations   . Hyperlipidemia   . Hypertension   . Stroke [facility])    left paracentral pons infarct   Past Surgical History:  Procedure Laterality Date  . EYE SURGERY    . HERNIA REPAIR    . POSTERIOR CERVICAL LAMINECTOMY     with fusion C3-C7 10/19- Dr. Brigitte Pulse at [place] Family History  Problem Relation Age of Onset  . Multiple sclerosis Son   . Bipolar disorder Son     SOCIAL HISTORY Social History   Tobacco Use  . Smoking status: Former Smoker    Quit date: 11/21/1985    Years since quitting: 35.3  .  Smokeless tobacco: Never Used  Substance Use Topics  . Alcohol use: No  . Drug use: No         OPHTHALMIC EXAM:  Base Eye Exam    Visual Acuity (ETDRS)      Right Left   Dist cc 20/50 -1 20/70   Dist ph cc 20/40 20/60 -2   Correction: Glasses       Tonometry (Tonopen, 9:28 AM)      Right Left   Pressure 13 14       Pupils      Pupils Dark Light Shape React APD   Right PERRL 3 2 Round Brisk None   Left PERRL 3 2 Round Brisk None       Visual Fields (Counting fingers)      Left Right    Full Full       Neuro/Psych    Oriented x3: Yes   Mood/Affect: Normal       Dilation    Both eyes: 1.0% Mydriacyl, 2.5% Phenylephrine @ 9:28 AM        Slit Lamp and Fundus Exam    External Exam      Right Left   External Normal Normal       Slit Lamp  Exam      Right Left   Lids/Lashes Normal Normal   Conjunctiva/Sclera White and quiet White and quiet   Cornea Clear Clear   Anterior Chamber Deep and quiet Deep and quiet   Iris Round and reactive Round and reactive   Lens Posterior chamber intraocular lens Posterior chamber intraocular lens   Anterior Vitreous Normal Normal       Fundus Exam      Right Left   Posterior Vitreous  ,, Central vitreous floaters   Disc  collaterals   C/D Ratio  0.4   Macula  Macular thickening less, Microaneurysms, less Cystoid macular edema,  Intraretinal hemorrhage less, large subfoveal druse   Vessels  Hemiretinal vein occlusion superiorly.   Periphery  Normal          IMAGING AND PROCEDURES  Imaging and Procedures for 03/09/21  OCT, Retina - OU - Both Eyes       Right Eye Quality was good. Scan locations included subfoveal. Central Foveal Thickness: 289. Progression has been stable. Findings include normal foveal contour.   Left Eye Quality was good. Scan locations included subfoveal. Central Foveal Thickness: 300. Progression has been stable. Findings include cystoid macular edema, vitreomacular adhesion , vitreous traction.   Notes Outer retinal photoreceptor EZ layer disruption center fovea accounts for the acuity, OS.  But overall CME has stabilized and improved And no foveal distortion from vitreal macular adhesion left eye       Intravitreal Injection, Pharmacologic Agent - OS - Left Eye       Time Out 03/09/2021. 10:38 AM. Confirmed correct patient, procedure, site, and patient consented.   Anesthesia Topical anesthesia was used. Anesthetic medications included Akten 3.5%.   Procedure Preparation included Tobramycin 0.3%, 10% betadine to eyelids, 5% betadine to ocular surface. A 30 gauge needle was used.   Injection:  2.5 mg Bevacizumab (AVASTIN) 2.5mg /0.33mL SOSY   NDC: 98338-250-53, Lot: [id number]   Route: Intravitreal, Site: Left Eye  Post-op Post injection exam  found visual acuity of at least counting fingers. The patient tolerated the procedure well. There were no complications. The patient received written and verbal post procedure care education. Post injection medications were not given.  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  We will repeat injection today and maintain 10-week follow-up for residual CME noted temporal to the fovea.  Signs of collateralization noted with optic nerve collaterals forming OS  Intermediate stage nonexudative age-related macular degeneration of right eye No signs of complication and OD  Vitreomacular adhesion of left eye No foveal distortion by OCT or exam      ICD-10-CM   1. 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 Intermediate stage nonexudative age-related macular degeneration of right eye  H35.3112   3. Vitreomacular adhesion of left eye  H43.822     1.  Overall OS improved on current 10-week follow-up post Avastin will repeat injection today and maintain follow-up in 10 weeks  2.  3.  Ophthalmic Meds Ordered this visit:  Meds ordered this encounter  Medications  . bevacizumab (AVASTIN) SOSY 2.5 mg       Return in about 10 weeks (around 05/18/2021) for dilate, OS, AVASTIN OCT.  There are no Patient Instructions on file for this visit.   Explained the diagnoses, plan, and follow up with the patient and they expressed understanding.  Patient expressed understanding of the importance of proper follow up care.   Clent Demark Gunner Iodice M.D. Diseases & Surgery of the Retina and Vitreous Retina & Diabetic [place] 03/09/21     Abbreviations: M myopia (nearsighted);  A astigmatism; H hyperopia (farsighted); P presbyopia; Mrx spectacle prescription;  CTL contact lenses; OD right eye; OS left eye; OU both eyes  XT exotropia; ET esotropia; PEK punctate epithelial keratitis; PEE punctate epithelial erosions; DES dry eye syndrome; MGD meibomian gland dysfunction; ATs artificial tears; PFAT's preservative free artificial tears; [employer] nuclear sclerotic cataract; PSC posterior subcapsular cataract; ERM epi-retinal membrane; PVD posterior vitreous detachment; RD retinal detachment; DM diabetes mellitus; DR diabetic retinopathy; NPDR non-proliferative diabetic retinopathy; PDR proliferative diabetic retinopathy; CSME clinically significant macular edema; DME diabetic macular edema; dbh dot blot hemorrhages; CWS cotton wool spot; POAG primary open angle glaucoma; C/D cup-to-disc ratio; HVF humphrey visual field; GVF goldmann visual field; OCT optical coherence tomography; IOP intraocular pressure; BRVO Branch retinal vein occlusion; CRVO central retinal vein occlusion; CRAO central retinal artery occlusion; BRAO branch retinal artery occlusion; RT retinal tear; SB scleral buckle; PPV pars plana vitrectomy; VH Vitreous hemorrhage; PRP panretinal laser photocoagulation; IVK intravitreal kenalog; VMT vitreomacular traction; MH Macular hole;  NVD neovascularization of the disc; NVE neovascularization elsewhere; AREDS age related eye disease study; ARMD age related macular degeneration; POAG primary open angle glaucoma; EBMD epithelial/anterior basement membrane dystrophy; ACIOL anterior chamber intraocular lens; IOL intraocular lens; PCIOL posterior chamber intraocular lens; Phaco/IOL phacoemulsification with intraocular lens placement; [place] photorefractive keratectomy; LASIK laser assisted in situ keratomileusis; HTN hypertension; DM diabetes mellitus; COPD chronic obstructive pulmonary disease

## 2021-03-09 NOTE — Assessment & Plan Note (Signed)
No foveal distortion by OCT or exam

## 2021-03-09 NOTE — Assessment & Plan Note (Signed)
No signs of complication and OD

## 2021-03-09 NOTE — Assessment & Plan Note (Signed)
CME with center involvement has improved nicely on intravitreal Avastin currently at 10-week follow-up.  We will repeat injection today and maintain 10-week follow-up for residual CME noted temporal to the fovea.  Signs of collateralization noted with optic nerve collaterals forming OS

## 2021-03-10 ENCOUNTER — Encounter (INDEPENDENT_AMBULATORY_CARE_PROVIDER_SITE_OTHER): Payer: Medicare Other | Admitting: Ophthalmology

## 2021-03-15 ENCOUNTER — Other Ambulatory Visit: Payer: Self-pay | Admitting: Family Medicine

## 2021-04-05 ENCOUNTER — Telehealth: Payer: Medicare Other | Admitting: Nurse Practitioner

## 2021-04-07 ENCOUNTER — Encounter: Payer: Self-pay | Admitting: Nurse Practitioner

## 2021-04-07 ENCOUNTER — Telehealth (INDEPENDENT_AMBULATORY_CARE_PROVIDER_SITE_OTHER): Payer: Medicare Other | Admitting: Nurse Practitioner

## 2021-04-07 ENCOUNTER — Other Ambulatory Visit: Payer: Self-pay

## 2021-04-07 DIAGNOSIS — Z0000 Encounter for general adult medical examination without abnormal findings: Secondary | ICD-10-CM

## 2021-04-07 NOTE — [facility] Notes (Signed)
Patient: Devin Valdez, Male    DOB: May 18, 1941, 80 y.o.   MRN: [id number]  Visit Date: 04/07/2021  Today's Provider: Eulogio Bear, NP   No chief complaint on file.   Subjective:   Kreed P Pokorski is a 80 y.o. male who presents today for his Subsequent Annual Wellness Visit.  Care Team:   Primary Care - Jenna Luo, MD Retina Specialist - Deloria Lair, MD Neurology  - Marcial Pacas, MD  HPI   HTN - Currently taking losartan 50 mg daily and tolerating this well.  Does not check BP at home but would like to.   Anemia - taking iron tablets twice daily, no issues with constipation.  History of CVA - follows with Neurology.    Review of Systems  Past Medical History:  Diagnosis Date  . Anemia   . Anxiety   . 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 Depression   . Falls   . Hallucinations   . Hyperlipidemia   . Hypertension   . Stroke [facility])    left paracentral pons infarct    Past Surgical History:  Procedure Laterality Date  . EYE SURGERY    . HERNIA REPAIR    . POSTERIOR CERVICAL LAMINECTOMY     with fusion C3-C7 10/19- Dr. Brigitte Pulse at [facility] History  Problem Relation Age of Onset  . Multiple sclerosis Son   . Bipolar disorder Son     Social History   Socioeconomic History  . Marital status: Married    Spouse name: Not on file  . Number of children: Not on file  . Years of education: Not on file  . Highest education level: Not on file  Occupational History  . Not on file  Tobacco Use  . Smoking status: Former Smoker    Quit date: 11/21/1985    Years since quitting: 35.4  . Smokeless tobacco: Never Used  Substance and Sexual Activity  . Alcohol use: No  . Drug use: No  . Sexual activity: Yes  Other Topics Concern  . Not on file  Social History Narrative  . Not on file   Social Determinants of Health   Financial Resource Strain: Not on file  Food Insecurity: Not on file  Transportation Needs: Not on file   Physical Activity: Not on file  Stress: Not on file  Social Connections: Not on file  Intimate Partner Violence: Not on file    Outpatient Encounter Medications as of 04/07/2021  Medication Sig  . acetaminophen (TYLENOL) 325 MG tablet Take 650 mg by mouth every 6 (six) hours as needed for mild pain or headache.  Marland Kitchen aspirin 81 MG chewable tablet Chew by mouth daily.  Marland Kitchen atorvastatin (LIPITOR) 40 MG tablet TAKE ONE TABLET (40MG  TOTAL) BY MOUTH DAILY AT 6PM  . Coenzyme Q10 (CO Q-10) 200 MG CAPS Take by mouth daily.  . ferrous sulfate 325 (65 FE) MG tablet Take 1 tablet (325 mg total) by mouth 2 (two) times daily with a meal. (Patient not taking: Reported on 12/18/2020)  . losartan (COZAAR) 50 MG tablet TAKE ONE (1) TABLET BY MOUTH EVERY DAY FOR BLOOD PRESSURE  . Multiple Vitamins-Minerals (CENTRUM SILVER ADULT 50+) TABS Take 1 capsule by mouth 2 (two) times daily.  . sildenafil (VIAGRA) 100 MG tablet TAKE ONE TABLET (100MG  TOTAL) BY MOUTH DAILY AS NEEDED FOR ERECTILE DYSFUNCTION (Patient taking differently: Take 100 mg by mouth as needed for erectile dysfunction.)  . [  DISCONTINUED] gabapentin (NEURONTIN) 100 MG capsule Take 1 capsule (100 mg total) by mouth 3 (three) times daily. (Patient not taking: Reported on 12/18/2020)   No facility-administered encounter medications on file as of 04/07/2021.   Functional Status Survey: Is the patient deaf or have difficulty hearing?: No Does the patient have difficulty seeing, even when wearing glasses/contacts?: No Does the patient have difficulty concentrating, remembering, or making decisions?: Yes (wife is caregiver; helps when forgets) Does the patient have difficulty walking or climbing stairs?: No Does the patient have difficulty dressing or bathing?: No Does the patient have difficulty doing errands alone such as visiting a doctor's office or shopping?: Yes (wife goes with)   Fall Risk Assessment Fall Risk  04/07/2021 12/18/2020 05/09/2019 06/12/2018  05/25/2017  Falls in the past year? 0 0 0 No Yes  Number falls in past yr: 0 0 - - 1  Injury with Fall? 0 0 - - No  Risk for fall due to : - No Fall Risks - - -  Follow up - Falls evaluation completed Falls evaluation completed - -   Depression Screen Depression screen [facility] 2/9 04/07/2021 05/09/2019 06/12/2018  Decreased Interest 0 1 0  Down, Depressed, Hopeless 0 1 0  PHQ - 2 Score 0 2 0  Altered sleeping - 0 -  Tired, decreased energy - 0 -  Change in appetite - 0 -  Feeling bad or failure about yourself  - 0 -  Trouble concentrating - 1 -  Moving slowly or fidgety/restless - 0 -  Suicidal thoughts - 0 -  PHQ-9 Score - 3 -  Difficult doing work/chores - Somewhat difficult -   6CIT Screen 04/07/2021  What Year? 0 points  What month? 0 points  What time? 0 points  Count back from 20 0 points  Months in reverse 0 points  Repeat phrase 0 points  Total Score 0   Advanced Directives Does patient have a HCPOA?    no Does patient have a living will or MOST form?  no  Objective:   Vitals: There were no vitals taken for this visit. There is no height or weight on file to calculate BMI. No exam data present  Physical Exam   Assessment & Plan:     Annual Wellness Visit  Reviewed patient's Family Medical History Reviewed and updated list of patient's medical providers Assessment of cognitive impairment was done Assessed patient's functional ability Established a written schedule for health screening [place] Completed and Reviewed  Health Maintenance reviewed - encouraged COVID-19 booster and pneumonia vaccine; patient declines pneumonia vaccine, reports he will schedule booster.  Immunization History  Administered Date(s) Administered  . Fluad Quad(high Dose 65+) 08/06/2019    Health Maintenance  Topic Date Due  . COVID-19 Vaccine (1) Never done  . Hepatitis C Screening  Never done  . TETANUS/TDAP  Never done  . PNA vac Low Risk Adult (1 of 2 -  PCV13) Never done  . INFLUENZA VACCINE  06/21/2021  . HPV VACCINES  Aged Out    Discussed health benefits of physical activity, and encouraged him to engage in regular exercise appropriate for his age and condition.   No orders of the defined types were placed in this encounter.   Current Outpatient Medications:  .  acetaminophen (TYLENOL) 325 MG tablet, Take 650 mg by mouth every 6 (six) hours as needed for mild pain or headache., Disp: , Rfl:  .  aspirin 81 MG chewable tablet, Chew  by mouth daily., Disp: , Rfl:  .  atorvastatin (LIPITOR) 40 MG tablet, TAKE ONE TABLET (40MG  TOTAL) BY MOUTH DAILY AT 6PM, Disp: 90 tablet, Rfl: 1 .  Coenzyme Q10 (CO Q-10) 200 MG CAPS, Take by mouth daily., Disp: , Rfl:  .  ferrous sulfate 325 (65 FE) MG tablet, Take 1 tablet (325 mg total) by mouth 2 (two) times daily with a meal. (Patient not taking: Reported on 12/18/2020), Disp: 60 tablet, Rfl: 1 .  losartan (COZAAR) 50 MG tablet, TAKE ONE (1) TABLET BY MOUTH EVERY DAY FOR BLOOD PRESSURE, Disp: 90 tablet, Rfl: 3 .  Multiple Vitamins-Minerals (CENTRUM SILVER ADULT 50+) TABS, Take 1 capsule by mouth 2 (two) times daily., Disp: 30 tablet, Rfl: 2 .  sildenafil (VIAGRA) 100 MG tablet, TAKE ONE TABLET (100MG  TOTAL) BY MOUTH DAILY AS NEEDED FOR ERECTILE DYSFUNCTION (Patient taking differently: Take 100 mg by mouth as needed for erectile dysfunction.), Disp: 10 tablet, Rfl: 5 Medications Discontinued During This Encounter  Medication Reason  . gabapentin (NEURONTIN) 100 MG capsule Discontinued by provider    Next Medicare Wellness Visit in 12+ months  This visit was completed via telephone due to the restrictions of the COVID-19 pandemic. All issues as above were discussed and addressed but no physical exam was performed. If it was felt that the patient should be evaluated in the office, they were directed there. The patient verbally consented to this visit. Patient was unable to complete an audio/visual visit due  to Technical difficulties. . Location of the patient: home . Location of the provider: work . Those involved with this call:  . Provider: Noemi Chapel, DNP, FNP-C . CMA: n/a . Front Desk/Registration: Vevelyn Pat  . Time spent on call: 18 minutes on the phone discussing health concerns. 20 minutes total spent in review of patient's record and preparation of their chart.  I verified patient identity using two factors (patient name and date of birth). Patient consents verbally to being seen via telemedicine visit today.

## 2021-05-03 ENCOUNTER — Telehealth: Payer: Self-pay | Admitting: Family Medicine

## 2021-05-03 NOTE — Chronic Care Management (AMB) (Signed)
  Chronic Care Management   Note  05/03/2021 Name: XZAVIAN SEMMEL MRN: [id number] DOB: [birth year]  Kacy P Watchman is a 80 y.o. year old male who is a primary care patient of Pickard, Cammie Mcgee, MD. I reached out to [employer] by phone today in response to a referral sent by Mr. Jace P Castrellon's PCP, [name], MD.   Mr. Glasheen was given information about Chronic Care Management services today including:  CCM service includes personalized support from designated clinical staff supervised by his physician, including individualized plan of care and coordination with other care providers 24/7 contact phone numbers for assistance for urgent and routine care needs. Service will only be billed when office clinical staff spend 20 minutes or more in a month to coordinate care. Only one practitioner may furnish and bill the service in a calendar month. The patient may stop CCM services at any time (effective at the end of the month) by phone call to the office staff.   Patient agreed to services and verbal consent obtained.   Follow up plan:   Tatjana Secretary/administrator

## 2021-05-18 ENCOUNTER — Encounter (INDEPENDENT_AMBULATORY_CARE_PROVIDER_SITE_OTHER): Payer: Medicare Other | Admitting: Ophthalmology

## 2021-05-25 ENCOUNTER — Ambulatory Visit (INDEPENDENT_AMBULATORY_CARE_PROVIDER_SITE_OTHER): Payer: Medicare Other | Admitting: Ophthalmology

## 2021-05-25 ENCOUNTER — Encounter (INDEPENDENT_AMBULATORY_CARE_PROVIDER_SITE_OTHER): Payer: Self-pay | Admitting: Ophthalmology

## 2021-05-25 ENCOUNTER — Other Ambulatory Visit: Payer: Self-pay

## 2021-05-25 DIAGNOSIS — Z20822 Contact with and (suspected) exposure to covid-19: Secondary | ICD-10-CM | POA: Diagnosis not present

## 2021-05-25 DIAGNOSIS — H34812 Central retinal vein occlusion, left eye, with macular edema: Secondary | ICD-10-CM

## 2021-05-25 MED ORDER — BEVACIZUMAB 2.5 MG/0.1ML IZ SOSY
2.5000 mg | PREFILLED_SYRINGE | INTRAVITREAL | Status: AC | PRN
  Administered 2021-05-25: 2.5 mg via INTRAVITREAL

## 2021-05-25 NOTE — [facility] Notes (Signed)
05/25/2021     CHIEF COMPLAINT Patient presents for Retina Follow Up (10 week fu OS and Avastin OS/Pt states VA OU stable since last visit. Pt denies FOL, floaters, or ocular pain OU. /)   HISTORY OF PRESENT ILLNESS: Devin Valdez is a 80 y.o. male who presents to the clinic today for:   HPI     Retina Follow Up           Diagnosis: Other   Laterality: left eye   Onset: 10 weeks ago   Severity: mild   Duration: 10 weeks   Course: stable   Comments: 10 week fu OS and Avastin OS Pt states VA OU stable since last visit. Pt denies FOL, floaters, or ocular pain OU.         Last edited by Kendra Opitz, COA on 05/25/2021  9:00 AM.      Referring physician: [name], MD [id number] [facility] [address] [place],  [place] [address]  HISTORICAL INFORMATION:   Selected notes from the MEDICAL RECORD NUMBER    Lab Results  Component Value Date   HGBA1C 4.6 (L) 01/14/2020     CURRENT MEDICATIONS: No current outpatient medications on file. (Ophthalmic Drugs)   No current facility-administered medications for this visit. (Ophthalmic Drugs)   Current Outpatient Medications (Other)  Medication Sig   acetaminophen (TYLENOL) 325 MG tablet Take 650 mg by mouth every 6 (six) hours as needed for mild pain or headache.   aspirin 81 MG chewable tablet Chew by mouth daily.   atorvastatin (LIPITOR) 40 MG tablet TAKE ONE TABLET (40MG  TOTAL) BY MOUTH DAILY AT 6PM   Coenzyme Q10 (CO Q-10) 200 MG CAPS Take by mouth daily.   ferrous sulfate 325 (65 FE) MG tablet Take 1 tablet (325 mg total) by mouth 2 (two) times daily with a meal. (Patient not taking: Reported on 12/18/2020)   losartan (COZAAR) 50 MG tablet TAKE ONE (1) TABLET BY MOUTH EVERY DAY FOR BLOOD PRESSURE   Multiple Vitamins-Minerals (CENTRUM SILVER ADULT 50+) TABS Take 1 capsule by mouth 2 (two) times daily.   sildenafil (VIAGRA) 100 MG tablet TAKE ONE TABLET (100MG  TOTAL) BY MOUTH DAILY AS NEEDED FOR ERECTILE DYSFUNCTION  (Patient taking differently: Take 100 mg by mouth as needed for erectile dysfunction.)   No current facility-administered medications for this visit. (Other)      REVIEW OF SYSTEMS:    ALLERGIES No Known Allergies  PAST MEDICAL HISTORY Past Medical History:  Diagnosis Date   Anemia    Anxiety    Cervical stenosis of spinal canal    mri 8/19- severe   Depression    Falls    Hallucinations    Hyperlipidemia    Hypertension    Stroke ([place])    left paracentral pons infarct   Past Surgical History:  Procedure Laterality Date   EYE SURGERY     HERNIA REPAIR     POSTERIOR CERVICAL LAMINECTOMY     with fusion C3-C7 10/19- Dr. Brigitte Pulse at [facility] Family History  Problem Relation Age of Onset   Multiple sclerosis Son    Bipolar disorder Son     SOCIAL HISTORY Social History   Tobacco Use   Smoking status: Former    Pack years: 0.00    Types: Cigarettes    Quit date: 11/21/1985    Years since quitting: 35.5   Smokeless tobacco: Never  Substance Use Topics   Alcohol use:  No   Drug use: No         OPHTHALMIC EXAM:  Base Eye Exam     Visual Acuity (ETDRS)       Right Left   Dist cc 20/60 20/70 +2   Dist ph cc 20/40 -2 20/60 -2    Correction: Glasses         Tonometry (Tonopen, 9:05 AM)       Right Left   Pressure 17 17         Pupils       Pupils Dark Light Shape React APD   Right PERRL 3 3 Round Minimal None   Left PERRL 3 2 Round Brisk None         Visual Fields (Counting fingers)       Left Right    Full Full         Extraocular Movement       Right Left    Full Full         Neuro/Psych     Oriented x3: Yes   Mood/Affect: Normal         Dilation     Left eye: 1.0% Mydriacyl, 2.5% Phenylephrine @ 9:05 AM           Slit Lamp and Fundus Exam     External Exam       Right Left   External Normal Normal         Slit Lamp Exam       Right Left   Lids/Lashes Normal Normal    Conjunctiva/Sclera White and quiet White and quiet   Cornea Clear Clear   Anterior Chamber Deep and quiet Deep and quiet   Iris Round and reactive Round and reactive   Lens Posterior chamber intraocular lens Posterior chamber intraocular lens   Anterior Vitreous Normal Normal         Fundus Exam       Right Left   Posterior Vitreous  ,, Central vitreous floaters   Disc  collaterals   C/D Ratio  0.4   Macula  Macular thickening less, Microaneurysms, less Cystoid macular edema,  Intraretinal hemorrhage less, large subfoveal druse   Vessels  Hemiretinal vein occlusion superiorly.   Periphery  Normal            IMAGING AND PROCEDURES  Imaging and Procedures for 05/25/21  OCT, Retina - OU - Both Eyes       Right Eye Quality was good. Scan locations included subfoveal. Central Foveal Thickness: 289. Progression has been stable. Findings include normal foveal contour.   Left Eye Quality was good. Scan locations included subfoveal. Central Foveal Thickness: 298. Progression has been stable. Findings include cystoid macular edema, vitreomacular adhesion , vitreous traction.   Notes Outer retinal photoreceptor EZ layer disruption center fovea accounts for the acuity, OS.  But overall CME has stabilized and improved And no foveal distortion from vitreal macular adhesion left eye     Intravitreal Injection, Pharmacologic Agent - OS - Left Eye       Time Out 05/25/2021. 9:22 AM. Confirmed correct patient, procedure, site, and patient consented.   Anesthesia Topical anesthesia was used. Anesthetic medications included Akten 3.5%.   Procedure Preparation included Tobramycin 0.3%, 10% betadine to eyelids, 5% betadine to ocular surface. A 30 gauge needle was used.   Injection: 2.5 mg bevacizumab 2.5 MG/0.1ML   Route: Intravitreal, Site: Left Eye   NDC: [phone], Lot: [id number]   Post-op Post injection  exam found visual acuity of at least counting fingers. The patient  tolerated the procedure well. There were no complications. The patient received written and verbal post procedure care education. Post injection medications were not given.              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  Less involved center CME.  Currently at 11-week follow-up.  We will maintain to allow further ongoing resolution of CME     ICD-10-CM   1. 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  OS with much less center involved CME from hemispheric CRV O.  Signs of collateralization is noted clinically as well as on OCT.  We will maintain 11-week follow-up next visit post Avastin injection OS today  2.  3.  Ophthalmic Meds Ordered this visit:  Meds ordered this encounter  Medications   bevacizumab (AVASTIN) SOSY 2.5 mg       Return in about 11 weeks (around 08/10/2021) for DILATE OU, COLOR FP, AVASTIN OCT, OS.  There are no Patient Instructions on file for this visit.   Explained the diagnoses, plan, and follow up with the patient and they expressed understanding.  Patient expressed understanding of the importance of proper follow up care.   Clent Demark Johnni Wunschel M.D. Diseases & Surgery of the Retina and Vitreous [facility] [place] 05/25/21     Abbreviations: M myopia (nearsighted); A astigmatism; H hyperopia (farsighted); P presbyopia; Mrx spectacle prescription;  CTL contact lenses; OD right eye; OS left eye; OU both eyes  XT exotropia; ET esotropia; PEK punctate epithelial keratitis; PEE punctate epithelial erosions; DES dry eye syndrome; MGD meibomian gland dysfunction; ATs artificial tears; PFAT's preservative  free artificial tears; [place] nuclear sclerotic cataract; PSC posterior subcapsular cataract; ERM epi-retinal membrane; PVD posterior vitreous detachment; RD retinal detachment; DM diabetes mellitus; DR diabetic retinopathy; NPDR non-proliferative diabetic retinopathy; PDR proliferative diabetic retinopathy; CSME clinically significant macular edema; DME diabetic macular edema; dbh dot blot hemorrhages; CWS cotton wool spot; POAG primary open angle glaucoma; C/D cup-to-disc ratio; HVF humphrey visual field; GVF goldmann visual field; OCT optical coherence tomography; IOP intraocular pressure; BRVO Branch retinal vein occlusion; CRVO central retinal vein occlusion; CRAO central retinal artery occlusion; BRAO branch retinal artery occlusion; RT retinal tear; SB scleral buckle; PPV pars plana vitrectomy; VH Vitreous hemorrhage; PRP panretinal laser photocoagulation; IVK intravitreal kenalog; VMT vitreomacular traction; MH Macular hole;  NVD neovascularization of the disc; NVE neovascularization elsewhere; AREDS age related eye disease study; ARMD age related macular degeneration; POAG primary open angle glaucoma; EBMD epithelial/anterior basement membrane dystrophy; ACIOL anterior chamber intraocular lens; IOL intraocular lens; PCIOL posterior chamber intraocular lens; Phaco/IOL phacoemulsification with intraocular lens placement; [place] photorefractive keratectomy; LASIK laser assisted in situ keratomileusis; HTN hypertension; DM diabetes mellitus; COPD chronic obstructive pulmonary disease

## 2021-05-25 NOTE — Assessment & Plan Note (Signed)
Hemi central retinal vein occlusion, with region temporally with CME yet improving and apparent collateralization developing.  Less involved center CME.  Currently at 11-week follow-up.  We will maintain to allow further ongoing resolution of CME

## 2021-06-22 ENCOUNTER — Telehealth: Payer: Self-pay | Admitting: Pharmacist

## 2021-06-22 NOTE — [facility] Notes (Addendum)
    Chronic Care Management Pharmacy Assistant   Name: Devin Valdez  MRN: [id number] DOB: [birth year]  Devin Valdez is an 80 y.o. year old male who presents for his initial CCM visit with the clinical pharmacist.  Reason for Encounter: Chart Prep    Conditions to be addressed/monitored: HTN, CVA, STROKE, Depression.  Primary concerns for visit include: HTN  Recent office visits:  04/07/21 (Video Visit) April Manson, NP. For initial annual wellness visit. STOPPED Gabapentin.  Recent consult visits:  05/25/21 Ophthalmology Zadie Rhine Shawna Orleans, MD. For follow-up. No medication changes. 03/09/21 Ophthalmology Zadie Rhine, Shawna Orleans, MD. For follow-up. No medication changes. 12/30/20 Ophthalmology Rankin, Shawna Orleans, MD. For hemispheric retinal. vein No medication changes.   Hospital visits:  None in previous 6 months  Medication History: Atorvastatin 40 mg 90 DS 06/14/21 Losartan 50 mg 90 DS 05/17/21  Medications: Outpatient Encounter Medications as of 06/22/2021  Medication Sig   acetaminophen (TYLENOL) 325 MG tablet Take 650 mg by mouth every 6 (six) hours as needed for mild pain or headache.   aspirin 81 MG chewable tablet Chew by mouth daily.   atorvastatin (LIPITOR) 40 MG tablet TAKE ONE TABLET (40MG  TOTAL) BY MOUTH DAILY AT 6PM   Coenzyme Q10 (CO Q-10) 200 MG CAPS Take by mouth daily.   ferrous sulfate 325 (65 FE) MG tablet Take 1 tablet (325 mg total) by mouth 2 (two) times daily with a meal. (Patient not taking: Reported on 12/18/2020)   losartan (COZAAR) 50 MG tablet TAKE ONE (1) TABLET BY MOUTH EVERY DAY FOR BLOOD PRESSURE   Multiple Vitamins-Minerals (CENTRUM SILVER ADULT 50+) TABS Take 1 capsule by mouth 2 (two) times daily.   sildenafil (VIAGRA) 100 MG tablet TAKE ONE TABLET (100MG  TOTAL) BY MOUTH DAILY AS NEEDED FOR ERECTILE DYSFUNCTION (Patient taking differently: Take 100 mg by mouth as needed for erectile dysfunction.)   No facility-administered encounter medications on  file as of 06/22/2021.    Have you seen any other providers since your last visit? Patient stated no.  Any changes in your medications or health? Patient state no.  Any side effects from any medications? Patient stated no.  Do you have an symptoms or problems not managed by your medications? Patient stated no.  Any concerns about your health right now? Patient stated no.  Has your provider asked that you check blood pressure, blood sugar, or follow special diet at home? Patient stated he checks his blood pressure.  Do you get any type of exercise on a regular basis? Patient stated no.   Can you think of a goal you would like to reach for your health? Patient stated he would like to get around better and sleep better.  Do you have any problems getting your medications? Patient stated no.  Is there anything that you would like to discuss during the appointment? Patient stated he would like to know why he has to take his atorvastatin at 6 pm only.   Please bring medications and supplements to appointment, patient reminded of his face to face appointment on 06/24/21 at 330 pm.   Follow-Up:Pharmacist Review  Charlann Lange, [place] Pharmacist Assistant [phone]

## 2021-06-24 ENCOUNTER — Other Ambulatory Visit: Payer: Self-pay

## 2021-06-24 ENCOUNTER — Ambulatory Visit (INDEPENDENT_AMBULATORY_CARE_PROVIDER_SITE_OTHER): Payer: Medicare Other | Admitting: Pharmacist

## 2021-06-24 DIAGNOSIS — I10 Essential (primary) hypertension: Secondary | ICD-10-CM

## 2021-06-24 DIAGNOSIS — D509 Iron deficiency anemia, unspecified: Secondary | ICD-10-CM

## 2021-06-24 NOTE — Progress Notes (Signed)
Chronic Care Management Pharmacy Note  06/25/2021 Name:  Devin Valdez MRN:  [id number] DOB:  [birth year]  Summary: Initial visit with PharmD.  Patient meds reviewed.  Counseled on timing of statin meds, increased physical activity and taking his iron supplement with Vitmain C.  Recommendations/Changes made from today's visit: Walk with wife a few times per week. Take iron with Vitamin C.  Plan: FU with PharmD 6 months   Subjective: Devin Valdez is an 80 y.o. year old male who is a primary patient of Pickard, Cammie Mcgee, MD.  The CCM team was consulted for assistance with disease management and care coordination needs.    Engaged with patient face to face for initial visit in response to provider referral for pharmacy case management and/or care coordination services.   Consent to Services:  The patient was given the following information about Chronic Care Management services today, agreed to services, and gave verbal consent: 1. CCM service includes personalized support from designated clinical staff supervised by the primary care provider, including individualized plan of care and coordination with other care providers 2. 24/7 contact phone numbers for assistance for urgent and routine care needs. 3. Service will only be billed when office clinical staff spend 20 minutes or more in a month to coordinate care. 4. Only one practitioner may furnish and bill the service in a calendar month. 5.The patient may stop CCM services at any time (effective at the end of the month) by phone call to the office staff. 6. The patient will be responsible for cost sharing (co-pay) of up to 20% of the service fee (after annual deductible is met). Patient agreed to services and consent obtained.  Patient Care Team: [name], MD as PCP - General (Family Medicine) Edythe Clarity, [facility] as Pharmacist (Pharmacist)  Recent office visits:  04/07/21 (Video Visit) April Manson, NP. For  initial annual wellness visit. STOPPED Gabapentin.   Recent consult visits:  05/25/21 Ophthalmology Zadie Rhine Shawna Orleans, MD. For follow-up. No medication changes. 03/09/21 Ophthalmology Zadie Rhine, Shawna Orleans, MD. For follow-up. No medication changes. 12/30/20 Ophthalmology [place], Shawna Orleans, MD. For hemispheric retinal. vein No medication changes.    Hospital visits:  None in previous 6 months   Medication History: Atorvastatin 40 mg 90 DS 06/14/21 Losartan 50 mg 90 DS 05/17/21     Objective:  Lab Results  Component Value Date   CREATININE 1.28 (H) 12/18/2020   BUN 22 12/18/2020   GFRNONAA 53 (L) 12/18/2020   GFRAA 61 12/18/2020   NA 137 12/18/2020   K 4.2 12/18/2020   CALCIUM 9.5 12/18/2020   CO2 25 12/18/2020   GLUCOSE 102 (H) 12/18/2020    Lab Results  Component Value Date/Time   HGBA1C 4.6 (L) 01/14/2020 04:14 AM   HGBA1C 4.7 (L) 12/21/2019 02:24 AM    Last diabetic Eye exam: No results found for: HMDIABEYEEXA  Last diabetic Foot exam: No results found for: HMDIABFOOTEX   Lab Results  Component Value Date   CHOL 119 12/18/2020   HDL 46 12/18/2020   LDLCALC 57 12/18/2020   TRIG 80 12/18/2020   CHOLHDL 2.6 12/18/2020    Hepatic Function Latest Ref Rng & Units 12/18/2020 03/30/2020 01/20/2020  Total Protein 6.1 - 8.1 g/dL 6.1 6.6 6.5  Albumin 3.5 - 5.0 g/dL - - -  AST 10 - 35 U/L $Remo'22 15 15  'DiVHc$ ALT 9 - 46 U/L $Remo'25 18 22  'HCfdV$ Alk Phosphatase 38 - 126 U/L - - -  Total Bilirubin 0.2 - 1.2 mg/dL 0.4 0.6 0.5    Lab Results  Component Value Date/Time   TSH 2.11 12/19/2019 03:08 PM   TSH 1.46 11/29/2018 12:10 PM    CBC Latest Ref Rng & Units 12/18/2020 03/30/2020 01/20/2020  WBC 3.8 - 10.8 Thousand/uL 5.2 6.2 6.1  Hemoglobin 13.2 - 17.1 g/dL 11.3(L) 11.3(L) 11.8(L)  Hematocrit 38.5 - 50.0 % 34.0(L) 34.9(L) 35.8(L)  Platelets 140 - 400 Thousand/uL 206 247 233    No results found for: VD25OH  Clinical ASCVD: Yes  The ASCVD Risk score Mikey Bussing DC Jr., et al., 2013) failed to calculate for the  following reasons:   The 2013 ASCVD risk score is only valid for ages 59 to 64   The patient has a prior MI or stroke diagnosis    Depression screen [facility] 2/9 04/07/2021 05/09/2019 06/12/2018  Decreased Interest 0 1 0  Down, Depressed, Hopeless 0 1 0  PHQ - 2 Score 0 2 0  Altered sleeping - 0 -  Tired, decreased energy - 0 -  Change in appetite - 0 -  Feeling bad or failure about yourself  - 0 -  Trouble concentrating - 1 -  Moving slowly or fidgety/restless - 0 -  Suicidal thoughts - 0 -  PHQ-9 Score - 3 -  Difficult doing work/chores - Somewhat difficult -     Social History   Tobacco Use  Smoking Status Former   Types: Cigarettes   Quit date: 11/21/1985   Years since quitting: 35.6  Smokeless Tobacco Never   BP Readings from Last 3 Encounters:  12/18/20 (!) 144/82  08/25/20 125/68  07/13/20 (!) 120/52   Pulse Readings from Last 3 Encounters:  12/18/20 70  08/25/20 75  07/13/20 66   Wt Readings from Last 3 Encounters:  12/18/20 151 lb (68.5 kg)  08/25/20 147 lb (66.7 kg)  07/13/20 141 lb (64 kg)   BMI Readings from Last 3 Encounters:  12/18/20 23.65 kg/m  08/25/20 23.02 kg/m  07/13/20 22.08 kg/m    Assessment/Interventions: Review of patient past medical history, allergies, medications, health status, including review of consultants reports, laboratory and other test data, was performed as part of comprehensive evaluation and provision of chronic care management services.   SDOH:  (Social Determinants of Health) assessments and interventions performed: Yes  Financial Resource Strain: Low Risk    Difficulty of Paying Living Expenses: Not hard at all    SDOH Screenings   Alcohol Screen: Not on file  Depression (PHQ2-9): Low Risk    PHQ-2 Score: 0  Financial Resource Strain: Low Risk    Difficulty of Paying Living Expenses: Not hard at all  Food Insecurity: Not on file  Housing: Not on file  Physical Activity: Not on file  Social Connections: Not on file   Stress: Not on file  Tobacco Use: Medium Risk   Smoking Tobacco Use: Former   Smokeless Tobacco Use: Never  Transportation Needs: Not on file    [place]  No Known Allergies  Medications Reviewed Today     Reviewed by Edythe Clarity, [place] (Pharmacist) on 06/25/21 at 1516  Med List Status: <None>   Medication Order Taking? Sig Documenting Provider Last Dose Status Informant  acetaminophen (TYLENOL) 325 MG tablet [id number] No Take 650 mg by mouth every 6 (six) hours as needed for mild pain or headache. [provider] Taking Active Self  aspirin 81 MG chewable tablet [id number] No Chew by mouth daily. [provider] Taking Active   atorvastatin (LIPITOR) 40 MG tablet [id number]  TAKE ONE TABLET ($RemoveBef'40MG'XntgFDzPnQ$  TOTAL) BY MOUTH DAILY AT Adele Schilder, MD  Active   Coenzyme Q10 (CO Q-10) 200 MG CAPS [id number] No Take by mouth daily. [provider] Taking Active   ferrous sulfate 325 (65 FE) MG tablet [id number] No Take 1 tablet (325 mg total) by mouth 2 (two) times daily with a meal.  Patient not taking: Reported on 12/18/2020   Theodis Blaze, MD Not Taking Active Self  losartan (COZAAR) 50 MG tablet [id number] No TAKE ONE (1) TABLET BY MOUTH EVERY DAY FOR BLOOD PRESSURE [name], MD Taking Active   Multiple Vitamins-Minerals (CENTRUM SILVER ADULT 50+) TABS [id number] No Take 1 capsule by mouth 2 (two) times daily. Billy Fischer, MD Taking Active Self  sildenafil (VIAGRA) 100 MG tablet [id number] No TAKE ONE TABLET ($RemoveBef'100MG'DXUnUOROol$  TOTAL) BY MOUTH DAILY AS NEEDED FOR ERECTILE DYSFUNCTION  Patient taking differently: Take 100 mg by mouth as needed for erectile dysfunction.   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[place]) 01/13/2020   Acute right-sided weakness 01/13/2020   Difficulty urinating 01/13/2020   Stroke ([place])    CVA (cerebral vascular accident) ([place]) 12/20/2019   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[place])    PVC (premature ventricular contraction)    Cervical stenosis of spinal canal    Failure to thrive in adult 05/26/2017   Failure to thrive (0-17) 05/25/2017   Abdominal pulsatile mass 05/25/2017   Anemia 05/25/2017   Essential hypertension 05/25/2017   Gait instability 05/20/2017   Frequent falls 05/20/2017   Depression 05/20/2017    Immunization History  Administered Date(s) Administered   Fluad Quad(high Dose 65+) 08/06/2019   Moderna SARS-COV2 Booster Vaccination 01/03/2020   Moderna Sars-Covid-2 Vaccination 12/06/2019    Conditions to be addressed/monitored:  Htn, Hx of Stroke,   Care Plan : General Pharmacy (Adult)  Updates made by Edythe Clarity, RPH since 06/25/2021 12:00 AM     Problem: HTN, Anemia   Priority: High  Onset Date: 06/25/2021     Goal: Patient-Specific Goal        Medication Assistance: None required.  Patient affirms current coverage meets needs.  Compliance/Adherence/Medication fill history: Care Gaps: Zoster Vaccines  Star-Rating Drugs: Atorvastatin 40 mg 90 DS 06/14/21 Losartan 50 mg 90 DS 05/17/21  Patient's preferred pharmacy is:  [facility], [place] [address] Phone: [phone] Fax: [phone]  Uses pill box? No - wife sets pills out Pt endorses 100% compliance  We discussed: Benefits of medication synchronization, packaging and delivery as well as enhanced  pharmacist oversight with [employer]. Patient decided to: Continue current medication management strategy  Care Plan and Follow Up Patient Decision:  Patient agrees to Care Plan and Follow-up.  Plan: The care management team will reach out to the patient again  over the next 180 days.  Beverly Milch, PharmD Clinical Pharmacist [place] [phone]

## 2021-06-25 NOTE — Patient Instructions (Addendum)
Visit Information   Goals Addressed             This Visit's Progress    Track and Manage My Blood Pressure-Hypertension       Timeframe:  Long-Range Goal Priority:  High Start Date:  06/25/21                           Expected End Date: 12/26/21                      Follow Up Date 10/20/21    - check blood pressure 3 times per week - choose a place to take my blood pressure (home, clinic or office, retail store) - write blood pressure results in a log or diary    Why is this important?   You won't feel high blood pressure, but it can still hurt your blood vessels.  High blood pressure can cause heart or kidney problems. It can also cause a stroke.  Making lifestyle changes like losing a little weight or eating less salt will help.  Checking your blood pressure at home and at different times of the day can help to control blood pressure.  If the doctor prescribes medicine remember to take it the way the doctor ordered.  Call the office if you cannot afford the medicine or if there are questions about it.     Notes:        Patient Care Plan: General Pharmacy (Adult)     Problem Identified: HTN, Anemia   Priority: High  Onset Date: 06/25/2021     Long-Range Goal: Patient-Specific Goal   Start Date: 06/24/2021  Expected End Date: 12/26/2021  This Visit's Progress: On track  Priority: High  Note:   Current Barriers:  None at this time  Pharmacist Clinical Goal(s):  Patient will achieve adherence to monitoring guidelines and medication adherence to achieve therapeutic efficacy maintain control of BP as evidenced by home monitoring  contact provider office for questions/concerns as evidenced notation of same in electronic health record through collaboration with PharmD and provider.   Interventions: 1:1 collaboration with [name], MD regarding development and update of comprehensive plan of care as evidenced by provider attestation and  co-signature Inter-disciplinary care team collaboration (see longitudinal plan of care) Comprehensive medication review performed; medication list updated in electronic medical record  Hypertension  (Status:New goal. Goal on track: YES.)   Med Management Intervention: None  (BP goal <130/80) -Controlled -Current treatment: Losartan 50mg  daily -Medications previously tried: HCTZ  -Current home readings: "normal"  -Current exercise habits: minimal -Denies hypotensive/hypertensive symptoms -Educated on BP goals and benefits of medications for prevention of heart attack, stroke and kidney damage; Exercise goal of 150 minutes per week; Importance of home blood pressure monitoring; Symptoms of hypotension and importance of maintaining adequate hydration; -Counseled to monitor BP at home periodically, document, and provide log at future appointments -Recommended to continue current medication Recommended implement some physical activity such as walking with his wife a few times per week for increased cardio activity.  Anemia (Goal: Maintain Hgb) -Not ideally controlled -Current treatment  Ferrous Sulfate 325mg  bid with a meal -Medications previously tried: none noted -Patient mentions constipation issues with iron supplement  -Recommended to continue current medication Counseled on taking with vitamin C to improve absorption.  Patient Goals/Self-Care Activities Patient will:  - take medications as prescribed focus on medication adherence by pill counts check blood pressure periodically,  document, and provide at future appointments  Follow Up Plan: The care management team will reach out to the patient again over the next 180 days.        [name] was given information about Chronic Care Management services today including:  CCM service includes personalized support from designated clinical staff supervised by his physician, including individualized plan of care and  coordination with other care providers 24/7 contact phone numbers for assistance for urgent and routine care needs. Standard insurance, coinsurance, copays and deductibles apply for chronic care management only during months in which we provide at least 20 minutes of these services. Most insurances cover these services at 100%, however patients may be responsible for any copay, coinsurance and/or deductible if applicable. This service may help you avoid the need for more expensive face-to-face services. Only one practitioner may furnish and bill the service in a calendar month. The patient may stop CCM services at any time (effective at the end of the month) by phone call to the office staff.  Patient agreed to services and verbal consent obtained.   The patient verbalized understanding of instructions, educational materials, and care plan provided today and agreed to receive a mailed copy of patient instructions, educational materials, and care plan.  Telephone follow up appointment with pharmacy team member scheduled for: 6 months  Edythe Clarity, [place]

## 2021-07-01 ENCOUNTER — Other Ambulatory Visit: Payer: Self-pay

## 2021-07-01 ENCOUNTER — Encounter: Payer: Self-pay | Admitting: Family Medicine

## 2021-07-01 ENCOUNTER — Ambulatory Visit (INDEPENDENT_AMBULATORY_CARE_PROVIDER_SITE_OTHER): Payer: Medicare Other | Admitting: Family Medicine

## 2021-07-01 VITALS — BP 128/72 | HR 72 | Temp 98.2°F | Resp 14 | Ht 67.0 in | Wt 153.0 lb

## 2021-07-01 DIAGNOSIS — I10 Essential (primary) hypertension: Secondary | ICD-10-CM

## 2021-07-01 DIAGNOSIS — Z8673 Personal history of transient ischemic attack (TIA), and cerebral infarction without residual deficits: Secondary | ICD-10-CM | POA: Diagnosis not present

## 2021-07-01 DIAGNOSIS — E7800 Pure hypercholesterolemia, unspecified: Secondary | ICD-10-CM | POA: Diagnosis not present

## 2021-07-01 DIAGNOSIS — D649 Anemia, unspecified: Secondary | ICD-10-CM | POA: Diagnosis not present

## 2021-07-02 ENCOUNTER — Telehealth: Payer: Self-pay | Admitting: Pharmacist

## 2021-07-02 LAB — CBC WITH DIFFERENTIAL/PLATELET
Absolute Monocytes: 756 cells/uL (ref 200–950)
Basophils Absolute: 22 cells/uL (ref 0–200)
Basophils Relative: 0.4 %
Eosinophils Absolute: 184 cells/uL (ref 15–500)
Eosinophils Relative: 3.4 %
HCT: 33.7 % — ABNORMAL LOW (ref 38.5–50.0)
Hemoglobin: 11.2 g/dL — ABNORMAL LOW (ref 13.2–17.1)
Lymphs Abs: 1139 cells/uL (ref 850–3900)
MCH: 29.5 pg (ref 27.0–33.0)
MCHC: 33.2 g/dL (ref 32.0–36.0)
MCV: 88.7 fL (ref 80.0–100.0)
MPV: 10.6 fL (ref 7.5–12.5)
Monocytes Relative: 14 %
Neutro Abs: 3299 cells/uL (ref 1500–7800)
Neutrophils Relative %: 61.1 %
Platelets: 184 10*3/uL (ref 140–400)
RBC: 3.8 10*6/uL — ABNORMAL LOW (ref 4.20–5.80)
RDW: 11.5 % (ref 11.0–15.0)
Total Lymphocyte: 21.1 %
WBC: 5.4 10*3/uL (ref 3.8–10.8)

## 2021-07-02 LAB — COMPLETE METABOLIC PANEL WITH GFR
AG Ratio: 2 (calc) (ref 1.0–2.5)
ALT: 24 U/L (ref 9–46)
AST: 20 U/L (ref 10–35)
Albumin: 4.3 g/dL (ref 3.6–5.1)
Alkaline phosphatase (APISO): 81 U/L (ref 35–144)
BUN/Creatinine Ratio: 17 (calc) (ref 6–22)
BUN: 22 mg/dL (ref 7–25)
CO2: 29 mmol/L (ref 20–32)
Calcium: 9.9 mg/dL (ref 8.6–10.3)
Chloride: 103 mmol/L (ref 98–110)
Creat: 1.29 mg/dL — ABNORMAL HIGH (ref 0.70–1.22)
Globulin: 2.1 g/dL (calc) (ref 1.9–3.7)
Glucose, Bld: 100 mg/dL — ABNORMAL HIGH (ref 65–99)
Potassium: 4.2 mmol/L (ref 3.5–5.3)
Sodium: 139 mmol/L (ref 135–146)
Total Bilirubin: 0.6 mg/dL (ref 0.2–1.2)
Total Protein: 6.4 g/dL (ref 6.1–8.1)
eGFR: 56 mL/min/{1.73_m2} — ABNORMAL LOW (ref >=60)

## 2021-07-02 LAB — IRON: Iron: 65 ug/dL (ref 50–180)

## 2021-07-02 NOTE — Progress Notes (Signed)
Subjective:    Patient ID: Devin Valdez, male    DOB: 04/03/1941, 80 y.o.   MRN: [id number]  HPI Patient is here today for recheck of his blood pressure.  He denies any chest pain shortness of breath or dyspnea on exertion.  He denies any trouble taking his cholesterol medication.  He denies any myalgias or right upper quadrant pain.  He is consistently taking his aspirin.  He has history of stroke however he is doing very well with his ambulation.  On timed, and up and go test, the patient is able to stand to his feet and 1 second without any difficulty.  He also is able to ambulate down the hallway without any staggering or ataxia.  This is much improved.  He does continue to complain of some numbness and tingling in his arms and legs however this is mild and does not seem to be causing him any disability.  He denies any severe neck pain or extremity pain. Past Medical History:  Diagnosis Date   Anemia    Anxiety    Cervical stenosis of spinal canal    mri 8/19- severe   Depression    Falls    Hallucinations    Hyperlipidemia    Hypertension    Stroke ([place])    left paracentral pons infarct   Past Surgical History:  Procedure Laterality Date   EYE SURGERY     HERNIA REPAIR     POSTERIOR CERVICAL LAMINECTOMY     with fusion C3-C7 10/19- Dr. Brigitte Pulse [facility]   Current Outpatient Medications on File Prior to Visit  Medication Sig Dispense Refill   acetaminophen (TYLENOL) 325 MG tablet Take 650 mg by mouth every 6 (six) hours as needed for mild pain or headache.     Ascorbic Acid (VITAMIN C) 1000 MG tablet Take 1,000 mg by mouth daily.     aspirin 81 MG chewable tablet Chew by mouth daily.     atorvastatin (LIPITOR) 40 MG tablet TAKE ONE TABLET (40MG  TOTAL) BY MOUTH DAILY AT 6PM 90 tablet 1   Coenzyme Q10 (CO Q-10) 200 MG CAPS Take by mouth daily.     ferrous sulfate 325 (65 FE) MG tablet Take 1 tablet (325 mg total) by mouth 2 (two) times daily with a meal. 60 tablet 1    losartan (COZAAR) 50 MG tablet TAKE ONE (1) TABLET BY MOUTH EVERY DAY FOR BLOOD PRESSURE 90 tablet 3   Multiple Vitamins-Minerals (CENTRUM SILVER ADULT 50+) TABS Take 1 capsule by mouth 2 (two) times daily. 30 tablet 2   No current facility-administered medications on file prior to visit.   No Known Allergies Social History   Socioeconomic History   Marital status: Married    Spouse name: Not on file   Number of children: Not on file   Years of education: Not on file   Highest education level: Not on file  Occupational History   Not on file  Tobacco Use   Smoking status: Former    Types: Cigarettes    Quit date: 11/21/1985    Years since quitting: 35.6   Smokeless tobacco: Never  Substance and Sexual Activity   Alcohol use: No   Drug use: No   Sexual activity: Yes  Other Topics Concern   Not on file  Social History Narrative   Not on file   Social Determinants of Health   Financial Resource Strain: Low Risk    Difficulty of Paying Living Expenses: Not  hard at all  Food Insecurity: Not on file  Transportation Needs: Not on file  Physical Activity: Not on file  Stress: Not on file  Social Connections: Not on file  Intimate Partner Violence: Not on file    Review of Systems  All other systems reviewed and are negative.     Objective:   Physical Exam Constitutional:      Appearance: Normal appearance. He is normal weight.  Cardiovascular:     Rate and Rhythm: Normal rate and regular rhythm. FrequentExtrasystoles are present.    Heart sounds: Normal heart sounds. No murmur heard.   No friction rub. No gallop.  Pulmonary:     Effort: Pulmonary effort is normal. No respiratory distress.     Breath sounds: Normal breath sounds. No stridor. No wheezing, rhonchi or rales.  Chest:     Chest wall: No tenderness.  Abdominal:     General: Abdomen is flat. Bowel sounds are normal.     Palpations: Abdomen is soft.  Musculoskeletal:     Right lower leg: No edema.      Left lower leg: No edema.  Neurological:     General: No focal deficit present.     Mental Status: He is alert and oriented to person, place, and time. Mental status is at baseline.     Cranial Nerves: Cranial nerves are intact. No cranial nerve deficit.     Sensory: Sensation is intact. No sensory deficit.     Motor: No weakness or seizure activity.     Coordination: Coordination is intact. Coordination normal.     Gait: Gait normal.     Deep Tendon Reflexes: Reflexes are normal and symmetric.          Assessment & Plan:  Pure hypercholesterolemia - Plan: CBC with Differential/Platelet, COMPLETE METABOLIC PANEL WITH GFR  Anemia, unspecified type - Plan: Iron  History of CVA (cerebrovascular accident)  Benign essential HTN Blood pressure today is outstanding.  It is history of stroke, there is no obvious neurologic deficit.  He does still report feeling somewhat unsteady on his feet however his gait and mobility seem much better today than previously.  He does have an underlying mild anemia with a baseline hemoglobin around 11.  He continues to take iron supplements.  I would like to check an iron level to see if there is an iron deficiency anemia or if this is the anemia of chronic disease.  I will check a CBC and a CMP.  Patient is not fasting so I am unable to check a lipid panel.

## 2021-07-05 NOTE — Progress Notes (Signed)
    Chronic Care Management Pharmacy Assistant   Name: Devin Valdez  MRN: [id number] DOB: Sep 30, 1941  Reason for Encounter: CCM Care Plan  Medications: Outpatient Encounter Medications as of 07/02/2021  Medication Sig   acetaminophen (TYLENOL) 325 MG tablet Take 650 mg by mouth every 6 (six) hours as needed for mild pain or headache.   Ascorbic Acid (VITAMIN C) 1000 MG tablet Take 1,000 mg by mouth daily.   aspirin 81 MG chewable tablet Chew by mouth daily.   atorvastatin (LIPITOR) 40 MG tablet TAKE ONE TABLET (40MG  TOTAL) BY MOUTH DAILY AT 6PM   Coenzyme Q10 (CO Q-10) 200 MG CAPS Take by mouth daily.   ferrous sulfate 325 (65 FE) MG tablet Take 1 tablet (325 mg total) by mouth 2 (two) times daily with a meal.   losartan (COZAAR) 50 MG tablet TAKE ONE (1) TABLET BY MOUTH EVERY DAY FOR BLOOD PRESSURE   Multiple Vitamins-Minerals (CENTRUM SILVER ADULT 50+) TABS Take 1 capsule by mouth 2 (two) times daily.   No facility-administered encounter medications on file as of 07/02/2021.   Reviewed the patients initial visit reinsured it was completed per the pharmacist Leata Mouse request. Printed the CCM care plan. Mailed the patient CCM care plan to their most recent address on file.    Follow-Up:Pharmacist Review   Charlann Lange, [place] Pharmacist Assistant [phone]

## 2021-07-20 ENCOUNTER — Telehealth: Payer: Self-pay | Admitting: *Deleted

## 2021-07-20 NOTE — Telephone Encounter (Signed)
Received call from patient wife.   Reports that patient states he noted some bright red blood in his BM this AM. Reports that blood noted was a small bit, but he did have a large hard stool.   Denies feeling weak or fatigued.   Advised that x1 episode of small amount of blood is ok as long as no other Sx present. Advised if he becomes weak, or fatigued, he needs appt to repeat lab counts.   Verbalized understanding.

## 2021-07-28 IMAGING — CT CT HEAD W/O CM
4 series · 17 of 47 positions shown, 19 images · non-contrast
Comparison: May 20, 2017

CLINICAL DATA: Left leg weakness.

EXAM:
CT HEAD WITHOUT CONTRAST
TECHNIQUE: Contiguous axial images were obtained from the base of the skull
through the vertex without intravenous contrast.

[Series 3: head wo · axial · 0.45mm/px · z∈[+1194,+1324]mm · 7 of 36 slices shown, 9 images]
[im 5/36  brain]
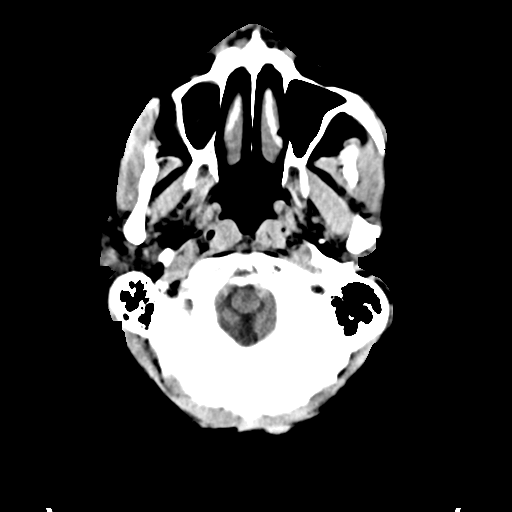
[im 5/36  bone]
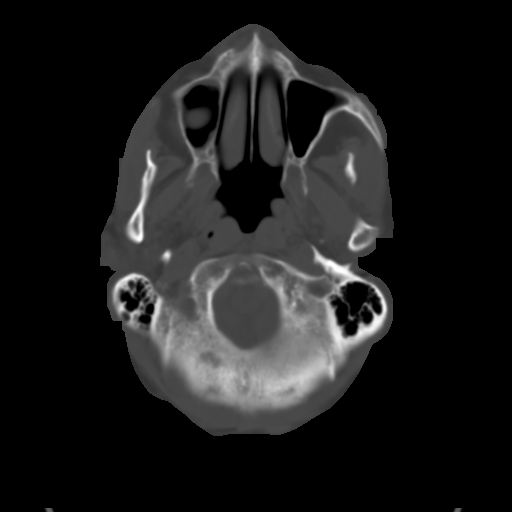
[im 9/36  brain]
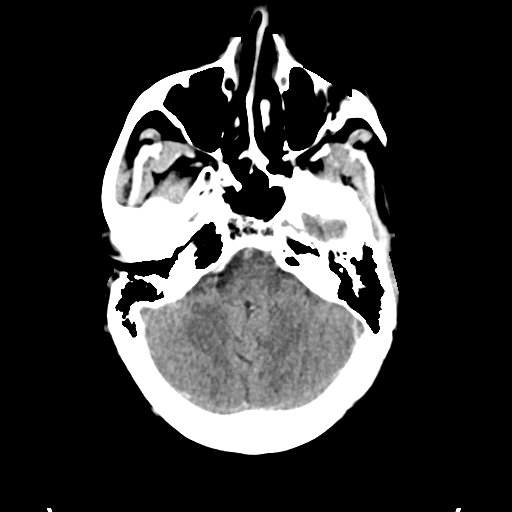
[im 14/36  brain]
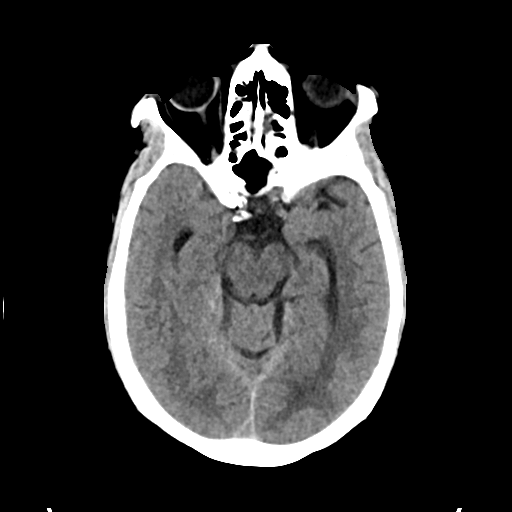
[im 18/36  brain]
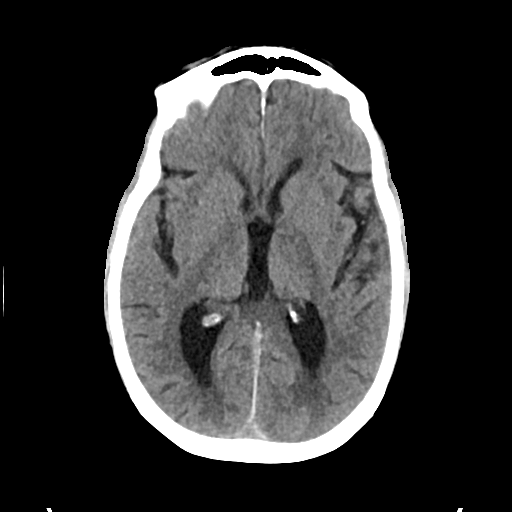
[im 22/36  brain]
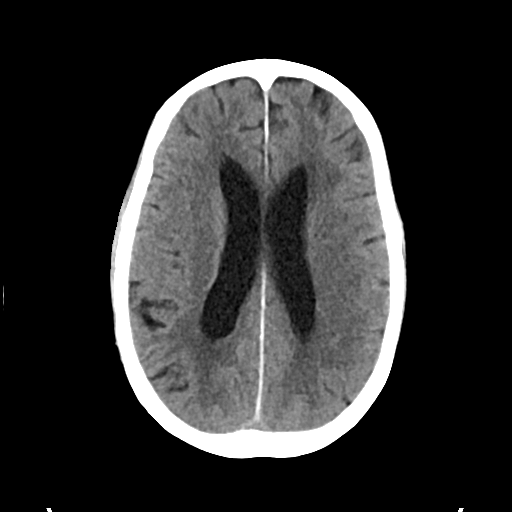
[im 22/36  bone]
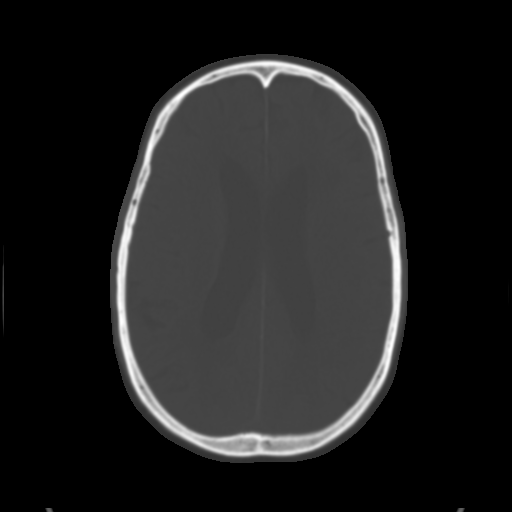
[im 27/36  brain]
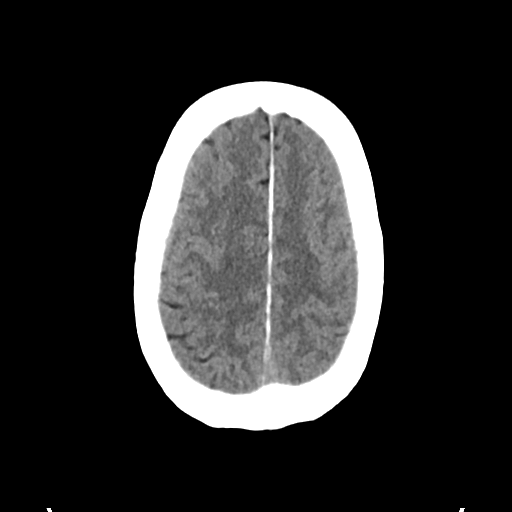
[im 31/36  brain]
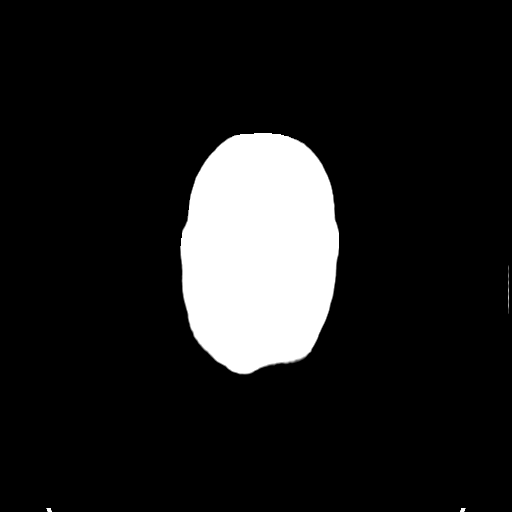

[Series 4: head bone · axial · 0.45mm/px · z∈[+1190,+1252]mm · 4 of 89 slices shown]
[im 9/89  bone]
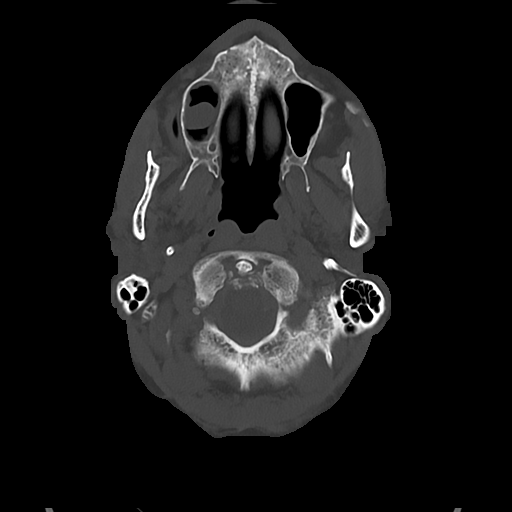
[im 18/89  bone]
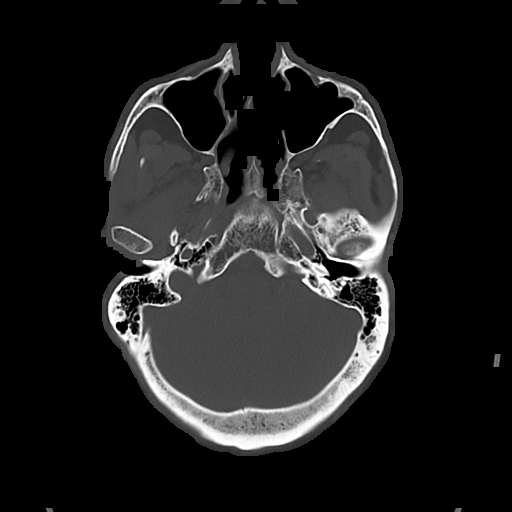
[im 27/89  bone]
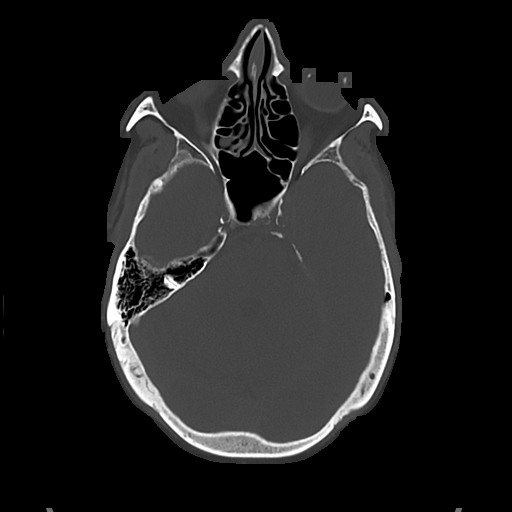
[im 40/89  bone]
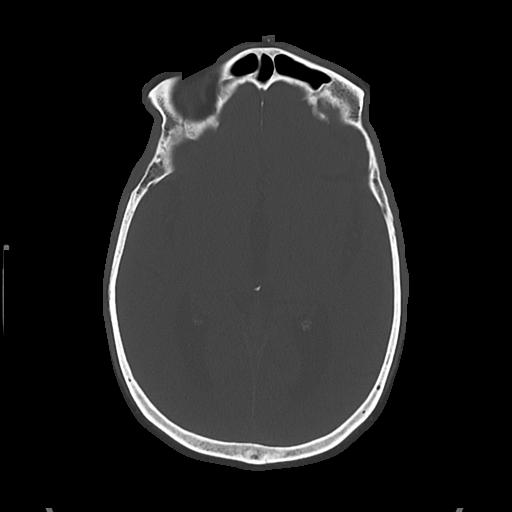

[Series 5: cor soft · coronal · 0.34mm/px · 3 of 76 slices shown]
[im 26/76  brain]
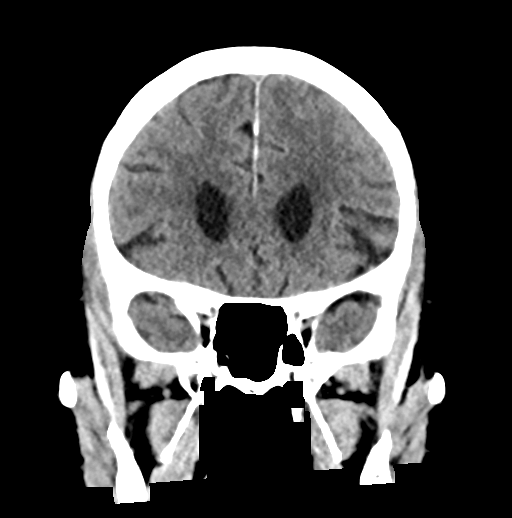
[im 34/76  brain]
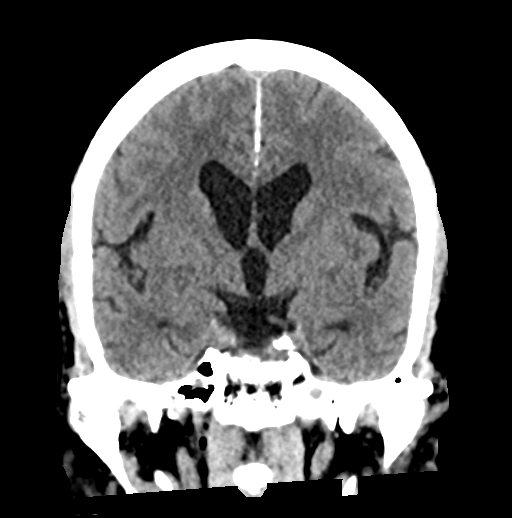
[im 42/76  brain]
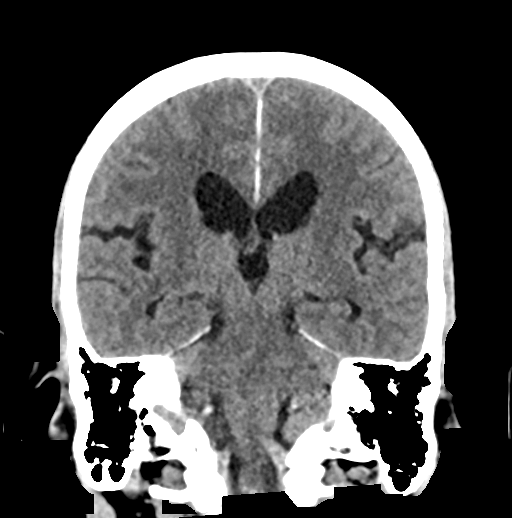

[Series 6: sag soft · sagittal · 0.34mm/px · 3 of 58 slices shown]
[im 20/58  brain]
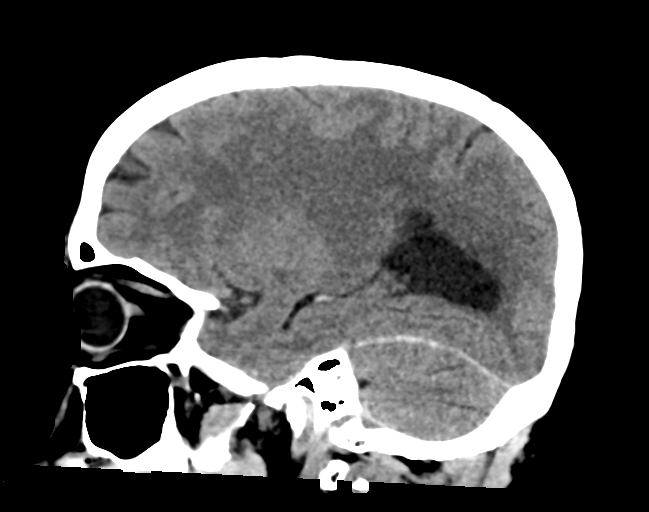
[im 29/58  brain]
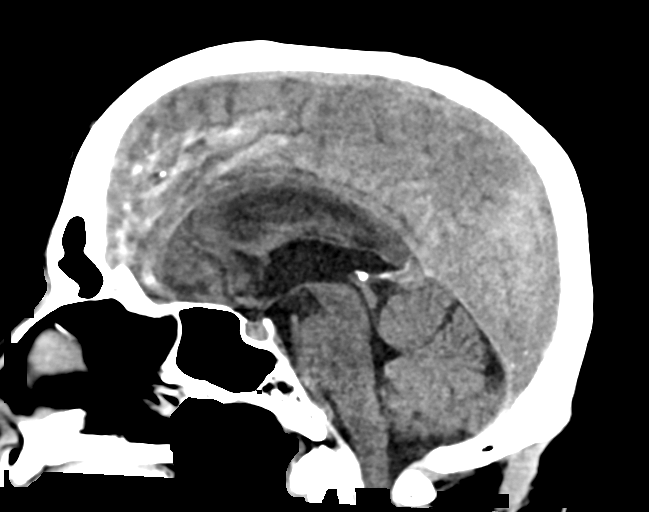
[im 39/58  brain]
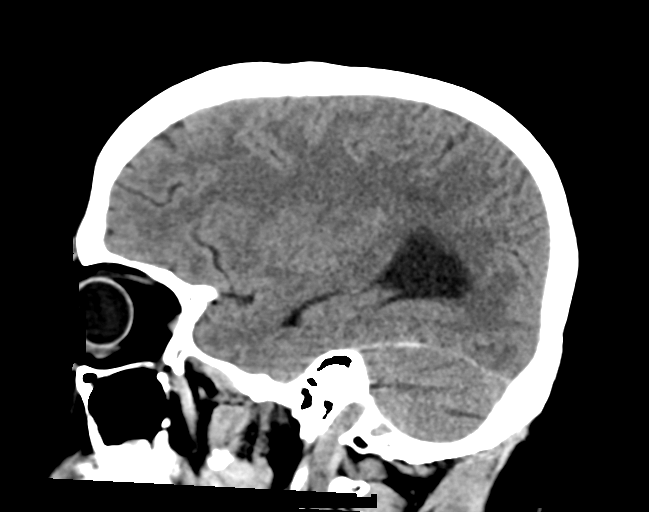

[17 of 47 positions shown; findings below may reference images not displayed]

FINDINGS: Brain: No evidence of acute infarction, hemorrhage, hydrocephalus,
extra-axial collection or mass lesion/mass effect.

Vascular: No hyperdense vessel or unexpected calcification.

Skull: Normal. Negative for fracture or focal lesion.

Sinuses/Orbits: There is a 1.6 cm x 1.1 cm right maxillary sinus
polyp versus mucous retention cyst.

Other: None.
IMPRESSION: 1. No acute intracranial pathology.
2. Right maxillary sinus polyp versus mucous retention cyst.

## 2021-07-28 IMAGING — MR MR HEAD W/O CM
12 of 13 series · 44 of 48 positions shown · non-contrast
Comparison: Head CT earlier tonight. Brain MRI 05/21/2017.

CLINICAL DATA: 78-year-old male with leg weakness.

EXAM:
MRI HEAD WITHOUT CONTRAST
TECHNIQUE: Multiplanar, multiecho pulse sequences of the brain and surrounding
structures were obtained without intravenous contrast.

[Series 5: DWI · axial · 3.0mm · 0.88mm/px · z∈[-76,+77]mm · 8 of 104 slices shown (1 of 4)]
[im 1/104]
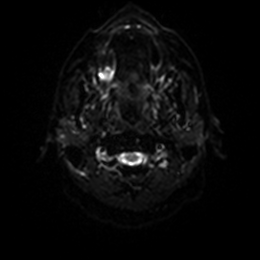
[im 15/104]
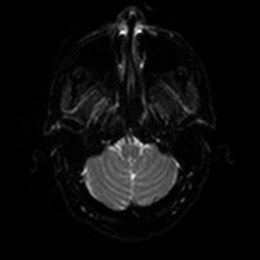
[im 30/104]
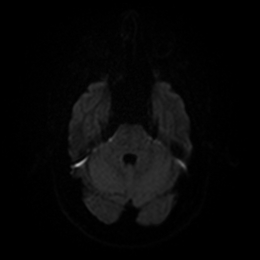
[im 45/104]
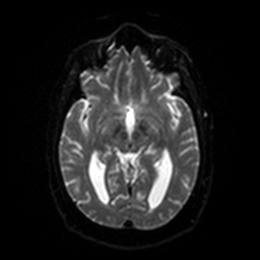
[im 59/104]
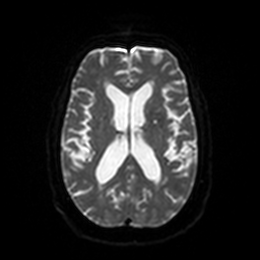
[im 74/104]
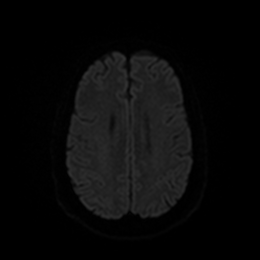
[im 89/104]
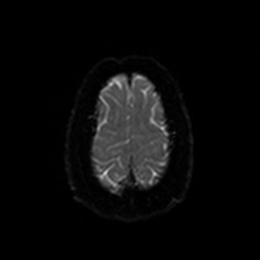
[im 104/104]
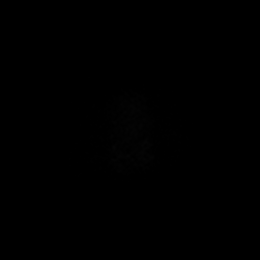

[Series 6: DWI · axial · 3.0mm · 0.88mm/px · z∈[-76,+77]mm · 4 of 52 slices shown (2 of 4)]
[im 1/52]
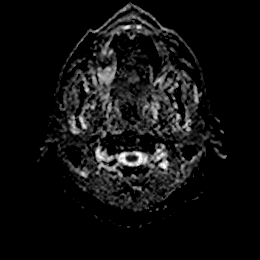
[im 18/52]
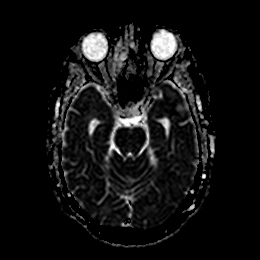
[im 35/52]
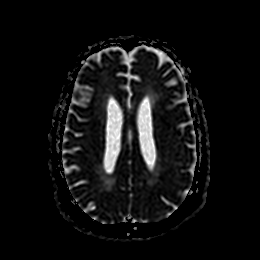
[im 52/52]
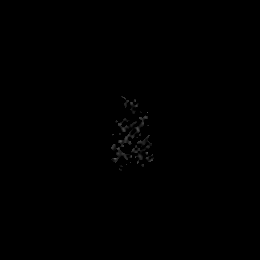

[Series 7: DWI · coronal · 4.0mm · 0.88mm/px · 5 of 72 slices shown (3 of 4)]
[im 1/72]
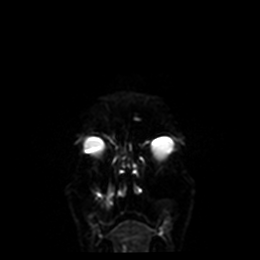
[im 18/72]
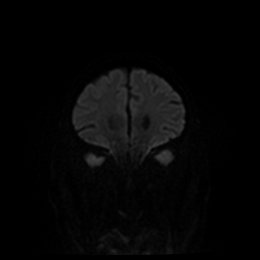
[im 36/72]
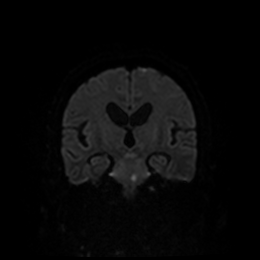
[im 54/72]
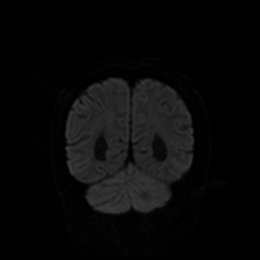
[im 72/72]
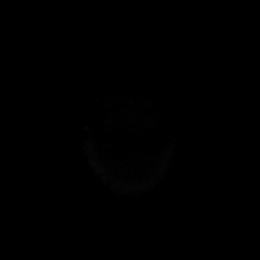

[Series 8: DWI · coronal · 4.0mm · 0.88mm/px · 3 of 36 slices shown (4 of 4)]
[im 1/36]
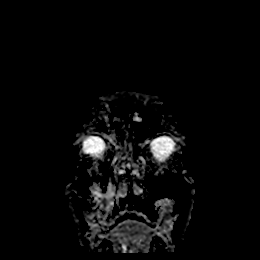
[im 18/36]
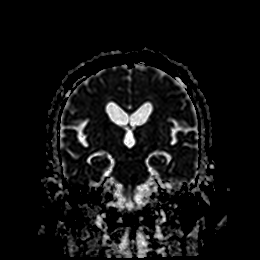
[im 36/36]
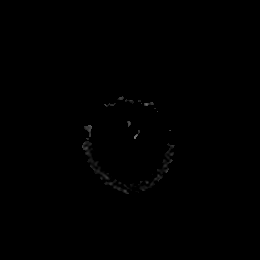

[Series 9: FLAIR · axial · 5.0mm · 0.45mm/px · z∈[-74,+70]mm · 2 of 25 slices shown]
[im 1/25]
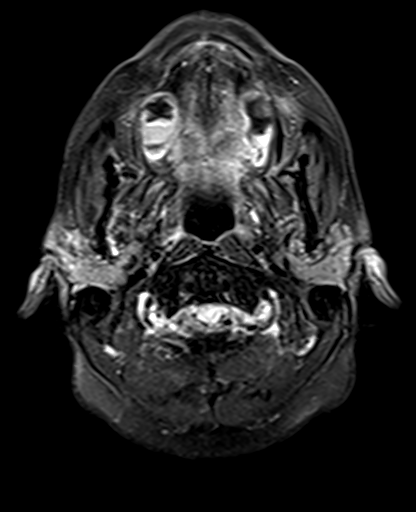
[im 25/25]
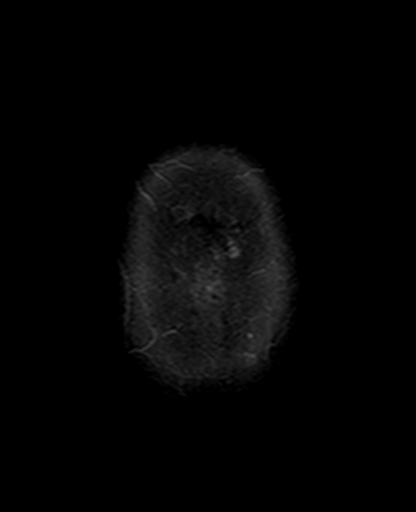

[Series 10: mag_images · axial · 3.0mm · 0.90mm/px · z∈[-90,+87]mm · 4 of 60 slices shown]
[im 1/60]
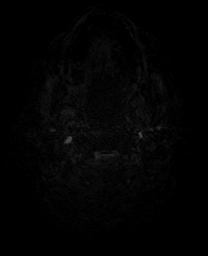
[im 20/60]
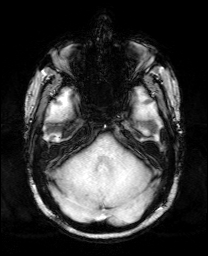
[im 40/60]
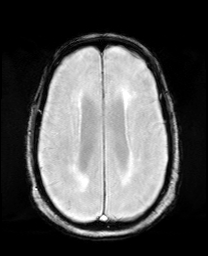
[im 60/60]
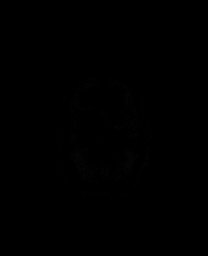

[Series 11: pha_images · axial · 3.0mm · 0.90mm/px · z∈[-90,+87]mm · 4 of 60 slices shown]
[im 1/60]
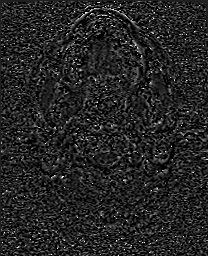
[im 20/60]
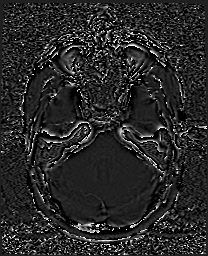
[im 40/60]
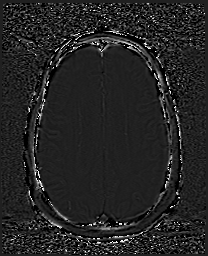
[im 60/60]
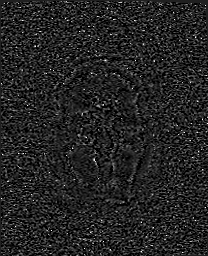

[Series 12: swi_images · axial · 3.0mm · 0.90mm/px · z∈[-90,+87]mm · 4 of 60 slices shown]
[im 1/60]
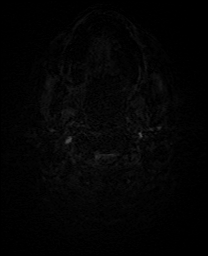
[im 20/60]
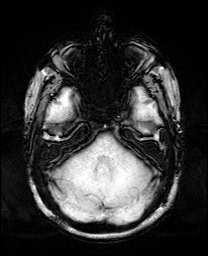
[im 40/60]
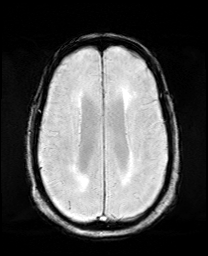
[im 60/60]
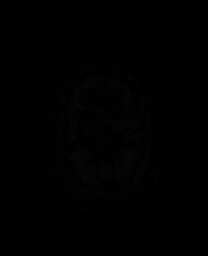

[Series 13: mip_images(sw) · axial · 24.0mm · 0.90mm/px · z∈[-80,+76]mm · 4 of 53 slices shown]
[im 1/53]
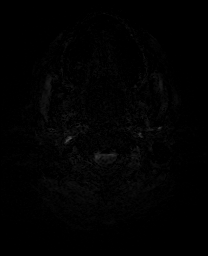
[im 18/53]
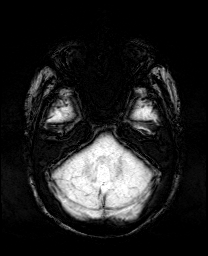
[im 35/53]
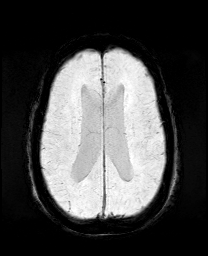
[im 53/53]
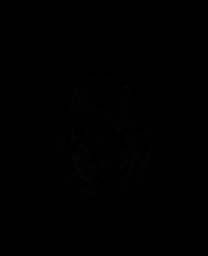

[Series 14: T1 · sagittal · 5.0mm · 0.75mm/px · 2 of 25 slices shown]
[im 1/25]
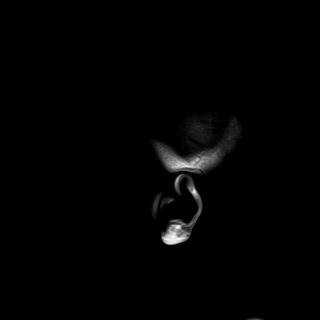
[im 25/25]
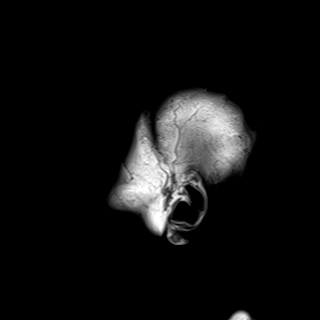

[Series 15: T2 · axial · 5.0mm · 0.72mm/px · z∈[-73,+71]mm · 2 of 25 slices shown (1 of 2)]
[im 1/25]
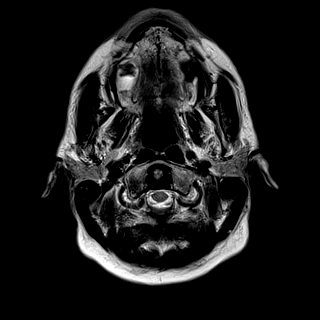
[im 25/25]
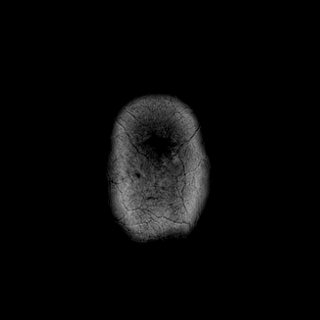

[Series 17: T2 · coronal · 5.0mm · 0.34mm/px · 2 of 29 slices shown (2 of 2)]
[im 1/29]
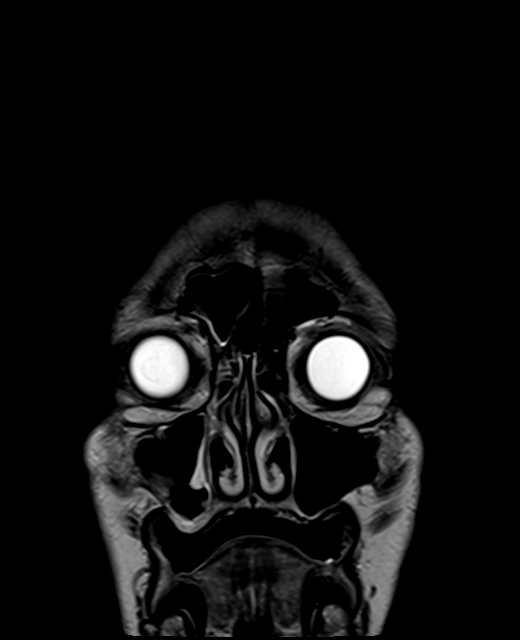
[im 29/29]
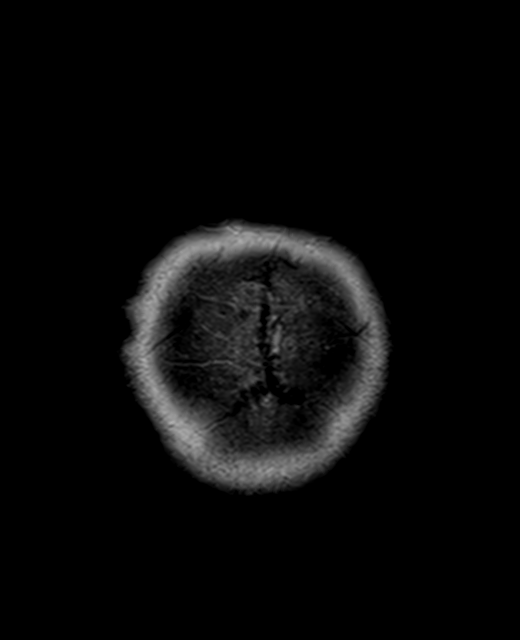

[44 of 48 positions shown; findings below may reference images not displayed]

FINDINGS: Brain: Patchy and linear restricted diffusion in the left
paracentral pons (series 5, image 65). Faint associated T2 and FLAIR
hyperintensity. No mass effect or evidence of hemorrhagic
transformation.

No other restricted diffusion identified. Nomidline shift, mass
effect, evidence of mass lesion, ventriculomegaly, extra-axial
collection or acute intracranial hemorrhage. Cervicomedullary
junction and pituitary are within normal limits.

Patchy bilateral cerebral white matter T2 and FLAIR hyperintensity
appears stable since 7802. No cortical encephalomalacia or chronic
cerebral blood products identified. Deep gray nuclei and cerebellum
remain within normal limits.

Vascular: Major intracranial vascular flow voids are stable since
7802.

Skull and upper cervical spine: Normal for age visible cervical
spine. Normal bone marrow signal.

Sinuses/Orbits: Stable, negative orbits. Mild right ethmoid sinus
mucosal thickening today. Small chronic right maxillary sinus
retention cysts.

Other: Mastoids remain clear. Visible internal auditory structures
appear normal. Scalp and face soft tissues appear negative.
IMPRESSION: 1. Acute brainstem infarct in the left paracentral pons (pontine
perforator artery territory). No mass effect or hemorrhage.
2. Otherwise stable noncontrast MRI appearance of the brain since
7802. Mild to moderate for age white matter signal changes most
commonly due to chronic small vessel disease.

## 2021-08-10 ENCOUNTER — Other Ambulatory Visit: Payer: Self-pay

## 2021-08-10 ENCOUNTER — Encounter (INDEPENDENT_AMBULATORY_CARE_PROVIDER_SITE_OTHER): Payer: Self-pay | Admitting: Ophthalmology

## 2021-08-10 ENCOUNTER — Encounter (INDEPENDENT_AMBULATORY_CARE_PROVIDER_SITE_OTHER): Payer: Medicare Other | Admitting: Ophthalmology

## 2021-08-10 ENCOUNTER — Ambulatory Visit (INDEPENDENT_AMBULATORY_CARE_PROVIDER_SITE_OTHER): Payer: Medicare Other | Admitting: Ophthalmology

## 2021-08-10 DIAGNOSIS — H353112 Nonexudative age-related macular degeneration, right eye, intermediate dry stage: Secondary | ICD-10-CM | POA: Diagnosis not present

## 2021-08-10 DIAGNOSIS — H34812 Central retinal vein occlusion, left eye, with macular edema: Secondary | ICD-10-CM | POA: Diagnosis not present

## 2021-08-10 MED ORDER — BEVACIZUMAB 2.5 MG/0.1ML IZ SOSY
2.5000 mg | PREFILLED_SYRINGE | INTRAVITREAL | Status: AC | PRN
  Administered 2021-08-10: 2.5 mg via INTRAVITREAL

## 2021-08-10 NOTE — Assessment & Plan Note (Signed)
Stable by OCT OD

## 2021-08-10 NOTE — Progress Notes (Signed)
08/10/2021     CHIEF COMPLAINT Patient presents for  Chief Complaint  Patient presents with   Retina Follow Up    10 week fu OS and Avastin OS Pt states VA OU stable since last visit. Pt denies FOL, floaters, or ocular pain OU.        HISTORY OF PRESENT ILLNESS: Devin Valdez is a 80 y.o. male who presents to the clinic today for:   HPI     Retina Follow Up   Patient presents with  Other.  In left eye.  This started 11 weeks ago.  Severity is mild.  Duration of 11 weeks.  Since onset it is stable. Additional comments: 10 week fu OS and Avastin OS Pt states VA OU stable since last visit. Pt denies FOL, floaters, or ocular pain OU.          Comments   11 week fu os oct fp, avastin os. Patient states vision is stable and unchanged since last visit. Denies any new floaters or FOL.       Last edited by Laurin Coder on 08/10/2021  8:37 AM.      Referring physician: [facility] [address] [place],  [place] [address]  HISTORICAL INFORMATION:   Selected notes from the MEDICAL RECORD NUMBER    Lab Results  Component Value Date   HGBA1C 4.6 (L) 01/14/2020     CURRENT MEDICATIONS: No current outpatient medications on file. (Ophthalmic Drugs)   No current facility-administered medications for this visit. (Ophthalmic Drugs)   Current Outpatient Medications (Other)  Medication Sig   acetaminophen (TYLENOL) 325 MG tablet Take 650 mg by mouth every 6 (six) hours as needed for mild pain or headache.   Ascorbic Acid (VITAMIN C) 1000 MG tablet Take 1,000 mg by mouth daily.   aspirin 81 MG chewable tablet Chew by mouth daily.   atorvastatin (LIPITOR) 40 MG tablet TAKE ONE TABLET (40MG  TOTAL) BY MOUTH DAILY AT 6PM   Coenzyme Q10 (CO Q-10) 200 MG CAPS Take by mouth daily.   ferrous sulfate 325 (65 FE) MG tablet Take 1 tablet (325 mg total) by mouth 2 (two) times daily with a meal.   losartan (COZAAR) 50 MG tablet TAKE ONE (1) TABLET BY  MOUTH EVERY DAY FOR BLOOD PRESSURE   Multiple Vitamins-Minerals (CENTRUM SILVER ADULT 50+) TABS Take 1 capsule by mouth 2 (two) times daily.   No current facility-administered medications for this visit. (Other)      REVIEW OF SYSTEMS:    ALLERGIES No Known Allergies  PAST MEDICAL HISTORY Past Medical History:  Diagnosis Date   Anemia    Anxiety    Cervical stenosis of spinal canal    mri 8/19- severe   Depression    Falls    Hallucinations    Hyperlipidemia    Hypertension    Stroke ([place])    left paracentral pons infarct   Past Surgical History:  Procedure Laterality Date   EYE SURGERY     HERNIA REPAIR     POSTERIOR CERVICAL LAMINECTOMY     with fusion C3-C7 10/19- Dr. Brigitte Pulse at [facility] Family History  Problem Relation Age of Onset   Multiple sclerosis Son    Bipolar disorder Son     SOCIAL HISTORY Social History   Tobacco Use   Smoking status: Former    Types: Cigarettes    Quit date: 11/21/1985    Years since  quitting: 35.7   Smokeless tobacco: Never  Substance Use Topics   Alcohol use: No   Drug use: No         OPHTHALMIC EXAM:  Base Eye Exam     Visual Acuity (ETDRS)       Right Left   Dist cc 20/40 -2 20/70 -1   Dist ph cc 20/30 -2 20/60 -2         Tonometry (Tonopen, 8:34 AM)       Right Left   Pressure 19 19         Pupils       Pupils Dark Light APD   Right PERRL 5 4 None   Left PERRL 4 3 None         Extraocular Movement       Right Left    Full Full         Neuro/Psych     Oriented x3: Yes   Mood/Affect: Normal         Dilation     Left eye: 1.0% Mydriacyl, 2.5% Phenylephrine @ 8:34 AM           Slit Lamp and Fundus Exam     External Exam       Right Left   External Normal Normal         Slit Lamp Exam       Right Left   Lids/Lashes Normal Normal   Conjunctiva/Sclera White and quiet White and quiet   Cornea Clear Clear   Anterior Chamber Deep and quiet Deep  and quiet   Iris Round and reactive Round and reactive   Lens Posterior chamber intraocular lens Posterior chamber intraocular lens   Anterior Vitreous Normal Normal         Fundus Exam       Right Left   Posterior Vitreous  ,, Central vitreous floaters   Disc  collaterals   C/D Ratio  0.4   Macula  Macular thickening less, Microaneurysms, less Cystoid macular edema,  Intraretinal hemorrhage less, large subfoveal druse   Vessels  Hemiretinal vein occlusion superiorly.   Periphery  Normal            IMAGING AND PROCEDURES  Imaging and Procedures for 08/10/21  OCT, Retina - OU - Both Eyes       Right Eye Quality was good. Scan locations included subfoveal. Central Foveal Thickness: 285. Progression has been stable. Findings include normal foveal contour.   Left Eye Quality was good. Scan locations included subfoveal. Central Foveal Thickness: 306. Progression has been stable. Findings include cystoid macular edema, vitreomacular adhesion , vitreous traction.   Notes Outer retinal photoreceptor EZ layer disruption center fovea accounts for the acuity, OS.  At 11 weeks today, CME and temporal to the [place] from Hemi central vein occlusion has increased.  We will repeat injection today and examination OS next in 7 to 8 weeks     Color Fundus Photography Optos - OU - Both Eyes       Right Eye Progression has been stable. Disc findings include normal observations. Macula : normal observations. Vessels : normal observations. Periphery : normal observations.   Left Eye Progression has been stable. Disc findings include normal observations. Macula : microaneurysms.   Notes Old Hemi central retinal vein occlusion left eye with CME     Intravitreal Injection, Pharmacologic Agent - OS - Left Eye       Time Out 08/10/2021. 9:04 AM. Confirmed correct  patient, procedure, site, and patient consented.   Anesthesia Topical anesthesia was used. Anesthetic medications included  [place] 3.5%.   Procedure Preparation included Tobramycin 0.3%, 10% betadine to eyelids, 5% betadine to ocular surface. A 30 gauge needle was used.   Injection: 2.5 mg bevacizumab 2.5 MG/0.1ML   Route: Intravitreal, Site: Left Eye   NDC: [phone], Lot: [id number]   Post-op Post injection exam found visual acuity of at least counting fingers. The patient tolerated the procedure well. There were no complications. The patient received written and verbal post procedure care education. Post injection medications included ocuflox.              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  We will repeat injection today Avastin to recover and follow-up again in 7 to 8 weeks.  At that time we will perform fluorescein angiography to study the macular perfusion and peripheral perfusion and potentially treat in the future retinal capillary nonperfusion regions  Intermediate stage nonexudative age-related macular degeneration of right eye Stable by OCT OD      ICD-10-CM   1. 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 Intermediate stage nonexudative age-related macular degeneration of right eye  H35.3112       1.  2.  3.  Ophthalmic Meds Ordered this visit:  Meds ordered this encounter  Medications   bevacizumab (AVASTIN) SOSY 2.5 mg       Return in about 7 weeks (around 09/28/2021) for DILATE OU, OPTOS FFA L/R, COLOR FP, AVASTIN OCT, OS.  There are no Patient Instructions on file for this visit.   Explained the diagnoses, plan, and follow up with the patient and they expressed understanding.  Patient expressed  understanding of the importance of proper follow up care.   Clent Demark David Towson M.D. Diseases & Surgery of the Retina and Vitreous Retina & Diabetic [place] 08/10/21     Abbreviations: M myopia (nearsighted); A astigmatism; H hyperopia (farsighted); P presbyopia; Mrx spectacle prescription;  CTL contact lenses; OD right eye; OS left eye; OU both eyes  XT exotropia; ET esotropia; PEK punctate epithelial keratitis; PEE punctate epithelial erosions; DES dry eye syndrome; MGD meibomian gland dysfunction; ATs artificial tears; PFAT's preservative free artificial tears; [place] nuclear sclerotic cataract; PSC posterior subcapsular cataract; ERM epi-retinal membrane; PVD posterior vitreous detachment; RD retinal detachment; DM diabetes mellitus; DR diabetic retinopathy; NPDR non-proliferative diabetic retinopathy; PDR proliferative diabetic retinopathy; CSME clinically significant macular edema; DME diabetic macular edema; dbh dot blot hemorrhages; CWS cotton wool spot; POAG primary open angle glaucoma; C/D cup-to-disc ratio; HVF humphrey visual field; GVF goldmann visual field; OCT optical coherence tomography; IOP intraocular pressure; BRVO Branch retinal vein occlusion; CRVO central retinal vein occlusion; CRAO central retinal artery occlusion; BRAO branch retinal artery occlusion; RT retinal tear; SB scleral buckle; PPV pars plana vitrectomy; VH Vitreous hemorrhage; PRP panretinal laser photocoagulation; IVK intravitreal kenalog; VMT vitreomacular traction; MH Macular hole;  NVD neovascularization of the disc; NVE neovascularization elsewhere; AREDS age related eye disease study; ARMD age related macular degeneration; POAG primary open angle glaucoma; EBMD epithelial/anterior basement membrane dystrophy;  ACIOL anterior chamber intraocular lens; IOL intraocular lens; PCIOL posterior chamber intraocular lens; Phaco/IOL phacoemulsification with intraocular lens placement; [place] photorefractive keratectomy; LASIK laser  assisted in situ keratomileusis; HTN hypertension; DM diabetes mellitus; COPD chronic obstructive pulmonary disease

## 2021-08-10 NOTE — Assessment & Plan Note (Signed)
OS today at 11-week follow-up attempt at extension of interval of therapy, CME has recurred and is worsened.  We will repeat injection today Avastin to recover and follow-up again in 7 to 8 weeks.  At that time we will perform fluorescein angiography to study the macular perfusion and peripheral perfusion and potentially treat in the future retinal capillary nonperfusion regions

## 2021-09-01 ENCOUNTER — Ambulatory Visit: Payer: Medicare Other

## 2021-09-06 ENCOUNTER — Other Ambulatory Visit: Payer: Self-pay | Admitting: Family Medicine

## 2021-09-28 ENCOUNTER — Encounter (INDEPENDENT_AMBULATORY_CARE_PROVIDER_SITE_OTHER): Payer: Self-pay | Admitting: Ophthalmology

## 2021-09-28 ENCOUNTER — Encounter (INDEPENDENT_AMBULATORY_CARE_PROVIDER_SITE_OTHER): Payer: Medicare Other | Admitting: Ophthalmology

## 2021-09-28 ENCOUNTER — Other Ambulatory Visit: Payer: Self-pay

## 2021-09-28 ENCOUNTER — Ambulatory Visit (INDEPENDENT_AMBULATORY_CARE_PROVIDER_SITE_OTHER): Payer: Medicare Other | Admitting: Ophthalmology

## 2021-09-28 DIAGNOSIS — H34812 Central retinal vein occlusion, left eye, with macular edema: Secondary | ICD-10-CM

## 2021-09-28 DIAGNOSIS — H353112 Nonexudative age-related macular degeneration, right eye, intermediate dry stage: Secondary | ICD-10-CM

## 2021-09-28 DIAGNOSIS — H353122 Nonexudative age-related macular degeneration, left eye, intermediate dry stage: Secondary | ICD-10-CM

## 2021-09-28 DIAGNOSIS — H43822 Vitreomacular adhesion, left eye: Secondary | ICD-10-CM | POA: Diagnosis not present

## 2021-09-28 MED ORDER — BEVACIZUMAB 2.5 MG/0.1ML IZ SOSY
2.5000 mg | PREFILLED_SYRINGE | INTRAVITREAL | Status: AC | PRN
Start: 2021-09-28 — End: 2021-09-28
  Administered 2021-09-28: 2.5 mg via INTRAVITREAL

## 2021-09-28 NOTE — Assessment & Plan Note (Signed)
No signs of complication 

## 2021-09-28 NOTE — Progress Notes (Signed)
09/28/2021     CHIEF COMPLAINT Patient presents for  Chief Complaint  Patient presents with   Retina Follow Up      HISTORY OF PRESENT ILLNESS: Devin Valdez is a 80 y.o. male who presents to the clinic today for:   HPI     Retina Follow Up   Patient presents with  CRVO/BRVO.  In left eye.  This started 7 weeks ago.  Severity is mild.  Duration of 7 weeks.  Since onset it is stable.        Comments   7 week fu ou and oct fp/ffa l/r avastin OS Pt states VA OU stable since last visit. Pt denies FOL, floaters, or ocular pain OU.  Pt states, "I cannot tell that anything has changed at all. I still have the same old floaters and my vision is not any worse or any better."       Last edited by Devin Valdez, COA on 09/28/2021 10:50 AM.      Referring physician: [name], MD [address] [place],  [place] [address]  HISTORICAL INFORMATION:   Selected notes from the MEDICAL RECORD NUMBER    Lab Results  Component Value Date   HGBA1C 4.6 (L) 01/14/2020     CURRENT MEDICATIONS: No current outpatient medications on file. (Ophthalmic Drugs)   No current facility-administered medications for this visit. (Ophthalmic Drugs)   Current Outpatient Medications (Other)  Medication Sig   acetaminophen (TYLENOL) 325 MG tablet Take 650 mg by mouth every 6 (six) hours as needed for mild pain or headache.   Ascorbic Acid (VITAMIN C) 1000 MG tablet Take 1,000 mg by mouth daily.   aspirin 81 MG chewable tablet Chew by mouth daily.   atorvastatin (LIPITOR) 40 MG tablet TAKE ONE TABLET (40MG  TOTAL) BY MOUTH DAILY AT 6PM   Coenzyme Q10 (CO Q-10) 200 MG CAPS Take by mouth daily.   ferrous sulfate 325 (65 FE) MG tablet Take 1 tablet (325 mg total) by mouth 2 (two) times daily with a meal.   losartan (COZAAR) 50 MG tablet TAKE ONE (1) TABLET BY MOUTH EVERY DAY FOR BLOOD PRESSURE   Multiple Vitamins-Minerals (CENTRUM SILVER ADULT 50+) TABS Take 1 capsule by mouth 2  (two) times daily.   No current facility-administered medications for this visit. 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 Brigitte Pulse at [place] Family History  Problem Relation Age of Onset   Multiple sclerosis Son    Bipolar disorder Son     SOCIAL HISTORY Social History   Tobacco Use   Smoking status: Former    Types: Cigarettes    Quit date: 11/21/1985    Years since quitting: 35.8   Smokeless tobacco: Never  Substance Use Topics   Alcohol use: No   Drug use: No  OPHTHALMIC EXAM:  Base Eye Exam     Visual Acuity (ETDRS)       Right Left   Dist cc 20/50 -2 20/70 -1   Dist ph cc 20/30 -2 NI         Tonometry (Tonopen, 10:56 AM)       Right Left   Pressure 14 17         Pupils       Pupils Dark Light Shape React APD   Right PERRL 3 3 Round Minimal None   Left PERRL 3 2 Round Brisk None         Visual Fields (Counting fingers)       Left Right    Full Full         Extraocular Movement       Right Left    Full Full         Neuro/Psych     Oriented x3: Yes   Mood/Affect: Normal         Dilation     Both eyes: 1.0% Mydriacyl, 2.5% Phenylephrine @ 10:56 AM           Slit Lamp and Fundus Exam     External Exam       Right Left   External Normal Normal         Slit Lamp Exam       Right Left   Lids/Lashes Normal Normal   Conjunctiva/Sclera White and quiet White and quiet   Cornea Clear Clear   Anterior Chamber Deep and quiet Deep and quiet    Iris Round and reactive Round and reactive   Lens Posterior chamber intraocular lens Posterior chamber intraocular lens   Anterior Vitreous Normal Normal         Fundus Exam       Right Left   Posterior Vitreous Normal ,, Central vitreous floaters   Disc Normal, with irregular vessels on the nerve not clearly collaterals collaterals   C/D Ratio 0.35 0.4   Macula Normal Macular thickening less, Microaneurysms, less Cystoid macular edema,  Intraretinal hemorrhage less, large subfoveal druse   Vessels Normal Hemiretinal vein occlusion superiorly.   Periphery Normal Normal            IMAGING AND PROCEDURES  Imaging and Procedures for 09/28/21  OCT, Retina - OU - Both Eyes       Right Eye Quality was good. Scan locations included subfoveal. Central Foveal Thickness: 289. Progression has been stable. Findings include normal foveal contour.   Left Eye Quality was good. Scan locations included subfoveal. Central Foveal Thickness: 312. Progression has been stable. Findings include cystoid macular edema, vitreomacular adhesion , vitreous traction.   Notes Outer retinal photoreceptor EZ layer disruption center fovea accounts for the acuity, OS.  At 7 weeks today, CME and temporal to the [place] from Hemi central vein occlusion has increased.  We will repeat injection today and examination OS next in 6 weeks     Intravitreal Injection, Pharmacologic Agent - OS - Left Eye       Time Out 09/28/2021. 11:34 AM. Confirmed correct patient, procedure, site, and patient consented.   Anesthesia Topical anesthesia was used. Anesthetic medications included Lidocaine 4%.   Procedure Preparation included Tobramycin 0.3%, 10% betadine to eyelids, 5% betadine to ocular surface. A 30 gauge needle was used.   Injection: 2.5 mg bevacizumab 2.5 MG/0.1ML   Route: Intravitreal, Site: Left Eye   NDC: [phone], Lot: [id number]  Post-op Post injection exam found visual acuity of at least  counting fingers. The patient tolerated the procedure well. There were no complications. The patient received written and verbal post procedure care education. Post injection medications included ocuflox.              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  Still active and increasing at 7-week interval.  Repeat injection Avastin today maintain 6-week interval in the future  Intermediate stage nonexudative age-related macular degeneration of right eye No signs of complication  Intermediate stage nonexudative age-related macular degeneration of left eye No signs of complications  Vitreomacular adhesion of left eye Minor no foveal changes appears stable      ICD-10-CM   1. 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 Intermediate stage nonexudative age-related macular degeneration of right eye  H35.3112 OCT, Retina - OU - Both Eyes    CANCELED: Color Fundus Photography Optos - OU - Both Eyes    3. Intermediate stage nonexudative age-related macular degeneration of left eye  H35.3122     4. Vitreomacular adhesion of left eye  H43.822       1.  OS chronic active CME in the temporal aspect of the fovea threatening Worsening of central vision with outer photoreceptor changes.  Repeat injection OS today  2.  Follow-up next OU in 6 weeks dilate OU likely FFA OS, OD  3.  Ophthalmic Meds Ordered this visit:  Meds ordered this encounter  Medications   bevacizumab (AVASTIN) SOSY 2.5 mg       Return in about 6 weeks  (around 11/09/2021) for DILATE OU, COLOR FP, OPTOS FFA L/R, AVASTIN OCT, OS.  There are no Patient Instructions on file for this visit.   Explained the diagnoses, plan, and follow up with the patient and they expressed understanding.  Patient expressed understanding of the importance of proper follow up care.   Clent Demark Mccayla Shimada M.D. Diseases & Surgery of the Retina and Vitreous Retina & Diabetic [place] 09/28/21     Abbreviations: M myopia (nearsighted); A astigmatism; H hyperopia (farsighted); P presbyopia; Mrx spectacle prescription;  CTL contact lenses; OD right eye; OS left eye; OU both eyes  XT exotropia; ET esotropia; PEK punctate epithelial keratitis; PEE punctate epithelial erosions; DES dry eye syndrome; MGD meibomian gland dysfunction; ATs artificial tears; PFAT's preservative free artificial tears; [place] nuclear sclerotic cataract; PSC posterior subcapsular cataract; ERM epi-retinal membrane; PVD posterior vitreous detachment; RD retinal detachment; DM diabetes mellitus; DR diabetic retinopathy; NPDR non-proliferative diabetic retinopathy; PDR proliferative diabetic retinopathy; CSME clinically significant macular edema; DME diabetic macular edema; dbh dot blot hemorrhages; CWS cotton wool spot; POAG primary open angle glaucoma; C/D cup-to-disc ratio; HVF humphrey visual field; GVF goldmann visual field; OCT optical coherence tomography; IOP intraocular pressure; BRVO Branch retinal vein occlusion; CRVO central retinal vein occlusion; CRAO central retinal artery occlusion; BRAO branch retinal artery occlusion; RT retinal tear; SB scleral buckle; PPV pars plana vitrectomy; VH Vitreous hemorrhage; PRP panretinal laser photocoagulation;  IVK intravitreal kenalog; VMT vitreomacular traction; MH Macular hole;  NVD neovascularization of the disc; NVE neovascularization elsewhere; AREDS age related eye disease study; ARMD age related macular degeneration; POAG primary open angle glaucoma; EBMD  epithelial/anterior basement membrane dystrophy; ACIOL anterior chamber intraocular lens; IOL intraocular lens; PCIOL posterior chamber intraocular lens; Phaco/IOL phacoemulsification with intraocular lens placement; [place] photorefractive keratectomy; LASIK laser assisted in situ keratomileusis; HTN hypertension; DM diabetes mellitus; COPD chronic obstructive pulmonary disease

## 2021-09-28 NOTE — Assessment & Plan Note (Signed)
Chronic active CME and perifoveal in center threatening CME concentrated on the temporal aspect of the macula left eye, from [place].  Still active and increasing at 7-week interval.  Repeat injection Avastin today maintain 6-week interval in the future

## 2021-09-28 NOTE — Assessment & Plan Note (Signed)
Minor no foveal changes appears stable

## 2021-09-28 NOTE — Assessment & Plan Note (Signed)
No signs of complications

## 2021-11-02 ENCOUNTER — Other Ambulatory Visit: Payer: Self-pay | Admitting: Family Medicine

## 2021-11-09 ENCOUNTER — Encounter (INDEPENDENT_AMBULATORY_CARE_PROVIDER_SITE_OTHER): Payer: Medicare Other | Admitting: Ophthalmology

## 2021-11-17 ENCOUNTER — Ambulatory Visit (INDEPENDENT_AMBULATORY_CARE_PROVIDER_SITE_OTHER): Payer: Medicare Other | Admitting: Ophthalmology

## 2021-11-17 ENCOUNTER — Other Ambulatory Visit: Payer: Self-pay

## 2021-11-17 ENCOUNTER — Encounter (INDEPENDENT_AMBULATORY_CARE_PROVIDER_SITE_OTHER): Payer: Self-pay | Admitting: Ophthalmology

## 2021-11-17 DIAGNOSIS — H34812 Central retinal vein occlusion, left eye, with macular edema: Secondary | ICD-10-CM

## 2021-11-17 DIAGNOSIS — H43822 Vitreomacular adhesion, left eye: Secondary | ICD-10-CM | POA: Diagnosis not present

## 2021-11-17 DIAGNOSIS — H353112 Nonexudative age-related macular degeneration, right eye, intermediate dry stage: Secondary | ICD-10-CM | POA: Diagnosis not present

## 2021-11-17 MED ORDER — FLUORESCEIN SODIUM 10 % IV SOLN
500.0000 mg | INTRAVENOUS | Status: AC | PRN
Start: 2021-11-17 — End: 2021-11-17
  Administered 2021-11-17: 12:00:00 500 mg via INTRAVENOUS

## 2021-11-17 MED ORDER — BEVACIZUMAB 2.5 MG/0.1ML IZ SOSY
2.5000 mg | PREFILLED_SYRINGE | INTRAVITREAL | Status: AC | PRN
Start: 2021-11-17 — End: 2021-11-17
  Administered 2021-11-17: 12:00:00 2.5 mg via INTRAVITREAL

## 2021-11-17 NOTE — Progress Notes (Signed)
11/17/2021     CHIEF COMPLAINT Patient presents for  Chief Complaint  Patient presents with   Retina Follow Up      HISTORY OF PRESENT ILLNESS: Devin Valdez is a 80 y.o. male who presents to the clinic today for:   HPI     Retina Follow Up           Diagnosis: Other   Laterality: left eye   Onset: 7 weeks ago   Severity: mild   Duration: 7 weeks   Course: stable         Comments   7 week fu OS. FP/FFA L/R OCT and Avastin OS   Pt states, "everything seems about the same as it was last time I was here." Pt states VA OU stable since last visit. Pt denies FOL, floaters, or ocular pain OU.          Last edited by Kendra Opitz, COA on 11/17/2021 11:00 AM.      Referring physician: [name], MD [id number] [facility] [address] [place],  [place] [address]  HISTORICAL INFORMATION:   Selected notes from the MEDICAL RECORD NUMBER    Lab Results  Component Value Date   HGBA1C 4.6 (L) 01/14/2020     CURRENT MEDICATIONS: No current outpatient medications on file. (Ophthalmic Drugs)   No current facility-administered medications for this visit. (Ophthalmic Drugs)   Current Outpatient Medications (Other)  Medication Sig   acetaminophen (TYLENOL) 325 MG tablet Take 650 mg by mouth every 6 (six) hours as needed for mild pain or headache.   Ascorbic Acid (VITAMIN C) 1000 MG tablet Take 1,000 mg by mouth daily.   aspirin 81 MG chewable tablet Chew by mouth daily.   atorvastatin (LIPITOR) 40 MG tablet TAKE ONE TABLET (40MG  TOTAL) BY MOUTH DAILY AT 6PM   Coenzyme Q10 (CO Q-10) 200 MG CAPS Take by mouth daily.   ferrous sulfate 325 (65 FE) MG tablet Take 1 tablet (325 mg total) by mouth 2 (two) times daily with a meal.   losartan (COZAAR) 50 MG tablet TAKE ONE (1) TABLET BY MOUTH EVERY DAY FOR BLOOD PRESSURE   Multiple Vitamins-Minerals (CENTRUM SILVER ADULT 50+) TABS Take 1 capsule by mouth 2 (two) times daily.   No current facility-administered medications  for this visit. (Other)      REVIEW OF SYSTEMS:    ALLERGIES No Known Allergies  PAST MEDICAL HISTORY Past Medical History:  Diagnosis Date   Anemia    Anxiety    Cervical stenosis of spinal canal    mri 8/19- severe   Depression    Falls    Hallucinations    Hyperlipidemia    Hypertension    Stroke ([place])    left paracentral pons infarct   Past Surgical History:  Procedure Laterality Date   EYE SURGERY     HERNIA REPAIR     POSTERIOR CERVICAL LAMINECTOMY     with fusion C3-C7 10/19- Dr. Brigitte Pulse at [facility] Family History  Problem Relation Age of Onset   Multiple sclerosis Son    Bipolar disorder Son     SOCIAL HISTORY Social History   Tobacco Use   Smoking status: Former    Types: Cigarettes    Quit date: 11/21/1985    Years since quitting: 36.0   Smokeless tobacco: Never  Substance Use Topics   Alcohol use: No   Drug use: No  OPHTHALMIC EXAM:  Base Eye Exam     Visual Acuity (ETDRS)       Right Left   Dist cc 20/50 20/70 -2   Dist ph cc 20/40 -2 NI         Tonometry (Tonopen, 11:04 AM)       Right Left   Pressure 16 17         Pupils       Pupils Dark Light Shape React APD   Right PERRL 3 3 Round Minimal None   Left PERRL 3 3 Round Minimal None         Visual Fields (Counting fingers)       Left Right    Full Full         Extraocular Movement       Right Left    Full, Ortho Full, Ortho         Neuro/Psych     Oriented x3: Yes   Mood/Affect: Normal         Dilation     Both eyes: 1.0% Mydriacyl, 2.5% Phenylephrine @ 11:04 AM           Slit Lamp and Fundus Exam     External Exam       Right Left   External Normal Normal         Slit Lamp Exam       Right Left   Lids/Lashes Normal Normal   Conjunctiva/Sclera White and quiet White and quiet   Cornea Clear Clear   Anterior Chamber Deep and quiet Deep and quiet   Iris Round and reactive Round and reactive   Lens  Posterior chamber intraocular lens Posterior chamber intraocular lens   Anterior Vitreous Normal Normal         Fundus Exam       Right Left   Posterior Vitreous Normal ,, Central vitreous floaters   Disc Normal, with irregular vessels on the nerve not clearly collaterals collaterals   C/D Ratio 0.35 0.4   Macula Normal Macular thickening less, Microaneurysms, less Cystoid macular edema,  Intraretinal hemorrhage less, large subfoveal druse   Vessels Normal Hemiretinal vein occlusion superiorly.   Periphery Normal Normal            IMAGING AND PROCEDURES  Imaging and Procedures for 11/17/21  OCT, Retina - OU - Both Eyes       Right Eye Quality was good. Scan locations included subfoveal. Central Foveal Thickness: 289. Progression has been stable. Findings include normal foveal contour.   Left Eye Quality was good. Scan locations included subfoveal. Central Foveal Thickness: 306. Progression has been stable. Findings include cystoid macular edema, vitreomacular adhesion , vitreous traction.   Notes Outer retinal photoreceptor EZ layer disruption center fovea accounts for the acuity, OS.  At 7 weeks today, CME and temporal to the [place] from Hemi central vein occlusion has increased.  We will repeat injection today and examination OS next in 9 weeks     Color Fundus Photography Optos - OU - Both Eyes       Right Eye Progression has been stable. Disc findings include normal observations. Macula : normal observations. Vessels : normal observations. Periphery : normal observations.   Left Eye Progression has been stable. Disc findings include normal observations. Macula : microaneurysms.   Notes Old Hemi central retinal vein occlusion left eye with CME     Fluorescein Angiography Optos (Transit OS)       Injection: 500  mg Fluorescein Sodium 10 %   Route: Intravenous   NDC: [phone]   Right Eye   Progression has no prior data. Mid/Late phase findings include  normal observations. Choroidal neovascularization is not present.   Left Eye   Progression has no prior data. Early phase findings include leakage, microaneurysm. Mid/Late phase findings include leakage, microaneurysm. Choroidal neovascularization is not present.   Notes OS with leakages temporally, will continue to monitor this Hemi CRV O which is compensated but with continued CME     Intravitreal Injection, Pharmacologic Agent - OS - Left Eye       Time Out 11/17/2021. 11:41 AM. Confirmed correct patient, procedure, site, and patient consented.   Anesthesia Topical anesthesia was used. Anesthetic medications included Lidocaine 4%.   Procedure Preparation included Tobramycin 0.3%, 10% betadine to eyelids, 5% betadine to ocular surface. A 30 gauge needle was used.   Injection: 2.5 mg bevacizumab 2.5 MG/0.1ML   Route: Intravitreal, Site: Left Eye   NDC: [phone], Lot: [id number]   Post-op Post injection exam found visual acuity of at least counting fingers. The patient tolerated the procedure well. There were no complications. The patient received written and verbal post procedure care education. Post injection medications included ocuflox.              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  Stable overall.  We will repeat injection today and follow-up in 9 weeks to decrease treatment burden.  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 Vitreomacular adhesion of left eye  H43.822     3. Intermediate stage nonexudative age-related macular degeneration of right eye  H35.3112       1.  Repeat intravitreal Avastin OS today to maintain and prevent center involved CSME progression.  Chronic CME OD temporally may be tolerable and necessary to tolerate this compensated Hemi CRV O  2.  Dilate OU as next in 9 weeks  3.  Ophthalmic Meds Ordered this visit:  Meds ordered this encounter  Medications   Fluorescein Sodium 10 % injection 500 mg   bevacizumab (AVASTIN) SOSY 2.5 mg       No follow-ups on file.  There are no Patient Instructions on file for this visit.   Explained the diagnoses, plan, and follow up with the patient and they expressed understanding.  Patient expressed understanding of the importance of proper follow up care.   Clent Demark Fahima Cifelli M.D. Diseases & Surgery of the Retina and Vitreous Retina & Diabetic [place] 11/17/21     Abbreviations: M myopia (nearsighted); A astigmatism; H hyperopia (farsighted); P presbyopia; Mrx spectacle prescription;  CTL contact lenses; OD right eye; OS left eye; OU both eyes  XT exotropia; ET esotropia; PEK punctate epithelial keratitis; PEE punctate epithelial erosions; DES dry eye syndrome; MGD meibomian gland dysfunction; ATs artificial tears; PFAT's preservative free artificial tears; [place]  nuclear sclerotic cataract; PSC posterior subcapsular cataract; ERM epi-retinal membrane; PVD posterior vitreous detachment; RD retinal detachment; DM diabetes mellitus; DR diabetic retinopathy; NPDR non-proliferative diabetic retinopathy; PDR proliferative diabetic retinopathy; CSME clinically significant macular edema; DME diabetic macular edema; dbh dot blot hemorrhages; CWS cotton wool spot; POAG primary open angle glaucoma; C/D cup-to-disc ratio; HVF humphrey visual field; GVF goldmann visual field; OCT optical coherence  tomography; IOP intraocular pressure; BRVO Branch retinal vein occlusion; CRVO central retinal vein occlusion; CRAO central retinal artery occlusion; BRAO branch retinal artery occlusion; RT retinal tear; SB scleral buckle; PPV pars plana vitrectomy; VH Vitreous hemorrhage; PRP panretinal laser photocoagulation; IVK intravitreal kenalog; VMT vitreomacular traction; MH Macular hole;  NVD neovascularization of the disc; NVE neovascularization elsewhere; AREDS age related eye disease study; ARMD age related macular degeneration; POAG primary open angle glaucoma; EBMD epithelial/anterior basement membrane dystrophy; ACIOL anterior chamber intraocular lens; IOL intraocular lens; PCIOL posterior chamber intraocular lens; Phaco/IOL phacoemulsification with intraocular lens placement; [place] photorefractive keratectomy; LASIK laser assisted in situ keratomileusis; HTN hypertension; DM diabetes mellitus; COPD chronic obstructive pulmonary disease

## 2021-11-17 NOTE — Assessment & Plan Note (Signed)
No active complications OD

## 2021-11-17 NOTE — Assessment & Plan Note (Signed)
Minor OS 

## 2021-11-17 NOTE — Assessment & Plan Note (Signed)
Chronic recurrent currently today follow-up interval of 7 weeks.  Stable overall.  We will repeat injection today and follow-up in 9 weeks to decrease treatment burden.

## 2021-11-27 DIAGNOSIS — Z20822 Contact with and (suspected) exposure to covid-19: Secondary | ICD-10-CM | POA: Diagnosis not present

## 2021-12-30 ENCOUNTER — Telehealth: Payer: Self-pay

## 2022-01-19 ENCOUNTER — Encounter (INDEPENDENT_AMBULATORY_CARE_PROVIDER_SITE_OTHER): Payer: Medicare Other | Admitting: Ophthalmology

## 2022-01-25 ENCOUNTER — Other Ambulatory Visit: Payer: Self-pay

## 2022-01-25 ENCOUNTER — Ambulatory Visit (INDEPENDENT_AMBULATORY_CARE_PROVIDER_SITE_OTHER): Payer: Medicare Other | Admitting: Ophthalmology

## 2022-01-25 ENCOUNTER — Encounter (INDEPENDENT_AMBULATORY_CARE_PROVIDER_SITE_OTHER): Payer: Self-pay | Admitting: Ophthalmology

## 2022-01-25 DIAGNOSIS — H353122 Nonexudative age-related macular degeneration, left eye, intermediate dry stage: Secondary | ICD-10-CM

## 2022-01-25 DIAGNOSIS — H43822 Vitreomacular adhesion, left eye: Secondary | ICD-10-CM

## 2022-01-25 DIAGNOSIS — H34812 Central retinal vein occlusion, left eye, with macular edema: Secondary | ICD-10-CM

## 2022-01-25 MED ORDER — BEVACIZUMAB 2.5 MG/0.1ML IZ SOSY
2.5000 mg | PREFILLED_SYRINGE | INTRAVITREAL | Status: AC | PRN
  Administered 2022-01-25: 2.5 mg via INTRAVITREAL

## 2022-01-25 NOTE — Assessment & Plan Note (Signed)
Physiologic and minor ?

## 2022-01-25 NOTE — Assessment & Plan Note (Signed)
Stable OS 

## 2022-01-25 NOTE — Progress Notes (Signed)
01/25/2022     CHIEF COMPLAINT Patient presents for  Chief Complaint  Patient presents with   Retina Follow Up      HISTORY OF PRESENT ILLNESS: Devin Valdez is a 81 y.o. male who presents to the clinic today for:   HPI     Retina Follow Up           Diagnosis: Other (Hemispheric retinal vein occlusion with macular edema)   Laterality: left eye   Onset: 10 weeks ago         Comments   9 weeks and 6 days, dilate OU, Avastin OD. Last note only shows DILATE OU. Patient states vision is stable and unchanged since last visit. Denies any new floaters or FOL.       Last edited by Laurin Coder on 01/25/2022 10:42 AM.      Referring physician: [name], MD [address] [facility] [address] [place],  [place] [address]  HISTORICAL INFORMATION:   Selected notes from the MEDICAL RECORD NUMBER    Lab Results  Component Value Date   HGBA1C 4.6 (L) 01/14/2020     CURRENT MEDICATIONS: No current outpatient medications on file. (Ophthalmic Drugs)   No current facility-administered medications for this visit. (Ophthalmic Drugs)   Current Outpatient Medications (Other)  Medication Sig   acetaminophen (TYLENOL) 325 MG tablet Take 650 mg by mouth every 6 (six) hours as needed for mild pain or headache.   Ascorbic Acid (VITAMIN C) 1000 MG tablet Take 1,000 mg by mouth daily.   aspirin 81 MG chewable tablet Chew by mouth daily.   atorvastatin (LIPITOR) 40 MG tablet TAKE ONE TABLET ('40MG'$  TOTAL) BY MOUTH DAILY AT 6PM   Coenzyme Q10 (CO Q-10) 200 MG CAPS Take by mouth daily.   ferrous sulfate 325 (65 FE) MG tablet Take 1 tablet (325 mg total) by mouth 2 (two) times daily with a meal.   losartan (COZAAR) 50 MG tablet TAKE ONE (1) TABLET BY MOUTH EVERY DAY FOR BLOOD PRESSURE   Multiple Vitamins-Minerals (CENTRUM SILVER ADULT 50+) TABS Take 1 capsule by mouth 2 (two) times daily.   No current facility-administered medications for this visit. (Other)      REVIEW OF  SYSTEMS: ROS   Negative for: Constitutional, Gastrointestinal, Neurological, Skin, Genitourinary, Musculoskeletal, HENT, Endocrine, Cardiovascular, Eyes, Respiratory, Psychiatric, Allergic/Imm, Heme/Lymph Last edited by Hurman Horn, MD on 01/25/2022 11:37 AM.       ALLERGIES No Known Allergies  PAST MEDICAL HISTORY Past Medical History:  Diagnosis Date   Anemia    Anxiety    Cervical stenosis of spinal canal    mri 8/19- severe   Depression    Falls    Hallucinations    Hyperlipidemia    Hypertension    Stroke ([place])    left paracentral pons infarct   Past Surgical History:  Procedure Laterality Date   EYE SURGERY     HERNIA REPAIR     POSTERIOR CERVICAL LAMINECTOMY     with fusion C3-C7 10/19- Dr. Brigitte Pulse at [place] Family History  Problem Relation Age of Onset   Multiple sclerosis Son    Bipolar disorder Son     SOCIAL HISTORY Social History   Tobacco Use   Smoking status: Former    Types: Cigarettes    Quit date: 11/21/1985    Years since quitting: 36.2   Smokeless tobacco: Never  Substance Use Topics   Alcohol use: No  Drug use: No         OPHTHALMIC EXAM:  Base Eye Exam     Visual Acuity (ETDRS)       Right Left   Dist cc 20/30 -2 20/50 -2   Dist ph cc  NI    Correction: Glasses         Tonometry (Tonopen, 10:40 AM)       Right Left   Pressure 14 12         Pupils       Pupils Dark Light React APD   Right PERRL 3 3 Minimal None   Left PERRL 3 3 Minimal None         Extraocular Movement       Right Left    Full Full         Neuro/Psych     Oriented x3: Yes   Mood/Affect: Normal         Dilation     Both eyes: 1.0% Mydriacyl, 2.5% Phenylephrine @ 10:40 AM           Slit Lamp and Fundus Exam     External Exam       Right Left   External Normal Normal         Slit Lamp Exam       Right Left   Lids/Lashes Normal Normal   Conjunctiva/Sclera White and quiet White and quiet    Cornea Clear Clear   Anterior Chamber Deep and quiet Deep and quiet   Iris Round and reactive Round and reactive   Lens Posterior chamber intraocular lens Posterior chamber intraocular lens   Anterior Vitreous Normal Normal         Fundus Exam       Right Left   Posterior Vitreous Normal ,, Central vitreous floaters   Disc Normal, with irregular vessels on the nerve not clearly collaterals collaterals   C/D Ratio 0.35 0.4   Macula Normal Macular thickening less, Microaneurysms, less Cystoid macular edema,  Intraretinal hemorrhage less, large subfoveal druse   Vessels Normal Hemiretinal vein occlusion superiorly.   Periphery Normal Normal            IMAGING AND PROCEDURES  Imaging and Procedures for 01/25/22  OCT, Retina - OU - Both Eyes       Right Eye Quality was good. Scan locations included subfoveal. Central Foveal Thickness: 285. Progression has been stable. Findings include normal foveal contour.   Left Eye Quality was good. Scan locations included subfoveal. Central Foveal Thickness: 304. Progression has been stable. Findings include cystoid macular edema, vitreomacular adhesion , vitreous traction.   Notes Outer retinal photoreceptor EZ layer disruption center fovea accounts for the acuity, OS.  At 9 weeks today, CME and temporal to the [place] from Hemi central vein occlusion has increased.  We will repeat injection today and examination OS next in 8 weeks     Intravitreal Injection, Pharmacologic Agent - OS - Left Eye       Time Out 01/25/2022. 11:41 AM. Confirmed correct patient, procedure, site, and patient consented.   Anesthesia Topical anesthesia was used. Anesthetic medications included Lidocaine 4%.   Procedure Preparation included Tobramycin 0.3%, 10% betadine to eyelids, 5% betadine to ocular surface. A 30 gauge needle was used.   Injection: 2.5 mg bevacizumab 2.5 MG/0.1ML   Route: Intravitreal, Site: Left Eye   NDC: [phone], Lot: [id number]    Post-op Post injection exam found visual acuity of at least counting fingers.  The patient tolerated the procedure well. There were no complications. The patient received written and verbal post procedure care education. Post injection medications included ocuflox.              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 Intermediate stage nonexudative age-related macular degeneration of left eye  H35.3122     3. Vitreomacular adhesion of left eye  H43.822       1.  OS with center involved CME overall stable or slightly better yet but 9 weeks still active temporally  2.  Repeat injection Avastin OS today and examination OS next in 8 weeks  3.  Ophthalmic Meds Ordered this visit:  Meds ordered this encounter  Medications   bevacizumab (AVASTIN) SOSY 2.5 mg       Return in about 8 weeks (around 03/22/2022) for dilate, OS, AVASTIN OCT.  There are no Patient Instructions on file for this visit.   Explained the diagnoses, plan, and follow up with the patient and they expressed understanding.  Patient expressed understanding of the importance of proper follow up care.   Clent Demark Pruitt Taboada M.D. Diseases & Surgery of the Retina and Vitreous Retina & Diabetic [place] 01/25/22     Abbreviations: M myopia (nearsighted); A  astigmatism; H hyperopia (farsighted); P presbyopia; Mrx spectacle prescription;  CTL contact lenses; OD right eye; OS left eye; OU both eyes  XT exotropia; ET esotropia; PEK punctate epithelial keratitis; PEE punctate epithelial erosions; DES dry eye syndrome; MGD meibomian gland dysfunction; ATs artificial tears; PFAT's preservative free artificial tears; [place] nuclear sclerotic cataract; PSC posterior subcapsular cataract; ERM epi-retinal membrane; PVD posterior vitreous detachment; RD retinal detachment; DM diabetes mellitus; DR diabetic retinopathy; NPDR non-proliferative diabetic retinopathy; PDR proliferative diabetic retinopathy; CSME clinically significant macular edema; DME diabetic macular edema; dbh dot blot hemorrhages; CWS cotton wool spot; POAG primary open angle glaucoma; C/D cup-to-disc ratio; HVF humphrey visual field; GVF goldmann visual field; OCT optical coherence tomography; IOP intraocular pressure; BRVO Branch retinal vein occlusion; CRVO central retinal vein occlusion; CRAO central retinal artery occlusion; BRAO branch retinal artery occlusion; RT retinal tear; SB scleral buckle; PPV pars plana vitrectomy; VH Vitreous hemorrhage; PRP panretinal laser photocoagulation; IVK intravitreal kenalog; VMT vitreomacular traction; MH Macular hole;  NVD neovascularization of the disc; NVE neovascularization elsewhere; AREDS age related eye disease study; ARMD age related macular degeneration; POAG primary open angle glaucoma; EBMD epithelial/anterior basement membrane dystrophy; ACIOL anterior chamber intraocular lens; IOL intraocular lens; PCIOL posterior chamber intraocular lens; Phaco/IOL phacoemulsification with intraocular lens placement; [place] photorefractive keratectomy; LASIK laser assisted in situ keratomileusis; HTN hypertension; DM diabetes mellitus; COPD chronic obstructive pulmonary disease

## 2022-01-25 NOTE — Assessment & Plan Note (Signed)
Center involved CME stable or slightly improved at 9 weeks yet still active thickening temporally, will need repeat Avastin OS today and reexamination OS in 8 weeks ?

## 2022-01-27 ENCOUNTER — Encounter (INDEPENDENT_AMBULATORY_CARE_PROVIDER_SITE_OTHER): Payer: Medicare Other | Admitting: Ophthalmology

## 2022-03-07 ENCOUNTER — Other Ambulatory Visit: Payer: Self-pay | Admitting: Family Medicine

## 2022-03-22 ENCOUNTER — Encounter (INDEPENDENT_AMBULATORY_CARE_PROVIDER_SITE_OTHER): Payer: Medicare Other | Admitting: Ophthalmology

## 2022-03-25 DIAGNOSIS — Z20822 Contact with and (suspected) exposure to covid-19: Secondary | ICD-10-CM | POA: Diagnosis not present

## 2022-03-31 ENCOUNTER — Encounter (INDEPENDENT_AMBULATORY_CARE_PROVIDER_SITE_OTHER): Payer: Medicare Other | Admitting: Ophthalmology

## 2022-03-31 ENCOUNTER — Encounter (INDEPENDENT_AMBULATORY_CARE_PROVIDER_SITE_OTHER): Payer: Self-pay | Admitting: Ophthalmology

## 2022-03-31 ENCOUNTER — Ambulatory Visit (INDEPENDENT_AMBULATORY_CARE_PROVIDER_SITE_OTHER): Payer: Medicare Other | Admitting: Ophthalmology

## 2022-03-31 DIAGNOSIS — H43822 Vitreomacular adhesion, left eye: Secondary | ICD-10-CM | POA: Diagnosis not present

## 2022-03-31 DIAGNOSIS — H353122 Nonexudative age-related macular degeneration, left eye, intermediate dry stage: Secondary | ICD-10-CM

## 2022-03-31 DIAGNOSIS — H34812 Central retinal vein occlusion, left eye, with macular edema: Secondary | ICD-10-CM

## 2022-03-31 MED ORDER — BEVACIZUMAB 2.5 MG/0.1ML IZ SOSY
2.5000 mg | PREFILLED_SYRINGE | INTRAVITREAL | Status: AC | PRN
  Administered 2022-03-31: 2.5 mg via INTRAVITREAL

## 2022-03-31 NOTE — Assessment & Plan Note (Signed)
Stable OS 

## 2022-03-31 NOTE — Assessment & Plan Note (Signed)
Chronic active CME temporal to FAZ threatening FAZ OS, stable at 9 weeks yet needs improvement.  Repeat injection Avastin today and shorten follow-up interval next to 7 weeks ?

## 2022-03-31 NOTE — Progress Notes (Signed)
? ? ?03/31/2022 ? ?  ? ?CHIEF COMPLAINT ?Patient presents for  ?Chief Complaint  ?Patient presents with  ? Retina Follow Up  ? ? ? ? ?HISTORY OF PRESENT ILLNESS: ?Devin Valdez is a 81 y.o. male who presents to the clinic today for:  ? ?HPI   ? ? Retina Follow Up   ? ?      ? Diagnosis: Other (Hemispheric Retinal Vein Occlusion with macular Edema )  ? Laterality: left eye  ? Onset: 9 weeks ago  ? ?  ?  ? ? Comments   ?9 weeks dilate OD, Avastin OS, OCT. ?Patient states vision is stable and unchanged since last visit. Denies any new floaters or FOL. ?No allergies to any medications. ?No hospitalizations or surgeries since last visit. ? ?  ?  ?Last edited by Laurin Coder on 03/31/2022 10:01 AM.  ?  ? ? ?Referring physician: ?[name], MD ?[address] [place],  [place] [address] ? ?HISTORICAL INFORMATION:  ? ?Selected notes from the [facility] ?  ? ?Lab Results  ?Component Value Date  ? HGBA1C 4.6 (L) 01/14/2020  ?  ? ?CURRENT MEDICATIONS: ?No current outpatient medications on file. (Ophthalmic Drugs)  ? ?No current facility-administered medications for this visit. (Ophthalmic Drugs)  ? ?Current Outpatient Medications (Other)  ?Medication Sig  ? acetaminophen (TYLENOL) 325 MG tablet Take 650 mg by mouth every 6 (six) hours as needed for mild pain or headache.  ? Ascorbic Acid (VITAMIN C) 1000 MG tablet Take 1,000 mg by mouth daily.  ? aspirin 81 MG chewable tablet Chew by mouth daily.  ? atorvastatin (LIPITOR) 40 MG tablet TAKE ONE TABLET ('40MG'$  TOTAL) BY MOUTH DAILY AT 6PM  ? Coenzyme Q10 (CO Q-10) 200 MG CAPS Take by mouth daily.  ? ferrous sulfate 325 (65 FE) MG tablet Take 1 tablet (325 mg total) by mouth 2 (two) times daily with a meal.  ? losartan (COZAAR) 50 MG tablet TAKE ONE (1) TABLET BY MOUTH EVERY DAY FOR BLOOD PRESSURE  ? Multiple Vitamins-Minerals (CENTRUM SILVER ADULT 50+) TABS Take 1 capsule by mouth 2 (two) times daily.  ? ?No current facility-administered medications for  this visit. (Other)  ? ? ? ? ?REVIEW OF SYSTEMS: ? ? ? ?ALLERGIES ?No Known Allergies ? ?PAST MEDICAL HISTORY ?Past Medical History:  ?Diagnosis Date  ? Anemia   ? Anxiety   ? Cervical stenosis of spinal canal   ? mri 8/19- severe  ? Depression   ? Falls   ? Hallucinations   ? Hyperlipidemia   ? Hypertension   ? Stroke [facility])   ? left paracentral pons infarct  ? ?Past Surgical History:  ?Procedure Laterality Date  ? EYE SURGERY    ? HERNIA REPAIR    ? POSTERIOR CERVICAL LAMINECTOMY    ? with fusion C3-C7 10/19- Dr. Brigitte Pulse at [facility]  ? ? ?FAMILY HISTORY ?Family History  ?Problem Relation Age of Onset  ? Multiple sclerosis Son   ? Bipolar disorder Son   ? ? ?SOCIAL HISTORY ?Social History  ? ?Tobacco Use  ? Smoking status: Former  ?  Types: Cigarettes  ?  Quit date: 11/21/1985  ?  Years since quitting: 36.3  ? Smokeless tobacco: Never  ?Substance Use Topics  ? Alcohol use: No  ? Drug use: No  ? ?  ? ?  ? ?OPHTHALMIC EXAM: ? ?Base Eye Exam   ? ? Visual Acuity (ETDRS)   ? ?  Right Left  ? Dist cc 20/50 -1 20/50 +1  ? Dist ph cc 20/30 -1 20/40 -1  ? ? Correction: Glasses  ?Patient is wearing prescription sunglasses. ? ?  ?  ? ? Tonometry (Tonopen, 10:06 AM)   ? ?   Right Left  ? Pressure 10 12  ? ?  ?  ? ? Pupils   ? ?   Pupils Dark Light React APD  ? Right PERRL 3 3 Minimal None  ? Left PERRL 3 3 Minimal None  ? ?  ?  ? ? Visual Fields   ? ?   Left Right  ?  Full Full  ? ?  ?  ? ? Extraocular Movement   ? ?   Right Left  ?  Full, Ortho Full, Ortho  ? ?  ?  ? ? Neuro/Psych   ? ? Oriented x3: Yes  ? Mood/Affect: Normal  ? ?  ?  ? ? Dilation   ? ? Left eye: 1.0% Mydriacyl, 2.5% Phenylephrine @ 10:06 AM  ? ?  ?  ? ?  ? ?Slit Lamp and Fundus Exam   ? ? External Exam   ? ?   Right Left  ? External Normal Normal  ? ?  ?  ? ? Slit Lamp Exam   ? ?   Right Left  ? Lids/Lashes Normal Normal  ? Conjunctiva/Sclera White and quiet White and quiet  ? Cornea Clear Clear  ? Anterior Chamber Deep and quiet Deep and quiet  ? Iris Round and  reactive Round and reactive  ? Lens Posterior chamber intraocular lens Posterior chamber intraocular lens  ? Anterior Vitreous Normal Normal  ? ?  ?  ? ? Fundus Exam   ? ?   Right Left  ? Posterior Vitreous  ,, Central vitreous floaters  ? Disc  collaterals  ? C/D Ratio  0.4  ? Macula  Macular thickening Microaneurysms, Cystoid macular edema, Intraretinal hemorrhage less, large subfoveal druse, Exudates temporally  ? Vessels  Hemiretinal vein occlusion superiorly.  ? Periphery  Normal  ? ?  ?  ? ?  ? ? ?IMAGING AND PROCEDURES  ?Imaging and Procedures for 03/31/22 ? ?Intravitreal Injection, Pharmacologic Agent - OS - Left Eye   ? ?   ?Time Out ?03/31/2022. 10:23 AM. Confirmed correct patient, procedure, site, and patient consented.  ? ?Anesthesia ?Topical anesthesia was used. Anesthetic medications included Lidocaine 4%.  ? ?Procedure ?Preparation included Tobramycin 0.3%, 10% betadine to eyelids, 5% betadine to ocular surface. A 30 gauge needle was used.  ? ?Injection: ?2.5 mg bevacizumab 2.5 MG/0.1ML ?  Route: Intravitreal, Site: Left Eye ?  [place]: 16109-604-54, Lot: [id number]  ? ?Post-op ?Post injection exam found visual acuity of at least counting fingers. The patient tolerated the procedure well. There were no complications. The patient received written and verbal post procedure care education. Post injection medications included ocuflox.  ? ?  ? ?Color Fundus Photography Optos - OU - Both Eyes   ? ?   ?Right Eye ?Progression has been stable. Disc findings include normal observations. Macula : normal observations. Vessels : normal observations. Periphery : normal observations.  ? ?Left Eye ?Progression has been stable. Disc findings include normal observations. Macula : microaneurysms, edema.  ? ?Notes ?Old Hemi central retinal vein occlusion left eye with CME ? ?  ? ? ?  ?  ? ?  ?ASSESSMENT/PLAN: ? 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  Repeat injection Avastin today and shorten follow-up interval next to 7 weeks ? ?Intermediate stage nonexudative age-related macular degeneration of left eye ?Stable OS ? ?Vitreomacular adhesion of left eye ?Physiologic OS  ? ?  ICD-10-CM   ?1. 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  Color Fundus Photography Optos - OU - Both Eyes  ?  bevacizumab (AVASTIN) SOSY 2.5 mg  ?  ?2. Intermediate stage nonexudative age-related macular degeneration of left eye  H35.3122   ?  ?3. Vitreomacular adhesion of left eye  H43.822   ?  ? ? ?1.  OS with persistence and slight increase in CME from Hemi CRV O threatening center involvement OS.  At 9-week interval today.  Post Avastin.  Repeat injection today and shorten interval follow-up next ? ?2.  We will restudy macular perfusion OS and OD next with FFA prior to possible injection ? ?3. ? ?Ophthalmic Meds Ordered this visit:  ?Meds ordered this encounter  ?Medications  ? bevacizumab (AVASTIN) SOSY 2.5 mg  ? ? ?  ? ?Return in about 7 weeks (around 05/19/2022) for DILATE OU, OPTOS FFA L/R, COLOR FP, AVASTIN OCT, OS. ? ?There are no Patient Instructions on file for this visit. ? ? ?Explained the diagnoses, plan, and follow up with the patient and they expressed understanding.  Patient expressed understanding of the importance of proper follow up care.  ? ?Clent Demark. Lindberg Zenon M.D. ?Diseases & Surgery of the Retina and Vitreous ?[place] ?03/31/22 ? ? ? ? ?Abbreviations: ?M myopia (nearsighted); A astigmatism; H hyperopia (farsighted); P presbyopia; Mrx spectacle prescription;  CTL contact lenses; OD right eye; OS left eye; OU both eyes  XT exotropia; ET esotropia; PEK punctate epithelial keratitis; PEE punctate epithelial erosions; DES dry eye syndrome; MGD meibomian gland dysfunction; ATs artificial tears; PFAT's preservative  free artificial tears; [place] nuclear sclerotic cataract; PSC posterior subcapsular cataract; ERM epi-retinal membrane; PVD posterior vitreous detachment; RD retinal detachment; DM diabetes mellitus; DR d

## 2022-03-31 NOTE — Assessment & Plan Note (Signed)
Physiologic OS 

## 2022-04-04 ENCOUNTER — Ambulatory Visit (INDEPENDENT_AMBULATORY_CARE_PROVIDER_SITE_OTHER): Payer: Medicare Other | Admitting: Family Medicine

## 2022-04-04 VITALS — BP 116/68 | HR 66 | Temp 97.9°F | Ht 67.0 in | Wt 156.2 lb

## 2022-04-04 DIAGNOSIS — D649 Anemia, unspecified: Secondary | ICD-10-CM

## 2022-04-04 DIAGNOSIS — Z8673 Personal history of transient ischemic attack (TIA), and cerebral infarction without residual deficits: Secondary | ICD-10-CM

## 2022-04-04 DIAGNOSIS — E7800 Pure hypercholesterolemia, unspecified: Secondary | ICD-10-CM

## 2022-04-04 DIAGNOSIS — I10 Essential (primary) hypertension: Secondary | ICD-10-CM | POA: Diagnosis not present

## 2022-04-04 LAB — CBC WITH DIFFERENTIAL/PLATELET
Absolute Monocytes: 707 cells/uL (ref 200–950)
Basophils Absolute: 23 cells/uL (ref 0–200)
Basophils Relative: 0.4 %
Eosinophils Absolute: 131 cells/uL (ref 15–500)
Eosinophils Relative: 2.3 %
HCT: 34.2 % — ABNORMAL LOW (ref 38.5–50.0)
Hemoglobin: 11.1 g/dL — ABNORMAL LOW (ref 13.2–17.1)
Lymphs Abs: 878 cells/uL (ref 850–3900)
MCH: 28.9 pg (ref 27.0–33.0)
MCHC: 32.5 g/dL (ref 32.0–36.0)
MCV: 89.1 fL (ref 80.0–100.0)
MPV: 10.9 fL (ref 7.5–12.5)
Monocytes Relative: 12.4 %
Neutro Abs: 3962 cells/uL (ref 1500–7800)
Neutrophils Relative %: 69.5 %
Platelets: 198 10*3/uL (ref 140–400)
RBC: 3.84 10*6/uL — ABNORMAL LOW (ref 4.20–5.80)
RDW: 11.5 % (ref 11.0–15.0)
Total Lymphocyte: 15.4 %
WBC: 5.7 10*3/uL (ref 3.8–10.8)

## 2022-04-04 LAB — COMPLETE METABOLIC PANEL WITH GFR
AG Ratio: 2 (calc) (ref 1.0–2.5)
ALT: 21 U/L (ref 9–46)
AST: 19 U/L (ref 10–35)
Albumin: 4.2 g/dL (ref 3.6–5.1)
Alkaline phosphatase (APISO): 84 U/L (ref 35–144)
BUN: 18 mg/dL (ref 7–25)
CO2: 29 mmol/L (ref 20–32)
Calcium: 9.9 mg/dL (ref 8.6–10.3)
Chloride: 106 mmol/L (ref 98–110)
Creat: 1.22 mg/dL (ref 0.70–1.22)
Globulin: 2.1 g/dL (calc) (ref 1.9–3.7)
Glucose, Bld: 104 mg/dL — ABNORMAL HIGH (ref 65–99)
Potassium: 4.6 mmol/L (ref 3.5–5.3)
Sodium: 141 mmol/L (ref 135–146)
Total Bilirubin: 0.6 mg/dL (ref 0.2–1.2)
Total Protein: 6.3 g/dL (ref 6.1–8.1)
eGFR: 60 mL/min/{1.73_m2} (ref >=60)

## 2022-04-04 LAB — LIPID PANEL
Cholesterol: 129 mg/dL (ref <200)
HDL: 48 mg/dL (ref >=40)
LDL Cholesterol (Calc): 60 mg/dL (calc)
Non-HDL Cholesterol (Calc): 81 mg/dL (calc) (ref <130)
Total CHOL/HDL Ratio: 2.7 (calc) (ref <5.0)
Triglycerides: 126 mg/dL (ref <150)

## 2022-04-04 NOTE — Progress Notes (Signed)
? ?Subjective:  ? ? Patient ID: Devin Valdez, male    DOB: [birth year], 81 y.o.   MRN: [id number] ? ?HPI ?Patient is a very pleasant 81 year old African-American gentleman with a history of stroke in his left paracentral pons.  He also has a history of cervical stenosis and spinal canal.  Due to both of these conditions he has weakness and gait imbalance.  However he is walking without a cane.  He has a slow slightly unsteady gait but he is able to get around and he denies any falls.  He denies any memory loss.  He denies any chest pain or shortness of breath.  His wife is concerned because he is not exercising enough.  His blood pressure today is outstanding at [place].  He denies any nausea or vomiting or abdominal pain.  He denies any hematuria.  He denies any hemoptysis or pleurisy or chest pain.  He denies any syncope or near syncope or palpitations. ?Past Medical History:  ?Diagnosis Date  ? Anemia   ? Anxiety   ? Cervical stenosis of spinal canal   ? mri 8/19- severe  ? Depression   ? Falls   ? Hallucinations   ? Hyperlipidemia   ? Hypertension   ? Stroke [facility])   ? left paracentral pons infarct  ? ?Past Surgical History:  ?Procedure Laterality Date  ? EYE SURGERY    ? HERNIA REPAIR    ? POSTERIOR CERVICAL LAMINECTOMY    ? with fusion C3-C7 10/19- Dr. Brigitte Pulse at [facility]  ? ?Current Outpatient Medications on File Prior to Visit  ?Medication Sig Dispense Refill  ? acetaminophen (TYLENOL) 325 MG tablet Take 650 mg by mouth every 6 (six) hours as needed for mild pain or headache.    ? Ascorbic Acid (VITAMIN C) 1000 MG tablet Take 1,000 mg by mouth daily.    ? aspirin 81 MG chewable tablet Chew by mouth daily.    ? atorvastatin (LIPITOR) 40 MG tablet TAKE ONE TABLET ('40MG'$  TOTAL) BY MOUTH DAILY AT 6PM 90 tablet 1  ? Coenzyme Q10 (CO Q-10) 200 MG CAPS Take by mouth daily.    ? ferrous sulfate 325 (65 FE) MG tablet Take 1 tablet (325 mg total) by mouth 2 (two) times daily with a meal. 60 tablet 1  ? losartan (COZAAR)  50 MG tablet TAKE ONE (1) TABLET BY MOUTH EVERY DAY FOR BLOOD PRESSURE 90 tablet 3  ? Multiple Vitamins-Minerals (CENTRUM SILVER ADULT 50+) TABS Take 1 capsule by mouth 2 (two) times daily. 30 tablet 2  ? ?No current facility-administered medications on file prior to visit.  ? ?No Known Allergies ?Social History  ? ?Socioeconomic History  ? Marital status: Married  ?  Spouse name: Not on file  ? Number of children: Not on file  ? Years of education: Not on file  ? Highest education level: Not on file  ?Occupational History  ? Not on file  ?Tobacco Use  ? Smoking status: Former  ?  Types: Cigarettes  ?  Quit date: 11/21/1985  ?  Years since quitting: 36.3  ? Smokeless tobacco: Never  ?Substance and Sexual Activity  ? Alcohol use: No  ? Drug use: No  ? Sexual activity: Yes  ?Other Topics Concern  ? Not on file  ?Social History Narrative  ? Not on file  ? ?Social Determinants of Health  ? ?Financial Resource Strain: Low Risk   ? Difficulty of Paying Living Expenses: Not hard at all  ?Food  Insecurity: Not on file  ?Transportation Needs: Not on file  ?Physical Activity: Not on file  ?Stress: Not on file  ?Social Connections: Not on file  ?Intimate Partner Violence: Not on file  ? ? ?Review of Systems  ?All other systems reviewed and are negative. ? ?   ?Objective:  ? Physical Exam ?Constitutional:   ?   Appearance: Normal appearance. He is normal weight.  ?Cardiovascular:  ?   Rate and Rhythm: Normal rate and regular rhythm. FrequentExtrasystoles are present. ?   Heart sounds: Normal heart sounds. No murmur heard. ?  No friction rub. No gallop.  ?Pulmonary:  ?   Effort: Pulmonary effort is normal. No respiratory distress.  ?   Breath sounds: Normal breath sounds. No stridor. No wheezing, rhonchi or rales.  ?Chest:  ?   Chest wall: No tenderness.  ?Abdominal:  ?   General: Abdomen is flat. Bowel sounds are normal.  ?   Palpations: Abdomen is soft.  ?Musculoskeletal:  ?   Right lower leg: No edema.  ?   Left lower leg: No  edema.  ?Neurological:  ?   General: No focal deficit present.  ?   Mental Status: He is alert and oriented to person, place, and time. Mental status is at baseline.  ?   Cranial Nerves: No cranial nerve deficit.  ?   Sensory: Sensation is intact. No sensory deficit.  ?   Motor: No weakness or seizure activity.  ?   Coordination: Coordination is intact. Coordination normal.  ?   Gait: Gait normal.  ?   Deep Tendon Reflexes: Reflexes are normal and symmetric.  ? ? ? ? ? ?   ?Assessment & Plan:  ?Pure hypercholesterolemia - Plan: CBC with Differential/Platelet, Lipid panel, COMPLETE METABOLIC PANEL WITH GFR ? ?Benign essential HTN - Plan: CBC with Differential/Platelet, Lipid panel, COMPLETE METABOLIC PANEL WITH GFR ? ?Anemia, unspecified type ? ?History of CVA (cerebrovascular accident) - Plan: CBC with Differential/Platelet, Lipid panel, COMPLETE METABOLIC PANEL WITH GFR ?Blood pressure today is outstanding.  Patient has a slightly unsteady gait.  I believe this is getting worse due to deconditioning and lack of exercise.  Therefore I recommend the patient try to start walking 5 to 10 minutes a day and gradually build up duration and distance to improve the strength in his legs.  I will check his fasting lipid panel.  His goal LDL cholesterol is less than 70.  Also monitor his anemia with a CBC. ?

## 2022-04-05 ENCOUNTER — Ambulatory Visit (HOSPITAL_COMMUNITY)
Admission: EM | Admit: 2022-04-05 | Discharge: 2022-04-05 | Disposition: A | Discharge status: Home / Self Care | Payer: Medicare Other | Attending: Student | Admitting: Student

## 2022-04-05 ENCOUNTER — Encounter (HOSPITAL_COMMUNITY): Payer: Self-pay | Admitting: *Deleted

## 2022-04-05 ENCOUNTER — Other Ambulatory Visit: Payer: Self-pay

## 2022-04-05 DIAGNOSIS — L739 Follicular disorder, unspecified: Secondary | ICD-10-CM | POA: Diagnosis not present

## 2022-04-05 MED ORDER — DOXYCYCLINE HYCLATE 100 MG PO CAPS: 100.0000 mg | ORAL_CAPSULE | Freq: Two times a day (BID) | ORAL | 0 refills | Status: AC

## 2022-04-05 NOTE — Discharge Instructions (Addendum)
-  Doxycycline twice daily for 7 days.  Make sure to wear sunscreen while spending time outside while on this medication as it can increase your chance of sunburn. You can take this medication with food if you have a sensitive stomach. ?-Warm compresses ?-Wash with gentle soap and water only ?-Your iron level is slightly low, but the same as your level 9 months ago. Discuss this with your PCP - they may start a low-dose of iron supplementation, or simply keep an eye on it. Your other labs looked good including your kidney function and lipids/triglycerides.  ?

## 2022-04-05 NOTE — ED Triage Notes (Signed)
Pt reports a boil in Lt axilla for unknown length of time. ?

## 2022-04-05 NOTE — ED Provider Notes (Signed)
?[place] [facility] ? ? ? ?CSN: [id number] ?Arrival date & time: 04/05/22  6301 ? ? ?  ? ?History   ?Chief Complaint ?Chief Complaint  ?Patient presents with  ? Abscess  ? ? ?HPI ?Devin Valdez is a 81 y.o. male presenting with boil L axilla that he noticed about 1 day ago, spontaneously draining. History hypertension, stroke. States he has been using warm compresses and has elicited purulent drainage. Feeling well, without fevers/chills.   ? ?HPI ? ?Past Medical History:  ?Diagnosis Date  ? Anemia   ? Anxiety   ? Cervical stenosis of spinal canal   ? mri 8/19- severe  ? Depression   ? Falls   ? Hallucinations   ? Hyperlipidemia   ? Hypertension   ? Stroke [facility])   ? left paracentral pons infarct  ? ? ?Patient Active Problem List  ? Diagnosis Date Noted  ? 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 Paresthesia of both hands 08/25/2020  ? 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 Intermediate stage nonexudative age-related macular degeneration of left eye 06/17/2020  ? 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 Vitreomacular adhesion of left eye 04/08/2020  ? Cystoid macular edema of left eye 04/08/2020  ? 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 Acute right-sided weakness 01/13/2020  ? Difficulty urinating 01/13/2020  ? Stroke [facility])   ? CVA (cerebral vascular accident) ([place]) 12/20/2019  ? 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 PVC (premature ventricular contraction)   ? Cervical stenosis of spinal canal   ? Failure to thrive in adult 05/26/2017  ? Failure to thrive (0-17) 05/25/2017  ? Abdominal pulsatile mass 05/25/2017  ? Anemia 05/25/2017  ? Essential hypertension 05/25/2017  ? Gait instability 05/20/2017  ? Frequent falls 05/20/2017  ? Depression 05/20/2017  ? ? ?Past Surgical History:  ?Procedure Laterality Date  ? EYE SURGERY    ? HERNIA REPAIR    ? POSTERIOR CERVICAL LAMINECTOMY    ?  with fusion C3-C7 10/19- Dr. Brigitte Pulse at [facility]  ? ? ? ? ? ?Home Medications   ? ?Prior to Admission medications   ?Medication Sig Start Date End Date Taking? Authorizing Provider  ?doxycycline (VIBRAMYCIN) 100 MG capsule Take 1 capsule (100 mg total) by mouth 2 (two) times daily for 7 days. 04/05/22 04/12/22 Yes Hazel Sams, PA-C  ?acetaminophen (TYLENOL) 325 MG tablet Take 650 mg by mouth every 6 (six) hours as needed for mild pain or headache.    [provider]  ?Ascorbic Acid (VITAMIN C) 1000 MG tablet Take 1,000 mg by mouth daily.    [provider]  ?aspirin 81 MG chewable tablet Chew by mouth daily.    [provider]  ?atorvastatin (LIPITOR) 40 MG tablet TAKE ONE TABLET ('40MG'$  TOTAL) BY MOUTH DAILY AT 6PM 03/08/22   [name], MD  ?Coenzyme Q10 (CO Q-10) 200 MG CAPS Take by mouth daily.    [provider]  ?ferrous sulfate 325 (65 FE) MG tablet Take 1 tablet (325 mg total) by mouth 2 (two) times daily with a meal. 05/28/17   Theodis Blaze, MD  ?losartan (COZAAR) 50 MG tablet TAKE ONE (1) TABLET BY MOUTH EVERY DAY FOR BLOOD PRESSURE 11/03/21   [name], MD  ?Multiple Vitamins-Minerals (CENTRUM SILVER ADULT 50+) TABS Take 1 capsule by mouth 2 (two) times daily. 01/22/16   Ihor Gully  D, MD  ? ? ?Family History ?Family History  ?Problem Relation Age of Onset  ? Multiple sclerosis Son   ? Bipolar disorder Son   ? ? ?Social History ?Social History  ? ?Tobacco Use  ? Smoking status: Former  ?  Types: Cigarettes  ?  Quit date: 11/21/1985  ?  Years since quitting: 36.3  ? Smokeless tobacco: Never  ?Substance Use Topics  ? Alcohol use: No  ? Drug use: No  ? ? ? ?Allergies   ?Patient has no known allergies. ? ? ?Review of Systems ?Review of Systems  ?Skin:   ?     abscess  ? ? ?Physical Exam ?Triage Vital Signs ?ED Triage Vitals  ?Enc Vitals Group  ?   BP 04/05/22 0924 (!) 161/60  ?   Pulse Rate 04/05/22 0924 66  ?   Resp 04/05/22 0924 18  ?   Temp 04/05/22 0924 98.2 ?F  (36.8 ?C)  ?   Temp src --   ?   SpO2 04/05/22 0924 100 %  ?   Weight --   ?   Height --   ?   Head Circumference --   ?   Peak Flow --   ?   Pain Score 04/05/22 0923 0  ?   Pain Loc --   ?   Pain Edu? --   ?   Excl. in [place]? --   ? ?No data found. ? ?Updated Vital Signs ?BP (!) 161/60   Pulse 66   Temp 98.2 ?F (36.8 ?C)   Resp 18   SpO2 100%  ? ?Visual Acuity ?Right Eye Distance:   ?Left Eye Distance:   ?Bilateral Distance:   ? ?Right Eye Near:   ?Left Eye Near:    ?Bilateral Near:    ? ?Physical Exam ?Vitals reviewed.  ?Constitutional:   ?   General: He is not in acute distress. ?   Appearance: Normal appearance. He is not ill-appearing.  ?HENT:  ?   Head: Normocephalic and atraumatic.  ?Pulmonary:  ?   Effort: Pulmonary effort is normal.  ?Skin: ?   Comments: L axilla with one small 1cm abscess with fluctuance and spontaneous drainage. I elicited copius purulent drainage by lightly pressing on the spot. No surrounding color change induration or warmth.  ?Neurological:  ?   General: No focal deficit present.  ?   Mental Status: He is alert and oriented to person, place, and time.  ?Psychiatric:     ?   Mood and Affect: Mood normal.     ?   Behavior: Behavior normal.     ?   Thought Content: Thought content normal.     ?   Judgment: Judgment normal.  ? ? ? ?UC Treatments / Results  ?Labs ?(all labs ordered are listed, but only abnormal results are displayed) ?Labs Reviewed - No data to display ? ?EKG ? ? ?Radiology ?No results found. ? ?Procedures ?Procedures (including critical care time) ? ?Medications Ordered in UC ?Medications - No data to display ? ?Initial Impression / Assessment and Plan / UC Course  ?I have reviewed the triage vital signs and the nursing notes. ? ?Pertinent labs & imaging results that were available during my care of the patient were reviewed by me and considered in my medical decision making (see chart for details). ? ?  ? ?This patient is a very pleasant 81 y.o. year old male presenting  with folliculitis L axilla. Afebrile, nontachy. There is  only one small abscess and it is spontaneously draining. Reviewed labs from 1 day ago with pt, including kidney function which is normal. Doxycycline sent. Continue warm compresses. ED return precautions discussed. Patient and wife verbalizes understanding and agreement.  ? ? ?Final Clinical Impressions(s) / UC Diagnoses  ? ?Final diagnoses:  ?Folliculitis  ? ? ? ?Discharge Instructions   ? ?  ?-Doxycycline twice daily for 7 days.  Make sure to wear sunscreen while spending time outside while on this medication as it can increase your chance of sunburn. You can take this medication with food if you have a sensitive stomach. ?-Warm compresses ?-Wash with gentle soap and water only ?-Your iron level is slightly low, but the same as your level 9 months ago. Discuss this with your PCP - they may start a low-dose of iron supplementation, or simply keep an eye on it. Your other labs looked good including your kidney function and lipids/triglycerides.  ? ? ? ? ?ED Prescriptions   ? ? Medication Sig Dispense Auth. Provider  ? doxycycline (VIBRAMYCIN) 100 MG capsule Take 1 capsule (100 mg total) by mouth 2 (two) times daily for 7 days. 14 capsule Hazel Sams, PA-C  ? ?  ? ?PDMP not reviewed this encounter. ?  ?Hazel Sams, PA-C ?04/05/22 1029 ? ?

## 2022-04-08 ENCOUNTER — Ambulatory Visit (INDEPENDENT_AMBULATORY_CARE_PROVIDER_SITE_OTHER): Payer: Medicare Other

## 2022-04-08 VITALS — Wt 156.0 lb

## 2022-04-08 DIAGNOSIS — Z0000 Encounter for general adult medical examination without abnormal findings: Secondary | ICD-10-CM

## 2022-04-08 NOTE — Patient Instructions (Signed)
[name] , Thank you for taking time to come for your Medicare Wellness Visit. I appreciate your ongoing commitment to your health goals. Please review the following plan we discussed and let me know if I can assist you in the future.   Screening recommendations/referrals: Colonoscopy: No longer required due to age Recommended yearly ophthalmology/optometry visit for glaucoma screening and checkup Recommended yearly dental visit for hygiene and checkup  Vaccinations: declines Influenza vaccine: Recommended every fall Pneumococcal vaccine: Recommend once per lifetime Prevnar-20 Tdap vaccine: Recommended every 10 years Shingles vaccine: Shingrix is 2 doses 2-6 months apart and over 90% effective     Covid-19: Done 12/06/2019 & 01/03/2020 - for boosters, contact pharmacy  Advanced directives: Advance directive discussed with you today. Even though you declined this today, please call our office should you change your mind, and we can give you the proper paperwork for you to fill out.   Conditions/risks identified: Aim for 30 minutes of exercise or brisk walking, 6-8 glasses of water, and 5 servings of fruits and vegetables each day.   Next appointment: Follow up in one year for your annual wellness visit.   Preventive Care 81 Years and Older, Male  Preventive care refers to lifestyle choices and visits with your health care provider that can promote health and wellness. What does preventive care include? A yearly physical exam. This is also called an annual well check. Dental exams once or twice a year. Routine eye exams. Ask your health care provider how often you should have your eyes checked. Personal lifestyle choices, including: Daily care of your teeth and gums. Regular physical activity. Eating a healthy diet. Avoiding tobacco and drug use. Limiting alcohol use. Practicing safe sex. Taking low doses of aspirin every day. Taking vitamin and mineral supplements as recommended by  your health care provider. What happens during an annual well check? The services and screenings done by your health care provider during your annual well check will depend on your age, overall health, lifestyle risk factors, and family history of disease. Counseling  Your health care provider may ask you questions about your: Alcohol use. Tobacco use. Drug use. Emotional well-being. Home and relationship well-being. Sexual activity. Eating habits. History of falls. Memory and ability to understand (cognition). Work and work Statistician. Screening  You may have the following tests or measurements: Height, weight, and BMI. Blood pressure. Lipid and cholesterol levels. These may be checked every 5 years, or more frequently if you are over 37 years old. Skin check. Lung cancer screening. You may have this screening every year starting at age 81 if you have a 30-pack-year history of smoking and currently smoke or have quit within the past 15 years. Fecal occult blood test (FOBT) of the stool. You may have this test every year starting at age 81. Flexible sigmoidoscopy or colonoscopy. You may have a sigmoidoscopy every 5 years or a colonoscopy every 10 years starting at age 81. Prostate cancer screening. Recommendations will vary depending on your family history and other risks. Hepatitis C blood test. Hepatitis B blood test. Sexually transmitted disease (STD) testing. Diabetes screening. This is done by checking your blood sugar (glucose) after you have not eaten for a while (fasting). You may have this done every 1-3 years. Abdominal aortic aneurysm (AAA) screening. You may need this if you are a current or former smoker. Osteoporosis. You may be screened starting at age 81 if you are at high risk. Talk with your health care provider about your  test results, treatment options, and if necessary, the need for more tests. Vaccines  Your health care provider may recommend certain vaccines,  such as: Influenza vaccine. This is recommended every year. Tetanus, diphtheria, and acellular pertussis (Tdap, Td) vaccine. You may need a Td booster every 10 years. Zoster vaccine. You may need this after age 81. Pneumococcal 13-valent conjugate (PCV13) vaccine. One dose is recommended after age 81. Pneumococcal polysaccharide (PPSV23) vaccine. One dose is recommended after age 8. Talk to your health care provider about which screenings and vaccines you need and how often you need them. This information is not intended to replace advice given to you by your health care provider. Make sure you discuss any questions you have with your health care provider. Document Released: 12/04/2015 Document Revised: 07/27/2016 Document Reviewed: 09/08/2015 Elsevier Interactive Patient Education  2017 [place] Prevention in the Home Falls can cause injuries. They can happen to people of all ages. There are many things you can do to make your home safe and to help prevent falls. What can I do on the outside of my home? Regularly fix the edges of walkways and driveways and fix any cracks. Remove anything that might make you trip as you walk through a door, such as a raised step or threshold. Trim any bushes or trees on the path to your home. Use bright outdoor lighting. Clear any walking paths of anything that might make someone trip, such as rocks or tools. Regularly check to see if handrails are loose or broken. Make sure that both sides of any steps have handrails. Any raised decks and porches should have guardrails on the edges. Have any leaves, snow, or ice cleared regularly. Use sand or salt on walking paths during winter. Clean up any spills in your garage right away. This includes oil or grease spills. What can I do in the bathroom? Use night lights. Install grab bars by the toilet and in the tub and shower. Do not use towel bars as grab bars. Use non-skid mats or decals in the tub or  shower. If you need to sit down in the shower, use a plastic, non-slip stool. Keep the floor dry. Clean up any water that spills on the floor as soon as it happens. Remove soap buildup in the tub or shower regularly. Attach bath mats securely with double-sided non-slip rug tape. Do not have throw rugs and other things on the floor that can make you trip. What can I do in the bedroom? Use night lights. Make sure that you have a light by your bed that is easy to reach. Do not use any sheets or blankets that are too big for your bed. They should not hang down onto the floor. Have a firm chair that has side arms. You can use this for support while you get dressed. Do not have throw rugs and other things on the floor that can make you trip. What can I do in the kitchen? Clean up any spills right away. Avoid walking on wet floors. Keep items that you use a lot in easy-to-reach places. If you need to reach something above you, use a strong step stool that has a grab bar. Keep electrical cords out of the way. Do not use floor polish or wax that makes floors slippery. If you must use wax, use non-skid floor wax. Do not have throw rugs and other things on the floor that can make you trip. What can I do with my  stairs? Do not leave any items on the stairs. Make sure that there are handrails on both sides of the stairs and use them. Fix handrails that are broken or loose. Make sure that handrails are as long as the stairways. Check any carpeting to make sure that it is firmly attached to the stairs. Fix any carpet that is loose or worn. Avoid having throw rugs at the top or bottom of the stairs. If you do have throw rugs, attach them to the floor with carpet tape. Make sure that you have a light switch at the top of the stairs and the bottom of the stairs. If you do not have them, ask someone to add them for you. What else can I do to help prevent falls? Wear shoes that: Do not have high heels. Have  rubber bottoms. Are comfortable and fit you well. Are closed at the toe. Do not wear sandals. If you use a stepladder: Make sure that it is fully opened. Do not climb a closed stepladder. Make sure that both sides of the stepladder are locked into place. Ask someone to hold it for you, if possible. Clearly mark and make sure that you can see: Any grab bars or handrails. First and last steps. Where the edge of each step is. Use tools that help you move around (mobility aids) if they are needed. These include: Canes. Walkers. Scooters. Crutches. Turn on the lights when you go into a dark area. Replace any light bulbs as soon as they burn out. Set up your furniture so you have a clear path. Avoid moving your furniture around. If any of your floors are uneven, fix them. If there are any pets around you, be aware of where they are. Review your medicines with your doctor. Some medicines can make you feel dizzy. This can increase your chance of falling. Ask your doctor what other things that you can do to help prevent falls. This information is not intended to replace advice given to you by your health care provider. Make sure you discuss any questions you have with your health care provider. Document Released: 09/03/2009 Document Revised: 04/14/2016 Document Reviewed: 12/12/2014 Elsevier Interactive Patient Education  2017 [employer].

## 2022-04-08 NOTE — Progress Notes (Signed)
Subjective:   Devin Valdez is a 81 y.o. male who presents for Medicare Annual/Subsequent preventive examination.  Virtual Visit via Telephone Note  I connected with  Devin Valdez on 04/08/22 at 10:45 AM EDT by telephone and verified that I am speaking with the correct person using two identifiers.  Location: Patient: Home Provider: [facility] Persons participating in the virtual visit: patient/Nurse Health Advisor   I discussed the limitations, risks, security and privacy concerns of performing an evaluation and management service by telephone and the availability of in person appointments. The patient expressed understanding and agreed to proceed.  Interactive audio and video telecommunications were attempted between this nurse and patient, however failed, due to patient having technical difficulties OR patient did not have access to video capability.  We continued and completed visit with audio only.  Some vital signs may be absent or patient reported.   Devin Maese E Lorene Klimas, LPN   Review of Systems     Cardiac Risk Factors include: advanced age (>22mn, >>33women);sedentary lifestyle;hypertension;male gender;Other (see comment), Risk factor comments: hx of stroke, anemia     Objective:    Today's Vitals   04/08/22 1102 04/08/22 1103  Weight: 156 lb (70.8 kg)   PainSc:  0-No pain   Body mass index is 24.43 kg/m.     04/08/2022   11:09 AM 06/12/2020    1:28 PM 01/23/2020    9:37 AM 01/23/2020    9:26 AM 12/27/2019    1:21 PM 12/21/2019   12:00 AM 12/20/2019    9:04 PM  Advanced Directives  Does Patient Have a Medical Advance Directive? No No No No No No No  Would patient like information on creating a medical advance directive? No - Patient declined No - Patient declined   Yes (MAU/Ambulatory/Procedural Areas - Information given) No - Patient declined No - Patient declined    Current Medications (verified) Outpatient Encounter Medications as of 04/08/2022  Medication Sig    acetaminophen (TYLENOL) 325 MG tablet Take 650 mg by mouth every 6 (six) hours as needed for mild pain or headache.   Ascorbic Acid (VITAMIN C) 1000 MG tablet Take 1,000 mg by mouth daily.   aspirin 81 MG chewable tablet Chew by mouth daily.   atorvastatin (LIPITOR) 40 MG tablet TAKE ONE TABLET ('40MG'$  TOTAL) BY MOUTH DAILY AT 6PM   Coenzyme Q10 (CO Q-10) 200 MG CAPS Take by mouth daily.   doxycycline (VIBRAMYCIN) 100 MG capsule Take 1 capsule (100 mg total) by mouth 2 (two) times daily for 7 days.   losartan (COZAAR) 50 MG tablet TAKE ONE (1) TABLET BY MOUTH EVERY DAY FOR BLOOD PRESSURE   Multiple Vitamins-Minerals (CENTRUM SILVER ADULT 50+) TABS Take 1 capsule by mouth 2 (two) times daily.   ferrous sulfate 325 (65 FE) MG tablet Take 1 tablet (325 mg total) by mouth 2 (two) times daily with a meal. (Patient not taking: Reported on 04/08/2022)   No facility-administered encounter medications on file as of 04/08/2022.    Allergies (verified) Patient has no known allergies.   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 Brigitte P[facility] History  Problem Relation Age of Onset   Multiple sclerosis Son    Bipolar disorder Son    Social History   Socioeconomic History   Marital status: Married    Spouse name: Bethena Roys   Number of children: Not on file   Years of education: Not on file   Highest education level: Not on file  Occupational History   Occupation: retired  Tobacco Use   Smoking status: Former    Types: Cigarettes    Quit date: 11/21/1985    Years since quitting: 36.4   Smokeless tobacco: Never   Substance and Sexual Activity   Alcohol use: No   Drug use: No   Sexual activity: Yes  Other Topics Concern   Not on file  Social History Narrative   Not on file   Social Determinants of Health   Financial Resource Strain: Low Risk    Difficulty of Paying Living Expenses: Not hard at all  Food Insecurity: No Food Insecurity   Worried About Charity fundraiser in the Last Year: Never true   [place] in the Last Year: Never true  Transportation Needs: No Transportation Needs   Lack of Transportation (Medical): No   Lack of Transportation (Non-Medical): No  Physical Activity: Insufficiently Active   Days of Exercise per Week: 7 days   Minutes of Exercise per Session: 20 min  Stress: No Stress Concern Present   Feeling of Stress : Not at all  Social Connections: Socially Integrated   Frequency of Communication with Friends and Family: More than three times a week   Frequency of Social Gatherings with Friends and Family: More than three times a week   Attends Religious Services: More than 4 times per year   Active Member of [employer] or Organizations: Yes   Attends Music therapist: More than 4 times per year   Marital Status: Married    Tobacco Counseling Counseling given: Not Answered   Clinical Intake:  Pre-visit preparation completed: Yes  Pain : No/denies pain Pain Score: 0-No pain     BMI - recorded: 24.43 Nutritional Status: BMI of 19-24  Normal Nutritional Risks: None Diabetes: No  How often do you need to have someone help you when you read instructions, pamphlets, or other written materials from your doctor or pharmacy?: 1 - Never  Diabetic? no  Interpreter Needed?: No  Information entered by :: Dannya Pitkin, LPN   Activities of Daily Living    04/08/2022   11:09 AM  In your present state of health, do you have any difficulty performing the following activities:  Hearing? 0  Vision? 0  Difficulty concentrating or making  decisions? 0  Walking or climbing stairs? 0  Dressing or bathing? 0  Doing errands, shopping? 0  Preparing Food and eating ? N  Using the Toilet? N  In the past six months, have you accidently leaked urine? N  Do you have problems with loss of bowel control? N  Managing your Medications? N  Managing your Finances? N  Housekeeping or managing your Housekeeping? N    Patient Care Team: [name], MD as PCP - General (Family Medicine) Edythe Clarity, [facility] as Pharmacist (Pharmacist) Zadie Rhine Clent Demark, MD as Consulting Physician (Ophthalmology) Warden Fillers, MD as Consulting Physician (Ophthalmology)  Indicate any recent Medical Services you [phone]  have received from other than [facility] providers in the past year (date may be approximate).     Assessment:   This is a routine wellness examination for Gerrad.  Hearing/Vision screen Hearing Screening - Comments:: Denies hearing difficulties   Vision Screening - Comments:: Wears rx glasses - up to date with routine eye exams with [facility]  Dietary issues and exercise activities discussed: Current Exercise Habits: Home exercise routine, Type of exercise: walking, Time (Minutes): 15, Frequency (Times/Week): 7, Weekly Exercise (Minutes/Week): 105, Intensity: Mild, Exercise limited by: neurologic condition(s)   Goals Addressed             This Visit's Progress    Prevent falls       Use a cane when walking on uneven surfaces, try chair exercises to strengthen your legs       Depression Screen    04/08/2022   11:08 AM 04/07/2021   12:58 PM 05/09/2019    8:16 AM 06/12/2018    9:47 AM  PHQ 2/9 Scores  PHQ - 2 Score 0 0 2 0  PHQ- 9 Score   3     Fall Risk    04/08/2022   11:05 AM 04/07/2021   12:58 PM 12/18/2020    2:06 PM 05/09/2019    8:16 AM 06/12/2018    9:47 AM  Fall Risk   Falls in the past year? 0 0 0 0 No  Number falls in past yr: 0 0 0    Injury with Fall? 0 0 0    Risk for fall due to : Other  (Comment);Impaired balance/gait  No Fall Risks    Risk for fall due to: Comment hx of stroke      Follow up Falls prevention discussed  Falls evaluation completed Falls evaluation completed     FALL RISK PREVENTION PERTAINING TO THE HOME:  Any stairs in or around the home? Yes  If so, are there any without handrails? No  Home free of loose throw rugs in walkways, pet beds, electrical cords, etc? Yes  Adequate lighting in your home to reduce risk of falls? Yes   ASSISTIVE DEVICES UTILIZED TO PREVENT FALLS:  Life alert? No  Use of a cane, walker or w/c?  Cane prn only Grab bars in the bathroom? Yes  Shower chair or bench in shower? Yes  Elevated toilet seat or a handicapped toilet? Yes   TIMED UP AND GO:  Was the test performed? No . Telephonic visit  Cognitive Function:        04/08/2022   11:12 AM 04/07/2021   12:59 PM  6CIT Screen  What Year? 0 points 0 points  What month? 0 points 0 points  What time? 0 points 0 points  Count back from 20 0 points 0 points  Months in reverse 0 points 0 points  Repeat phrase 0 points 0 points  Total Score 0 points 0 points    Immunizations Immunization History  Administered Date(s) Administered   Fluad Quad(high Dose 65+) 08/06/2019   Moderna SARS-COV2 Booster Vaccination 01/03/2020   Moderna Sars-Covid-2 Vaccination 12/06/2019    TDAP status: Due, Education has been provided regarding the importance of this vaccine. Advised may receive this vaccine at local pharmacy or Health Dept. Aware to provide a copy of the vaccination record if obtained from local pharmacy or Health Dept. Verbalized acceptance and understanding.  Flu Vaccine status: Declined, Education has been provided regarding the importance of this vaccine but patient still declined. Advised  may receive this vaccine at local pharmacy or Health Dept. Aware to provide a copy of the vaccination record if obtained from local pharmacy or Health Dept. Verbalized acceptance and  understanding.  Pneumococcal vaccine status: Declined,  Education has been provided regarding the importance of this vaccine but patient still declined. Advised may receive this vaccine at local pharmacy or Health Dept. Aware to provide a copy of the vaccination record if obtained from local pharmacy or Health Dept. Verbalized acceptance and understanding.   Covid-19 vaccine status: Completed vaccines  Qualifies for Shingles Vaccine? Yes   Zostavax completed No   Shingrix Completed?: No.    Education has been provided regarding the importance of this vaccine. Patient has been advised to call insurance company to determine out of pocket expense if they have not yet received this vaccine. Advised may also receive vaccine at local pharmacy or Health Dept. Verbalized acceptance and understanding.  Screening Tests Health Maintenance  Topic Date Due   Pneumonia Vaccine 35+ Years old (1 - PCV) Never done   TETANUS/TDAP  Never done   Zoster Vaccines- Shingrix (1 of 2) Never done   COVID-19 Vaccine (2 - Moderna series) 01/31/2020   INFLUENZA VACCINE  06/21/2022   HPV VACCINES  Aged Out    Health Maintenance  Health Maintenance Due  Topic Date Due   Pneumonia Vaccine 23+ Years old (1 - PCV) Never done   TETANUS/TDAP  Never done   Zoster Vaccines- Shingrix (1 of 2) Never done   COVID-19 Vaccine (2 - Moderna series) 01/31/2020    Colorectal cancer screening: No longer required.   Lung Cancer Screening: (Low Dose CT Chest recommended if Age 44-80 years, 30 pack-year currently smoking OR have quit w/in 15years.) does not qualify.   Additional Screening:  Hepatitis C Screening: does not qualify  Vision Screening: Recommended annual ophthalmology exams for early detection of glaucoma and other disorders of the eye. Is the patient up to date with their annual eye exam?  Yes  Who is the provider or what is the name of the office in which the patient attends annual eye exams? [facility] If pt is not established with a provider, would they like to be referred to a provider to establish care? No .   Dental Screening: Recommended annual dental exams for proper oral hygiene  Community Resource Referral / Chronic Care Management: CRR required this visit?  No   CCM required this visit?  No      Plan:     I have personally reviewed and noted the following in the patient's chart:   Medical and social history Use of alcohol, tobacco or illicit drugs  Current medications and supplements including opioid prescriptions. Patient is not currently taking opioid prescriptions. Functional ability and status Nutritional status Physical activity Advanced directives List of other physicians Hospitalizations, surgeries, and ER visits in previous 12 months Vitals Screenings to include cognitive, depression, and falls Referrals and appointments  In addition, I have reviewed and discussed with patient certain preventive protocols, quality metrics, and best practice recommendations. A written personalized care plan for preventive services as well as general preventive health recommendations were provided to patient.     Sandrea Hammond, LPN   9/89/2119   Nurse Notes: None

## 2022-04-24 ENCOUNTER — Encounter (HOSPITAL_COMMUNITY): Payer: Self-pay | Admitting: Emergency Medicine

## 2022-04-24 ENCOUNTER — Other Ambulatory Visit: Payer: Self-pay

## 2022-04-24 ENCOUNTER — Emergency Department (HOSPITAL_COMMUNITY)
Admission: EM | Admit: 2022-04-24 | Discharge: 2022-04-24 | Disposition: A | Discharge status: Home / Self Care | Payer: Medicare Other | Attending: Emergency Medicine | Admitting: Emergency Medicine

## 2022-04-24 ENCOUNTER — Emergency Department (HOSPITAL_COMMUNITY): Payer: Medicare Other

## 2022-04-24 DIAGNOSIS — R1111 Vomiting without nausea: Secondary | ICD-10-CM | POA: Diagnosis not present

## 2022-04-24 DIAGNOSIS — I10 Essential (primary) hypertension: Secondary | ICD-10-CM | POA: Insufficient documentation

## 2022-04-24 DIAGNOSIS — Z79899 Other long term (current) drug therapy: Secondary | ICD-10-CM | POA: Diagnosis not present

## 2022-04-24 DIAGNOSIS — R55 Syncope and collapse: Secondary | ICD-10-CM | POA: Diagnosis not present

## 2022-04-24 DIAGNOSIS — Z7982 Long term (current) use of aspirin: Secondary | ICD-10-CM | POA: Insufficient documentation

## 2022-04-24 DIAGNOSIS — R42 Dizziness and giddiness: Secondary | ICD-10-CM | POA: Diagnosis not present

## 2022-04-24 DIAGNOSIS — R739 Hyperglycemia, unspecified: Secondary | ICD-10-CM | POA: Insufficient documentation

## 2022-04-24 DIAGNOSIS — D649 Anemia, unspecified: Secondary | ICD-10-CM | POA: Diagnosis not present

## 2022-04-24 LAB — CBC
HCT: 34.3 % — ABNORMAL LOW (ref 39.0–52.0)
Hemoglobin: 10.8 g/dL — ABNORMAL LOW (ref 13.0–17.0)
MCH: 28.8 pg (ref 26.0–34.0)
MCHC: 31.5 g/dL (ref 30.0–36.0)
MCV: 91.5 fL (ref 80.0–100.0)
Platelets: 174 10*3/uL (ref 150–400)
RBC: 3.75 MIL/uL — ABNORMAL LOW (ref 4.22–5.81)
RDW: 12.4 % (ref 11.5–15.5)
WBC: 5.9 10*3/uL (ref 4.0–10.5)
nRBC: 0 % (ref 0.0–0.2)

## 2022-04-24 LAB — BASIC METABOLIC PANEL
Anion gap: 6 (ref 5–15)
BUN: 22 mg/dL (ref 8–23)
CO2: 26 mmol/L (ref 22–32)
Calcium: 10 mg/dL (ref 8.9–10.3)
Chloride: 107 mmol/L (ref 98–111)
Creatinine, Ser: 1.23 mg/dL (ref 0.61–1.24)
GFR, Estimated: 59 mL/min — ABNORMAL LOW (ref >60)
Glucose, Bld: 119 mg/dL — ABNORMAL HIGH (ref 70–99)
Potassium: 4.2 mmol/L (ref 3.5–5.1)
Sodium: 139 mmol/L (ref 135–145)

## 2022-04-24 LAB — URINALYSIS, ROUTINE W REFLEX MICROSCOPIC
Bilirubin Urine: NEGATIVE
Glucose, UA: NEGATIVE mg/dL
Hgb urine dipstick: NEGATIVE
Ketones, ur: NEGATIVE mg/dL
Leukocytes,Ua: NEGATIVE
Nitrite: NEGATIVE
Protein, ur: NEGATIVE mg/dL
Specific Gravity, Urine: 1.02 (ref 1.005–1.030)
pH: 5 (ref 5.0–8.0)

## 2022-04-24 LAB — TROPONIN I (HIGH SENSITIVITY)
Troponin I (High Sensitivity): 4 ng/L (ref <18)
Troponin I (High Sensitivity): 4 ng/L (ref <18)

## 2022-04-24 LAB — CBG MONITORING, ED: Glucose-Capillary: 116 mg/dL — ABNORMAL HIGH (ref 70–99)

## 2022-04-24 MED ORDER — SODIUM CHLORIDE 0.9 % IV BOLUS
500.0000 mL | Freq: Once | INTRAVENOUS | Status: AC
Start: 2022-04-24 — End: 2022-04-24
  Administered 2022-04-24: 500 mL via INTRAVENOUS

## 2022-04-24 NOTE — ED Notes (Signed)
Patient educated on need for urine sample. Patient stated he was unable to use the restroom.

## 2022-04-24 NOTE — ED Provider Notes (Signed)
[facility] EMERGENCY DEPARTMENT Provider Note   CSN: [id number] Arrival date & time: 04/24/22  1215     History  Chief Complaint  Patient presents with   Near Syncope    Devin Valdez is a 81 y.o. male.  HPI Patient is an 81 year old male with a history of CVA, hypertension, frequent falls, gait instability, cervical myelopathy, who presents to the emergency department due to a near syncopal episode today at church.  Patient states he was standing and singing in the choir.  He states he began feeling a warm sensation and became lightheaded.  Other members of the church were able to keep him standing and walked him outside.  He had an episode of nausea and vomiting and his symptoms resolved shortly thereafter.  Denies any syncope or falls.  Denies head trauma.  Denies any chest pain or shortness of breath before, during, or since the incident.  Denies visual changes, weakness, numbness.    Home Medications Prior to Admission medications   Medication Sig Start Date End Date Taking? Authorizing Provider  acetaminophen (TYLENOL) 325 MG tablet Take 650 mg by mouth every 6 (six) hours as needed for mild pain or headache.    [provider]  Ascorbic Acid (VITAMIN C) 1000 MG tablet Take 1,000 mg by mouth daily.    [provider]  aspirin 81 MG chewable tablet Chew by mouth daily.    [provider]  atorvastatin (LIPITOR) 40 MG tablet TAKE ONE TABLET ('40MG'$  TOTAL) BY MOUTH DAILY AT 6PM 03/08/22   [name], MD  Coenzyme Q10 (CO Q-10) 200 MG CAPS Take by mouth daily.    [provider]  ferrous sulfate 325 (65 FE) MG tablet Take 1 tablet (325 mg total) by mouth 2 (two) times daily with a meal. Patient not taking: Reported on 04/08/2022 05/28/17   Theodis Blaze, MD  losartan (COZAAR) 50 MG tablet TAKE ONE (1) TABLET BY MOUTH EVERY DAY FOR BLOOD PRESSURE 11/03/21   [name], MD  Multiple Vitamins-Minerals (CENTRUM SILVER ADULT 50+) TABS  Take 1 capsule by mouth 2 (two) times daily. 01/22/16   Billy Fischer, MD      Allergies    Patient has no known allergies.    Review of Systems   Review of Systems  All other systems reviewed and are negative. Ten systems reviewed and are negative for acute change, except as noted in the HPI.   Physical Exam Updated Vital Signs BP (!) 178/70   Pulse 61   Temp 98.2 F (36.8 C) (Oral)   Resp 18   Ht '5\' 7"'$  (1.702 m)   Wt 70.8 kg   SpO2 99%   BMI 24.43 kg/m  Physical Exam Vitals and nursing note reviewed.  Constitutional:      General: He is not in acute distress.    Appearance: Normal appearance. He is not ill-appearing, toxic-appearing or diaphoretic.  HENT:     Head: Normocephalic and atraumatic.     Right Ear: External ear normal.     Left Ear: External ear normal.     Nose: Nose normal.     Mouth/Throat:     Mouth: Mucous membranes are moist.     Pharynx: Oropharynx is clear. No oropharyngeal exudate or posterior oropharyngeal erythema.  Eyes:     General: No scleral icterus.       Right eye: No discharge.        Left eye: No discharge.  Extraocular Movements: Extraocular movements intact.     Conjunctiva/sclera: Conjunctivae normal.     Pupils: Pupils are equal, round, and reactive to light.     Comments: PERRL. EOMI.  Cardiovascular:     Rate and Rhythm: Normal rate and regular rhythm.     Pulses: Normal pulses.     Heart sounds: Normal heart sounds. No murmur heard.   No friction rub. No gallop.     Comments: RRR without M/R/G. Pulmonary:     Effort: Pulmonary effort is normal. No respiratory distress.     Breath sounds: Normal breath sounds. No stridor. No wheezing, rhonchi or rales.     Comments: LCTAB. Abdominal:     General: Abdomen is flat.     Palpations: Abdomen is soft.     Tenderness: There is no abdominal tenderness.  Musculoskeletal:        General: Normal range of motion.     Cervical back: Normal range of motion and neck supple. No  tenderness.  Skin:    General: Skin is warm and dry.  Neurological:     General: No focal deficit present.     Mental Status: He is alert and oriented to person, place, and time.     Comments: Patient is oriented to person, place, and time. Patient phonates in clear, complete, and coherent sentences. Negative arm drift. Finger to nose intact bilaterally with no visible signs of dysmetria. Strength is 5/5 in all four extremities. Distal sensation intact in all four extremities.  Psychiatric:        Mood and Affect: Mood normal.        Behavior: Behavior normal.   ED Results / Procedures / Treatments   Labs (all labs ordered are listed, but only abnormal results are displayed) Labs Reviewed  BASIC METABOLIC PANEL - Abnormal; Notable for the following components:      Result Value   Glucose, Bld 119 (*)    GFR, Estimated 59 (*)    All other components within normal limits  CBC - Abnormal; Notable for the following components:   RBC 3.75 (*)    Hemoglobin 10.8 (*)    HCT 34.3 (*)    All other components within normal limits  CBG MONITORING, ED - Abnormal; Notable for the following components:   Glucose-Capillary 116 (*)    All other components within normal limits  URINALYSIS, ROUTINE W REFLEX MICROSCOPIC  TROPONIN I (HIGH SENSITIVITY)  TROPONIN I (HIGH SENSITIVITY)   EKG EKG Interpretation  Date/Time:  Sunday April 24 2022 12:25:53 EDT Ventricular Rate:  66 PR Interval:  162 QRS Duration: 92 QT Interval:  397 QTC Calculation: 416 R Axis:   22 Text Interpretation: Sinus rhythm Nonspecific ST abnormality Baseline wander Confirmed by Lajean Saver [id number]) on 04/24/2022 4:55:50 PM  Radiology DG Chest Portable 1 View  Result Date: 04/24/2022 CLINICAL DATA:  Syncope EXAM: PORTABLE CHEST 1 VIEW COMPARISON:  01/13/2020 FINDINGS: Cardiac size is within normal limits. There are no signs of pulmonary edema. Small linear densities seen in the left lower lung fields. There is no focal  pulmonary consolidation. There is no pleural effusion or pneumothorax. Degenerative changes are noted in both shoulders. IMPRESSION: There are no signs of pulmonary edema or focal pulmonary consolidation. Small linear densities in the left lower lung fields suggest scarring or subsegmental atelectasis. Electronically Signed   By: Elmer Picker M.D.   On: 04/24/2022 14:24    Procedures Procedures   Medications Ordered in ED Medications  sodium  chloride 0.9 % bolus 500 mL (0 mLs Intravenous Stopped 04/24/22 1611)   ED Course/ Medical Decision Making/ A&P                           Medical Decision Making Amount and/or Complexity of Data Reviewed Labs: ordered. Radiology: ordered.   Pt is a 81 y.o. male with a medical history as noted above who presents to the emergency department due to a near syncopal episode that occurred earlier today while standing in charge.  Labs: CBC with a hemoglobin of 10.8 and hematocrit of 34.3. BMP with a glucose of 119 and a GFR 59. CBG of 116. UA without abnormalities. Troponin of 4 with a repeat of 4.  Imaging: Chest x-ray shows no signs of pulmonary edema or focal pulmonary consolidation.  Small linear densities in the left lower lung field suggestive of scarring or subsegmental atelectasis.  I, Rayna Sexton, PA-C, personally reviewed and evaluated these images and lab results as part of my medical decision-making.  Since arriving to the emergency department patient denies any somatic complaints.  No lightheadedness, nausea, vomiting.  Patient denies completely losing consciousness, falling, chest pain, shortness of breath, or any trauma during the event.  On my exam heart is regular rate and rhythm without murmurs, rubs, or gallops.  Lungs are clear to auscultation bilaterally.  Neurological exam appears benign.  I obtained a basic cardiac work-up with abnormalities as noted above.  Appears generally reassuring.  Hemoglobin appears stable around  10.8.  Flat troponins at 4.  ECG does not appear ischemic.  Chest x-ray shows scarring versus subsegmental atelectasis but is otherwise reassuring.  Doubt cardiogenic cause of his near syncope at this time.  Sounds consistent with possible orthostasis.  Patient treated with 500 cc of IV fluids.  He was able to ambulate without difficulty.  He is eager to be discharged.  Feel that this is reasonable.  We discussed return precautions.  Recommended PCP follow-up.  His questions were answered and he was amicable at the time of discharge.  Patient discussed with and evaluated by my attending physician Dr. Milton Ferguson who is in agreement with the above plan.  Note: Portions of this report may have been transcribed using voice recognition software. Every effort was made to ensure accuracy; however, inadvertent computerized transcription errors may be present.   Final Clinical Impression(s) / ED Diagnoses Final diagnoses:  Near syncope   Rx / DC Orders ED Discharge Orders     None         Rayna Sexton, PA-C 04/24/22 1658    Milton Ferguson, MD 04/26/22 0900

## 2022-04-24 NOTE — ED Triage Notes (Signed)
Pt to the ED with complaints of a syncopal episode at church. Pt had a brief episode of NV. EMS reports a negative stroke screen.

## 2022-04-24 NOTE — Discharge Instructions (Signed)
Like we discussed, please continue to monitor your symptoms closely and return to the emergency department with any new or worsening symptoms.  Please follow-up with your primary care provider regarding your symptoms as well as this visit today.

## 2022-04-24 NOTE — ED Notes (Signed)
Lab called at this time for update on troponin.

## 2022-05-02 ENCOUNTER — Ambulatory Visit (INDEPENDENT_AMBULATORY_CARE_PROVIDER_SITE_OTHER): Payer: Medicare Other | Admitting: Family Medicine

## 2022-05-02 VITALS — BP 132/70 | HR 75 | Temp 98.3°F | Ht 67.0 in | Wt 166.4 lb

## 2022-05-02 DIAGNOSIS — D649 Anemia, unspecified: Secondary | ICD-10-CM | POA: Diagnosis not present

## 2022-05-02 DIAGNOSIS — R55 Syncope and collapse: Secondary | ICD-10-CM | POA: Diagnosis not present

## 2022-05-02 NOTE — Progress Notes (Signed)
Subjective:    Patient ID: Devin Valdez, male    DOB: Jun 19, 1941, 81 y.o.   MRN: [id number]  HPI  Patient recently went to ER for near syncope.  I copied ER MD note for historical reference:  Patent had near syncopal episode today at church.  Patient states he was standing and singing in the choir.  He states he began feeling a warm sensation and became lightheaded.  Other members of the church were able to keep him standing and walked him outside.  He had an episode of nausea and vomiting and his symptoms resolved shortly thereafter.  Denies any syncope.  Work up in [place] showed Hgb 10.8.  Thought to be orthostatic.  Given IVF.  Baseline hgb 11.3-11.8.  Here for follow up.  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 Brigitte Pulse at [facility]   Current Outpatient Medications on File Prior to Visit  Medication Sig Dispense Refill   acetaminophen (TYLENOL) 325 MG tablet Take 650 mg by mouth every 6 (six) hours as needed for mild pain or headache.     Ascorbic Acid (VITAMIN C) 1000 MG tablet Take 1,000 mg by mouth daily.     aspirin 81 MG chewable tablet Chew by mouth daily.     atorvastatin (LIPITOR) 40 MG tablet TAKE ONE TABLET ('40MG'$  TOTAL) BY MOUTH DAILY AT 6PM 90 tablet 1   Coenzyme Q10 (CO Q-10) 200 MG CAPS Take by mouth daily.     ferrous sulfate 325 (65 FE) MG tablet Take 1 tablet (325 mg total) by mouth 2 (two) times daily with a meal. (Patient not taking: Reported on 04/08/2022) 60 tablet 1   losartan (COZAAR) 50 MG tablet TAKE ONE (1) TABLET BY MOUTH EVERY DAY FOR BLOOD PRESSURE 90 tablet 3   Multiple Vitamins-Minerals (CENTRUM  SILVER ADULT 50+) TABS Take 1 capsule by mouth 2 (two) times daily. 30 tablet 2   No current facility-administered medications on file prior to visit.   No Known Allergies Social History   Socioeconomic History   Marital status: Married    Spouse name: Bethena Roys   Number of children: Not on file   Years of education: Not on file   Highest education level: Not on file  Occupational History   Occupation: retired  Tobacco Use   Smoking status: Former    Types: Cigarettes    Quit date: 11/21/1985    Years since quitting: 36.4   Smokeless tobacco: Never  Substance and Sexual Activity   Alcohol use: No   Drug use: No   Sexual activity: Yes  Other Topics Concern   Not on file  Social History Narrative   Not on file   Social Determinants of Health   Financial Resource Strain: Low Risk  (04/08/2022)   Overall Financial Resource Strain (CARDIA)    Difficulty of Paying Living Expenses: Not hard at all  Food Insecurity: No Food Insecurity (04/08/2022)   Hunger Vital Sign    Worried About Running Out of Food  in the Last Year: Never true    [place] in the Last Year: Never true  Transportation Needs: No Transportation Needs (04/08/2022)   PRAPARE - Hydrologist (Medical): No    Lack of Transportation (Non-Medical): No  Physical Activity: Insufficiently Active (04/08/2022)   Exercise Vital Sign    Days of Exercise per Week: 7 days    Minutes of Exercise per Session: 20 min  Stress: No Stress Concern Present (04/08/2022)   [place]    Feeling of Stress : Not at all  Social Connections: [place] (04/08/2022)   Social Connection and Isolation Panel [NHANES]    Frequency of Communication with Friends and Family: More than three times a week    Frequency of Social Gatherings with Friends and Family: More than three times a week    Attends Religious Services: More than 4 times per  year    Active Member of [employer] or Organizations: Yes    Attends Archivist Meetings: More than 4 times per year    Marital Status: Married  Human resources officer Violence: Not At Risk (04/08/2022)   Humiliation, Afraid, Rape, and Kick questionnaire    Fear of Current or Ex-Partner: No    Emotionally Abused: No    Physically Abused: No    Sexually Abused: No    Review of Systems  All other systems reviewed and are negative.      Objective:   Physical Exam Constitutional:      Appearance: Normal appearance. He is normal weight.  Cardiovascular:     Rate and Rhythm: Normal rate and regular rhythm. Frequent Extrasystoles are present.    Heart sounds: Normal heart sounds. No murmur heard.    No friction rub. No gallop.  Pulmonary:     Effort: Pulmonary effort is normal. No respiratory distress.     Breath sounds: Normal breath sounds. No stridor. No wheezing, rhonchi or rales.  Chest:     Chest wall: No tenderness.  Abdominal:     General: Abdomen is flat. Bowel sounds are normal.     Palpations: Abdomen is soft.  Musculoskeletal:     Right lower leg: No edema.     Left lower leg: No edema.  Neurological:     General: No focal deficit present.     Mental Status: He is alert and oriented to person, place, and time. Mental status is at baseline.     Cranial Nerves: No cranial nerve deficit.     Sensory: Sensation is intact. No sensory deficit.     Motor: No weakness or seizure activity.     Coordination: Coordination is intact. Coordination normal.     Gait: Gait normal.     Deep Tendon Reflexes: Reflexes are normal and symmetric.           Assessment & Plan:  Near syncope - Plan: CBC with Differential/Platelet, Iron, Vitamin B12, Ferritin, Fecal Globin By Immunochemistry  Anemia, unspecified type - Plan: CBC with Differential/Platelet, Iron, Vitamin B12, Ferritin, Fecal Globin By Immunochemistry  Sounds orthostatic vs vasovagal.  Will work up drop in hemoglobin.   Echo in 2021 showed EF 60% with normal valves.  Doubt cardiac arrhythmia based on history.  However patient's wife would like to speak with a cardiologist about him wearing the monitor to rule out cardiac arrhythmia.  I will schedule this for them.  However based on history I believe he most  likely had a vasovagal episode.  We discussed what to do if this happens again.  I recommended lying down immediately and elevating the legs to prevent passing out

## 2022-05-03 DIAGNOSIS — R55 Syncope and collapse: Secondary | ICD-10-CM | POA: Diagnosis not present

## 2022-05-03 DIAGNOSIS — D649 Anemia, unspecified: Secondary | ICD-10-CM | POA: Diagnosis not present

## 2022-05-03 LAB — CBC WITH DIFFERENTIAL/PLATELET
Absolute Monocytes: 660 cells/uL (ref 200–950)
Basophils Absolute: 10 cells/uL (ref 0–200)
Basophils Relative: 0.2 %
Eosinophils Absolute: 110 cells/uL (ref 15–500)
Eosinophils Relative: 2.2 %
HCT: 32.9 % — ABNORMAL LOW (ref 38.5–50.0)
Hemoglobin: 10.9 g/dL — ABNORMAL LOW (ref 13.2–17.1)
Lymphs Abs: 1040 cells/uL (ref 850–3900)
MCH: 28.9 pg (ref 27.0–33.0)
MCHC: 33.1 g/dL (ref 32.0–36.0)
MCV: 87.3 fL (ref 80.0–100.0)
MPV: 10.8 fL (ref 7.5–12.5)
Monocytes Relative: 13.2 %
Neutro Abs: 3180 cells/uL (ref 1500–7800)
Neutrophils Relative %: 63.6 %
Platelets: 204 10*3/uL (ref 140–400)
RBC: 3.77 10*6/uL — ABNORMAL LOW (ref 4.20–5.80)
RDW: 11.8 % (ref 11.0–15.0)
Total Lymphocyte: 20.8 %
WBC: 5 10*3/uL (ref 3.8–10.8)

## 2022-05-03 LAB — FERRITIN: Ferritin: 543 ng/mL — ABNORMAL HIGH (ref 24–380)

## 2022-05-03 LAB — IRON: Iron: 84 ug/dL (ref 50–180)

## 2022-05-03 LAB — VITAMIN B12: Vitamin B-12: 1457 pg/mL — ABNORMAL HIGH (ref 200–1100)

## 2022-05-04 LAB — FECAL GLOBIN BY IMMUNOCHEMISTRY
FECAL GLOBIN RESULT:: NOT DETECTED
MICRO NUMBER:: 13519203
SPECIMEN QUALITY:: ADEQUATE

## 2022-05-11 ENCOUNTER — Encounter: Payer: Self-pay | Admitting: Cardiology

## 2022-05-11 ENCOUNTER — Ambulatory Visit (INDEPENDENT_AMBULATORY_CARE_PROVIDER_SITE_OTHER): Payer: Medicare Other

## 2022-05-11 ENCOUNTER — Ambulatory Visit (INDEPENDENT_AMBULATORY_CARE_PROVIDER_SITE_OTHER): Payer: Medicare Other | Admitting: Cardiology

## 2022-05-11 VITALS — BP 122/76 | HR 65 | Ht 66.0 in | Wt 155.0 lb

## 2022-05-11 DIAGNOSIS — I493 Ventricular premature depolarization: Secondary | ICD-10-CM

## 2022-05-11 DIAGNOSIS — R296 Repeated falls: Secondary | ICD-10-CM | POA: Diagnosis not present

## 2022-05-11 DIAGNOSIS — R55 Syncope and collapse: Secondary | ICD-10-CM | POA: Diagnosis not present

## 2022-05-11 DIAGNOSIS — I10 Essential (primary) hypertension: Secondary | ICD-10-CM

## 2022-05-11 NOTE — Progress Notes (Unsigned)
Primary Care Provider: [name], MD [facility] Cardiologist: None Electrophysiologist: None  Clinic Note: No chief complaint on file.   ===================================  ASSESSMENT/PLAN   Problem List Items Addressed This Visit   None   ===================================  HPI:    Devin Valdez is a 81 y.o. male who is being seen today for the evaluation of *** at the request of Pickard, Warren T, MD.  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  This occurred while standing ischemia required.  He felt warm sensation and became lightheaded.  He did not fully lose consciousness.  Felt better after having some nausea and vomiting.  In ER was given IV fluids thought to be orthostatic.  Mildly anemic with hemoglobin of 10.8.  Devin Valdez was last seen on 05/02/2022 by Dr. Dennard Schaumann for hospital follow-up-near syncope    Reviewed  CV studies:    The following studies were reviewed today: (if available, images/films reviewed: From Epic Chart or Care Everywhere) TTE 12/21/2019: EF 55 to 60%.  GR 1 DD.  No RWMA.  Normal atrial sizes.  Mild aortic valve sclerosis otherwise normal valves. Carotid Dopplers February 2021: Bilateral ICA <40%.  Bilateral vertebral arteries with normal flow.  Bilateral subclavian arteries are normal flow.   Interval History:   Devin Valdez   CV Review of Symptoms (Summary) Cardiovascular ROS: {roscv:310661}  REVIEWED OF SYSTEMS   ROS  I have reviewed and (if needed) personally updated the patient's problem list, medications, allergies, past medical and surgical history, social and family history.   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 Brigitte Pulse at [facility]    Immunization History  Administered Date(s) Administered   Fluad Quad(high Dose 65+) 08/06/2019   Moderna SARS-COV2 Booster Vaccination 01/03/2020   Moderna Sars-Covid-2 Vaccination 12/06/2019    MEDICATIONS/ALLERGIES   Current Meds  Medication Sig   acetaminophen (TYLENOL) 325 MG tablet Take 650 mg by mouth every 6 (six) hours as needed for mild pain or headache.   Ascorbic Acid (VITAMIN C) 1000 MG tablet Take 1,000 mg by mouth daily.   aspirin 81 MG chewable tablet Chew by mouth daily.   atorvastatin (LIPITOR) 40 MG tablet TAKE ONE TABLET ('40MG'$  TOTAL) BY MOUTH DAILY AT 6PM   Coenzyme Q10 (CO Q-10) 200 MG CAPS Take by mouth daily.   ferrous sulfate 325 (65 FE) MG tablet Take 1 tablet (325 mg total) by mouth 2 (two) times daily with a meal.   losartan (COZAAR) 50 MG tablet TAKE ONE (1) TABLET BY MOUTH EVERY DAY FOR BLOOD PRESSURE   Multiple Vitamins-Minerals (CENTRUM SILVER ADULT 50+) TABS Take 1 capsule by mouth 2 (two) times daily.    No Known Allergies  SOCIAL HISTORY/FAMILY HISTORY   Reviewed in Epic:   Social History   Tobacco Use   Smoking status: Former  Types: Cigarettes    Quit date: 11/21/1985    Years since quitting: 36.4   Smokeless tobacco: Never  Substance Use Topics   Alcohol use: No   Drug use: No   Social History   Social History Narrative   Not on file   Family History  Problem Relation Age of Onset   Multiple sclerosis Son    Bipolar disorder Son     OBJCTIVE -PE, EKG, labs   Wt Readings from Last 3 Encounters:  05/11/22 155 lb (70.3 kg)  05/02/22 166 lb 6.4 oz (75.5 kg)  04/24/22 156 lb (70.8 kg)    Physical Exam: BP 122/76   Pulse 65   Ht '5\' 6"'$  (1.676 m)   Wt 155 lb (70.3 kg)    SpO2 98%   BMI 25.02 kg/m  Physical Exam   Adult ECG Report  Rate: *** ;  Rhythm: {rhythm:17366};   Narrative Interpretation: ***  Recent Labs:  ***  Lab Results  Component Value Date   CHOL 129 04/04/2022   HDL 48 04/04/2022   LDLCALC 60 04/04/2022   TRIG 126 04/04/2022   CHOLHDL 2.7 04/04/2022   Lab Results  Component Value Date   CREATININE 1.23 04/24/2022   BUN 22 04/24/2022   NA 139 04/24/2022   K 4.2 04/24/2022   CL 107 04/24/2022   CO2 26 04/24/2022      Latest Ref Rng & Units 05/02/2022   10:21 AM 04/24/2022    1:06 PM 04/04/2022   11:34 AM  CBC  WBC 3.8 - 10.8 Thousand/uL 5.0  5.9  5.7   Hemoglobin 13.2 - 17.1 g/dL 10.9  10.8  11.1   Hematocrit 38.5 - 50.0 % 32.9  34.3  34.2   Platelets 140 - 400 Thousand/uL 204  174  198     Lab Results  Component Value Date   HGBA1C 4.6 (L) 01/14/2020   Lab Results  Component Value Date   TSH 2.11 12/19/2019    ==================================================  COVID-19 Education: The signs and symptoms of COVID-19 were discussed with the patient and how to seek care for testing (follow up with PCP or arrange E-visit).    I spent a total of *** minutes with the patient spent in direct patient consultation.  Additional time spent with chart review  / charting (studies, outside notes, etc): *** min Total Time: *** min  Current medicines are reviewed at length with the patient today.  (+/- concerns) ***  This visit occurred during the SARS-CoV-2 public health emergency.  Safety protocols were in place, including screening questions prior to the visit, additional usage of staff PPE, and extensive cleaning of exam room while observing appropriate contact time as indicated for disinfecting solutions.  Notice: This dictation was prepared with Dragon dictation along with smart phrase technology. Any transcriptional errors that result from this process are unintentional and may not be corrected upon review.   Studies  Ordered:  No orders of the defined types were placed in this encounter.  No orders of the defined types were placed in this encounter.   Patient Instructions / Medication Changes & Studies & Tests Ordered   There are no Patient Instructions on file for this visit.    Glenetta Hew, M.D., M.S. Interventional Cardiologist   Pager # [phone] Phone # [phone] [address] [place], [place] [address]   Thank you for choosing [facility] at [facility]!!

## 2022-05-11 NOTE — Patient Instructions (Addendum)
Medication Instructions:  No changes  *If you need a refill on your cardiac medications before your next appointment, please call your pharmacy*   Lab Work: Not needed   Testing/Procedures:  Will be schedule at [address] Your physician has requested that you have an echocardiogram. Echocardiography is a painless test that uses sound waves to create images of your heart. It provides your doctor with information about the size and shape of your heart and how well your heart's chambers and valves are working. This procedure takes approximately one hour. There are no restrictions for this procedure.  Will be mailed to your home in 3 to 5 days Your physician has recommended that you wear a holter monitor. Holter monitors are medical devices that record the heart's electrical activity. Doctors most often use these monitors to diagnose arrhythmias. Arrhythmias are problems with the speed or rhythm of the heartbeat. The monitor is a small, portable device. You can wear one while you do your normal daily activities. This is usually used to diagnose what is causing palpitations/syncope (passing out).   Follow-Up: At [facility], you and your health needs are our priority.  As part of our continuing mission to provide you with exceptional heart care, we have created designated Provider Care Teams.  These Care Teams include your primary Cardiologist (physician) and Advanced Practice Providers (APPs -  Physician Assistants and Nurse Practitioners) who all work together to provide you with the care you need, when you need it.     Your next appointment:   2 month(s)  The format for your next appointment:   In Person  Provider:   Glenetta Hew, MD    Other Instructions  ZIO XT- Long Term Monitor Instructions  Your physician has requested you wear a ZIO patch monitor for 14 days.  This is a single patch monitor. [employer] supplies one patch monitor  per enrollment. Additional stickers are not available. Please do not apply patch if you will be having a Nuclear Stress Test,  Echocardiogram, Cardiac CT, MRI, or Chest Xray during the period you would be wearing the  monitor. The patch cannot be worn during these tests. You cannot remove and re-apply the  ZIO XT patch monitor.  Your ZIO patch monitor will be mailed 3 day USPS to your address on file. It may take 3-5 days  to receive your monitor after you have been enrolled.  Once you have received your monitor, please review the enclosed instructions. Your monitor  has already been registered assigning a specific monitor serial # to you.  Billing and Patient Assistance Program Information  We have supplied [employer] with any of your insurance information on file for billing purposes. [employer] offers a sliding scale Patient Assistance Program for patients that do not have  insurance, or whose insurance does not completely cover the cost of the ZIO monitor.  You must apply for the Patient Assistance Program to qualify for this discounted rate.  To apply, please call [employer] at [phone], select option 4, select option 2, ask to apply for  Patient Assistance Program. Theodore Demark will ask your household income, and how many people  are in your household. They will quote your out-of-pocket cost based on that information.  [employer] will also be able to set up a 36-month interest-free payment plan if needed.  Applying the monitor   Shave hair from upper left chest.  Hold abrader disc by orange tab. Rub abrader in 40  strokes over the upper left chest as  indicated in your monitor instructions.  Clean area with 4 enclosed alcohol pads. Let dry.  Apply patch as indicated in monitor instructions. Patch will be placed under collarbone on left  side of chest with arrow pointing upward.  Rub patch adhesive wings for 2 minutes. Remove white label marked "1". Remove the white  label marked "2". Rub patch  adhesive wings for 2 additional minutes.  While looking in a mirror, press and release button in center of patch. A small green light will  flash 3-4 times. This will be your only indicator that the monitor has been turned on.  Do not shower for the first 24 hours. You may shower after the first 24 hours.  Press the button if you feel a symptom. You will hear a small click. Record Date, Time and  Symptom in the Patient Logbook.  When you are ready to remove the patch, follow instructions on the last 2 pages of Patient  Logbook. Stick patch monitor onto the last page of Patient Logbook.  Place Patient Logbook in the blue and white box. Use locking tab on box and tape box closed  securely. The blue and white box has prepaid postage on it. Please place it in the mailbox as  soon as possible. Your physician should have your test results approximately 7 days after the  monitor has been mailed back to [facility].  Call [employer] at [phone] if you have questions regarding  your ZIO XT patch monitor. Call them immediately if you see an orange light blinking on your  monitor.  If your monitor falls off in less than 4 days, contact our Monitor department at [phone].  If your monitor becomes loose or falls off after 4 days call [employer] at [phone] for  suggestions on securing your monitor

## 2022-05-11 NOTE — Progress Notes (Unsigned)
Enrolled patient for a 14 day Zio XT  monitor to be mailed to patients home  °

## 2022-05-13 DIAGNOSIS — I10 Essential (primary) hypertension: Secondary | ICD-10-CM | POA: Diagnosis not present

## 2022-05-13 DIAGNOSIS — R55 Syncope and collapse: Secondary | ICD-10-CM

## 2022-05-16 ENCOUNTER — Encounter: Payer: Self-pay | Admitting: Cardiology

## 2022-05-16 NOTE — Assessment & Plan Note (Signed)
Ectopy on exam with PVCs previously recorded.  I agree with Dr. Haynes Dage that the near syncopal episode was not likely not cardiogenic in nature.  However, not an example to evaluate for arrhythmia.     Check 14-day Zio patch monitor to exclude arrhythmia.

## 2022-05-16 NOTE — Assessment & Plan Note (Signed)
Blood pressure was pretty well controlled.  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

## 2022-05-20 ENCOUNTER — Ambulatory Visit (HOSPITAL_COMMUNITY)
Admission: RE | Admit: 2022-05-20 | Discharge: 2022-05-20 | Disposition: A | Discharge status: Home / Self Care | Payer: Medicare Other | Source: Ambulatory Visit | Attending: Cardiology | Admitting: Cardiology

## 2022-05-20 DIAGNOSIS — I10 Essential (primary) hypertension: Secondary | ICD-10-CM | POA: Diagnosis not present

## 2022-05-20 DIAGNOSIS — R55 Syncope and collapse: Secondary | ICD-10-CM | POA: Insufficient documentation

## 2022-05-20 LAB — ECHOCARDIOGRAM COMPLETE
AR max vel: 2.02 cm2
AV Area VTI: 2.11 cm2
AV Area mean vel: 1.96 cm2
AV Mean grad: 4 mmHg
AV Peak grad: 8.1 mmHg
Ao pk vel: 1.42 m/s
Area-P 1/2: 3.33 cm2
Calc EF: 61 %
MV VTI: 2.23 cm2
S' Lateral: 2.8 cm
Single Plane A2C EF: 51.4 %
Single Plane A4C EF: 65.6 %

## 2022-05-20 NOTE — Progress Notes (Signed)
*  PRELIMINARY RESULTS* Echocardiogram 2D Echocardiogram has been performed.  Devin Valdez 05/20/2022, 11:49 AM

## 2022-05-23 ENCOUNTER — Encounter (INDEPENDENT_AMBULATORY_CARE_PROVIDER_SITE_OTHER): Payer: Medicare Other | Admitting: Ophthalmology

## 2022-05-30 ENCOUNTER — Encounter (INDEPENDENT_AMBULATORY_CARE_PROVIDER_SITE_OTHER): Payer: Self-pay | Admitting: Ophthalmology

## 2022-05-30 ENCOUNTER — Ambulatory Visit (INDEPENDENT_AMBULATORY_CARE_PROVIDER_SITE_OTHER): Payer: Medicare Other | Admitting: Ophthalmology

## 2022-05-30 DIAGNOSIS — H348112 Central retinal vein occlusion, right eye, stable: Secondary | ICD-10-CM | POA: Insufficient documentation

## 2022-05-30 DIAGNOSIS — H4322 Crystalline deposits in vitreous body, left eye: Secondary | ICD-10-CM | POA: Diagnosis not present

## 2022-05-30 DIAGNOSIS — H34812 Central retinal vein occlusion, left eye, with macular edema: Secondary | ICD-10-CM

## 2022-05-30 MED ORDER — BEVACIZUMAB 2.5 MG/0.1ML IZ SOSY
2.5000 mg | PREFILLED_SYRINGE | INTRAVITREAL | Status: AC | PRN
  Administered 2022-05-30: 2.5 mg via INTRAVITREAL

## 2022-05-30 NOTE — Assessment & Plan Note (Signed)
With no evidence of ongoing macular edema in the right eye by OCT, will continue to observe OD

## 2022-05-30 NOTE — Assessment & Plan Note (Signed)
Confirmed by FFA OS today with retinal thickening temporally in center threatening.

## 2022-05-30 NOTE — Assessment & Plan Note (Signed)
OS not visually significant

## 2022-05-30 NOTE — Progress Notes (Signed)
05/30/2022     CHIEF COMPLAINT Patient presents for  Chief Complaint  Patient presents with   Retina Follow Up      HISTORY OF PRESENT ILLNESS: Devin Valdez is a 81 y.o. male who presents to the clinic today for:   HPI     Retina Follow Up           Diagnosis: Other   Laterality: left eye   Severity: moderate   Course: stable         Comments   8 weeks for DILATE OU, OPTOS FFA L/R, COLOR FP, OCT, AVASTIN OS. Pt stated vision has not changed since last visit.         Last edited by Silvestre Moment on 05/30/2022 10:55 AM.      Referring physician: [name], MD [address] [facility] [address] [place],  [place] [address]  HISTORICAL INFORMATION:   Selected notes from the MEDICAL RECORD NUMBER    Lab Results  Component Value Date   HGBA1C 4.6 (L) 01/14/2020     CURRENT MEDICATIONS: No current outpatient medications on file. (Ophthalmic Drugs)   No current facility-administered medications for this visit. (Ophthalmic Drugs)   Current Outpatient Medications (Other)  Medication Sig   acetaminophen (TYLENOL) 325 MG tablet Take 650 mg by mouth every 6 (six) hours as needed for mild pain or headache.   Ascorbic Acid (VITAMIN C) 1000 MG tablet Take 1,000 mg by mouth daily.   aspirin 81 MG chewable tablet Chew by mouth daily.   atorvastatin (LIPITOR) 40 MG tablet TAKE ONE TABLET ('40MG'$  TOTAL) BY MOUTH DAILY AT 6PM   Coenzyme Q10 (CO Q-10) 200 MG CAPS Take by mouth daily.   ferrous sulfate 325 (65 FE) MG tablet Take 1 tablet (325 mg total) by mouth 2 (two) times daily with a meal.   losartan (COZAAR) 50 MG tablet TAKE ONE (1) TABLET BY MOUTH EVERY DAY FOR BLOOD PRESSURE   Multiple Vitamins-Minerals (CENTRUM SILVER ADULT 50+) TABS Take 1 capsule by mouth 2 (two) times daily.   No current facility-administered medications for this visit. (Other)      REVIEW OF SYSTEMS: ROS   Negative for: Constitutional, Gastrointestinal, Neurological, Skin, Genitourinary,  Musculoskeletal, HENT, Endocrine, Cardiovascular, Eyes, Respiratory, Psychiatric, Allergic/Imm, Heme/Lymph Last edited by Silvestre Moment on 05/30/2022 10:55 AM.       ALLERGIES No Known Allergies  PAST MEDICAL HISTORY Past Medical History:  Diagnosis Date   Anemia    Anxiety    Cervical stenosis of spinal canal    mri 8/19- severe   Depression    Falls    Hallucinations    Hyperlipidemia    Hypertension    Stroke [facility])    left paracentral pons infarct   Past Surgical History:  Procedure Laterality Date   Carotid Dopplers  12/2019   CVA evaluation: Bilateral ICA <40%.  Bilateral vertebral arteries with normal flow.  Bilateral subclavian arteries are normal flow.   EYE SURGERY     HERNIA REPAIR     POSTERIOR CERVICAL LAMINECTOMY     with fusion C3-C7 10/19- Dr. Brigitte Pulse at [facility] ECHOCARDIOGRAM  11/2019   (CVA evaluation) EF 55 to 60%.  GR 1 DD.  No RWMA.  Normal atrial sizes.  Mild aortic valve sclerosis otherwise normal valves.    FAMILY HISTORY Family History  Problem Relation Age of Onset   Multiple sclerosis Son    Bipolar disorder Son     SOCIAL  HISTORY Social History   Tobacco Use   Smoking status: Former    Types: Cigarettes    Quit date: 11/21/1985    Years since quitting: 36.5   Smokeless tobacco: Never  Substance Use Topics   Alcohol use: No   Drug use: No         OPHTHALMIC EXAM:  Base Eye Exam     Visual Acuity (ETDRS)       Right Left   Dist cc 20/40 -2 20/60 -2   Dist ph cc 20/30 -2 20/50 -2    Correction: Glasses         Tonometry (Tonopen, 11:01 AM)       Right Left   Pressure 13 12         Pupils       Pupils APD   Right PERRL None   Left PERRL None         Visual Fields       Left Right    Full Full         Extraocular Movement       Right Left    Full Full         Neuro/Psych     Oriented x3: Yes   Mood/Affect: Normal         Dilation     Both eyes: 1.0% Mydriacyl, 2.5%  Phenylephrine @ 11:01 AM           Slit Lamp and Fundus Exam     External Exam       Right Left   External Normal Normal         Slit Lamp Exam       Right Left   Lids/Lashes Normal Normal   Conjunctiva/Sclera White and quiet White and quiet   Cornea Clear Clear   Anterior Chamber Deep and quiet Deep and quiet   Iris Round and reactive Round and reactive   Lens Posterior chamber intraocular lens Posterior chamber intraocular lens   Anterior Vitreous Normal Normal         Fundus Exam       Right Left   Posterior Vitreous  ,, Central vitreous floaters   Disc  collaterals   C/D Ratio  0.4   Macula  Macular thickening Microaneurysms, Cystoid macular edema, Intraretinal hemorrhage less, large subfoveal druse, Exudates temporally   Vessels  Hemiretinal vein occlusion superiorly.   Periphery  Normal            IMAGING AND PROCEDURES  Imaging and Procedures for 05/30/22  OCT, Retina - OU - Both Eyes       Right Eye Quality was good. Scan locations included subfoveal. Central Foveal Thickness: 288. Progression has been stable. Findings include normal foveal contour.   Left Eye Quality was good. Scan locations included subfoveal. Central Foveal Thickness: 309. Progression has been stable. Findings include cystoid macular edema, vitreous traction, vitreomacular adhesion .   Notes Outer retinal photoreceptor EZ layer disruption center fovea accounts for the acuity, OS.  At 9 weeks today, CME and temporal to the [place] from Hemi central vein occlusion has increased.  We will repeat injection today and examination OS next in 8 weeks      Intravitreal Injection, Pharmacologic Agent - OS - Left Eye       Time Out 05/30/2022. 11:46 AM. Confirmed correct patient, procedure, site, and patient consented.   Anesthesia Topical anesthesia was used. Anesthetic medications included Lidocaine 4%.   Procedure Preparation included  5% betadine to ocular surface, 10% betadine  to eyelids, Tobramycin 0.3%. A 30 gauge needle was used.   Injection: 2.5 mg bevacizumab 2.5 MG/0.1ML   Route: Intravitreal, Site: Left Eye   NDC: [phone], Lot: [id number], Expiration date: 07/22/2022   Post-op Post injection exam found visual acuity of at least counting fingers. The patient tolerated the procedure well. There were no complications. The patient received written and verbal post procedure care education. Post injection medications included ocuflox.      Color Fundus Photography Optos - OU - Both Eyes       Right Eye Progression has been stable. Disc findings include normal observations. Macula : normal observations. Periphery : normal observations.   Left Eye Progression has been stable. Disc findings include normal observations. Macula : edema, microaneurysms.   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 Mid/Late phase findings include microaneurysm.   Left Eye Progression has been stable. Early phase findings include leakage, microaneurysm. Mid/Late phase findings include leakage, microaneurysm. Choroidal neovascularization is not present.   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  OS, with center involved CME from hemispheric CRVO.  Stable but still active CME temporally with risk of scarring centrally.  Repeat injection today at 8-week interval maintain 8-week follow-up OU  2.  ED also history of CRVO in the past now compensated no impact on acuity will observe  3.  Ophthalmic Meds Ordered this visit:  Meds ordered this encounter  Medications   bevacizumab (AVASTIN) SOSY 2.5 mg       Return in about 8 weeks (around 07/25/2022) for dilate, OS, AVASTIN OCT.  There are no Patient Instructions on file for this visit.   Explained the diagnoses, plan, and follow up with the patient and they expressed understanding.  Patient expressed understanding of the importance of proper follow up care.   Clent Demark Rogenia Werntz M.D. Diseases & Surgery of the Retina and Vitreous [facility] 05/30/22     Abbreviations: M myopia (nearsighted); A astigmatism; H hyperopia (farsighted); P presbyopia; Mrx  spectacle prescription;  CTL contact lenses; OD right eye; OS left eye; OU both eyes  XT exotropia; ET esotropia; PEK punctate epithelial keratitis; PEE punctate epithelial erosions; DES dry eye syndrome; MGD meibomian gland dysfunction; ATs artificial tears; PFAT's preservative free artificial tears; [place] nuclear sclerotic cataract; PSC posterior subcapsular cataract; ERM epi-retinal membrane; PVD posterior vitreous detachment; RD retinal detachment; DM diabetes mellitus; DR diabetic retinopathy; NPDR  non-proliferative diabetic retinopathy; PDR proliferative diabetic retinopathy; CSME clinically significant macular edema; DME diabetic macular edema; dbh dot blot hemorrhages; CWS cotton wool spot; POAG primary open angle glaucoma; C/D cup-to-disc ratio; HVF humphrey visual field; GVF goldmann visual field; OCT optical coherence tomography; IOP intraocular pressure; BRVO Branch retinal vein occlusion; CRVO central retinal vein occlusion; CRAO central retinal artery occlusion; BRAO branch retinal artery occlusion; RT retinal tear; SB scleral buckle; PPV pars plana vitrectomy; VH Vitreous hemorrhage; PRP panretinal laser photocoagulation; IVK intravitreal kenalog; VMT vitreomacular traction; MH Macular hole;  NVD neovascularization of the disc; NVE neovascularization elsewhere; AREDS age related eye disease study; ARMD age related macular degeneration; POAG primary open angle glaucoma; EBMD epithelial/anterior basement membrane dystrophy; ACIOL anterior chamber intraocular lens; IOL intraocular lens; PCIOL posterior chamber intraocular lens; Phaco/IOL phacoemulsification with intraocular lens placement; [place] photorefractive keratectomy; LASIK laser assisted in situ keratomileusis; HTN hypertension; DM diabetes mellitus; COPD chronic obstructive pulmonary disease

## 2022-06-06 DIAGNOSIS — R55 Syncope and collapse: Secondary | ICD-10-CM | POA: Diagnosis not present

## 2022-06-06 DIAGNOSIS — I10 Essential (primary) hypertension: Secondary | ICD-10-CM | POA: Diagnosis not present

## 2022-07-27 ENCOUNTER — Ambulatory Visit: Payer: Medicare Other | Attending: Cardiology | Admitting: Cardiology

## 2022-07-27 ENCOUNTER — Encounter (INDEPENDENT_AMBULATORY_CARE_PROVIDER_SITE_OTHER): Payer: Medicare Other | Admitting: Ophthalmology

## 2022-07-27 ENCOUNTER — Encounter: Payer: Self-pay | Admitting: Cardiology

## 2022-07-27 VITALS — BP 128/60 | HR 70 | Ht 66.0 in | Wt 158.0 lb

## 2022-07-27 DIAGNOSIS — I10 Essential (primary) hypertension: Secondary | ICD-10-CM | POA: Insufficient documentation

## 2022-07-27 DIAGNOSIS — I6302 Cerebral infarction due to thrombosis of basilar artery: Secondary | ICD-10-CM | POA: Diagnosis not present

## 2022-07-27 DIAGNOSIS — I493 Ventricular premature depolarization: Secondary | ICD-10-CM

## 2022-07-27 DIAGNOSIS — R55 Syncope and collapse: Secondary | ICD-10-CM | POA: Insufficient documentation

## 2022-07-27 NOTE — Progress Notes (Signed)
Primary Care Provider: [name], MD [place] cardiologist: Devin Hew, MD Electrophysiologist: None  Clinic Note: Chief Complaint  Patient presents with   Follow-up    2 months.    ===================================  ASSESSMENT/PLAN   Problem List Items Addressed This Visit       Cardiology Problems   Near syncope - Primary    Pretty much echo and monitor not contributory as far as etiology for near syncope.  The description alone would be definitive enough to say that it is vasovagal mediated with him feeling hot and flushed in a warm environment.  Probably also exacerbated by hypotension and being on an ARB.  Stressed the importance of adequate hydration, especially since he is taking an antihypertensive agent.  When he starts feeling flushed and uneasy he needs a get somewhere where he can cool off, and drink /eat something.      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  Would not be overly aggressive because of his recent episode of vasovagal hypotension related near syncope.  Would avoid diuretic.  Stable.  Encourage adequate hydration.      PVC (premature ventricular contraction) (Chronic)    He did have about 3.6% PVCs with some couplets and triplets and bigeminy trigeminy.  He did notice the PVCs.  Not overly worrisome to him.  I think in the absence of other symptoms, would not warrant further evaluation.  I would also try to avoid beta-blocker unless he does have worsening symptoms.  He had no sustained arrhythmias.  Echocardiogram did not show any structural abnormality.  Not enough of a PVC burden to warrant ischemic evaluation in the absence of symptoms.  If he were to have concerning symptoms, then I think an ischemic evaluation would be warranted.       ===================================  HPI:    Devin Valdez is a 81 y.o. male with a PMH below who presents today for to have a 69-monthfollow-up evaluation for syncope.  He is seen at the request of [name] MD.  Devin P GBroaduswas seen in initial consultation on 05/11/2022 after an episode of near syncope that occurred while at church.  He was in the choir wearing a gown, singing a song in the choir loft.  Wife indicated they were somewhat hotter than usual.  He started feeling warm and flushed.  This was followed by feeling lightheaded and dizzy.  He started swimming but did not actually lose consciousness.  He was taken out into the vestibule where it was cooler and then took off his gown.  He felt better but did have some nausea vomiting.  ER evaluation was relatively benign, thought to be orthostatic as he felt better after IV fluids. PCP evaluation noted frequent ectopy.  However despite this, the running diagnosis for his episode was vasovagal orthostatic hypotension related near syncope. He was evaluated with an echocardiogram and Zio patch monitor.  Recent Hospitalizations: No further episodes  Reviewed  CV studies:    The following studies were reviewed today: (if available, images/films reviewed: From Epic Chart or Care Everywhere)  TTE 05/20/22: Normal LVEF 60 to 65%.  No RWMA.  Mild concentric LVH.  GR 1 DD.  Normal RV size and function.  Normal RVP.  Normal RAP.  Normal aortic and mitral valves. No significant  change from prior study.  14-day Zio Patch Wear Time June 2023 Predominant underlying rhythm was Sinus Rhythm: HR range 52-131 bpm, average 64 bpm. 5 atrial runs: Fastest-4 beats, max HR 122 bpm (1.8 sec), longest 6 beats-avg 109 bpm (3.4 sec). Rare PACs (< (1%) with occasional couplets and triplets. Occasional PVCs (3.6%) rare couplets and triplets.  Occasional bigeminy and trigeminy noted. ->  Symptoms noted with PVCs as well as bigeminy.  (Only 3  triggered events) No sustained arrhythmias: Atrial fibrillation, atrial flutter, atrial tachycardia, supraventricular tachycardia, ventricular tachycardia.  Overall pretty benign, normal study.  No significant arrhythmias. If symptomatic with PVCs, could consider beta-blocker, but would probably try to avoid.:   Interval History:   Devin Valdez returns here today to discuss results of studies.  He is actually feeling well has had no further episodes of syncope or near syncope.  He is paying close attention to adequate hydration.  His blood sugar remained stable on current dose of losartan.  Avoiding diuretic.  Not all that active at baseline.  Somewhat deconditioned.  Has some exertional dyspnea mostly because of deconditioning.  Rare palpitations noted but has not noted any rapid heart rates.  No further syncope or near syncope.  No TIA or emesis fugax symptoms.  CV Review of Symptoms (Summary): positive for - dyspnea on exertion, irregular heartbeat, palpitations, and no rotation before.  He is somewhat sedentary and therefore get some exertional dyspnea if he overdoes it.  Palpitations are fleeting but he did not notice many symptoms were all wearing the monitor. negative for - chest pain, edema, orthopnea, paroxysmal nocturnal dyspnea, rapid heart rate, shortness of breath, or any further episodes of syncope/near syncope or TIA/amaurosis fugax, claudication.  REVIEWED OF SYSTEMS   Still limited by back pain and myalgias.  Legs feel weak and give out on him and therefore he is not all that active.  Deconditioned with some exertional dyspnea.  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 Memory loss.  I have reviewed and (if needed) personally updated the patient's problem list, medications, allergies, past medical and surgical history, social and family history.   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  Bilateral vertebral arteries with normal flow.  Bilateral subclavian arteries are normal flow.   EYE SURGERY     HERNIA REPAIR     POSTERIOR CERVICAL LAMINECTOMY     with fusion C3-C7 10/19- Dr. Brigitte Pulse at [facility] ECHOCARDIOGRAM  11/2019   (CVA evaluation) EF 55 to 60%.  GR 1 DD.  No RWMA.  Normal atrial sizes.  Mild aortic valve sclerosis otherwise normal valves.    Immunization History  Administered Date(s) Administered   Fluad Quad(high Dose 65+) 08/06/2019   Moderna SARS-COV2 Booster Vaccination 01/03/2020   Moderna Sars-Covid-2 Vaccination 12/06/2019    MEDICATIONS/ALLERGIES   Current Meds  Medication Sig   acetaminophen (TYLENOL) 325 MG tablet Take 650 mg by mouth every 6 (six) hours as needed for mild pain or headache.  Ascorbic Acid (VITAMIN C) 1000 MG tablet Take 1,000 mg by mouth daily.   aspirin 81 MG chewable tablet Chew by mouth daily.   atorvastatin (LIPITOR) 40 MG tablet TAKE ONE TABLET ('40MG'$  TOTAL) BY MOUTH DAILY AT 6PM   Coenzyme Q10 (CO Q-10) 200 MG CAPS Take by mouth daily.   ferrous sulfate 325 (65 FE) MG tablet Take 1 tablet (325 mg total) by mouth 2 (two) times daily with a meal.   losartan (COZAAR) 50 MG tablet TAKE ONE (1) TABLET BY MOUTH EVERY DAY FOR BLOOD PRESSURE   Multiple Vitamins-Minerals (CENTRUM SILVER ADULT 50+) TABS Take 1 capsule by mouth 2 (two) times daily.    No Known Allergies  SOCIAL HISTORY/FAMILY HISTORY   Reviewed in Epic:  Pertinent findings:  Social History   Tobacco Use   Smoking status: Former    Types: Cigarettes    Quit date: 11/21/1985    Years since quitting: 36.7   Smokeless tobacco: Never   Substance Use Topics   Alcohol use: No   Drug use: No   Social History   Social History Narrative   Not on file    OBJCTIVE -PE, EKG, labs   Wt Readings from Last 3 Encounters:  07/27/22 158 lb (71.7 kg)  05/11/22 155 lb (70.3 kg)  05/02/22 166 lb 6.4 oz (75.5 kg)    Physical Exam: BP 128/60 (BP Location: Left Arm, Patient Position: Sitting, Cuff Size: Normal)   Pulse 70   Ht '5\' 6"'$  (1.676 m)   Wt 158 lb (71.7 kg)   BMI 25.50 kg/m  Physical Exam Vitals reviewed.  Constitutional:      General: He is not in acute distress.    Appearance: Normal appearance. He is normal weight. He is not ill-appearing or toxic-appearing.     Comments: Healthy-appearing.  Well-groomed.  Well-nourished.  HENT:     Head: Normocephalic and atraumatic.  Neck:     Vascular: No carotid bruit.  Cardiovascular:     Rate and Rhythm: Normal rate and regular rhythm. Occasional Extrasystoles are present.    Chest Wall: PMI is not displaced.     Pulses: Intact distal pulses.     Heart sounds: S1 normal and S2 normal. Murmur (Harsh 1/6 SEM at RUSB-neck) heard.     No friction rub.  Musculoskeletal:     Cervical back: Normal range of motion and neck supple.  Neurological:     Mental Status: He is alert.      Adult ECG Report Not checked  Recent Labs: Reviewed-Labs Lab Results  Component Value Date   CHOL 129 04/04/2022   HDL 48 04/04/2022   LDLCALC 60 04/04/2022   TRIG 126 04/04/2022   CHOLHDL 2.7 04/04/2022   Lab Results  Component Value Date   CREATININE 1.23 04/24/2022   BUN 22 04/24/2022   NA 139 04/24/2022   K 4.2 04/24/2022   CL 107 04/24/2022   CO2 26 04/24/2022      Latest Ref Rng & Units 05/02/2022   10:21 AM 04/24/2022    1:06 PM 04/04/2022   11:34 AM  CBC  WBC 3.8 - 10.8 Thousand/uL 5.0  5.9  5.7   Hemoglobin 13.2 - 17.1 g/dL 10.9  10.8  11.1   Hematocrit 38.5 - 50.0 % 32.9  34.3  34.2   Platelets 140 - 400 Thousand/uL 204  174  198     Lab Results  Component  Value Date   HGBA1C 4.6 (L) 01/14/2020   Lab  Results  Component Value Date   TSH 2.11 12/19/2019    ================================================== I spent a total of 23 minutes with the patient spent in direct patient consultation.  Additional time spent with chart review  / charting (studies, outside notes, etc): 19 min Total Time: 42 min  Current medicines are reviewed at length with the patient today.  (+/- concerns) none  Notice: This dictation was prepared with Dragon dictation along with smart phrase technology. Any transcriptional errors that result from this process are unintentional and may not be corrected upon review.  Studies Ordered:   No orders of the defined types were placed in this encounter.  No orders of the defined types were placed in this encounter.   Patient Instructions / Medication Changes & Studies & Tests Ordered   Patient Instructions  Medication Instructions:  No changes     Lab Work: Not needed    Testing/Procedures: Not needed   Follow-Up: At [facility], you and your health needs are our priority.  As part of our continuing mission to provide you with exceptional heart care, we have created designated Provider Care Teams.  These Care Teams include your primary Cardiologist (physician) and Advanced Practice Providers (APPs -  Physician Assistants and Nurse Practitioners) who all work together to provide you with the care you need, when you need it.     Your next appointment:   As needed  The format for your next appointment:   In Person  Provider:   Glenetta Hew, MD     Other Instructions Not needed  Important Information About Sugar           Leonie Man, MD, MS Devin Valdez, M.D., M.S. Interventional Cardiologist  [place]  Pager # [phone] Phone # [phone] [address] [place], [place] [address]   Thank you for choosing [facility]  [place]!!

## 2022-07-27 NOTE — Patient Instructions (Signed)
Medication Instructions:  No changes     Lab Work: Not needed    Testing/Procedures: Not needed   Follow-Up: At [facility], you and your health needs are our priority.  As part of our continuing mission to provide you with exceptional heart care, we have created designated Provider Care Teams.  These Care Teams include your primary Cardiologist (physician) and Advanced Practice Providers (APPs -  Physician Assistants and Nurse Practitioners) who all work together to provide you with the care you need, when you need it.     Your next appointment:   As needed  The format for your next appointment:   In Person  Provider:   Glenetta Hew, MD     Other Instructions Not needed  Important Information About Sugar

## 2022-08-01 ENCOUNTER — Encounter (INDEPENDENT_AMBULATORY_CARE_PROVIDER_SITE_OTHER): Payer: Self-pay | Admitting: Ophthalmology

## 2022-08-01 ENCOUNTER — Ambulatory Visit (INDEPENDENT_AMBULATORY_CARE_PROVIDER_SITE_OTHER): Payer: Medicare Other | Admitting: Ophthalmology

## 2022-08-01 DIAGNOSIS — H34812 Central retinal vein occlusion, left eye, with macular edema: Secondary | ICD-10-CM

## 2022-08-01 MED ORDER — BEVACIZUMAB CHEMO INJECTION 1.25MG/0.05ML SYRINGE FOR KALEIDOSCOPE
1.2500 mg | INTRAVITREAL | Status: AC | PRN
  Administered 2022-08-01: 1.25 mg via INTRAVITREAL

## 2022-08-01 NOTE — Progress Notes (Signed)
08/01/2022     CHIEF COMPLAINT Patient presents for  Chief Complaint  Patient presents with   Retina Follow Up      HISTORY OF PRESENT ILLNESS: Devin Valdez is a 81 y.o. male who presents to the clinic today for:   HPI     Retina Follow Up           Diagnosis: Other   Laterality: left eye   Severity: moderate   Course: stable         Comments   9 weeks for DILATE OS, AVASTIN OCT. Pt stated no changes in vision since last visit.       Last edited by Silvestre Moment on 08/01/2022  7:59 AM.      Referring physician: [name], MD [id number] [facility] [address] [place],  [place] [address]  HISTORICAL INFORMATION:   Selected notes from the MEDICAL RECORD NUMBER    Lab Results  Component Value Date   HGBA1C 4.6 (L) 01/14/2020     CURRENT MEDICATIONS: No current outpatient medications on file. (Ophthalmic Drugs)   No current facility-administered medications for this visit. (Ophthalmic Drugs)   Current Outpatient Medications (Other)  Medication Sig   acetaminophen (TYLENOL) 325 MG tablet Take 650 mg by mouth every 6 (six) hours as needed for mild pain or headache.   Ascorbic Acid (VITAMIN C) 1000 MG tablet Take 1,000 mg by mouth daily.   aspirin 81 MG chewable tablet Chew by mouth daily.   atorvastatin (LIPITOR) 40 MG tablet TAKE ONE TABLET ('40MG'$  TOTAL) BY MOUTH DAILY AT 6PM   Coenzyme Q10 (CO Q-10) 200 MG CAPS Take by mouth daily.   ferrous sulfate 325 (65 FE) MG tablet Take 1 tablet (325 mg total) by mouth 2 (two) times daily with a meal.   losartan (COZAAR) 50 MG tablet TAKE ONE (1) TABLET BY MOUTH EVERY DAY FOR BLOOD PRESSURE   Multiple Vitamins-Minerals (CENTRUM SILVER ADULT 50+) TABS Take 1 capsule by mouth 2 (two) times daily.   No current facility-administered medications for this visit. (Other)      REVIEW OF SYSTEMS: ROS   Negative for: Constitutional, Gastrointestinal, Neurological, Skin, Genitourinary, Musculoskeletal, HENT, Endocrine,  Cardiovascular, Eyes, Respiratory, Psychiatric, Allergic/Imm, Heme/Lymph Last edited by Silvestre Moment on 08/01/2022  7:59 AM.       ALLERGIES No Known Allergies  PAST MEDICAL HISTORY Past Medical History:  Diagnosis Date   Anemia    Anxiety    Cervical stenosis of spinal canal    mri 8/19- severe   Depression    Falls    Hallucinations    Hyperlipidemia    Hypertension    Stroke [facility])    left paracentral pons infarct   Past Surgical History:  Procedure Laterality Date   Carotid Dopplers  12/2019   CVA evaluation: Bilateral ICA <40%.  Bilateral vertebral arteries with normal flow.  Bilateral subclavian arteries are normal flow.   EYE SURGERY     HERNIA REPAIR     POSTERIOR CERVICAL LAMINECTOMY     with fusion C3-C7 10/19- Dr. Brigitte Pulse at [facility] ECHOCARDIOGRAM  11/2019   (CVA evaluation) EF 55 to 60%.  GR 1 DD.  No RWMA.  Normal atrial sizes.  Mild aortic valve sclerosis otherwise normal valves.    FAMILY HISTORY Family History  Problem Relation Age of Onset   Multiple sclerosis Son    Bipolar disorder Son     SOCIAL HISTORY Social History   Tobacco  Use   Smoking status: Former    Types: Cigarettes    Quit date: 11/21/1985    Years since quitting: 36.7   Smokeless tobacco: Never  Substance Use Topics   Alcohol use: No   Drug use: No         OPHTHALMIC EXAM:  Base Eye Exam     Visual Acuity (ETDRS)       Right Left   Dist cc 20/40 -2 20/60 -1   Dist ph cc NI 20/50 -2    Correction: Glasses         Tonometry (Tonopen, 8:06 AM)       Right Left   Pressure 13 14         Pupils       Pupils APD   Right PERRL None   Left PERRL None         Visual Fields       Left Right    Full Full         Extraocular Movement       Right Left    Full, Ortho Full, Ortho         Neuro/Psych     Oriented x3: Yes   Mood/Affect: Normal         Dilation     Left eye: 2.5% Phenylephrine, 1.0% Mydriacyl @ 8:06 AM            Slit Lamp and Fundus Exam     External Exam       Right Left   External Normal Normal         Slit Lamp Exam       Right Left   Lids/Lashes Normal Normal   Conjunctiva/Sclera White and quiet White and quiet   Cornea Clear Clear   Anterior Chamber Deep and quiet Deep and quiet   Iris Round and reactive Round and reactive   Lens Posterior chamber intraocular lens Posterior chamber intraocular lens   Anterior Vitreous Normal Normal         Fundus Exam       Right Left   Posterior Vitreous  ,, Central vitreous floaters   Disc  collaterals   C/D Ratio  0.4   Macula  Macular thickening Microaneurysms, Cystoid macular edema, Intraretinal hemorrhage less, large subfoveal druse, Exudates temporally   Vessels  Hemiretinal vein occlusion superiorly.   Periphery  Normal            IMAGING AND PROCEDURES  Imaging and Procedures for 08/01/22  OCT, Retina - OU - Both Eyes       Right Eye Quality was good. Scan locations included subfoveal. Central Foveal Thickness: 285. Progression has been stable. Findings include normal foveal contour.   Left Eye Quality was good. Scan locations included subfoveal. Central Foveal Thickness: 308. Progression has been stable. Findings include cystoid macular edema, vitreous traction, vitreomacular adhesion .   Notes Outer retinal photoreceptor EZ layer disruption center fovea accounts for the acuity, OS.  At 9 weeks today, CME and temporal to the [place] from Hemi central vein occlusion has increased.  We will repeat injection today and examination OS next in 8 weeks      Intravitreal Injection, Pharmacologic Agent - OS - Left Eye       Time Out 08/01/2022. 8:55 AM. Confirmed correct patient, procedure, site, and patient consented.   Anesthesia Topical anesthesia was used. Anesthetic medications included Lidocaine 4%.   Procedure Preparation included 5% betadine to ocular surface,  10% betadine to eyelids, Tobramycin 0.3%. A 30  gauge needle was used.   Injection: 1.25 mg Bevacizumab 1.'25mg'$ /0.26m   Route: Intravitreal, Site: Left Eye   NDC: [id number] Lot: [id number] Expiration date: 10/05/2022   Post-op Post injection exam found visual acuity of at least counting fingers. The patient tolerated the procedure well. There were no complications. The patient received written and verbal post procedure care education. Post injection medications included ocuflox.              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  OS today looks great.  But still active disease temporal portion of the fovea.  Controlled with stable acuity.  Repeat injection today reevaluate next in 9 weeks  2.  3.  Ophthalmic Meds Ordered this visit:  Meds ordered this encounter  Medications   Bevacizumab (AVASTIN) SOLN 1.25 mg       Return in about 9 weeks (around 10/03/2022) for dilate, AVASTIN OCT.  There are no Patient Instructions on file for this visit.   Explained the diagnoses, plan, and follow up with the patient and they expressed understanding.  Patient expressed understanding of the importance of proper follow up care.   GClent DemarkRankin M.D. Diseases & Surgery of the Retina and Vitreous Retina & Diabetic [place]09/11/23     Abbreviations: M myopia (nearsighted); A astigmatism; H hyperopia (farsighted); P presbyopia; Mrx spectacle prescription;  CTL contact lenses; OD right eye; OS left eye; OU both eyes  XT exotropia; ET esotropia; PEK punctate epithelial keratitis; PEE punctate  epithelial erosions; DES dry eye syndrome; MGD meibomian gland dysfunction; ATs artificial tears; PFAT's preservative free artificial tears; [place] sclerotic cataract; PSC posterior subcapsular cataract; ERM epi-retinal membrane; PVD posterior vitreous detachment; RD retinal detachment; DM diabetes mellitus; DR diabetic retinopathy; NPDR non-proliferative diabetic retinopathy; PDR proliferative diabetic retinopathy; CSME clinically significant macular edema; DME diabetic macular edema; dbh dot blot hemorrhages; CWS cotton wool spot; POAG primary open angle glaucoma; C/D cup-to-disc ratio; HVF humphrey visual field; GVF goldmann visual field; OCT optical coherence tomography; IOP intraocular pressure; BRVO Branch retinal vein occlusion; CRVO central retinal vein occlusion; CRAO central retinal artery occlusion; BRAO branch retinal artery occlusion; RT retinal tear; SB scleral buckle; PPV pars plana vitrectomy; VH Vitreous hemorrhage; PRP panretinal laser photocoagulation; IVK intravitreal kenalog; VMT vitreomacular traction; MH Macular hole;  NVD neovascularization of the disc; NVE neovascularization elsewhere; AREDS age related eye disease study; ARMD age related macular degeneration; POAG primary open angle glaucoma; EBMD epithelial/anterior basement membrane dystrophy; ACIOL anterior chamber intraocular lens; IOL intraocular lens; PCIOL posterior chamber intraocular lens; Phaco/IOL phacoemulsification with intraocular lens placement; [place]qual[place]photorefractive keratectomy; LASIK laser assisted in situ keratomileusis; HTN hypertension; DM diabetes mellitus; COPD chronic obstructive pulmonary disease

## 2022-08-01 NOTE — Assessment & Plan Note (Signed)
Chronic active temporal fovea, central scarring limits acuity, at 9-week interval today repeat injection Avastin today and follow-up again in 9 weeks

## 2022-08-04 NOTE — Assessment & Plan Note (Addendum)
He did have about 3.6% PVCs with some couplets and triplets and bigeminy trigeminy.  He did notice the PVCs.  Not overly worrisome to him.  I think in the absence of other symptoms, would not warrant further evaluation.  I would also try to avoid beta-blocker unless he does have worsening symptoms.  He had no sustained arrhythmias.  Echocardiogram did not show any structural abnormality.  Not enough of a PVC burden to warrant ischemic evaluation in the absence of symptoms.  If he were to have concerning symptoms, then I think an ischemic evaluation would be warranted.

## 2022-08-04 NOTE — Assessment & Plan Note (Signed)
Pretty much echo and monitor not contributory as far as etiology for near syncope.  The description alone would be definitive enough to say that it is vasovagal mediated with him feeling hot and flushed in a warm environment.  Probably also exacerbated by hypotension and being on an ARB.  Stressed the importance of adequate hydration, especially since he is taking an antihypertensive agent.  When he starts feeling flushed and uneasy he needs a get somewhere where he can cool off, and drink /eat something.

## 2022-08-04 NOTE — Assessment & Plan Note (Signed)
Well-controlled BP today on current dose of losartan.  Would not be overly aggressive because of his recent episode of vasovagal hypotension related near syncope.  Would avoid diuretic.  Stable.  Encourage adequate hydration.

## 2022-08-04 NOTE — Assessment & Plan Note (Signed)
Restrictive modification with aspirin statin and ARB

## 2022-09-09 ENCOUNTER — Other Ambulatory Visit: Payer: Self-pay | Admitting: Family Medicine

## 2022-09-12 NOTE — Telephone Encounter (Signed)
Requested Prescriptions  Pending Prescriptions Disp Refills  . atorvastatin (LIPITOR) 40 MG tablet [Pharmacy Med Name: ATORVASTATIN CALCIUM 40 MG TAB] 90 tablet 2    Sig: TAKE ONE TABLET ('40MG'$  TOTAL) BY MOUTH DAILY AT 6PM     Cardiovascular:  Antilipid - Statins Failed - 09/09/2022  5:37 PM      Failed - Lipid Panel in normal range within the last 12 months    Cholesterol  Date Value Ref Range Status  04/04/2022 129 <200 mg/dL Final   LDL Cholesterol (Calc)  Date Value Ref Range Status  04/04/2022 60 mg/dL (calc) Final    Comment:    Reference range: <100 . Desirable range <100 mg/dL for primary prevention;   <70 mg/dL for patients with CHD or diabetic patients  with > or = 2 CHD risk factors. Marland Kitchen LDL-C is now calculated using the Martin-Hopkins  calculation, which is a validated novel method providing  better accuracy than the Friedewald equation in the  estimation of LDL-C.  Cresenciano Genre et al. Annamaria Helling. 7711;657(90): 2061-2068  (http://education.[facility].com/faq/FAQ164)    HDL  Date Value Ref Range Status  04/04/2022 48 > OR = 40 mg/dL Final   Triglycerides  Date Value Ref Range Status  04/04/2022 126 <150 mg/dL Final         Passed - Patient is not pregnant      Passed - Valid encounter within last 12 months    Recent Outpatient Visits          5 months ago Pure hypercholesterolemia   [place] Pickard, Cammie Mcgee, MD   1 year ago Pure hypercholesterolemia   [place] Pickard, Cammie Mcgee, MD   1 year ago Encounter for initial annual wellness visit in Medicare patient   [place] Eulogio Bear, NP   1 year ago History of stroke   [place] Pickard, Cammie Mcgee, MD   2 years ago Bilateral carpal tunnel syndrome   [place] Pickard, Cammie Mcgee, MD

## 2022-10-04 ENCOUNTER — Encounter (INDEPENDENT_AMBULATORY_CARE_PROVIDER_SITE_OTHER): Payer: Medicare Other | Admitting: Ophthalmology

## 2022-10-18 DIAGNOSIS — H4322 Crystalline deposits in vitreous body, left eye: Secondary | ICD-10-CM | POA: Diagnosis not present

## 2022-10-18 DIAGNOSIS — H35352 Cystoid macular degeneration, left eye: Secondary | ICD-10-CM | POA: Diagnosis not present

## 2022-10-18 DIAGNOSIS — H34832 Tributary (branch) retinal vein occlusion, left eye, with macular edema: Secondary | ICD-10-CM | POA: Diagnosis not present

## 2022-10-18 DIAGNOSIS — H353132 Nonexudative age-related macular degeneration, bilateral, intermediate dry stage: Secondary | ICD-10-CM | POA: Diagnosis not present

## 2022-10-18 DIAGNOSIS — H348112 Central retinal vein occlusion, right eye, stable: Secondary | ICD-10-CM | POA: Diagnosis not present

## 2022-10-18 DIAGNOSIS — H43822 Vitreomacular adhesion, left eye: Secondary | ICD-10-CM | POA: Diagnosis not present

## 2022-11-02 ENCOUNTER — Other Ambulatory Visit: Payer: Self-pay | Admitting: Family Medicine

## 2022-11-23 DIAGNOSIS — H4322 Crystalline deposits in vitreous body, left eye: Secondary | ICD-10-CM | POA: Diagnosis not present

## 2022-11-23 DIAGNOSIS — H43822 Vitreomacular adhesion, left eye: Secondary | ICD-10-CM | POA: Diagnosis not present

## 2022-11-23 DIAGNOSIS — H348112 Central retinal vein occlusion, right eye, stable: Secondary | ICD-10-CM | POA: Diagnosis not present

## 2022-11-23 DIAGNOSIS — H35352 Cystoid macular degeneration, left eye: Secondary | ICD-10-CM | POA: Diagnosis not present

## 2022-11-23 DIAGNOSIS — H353132 Nonexudative age-related macular degeneration, bilateral, intermediate dry stage: Secondary | ICD-10-CM | POA: Diagnosis not present

## 2022-11-23 DIAGNOSIS — H34832 Tributary (branch) retinal vein occlusion, left eye, with macular edema: Secondary | ICD-10-CM | POA: Diagnosis not present

## 2022-12-27 DIAGNOSIS — H348112 Central retinal vein occlusion, right eye, stable: Secondary | ICD-10-CM | POA: Diagnosis not present

## 2022-12-27 DIAGNOSIS — H43822 Vitreomacular adhesion, left eye: Secondary | ICD-10-CM | POA: Diagnosis not present

## 2022-12-27 DIAGNOSIS — H34832 Tributary (branch) retinal vein occlusion, left eye, with macular edema: Secondary | ICD-10-CM | POA: Diagnosis not present

## 2022-12-27 DIAGNOSIS — H35352 Cystoid macular degeneration, left eye: Secondary | ICD-10-CM | POA: Diagnosis not present

## 2022-12-27 DIAGNOSIS — H353122 Nonexudative age-related macular degeneration, left eye, intermediate dry stage: Secondary | ICD-10-CM | POA: Diagnosis not present

## 2022-12-27 DIAGNOSIS — H353112 Nonexudative age-related macular degeneration, right eye, intermediate dry stage: Secondary | ICD-10-CM | POA: Diagnosis not present

## 2022-12-27 DIAGNOSIS — H4322 Crystalline deposits in vitreous body, left eye: Secondary | ICD-10-CM | POA: Diagnosis not present

## 2023-01-03 ENCOUNTER — Ambulatory Visit (INDEPENDENT_AMBULATORY_CARE_PROVIDER_SITE_OTHER): Payer: Medicare Other | Admitting: Family Medicine

## 2023-01-03 ENCOUNTER — Encounter: Payer: Self-pay | Admitting: Family Medicine

## 2023-01-03 VITALS — BP 142/80 | HR 79 | Temp 98.6°F | Ht 66.0 in | Wt 158.2 lb

## 2023-01-03 DIAGNOSIS — Z8673 Personal history of transient ischemic attack (TIA), and cerebral infarction without residual deficits: Secondary | ICD-10-CM

## 2023-01-03 DIAGNOSIS — R5383 Other fatigue: Secondary | ICD-10-CM | POA: Diagnosis not present

## 2023-01-03 DIAGNOSIS — I10 Essential (primary) hypertension: Secondary | ICD-10-CM

## 2023-01-03 DIAGNOSIS — E7800 Pure hypercholesterolemia, unspecified: Secondary | ICD-10-CM | POA: Diagnosis not present

## 2023-01-03 NOTE — Progress Notes (Signed)
Subjective:    Patient ID: Devin Valdez, male    DOB: 1940-12-17, 82 y.o.   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  He also has a history of cervical stenosis and spinal canal.  Due to both of these conditions he has weakness and gait imbalance.  However he is walking without a cane.  He has a slow slightly unsteady gait but he is able to get around and he denies any falls.  His wife is concerned because she states that he has fatigue.  She states that he just sits most of the day and does not want to do anything.  He denies any chest pain or shortness of breath or dyspnea on exertion.  He denies any depression.  He denies any nausea or vomiting or abdominal pain. Wt Readings from Last 3 Encounters:  01/03/23 158 lb 3.2 oz (71.8 kg)  07/27/22 158 lb (71.7 kg)  05/11/22 155 lb (70.3 kg)   His weight has remained stable.  He states he just does not feel like doing anything.  He is not exercising regularly.  His blood pressure today is well-controlled.  He is due to recheck his lab work Past Medical History:  Diagnosis Date   Anemia    Anxiety    Cervical stenosis of spinal canal    mri 8/19- severe   Depression    Falls    Hallucinations    Hyperlipidemia    Hypertension    Stroke [facility])    left paracentral pons infarct   Past Surgical History:  Procedure Laterality Date   Carotid Dopplers  12/2019   CVA evaluation: Bilateral ICA <40%.  Bilateral vertebral arteries with normal flow.  Bilateral subclavian arteries are normal flow.   EYE SURGERY     HERNIA REPAIR     POSTERIOR CERVICAL LAMINECTOMY     with fusion C3-C7 10/19- Dr. Brigitte Pulse at [facility] ECHOCARDIOGRAM  11/2019   (CVA evaluation) EF 55 to 60%.  GR 1 DD.  No RWMA.  Normal atrial sizes.  Mild aortic valve sclerosis otherwise normal valves.   Current Outpatient Medications on File Prior to Visit   Medication Sig Dispense Refill   acetaminophen (TYLENOL) 325 MG tablet Take 650 mg by mouth every 6 (six) hours as needed for mild pain or headache.     Ascorbic Acid (VITAMIN C) 1000 MG tablet Take 1,000 mg by mouth daily.     aspirin 81 MG chewable tablet Chew by mouth daily.     atorvastatin (LIPITOR) 40 MG tablet TAKE ONE TABLET (40MG TOTAL) BY MOUTH DAILY AT 6PM 90 tablet 2   Coenzyme Q10 (CO Q-10) 200 MG CAPS Take by mouth daily.     ferrous sulfate 325 (65 FE) MG tablet Take 1 tablet (325 mg total) by mouth 2 (two) times daily with a meal. 60 tablet 1   losartan (COZAAR) 50 MG tablet TAKE ONE (1) TABLET BY MOUTH EVERY DAY FOR BLOOD PRESSURE 90 tablet 0   Multiple Vitamins-Minerals (CENTRUM SILVER ADULT 50+) TABS Take 1 capsule by mouth 2 (two) times daily. 30 tablet 2   No current facility-administered medications on file prior to visit.   No Known Allergies Social History   Socioeconomic History   Marital status: Married    Spouse name: Bethena Roys   Number of children: Not on file   Years of education: Not on file  Highest education level: Not on file  Occupational History   Occupation: retired  Tobacco Use   Smoking status: Former    Types: Cigarettes    Quit date: 11/21/1985    Years since quitting: 37.1   Smokeless tobacco: Never  Substance and Sexual Activity   Alcohol use: No   Drug use: No   Sexual activity: Yes  Other Topics Concern   Not on file  Social History Narrative   Not on file   Social Determinants of Health   Financial Resource Strain: Low Risk  (04/08/2022)   Overall Financial Resource Strain (CARDIA)    Difficulty of Paying Living Expenses: Not hard at all  Food Insecurity: No Food Insecurity (04/08/2022)   Hunger Vital Sign    Worried About Running Out of Food in the Last Year: Never true    Ran Out of Food in the Last Year: Never true  Transportation Needs: No Transportation Needs (04/08/2022)   PRAPARE - Hydrologist  (Medical): No    Lack of Transportation (Non-Medical): No  Physical Activity: Insufficiently Active (04/08/2022)   Exercise Vital Sign    Days of Exercise per Week: 7 days    Minutes of Exercise per Session: 20 min  Stress: No Stress Concern Present (04/08/2022)   [place]    Feeling of Stress : Not at all  Social Connections: [place] (04/08/2022)   Social Connection and Isolation Panel [NHANES]    Frequency of Communication with Friends and Family: More than three times a week    Frequency of Social Gatherings with Friends and Family: More than three times a week    Attends Religious Services: More than 4 times per year    Active Member of [employer] or Organizations: Yes    Attends Archivist Meetings: More than 4 times per year    Marital Status: Married  Human resources officer Violence: Not At Risk (04/08/2022)   Humiliation, Afraid, Rape, and Kick questionnaire    Fear of Current or Ex-Partner: No    Emotionally Abused: No    Physically Abused: No    Sexually Abused: No    Review of Systems  All other systems reviewed and are negative.      Objective:   Physical Exam Constitutional:      Appearance: Normal appearance. He is normal weight.  Cardiovascular:     Rate and Rhythm: Normal rate and regular rhythm. Frequent Extrasystoles are present.    Heart sounds: Normal heart sounds. No murmur heard.    No friction rub. No gallop.  Pulmonary:     Effort: Pulmonary effort is normal. No respiratory distress.     Breath sounds: Normal breath sounds. No stridor. No wheezing, rhonchi or rales.  Chest:     Chest wall: No tenderness.  Abdominal:     General: Abdomen is flat. Bowel sounds are normal.     Palpations: Abdomen is soft.  Musculoskeletal:     Right lower leg: No edema.     Left lower leg: No edema.  Neurological:     General: No focal deficit present.     Mental Status: He is alert and  oriented to person, place, and time. Mental status is at baseline.     Cranial Nerves: No cranial nerve deficit.     Sensory: Sensation is intact. No sensory deficit.     Motor: No weakness or seizure activity.  Coordination: Coordination is intact. Coordination normal.     Gait: Gait normal.     Deep Tendon Reflexes: Reflexes are normal and symmetric.           Assessment & Plan:  Other fatigue - Plan: CBC with Differential/Platelet, Lipid panel, COMPLETE METABOLIC PANEL WITH GFR, TSH  Pure hypercholesterolemia - Plan: CBC with Differential/Platelet, Lipid panel, COMPLETE METABOLIC PANEL WITH GFR, TSH  History of CVA (cerebrovascular accident)  Benign essential HTN Patient's blood pressure today is outstanding.  Given his history of stroke I will check a CBC a CMP and a lipid panel.  He is consistently taking his aspirin and his atorvastatin.  I would like to see his LDL cholesterol less than 55 if possible.  Given his fatigue I will also check a TSH to rule out any hypothyroidism and a CBC to rule out any anemia.  His last lab work in the fall did show anemia.  He has been taking iron.  His stool test was negative for blood.  Hopefully his hemoglobin will have improved at this point.  Recommended daily aerobic exercise and encouraged him to start walking more to build up his conditioning

## 2023-01-04 LAB — COMPLETE METABOLIC PANEL WITH GFR
AG Ratio: 1.9 (calc) (ref 1.0–2.5)
ALT: 24 U/L (ref 9–46)
AST: 20 U/L (ref 10–35)
Albumin: 4.1 g/dL (ref 3.6–5.1)
Alkaline phosphatase (APISO): 84 U/L (ref 35–144)
BUN: 17 mg/dL (ref 7–25)
CO2: 26 mmol/L (ref 20–32)
Calcium: 10 mg/dL (ref 8.6–10.3)
Chloride: 104 mmol/L (ref 98–110)
Creat: 1.15 mg/dL (ref 0.70–1.22)
Globulin: 2.2 g/dL (calc) (ref 1.9–3.7)
Glucose, Bld: 93 mg/dL (ref 65–99)
Potassium: 4.6 mmol/L (ref 3.5–5.3)
Sodium: 140 mmol/L (ref 135–146)
Total Bilirubin: 0.4 mg/dL (ref 0.2–1.2)
Total Protein: 6.3 g/dL (ref 6.1–8.1)
eGFR: 64 mL/min/{1.73_m2} (ref >=60)

## 2023-01-04 LAB — CBC WITH DIFFERENTIAL/PLATELET
Absolute Monocytes: 619 cells/uL (ref 200–950)
Basophils Absolute: 29 cells/uL (ref 0–200)
Basophils Relative: 0.6 %
Eosinophils Absolute: 139 cells/uL (ref 15–500)
Eosinophils Relative: 2.9 %
HCT: 32.7 % — ABNORMAL LOW (ref 38.5–50.0)
Hemoglobin: 10.7 g/dL — ABNORMAL LOW (ref 13.2–17.1)
Lymphs Abs: 955 cells/uL (ref 850–3900)
MCH: 29.1 pg (ref 27.0–33.0)
MCHC: 32.7 g/dL (ref 32.0–36.0)
MCV: 88.9 fL (ref 80.0–100.0)
MPV: 10.4 fL (ref 7.5–12.5)
Monocytes Relative: 12.9 %
Neutro Abs: 3058 cells/uL (ref 1500–7800)
Neutrophils Relative %: 63.7 %
Platelets: 214 10*3/uL (ref 140–400)
RBC: 3.68 10*6/uL — ABNORMAL LOW (ref 4.20–5.80)
RDW: 11.6 % (ref 11.0–15.0)
Total Lymphocyte: 19.9 %
WBC: 4.8 10*3/uL (ref 3.8–10.8)

## 2023-01-04 LAB — LIPID PANEL
Cholesterol: 148 mg/dL (ref <200)
HDL: 46 mg/dL (ref >=40)
LDL Cholesterol (Calc): 71 mg/dL (calc)
Non-HDL Cholesterol (Calc): 102 mg/dL (calc) (ref <130)
Total CHOL/HDL Ratio: 3.2 (calc) (ref <5.0)
Triglycerides: 214 mg/dL — ABNORMAL HIGH (ref <150)

## 2023-01-04 LAB — TSH: TSH: 1.79 mIU/L (ref 0.40–4.50)

## 2023-01-25 ENCOUNTER — Telehealth: Payer: Self-pay | Admitting: Family Medicine

## 2023-01-25 ENCOUNTER — Other Ambulatory Visit: Payer: Self-pay

## 2023-01-25 DIAGNOSIS — I10 Essential (primary) hypertension: Secondary | ICD-10-CM

## 2023-01-25 MED ORDER — LOSARTAN POTASSIUM 50 MG PO TABS: ORAL_TABLET | ORAL | 2 refills | Status: DC

## 2023-01-25 NOTE — Telephone Encounter (Signed)
Prescription Request  01/25/2023  LOV: 01/03/2023  What is the name of the medication or equipment?   losartan (COZAAR) 50 MG tablet  **NEW Rx REQUEST WITH NUMBER OF REFILLS** Have you contacted your pharmacy to request a refill? Yes   Which pharmacy would you like this sent to?  [facility], [address] [place] [address] Phone: [phone] Fax: [phone]    Patient notified that their request is being sent to the clinical staff for review and that they should receive a response within 2 business days.   Please advise pharmacist.

## 2023-01-31 DIAGNOSIS — H348112 Central retinal vein occlusion, right eye, stable: Secondary | ICD-10-CM | POA: Diagnosis not present

## 2023-01-31 DIAGNOSIS — H353122 Nonexudative age-related macular degeneration, left eye, intermediate dry stage: Secondary | ICD-10-CM | POA: Diagnosis not present

## 2023-01-31 DIAGNOSIS — H43822 Vitreomacular adhesion, left eye: Secondary | ICD-10-CM | POA: Diagnosis not present

## 2023-01-31 DIAGNOSIS — H35352 Cystoid macular degeneration, left eye: Secondary | ICD-10-CM | POA: Diagnosis not present

## 2023-01-31 DIAGNOSIS — H34832 Tributary (branch) retinal vein occlusion, left eye, with macular edema: Secondary | ICD-10-CM | POA: Diagnosis not present

## 2023-01-31 DIAGNOSIS — H4322 Crystalline deposits in vitreous body, left eye: Secondary | ICD-10-CM | POA: Diagnosis not present

## 2023-03-07 DIAGNOSIS — H4322 Crystalline deposits in vitreous body, left eye: Secondary | ICD-10-CM | POA: Diagnosis not present

## 2023-03-07 DIAGNOSIS — H34832 Tributary (branch) retinal vein occlusion, left eye, with macular edema: Secondary | ICD-10-CM | POA: Diagnosis not present

## 2023-03-07 DIAGNOSIS — H348112 Central retinal vein occlusion, right eye, stable: Secondary | ICD-10-CM | POA: Diagnosis not present

## 2023-03-07 DIAGNOSIS — H35352 Cystoid macular degeneration, left eye: Secondary | ICD-10-CM | POA: Diagnosis not present

## 2023-03-07 DIAGNOSIS — H353132 Nonexudative age-related macular degeneration, bilateral, intermediate dry stage: Secondary | ICD-10-CM | POA: Diagnosis not present

## 2023-03-07 DIAGNOSIS — H43822 Vitreomacular adhesion, left eye: Secondary | ICD-10-CM | POA: Diagnosis not present

## 2023-04-10 DIAGNOSIS — H35352 Cystoid macular degeneration, left eye: Secondary | ICD-10-CM | POA: Diagnosis not present

## 2023-04-10 DIAGNOSIS — H43822 Vitreomacular adhesion, left eye: Secondary | ICD-10-CM | POA: Diagnosis not present

## 2023-04-10 DIAGNOSIS — H353112 Nonexudative age-related macular degeneration, right eye, intermediate dry stage: Secondary | ICD-10-CM | POA: Diagnosis not present

## 2023-04-10 DIAGNOSIS — H34832 Tributary (branch) retinal vein occlusion, left eye, with macular edema: Secondary | ICD-10-CM | POA: Diagnosis not present

## 2023-04-10 DIAGNOSIS — H353122 Nonexudative age-related macular degeneration, left eye, intermediate dry stage: Secondary | ICD-10-CM | POA: Diagnosis not present

## 2023-04-10 DIAGNOSIS — H348112 Central retinal vein occlusion, right eye, stable: Secondary | ICD-10-CM | POA: Diagnosis not present

## 2023-04-13 ENCOUNTER — Ambulatory Visit: Payer: Medicare Other

## 2023-04-13 VITALS — Ht 66.0 in | Wt 158.0 lb

## 2023-04-13 DIAGNOSIS — Z0000 Encounter for general adult medical examination without abnormal findings: Secondary | ICD-10-CM

## 2023-04-13 NOTE — Patient Instructions (Signed)
Devin Valdez , Thank you for taking time to come for your Medicare Wellness Visit. I appreciate your ongoing commitment to your health goals. Please review the following plan we discussed and let me know if I can assist you in the future.   These are the goals we discussed:  Goals      Prevent falls     Use a cane when walking on uneven surfaces, try chair exercises to strengthen your legs        This is a list of the screening recommended for you and due dates:  Health Maintenance  Topic Date Due   DTaP/Tdap/Td vaccine (1 - Tdap) Never done   Zoster (Shingles) Vaccine (1 of 2) Never done   Pneumonia Vaccine (1 of 1 - PCV) Never done   COVID-19 Vaccine (3 - 2023-24 season) 07/22/2022   Flu Shot  06/22/2023   Medicare Annual Wellness Visit  04/12/2024   HPV Vaccine  Aged Out    Advanced directives: Information on Advanced Care Planning can be found at [facility] Advance Health Care Directives Advance Health Care Directives ([email or web address]  Please bring a copy of your health care power of attorney and living will to the office to be added to your chart at your convenience.   Conditions/risks identified: Aim for 30 minutes of exercise or brisk walking, 6-8 glasses of water, and 5 servings of fruits and vegetables each day.  Next appointment: Follow up in one year for your annual wellness visit.   Preventive Care 47 Years and Older, Male  Preventive care refers to lifestyle choices and visits with your health care provider that can promote health and wellness. What does preventive care include? A yearly physical exam. This is also called an annual well check. Dental exams once or twice a year. Routine eye exams. Ask your health care provider how often you should have your eyes checked. Personal lifestyle choices, including: Daily care of your teeth and gums. Regular physical activity. Eating a healthy diet. Avoiding tobacco and drug use. Limiting alcohol  use. Practicing safe sex. Taking low doses of aspirin every day. Taking vitamin and mineral supplements as recommended by your health care provider. What happens during an annual well check? The services and screenings done by your health care provider during your annual well check will depend on your age, overall health, lifestyle risk factors, and family history of disease. Counseling  Your health care provider may ask you questions about your: Alcohol use. Tobacco use. Drug use. Emotional well-being. Home and relationship well-being. Sexual activity. Eating habits. History of falls. Memory and ability to understand (cognition). Work and work Astronomer. Screening  You may have the following tests or measurements: Height, weight, and BMI. Blood pressure. Lipid and cholesterol levels. These may be checked every 5 years, or more frequently if you are over 16 years old. Skin check. Lung cancer screening. You may have this screening every year starting at age 58 if you have a 30-pack-year history of smoking and currently smoke or have quit within the past 15 years. Fecal occult blood test (FOBT) of the stool. You may have this test every year starting at age 68. Flexible sigmoidoscopy or colonoscopy. You may have a sigmoidoscopy every 5 years or a colonoscopy every 10 years starting at age 67. Prostate cancer screening. Recommendations will vary depending on your family history and other risks. Hepatitis C blood test. Hepatitis B blood test. Sexually transmitted disease (STD) testing. Diabetes screening. This is  done by checking your blood sugar (glucose) after you have not eaten for a while (fasting). You may have this done every 1-3 years. Abdominal aortic aneurysm (AAA) screening. You may need this if you are a current or former smoker. Osteoporosis. You may be screened starting at age 31 if you are at high risk. Talk with your health care provider about your test results,  treatment options, and if necessary, the need for more tests. Vaccines  Your health care provider may recommend certain vaccines, such as: Influenza vaccine. This is recommended every year. Tetanus, diphtheria, and acellular pertussis (Tdap, Td) vaccine. You may need a Td booster every 10 years. Zoster vaccine. You may need this after age 67. Pneumococcal 13-valent conjugate (PCV13) vaccine. One dose is recommended after age 19. Pneumococcal polysaccharide (PPSV23) vaccine. One dose is recommended after age 38. Talk to your health care provider about which screenings and vaccines you need and how often you need them. This information is not intended to replace advice given to you by your health care provider. Make sure you discuss any questions you have with your health care provider. Document Released: 12/04/2015 Document Revised: 07/27/2016 Document Reviewed: 09/08/2015 Elsevier Interactive Patient Education  2017 [employer].  Fall Prevention in the Home Falls can cause injuries. They can happen to people of all ages. There are many things you can do to make your home safe and to help prevent falls. What can I do on the outside of my home? Regularly fix the edges of walkways and driveways and fix any cracks. Remove anything that might make you trip as you walk through a door, such as a raised step or threshold. Trim any bushes or trees on the path to your home. Use bright outdoor lighting. Clear any walking paths of anything that might make someone trip, such as rocks or tools. Regularly check to see if handrails are loose or broken. Make sure that both sides of any steps have handrails. Any raised decks and porches should have guardrails on the edges. Have any leaves, snow, or ice cleared regularly. Use sand or salt on walking paths during winter. Clean up any spills in your garage right away. This includes oil or grease spills. What can I do in the bathroom? Use night  lights. Install grab bars by the toilet and in the tub and shower. Do not use towel bars as grab bars. Use non-skid mats or decals in the tub or shower. If you need to sit down in the shower, use a plastic, non-slip stool. Keep the floor dry. Clean up any water that spills on the floor as soon as it happens. Remove soap buildup in the tub or shower regularly. Attach bath mats securely with double-sided non-slip rug tape. Do not have throw rugs and other things on the floor that can make you trip. What can I do in the bedroom? Use night lights. Make sure that you have a light by your bed that is easy to reach. Do not use any sheets or blankets that are too big for your bed. They should not hang down onto the floor. Have a firm chair that has side arms. You can use this for support while you get dressed. Do not have throw rugs and other things on the floor that can make you trip. What can I do in the kitchen? Clean up any spills right away. Avoid walking on wet floors. Keep items that you use a lot in easy-to-reach places. If you need  to reach something above you, use a strong step stool that has a grab bar. Keep electrical cords out of the way. Do not use floor polish or wax that makes floors slippery. If you must use wax, use non-skid floor wax. Do not have throw rugs and other things on the floor that can make you trip. What can I do with my stairs? Do not leave any items on the stairs. Make sure that there are handrails on both sides of the stairs and use them. Fix handrails that are broken or loose. Make sure that handrails are as long as the stairways. Check any carpeting to make sure that it is firmly attached to the stairs. Fix any carpet that is loose or worn. Avoid having throw rugs at the top or bottom of the stairs. If you do have throw rugs, attach them to the floor with carpet tape. Make sure that you have a light switch at the top of the stairs and the bottom of the stairs. If  you do not have them, ask someone to add them for you. What else can I do to help prevent falls? Wear shoes that: Do not have high heels. Have rubber bottoms. Are comfortable and fit you well. Are closed at the toe. Do not wear sandals. If you use a stepladder: Make sure that it is fully opened. Do not climb a closed stepladder. Make sure that both sides of the stepladder are locked into place. Ask someone to hold it for you, if possible. Clearly mark and make sure that you can see: Any grab bars or handrails. First and last steps. Where the edge of each step is. Use tools that help you move around (mobility aids) if they are needed. These include: Canes. Walkers. Scooters. Crutches. Turn on the lights when you go into a dark area. Replace any light bulbs as soon as they burn out. Set up your furniture so you have a clear path. Avoid moving your furniture around. If any of your floors are uneven, fix them. If there are any pets around you, be aware of where they are. Review your medicines with your doctor. Some medicines can make you feel dizzy. This can increase your chance of falling. Ask your doctor what other things that you can do to help prevent falls. This information is not intended to replace advice given to you by your health care provider. Make sure you discuss any questions you have with your health care provider. Document Released: 09/03/2009 Document Revised: 04/14/2016 Document Reviewed: 12/12/2014 Elsevier Interactive Patient Education  2017 [employer].

## 2023-04-13 NOTE — Progress Notes (Signed)
Subjective:   Devin Valdez is a 82 y.o. male who presents for Medicare Annual/Subsequent preventive examination.  I connected with  Devin Valdez on 04/13/23 by a audio enabled telemedicine application and verified that I am speaking with the correct person using two identifiers.  Patient Location: Home  Provider Location: Office/Clinic  I discussed the limitations of evaluation and management by telemedicine. The patient expressed understanding and agreed to proceed.  Review of Systems     Cardiac Risk Factors include: advanced age (>39men, >52 women);dyslipidemia;hypertension;male gender     Objective:    Today's Vitals   04/13/23 1148  Weight: 158 lb (71.7 kg)  Height: 5\' 6"  (1.676 m)   Body mass index is 25.5 kg/m.     04/13/2023   11:49 AM 04/24/2022   12:29 PM 04/08/2022   11:09 AM 06/12/2020    1:28 PM 01/23/2020    9:37 AM 01/23/2020    9:26 AM 12/27/2019    1:21 PM  Advanced Directives  Does Patient Have a Medical Advance Directive? No No No No No No No  Would patient like information on creating a medical advance directive? Yes (MAU/Ambulatory/Procedural Areas - Information given) No - Patient declined No - Patient declined No - Patient declined   Yes (MAU/Ambulatory/Procedural Areas - Information given)    Current Medications (verified) Outpatient Encounter Medications as of 04/13/2023  Medication Sig   acetaminophen (TYLENOL) 325 MG tablet Take 650 mg by mouth every 6 (six) hours as needed for mild pain or headache.   Ascorbic Acid (VITAMIN C) 1000 MG tablet Take 1,000 mg by mouth daily.   aspirin 81 MG chewable tablet Chew by mouth daily.   atorvastatin (LIPITOR) 40 MG tablet TAKE ONE TABLET (40MG  TOTAL) BY MOUTH DAILY AT 6PM   Coenzyme Q10 (CO Q-10) 200 MG CAPS Take by mouth daily.   ferrous sulfate 325 (65 FE) MG tablet Take 1 tablet (325 mg total) by mouth 2 (two) times daily with a meal.   losartan (COZAAR) 50 MG tablet TAKE ONE (1) TABLET BY MOUTH  EVERY DAY FOR BLOOD PRESSURE   Multiple Vitamins-Minerals (CENTRUM SILVER ADULT 50+) TABS Take 1 capsule by mouth 2 (two) times daily.   No facility-administered encounter medications on file as of 04/13/2023.    Allergies (verified) Patient has no known allergies.   History: Past Medical History:  Diagnosis Date   Anemia    Anxiety    Cervical stenosis of spinal canal    mri 8/19- severe   Depression    Falls    Hallucinations    Hyperlipidemia    Hypertension    Stroke [facility])    left paracentral pons infarct   Past Surgical History:  Procedure Laterality Date   Carotid Dopplers  12/2019   CVA evaluation: Bilateral ICA <40%.  Bilateral vertebral arteries with normal flow.  Bilateral subclavian arteries are normal flow.   EYE SURGERY     HERNIA REPAIR     POSTERIOR CERVICAL LAMINECTOMY     with fusion C3-C7 10/19- Dr. Leeanne Rio at [facility] ECHOCARDIOGRAM  11/2019   (CVA evaluation) EF 55 to 60%.  GR 1 DD.  No RWMA.  Normal atrial sizes.  Mild aortic valve sclerosis otherwise normal valves.   Family History  Problem Relation Age of Onset   Multiple sclerosis Son    Bipolar disorder Son    Social History   Socioeconomic History   Marital status: Married    Spouse name:  Devin Valdez   Number of children: Not on file   Years of education: Not on file   Highest education level: Not on file  Occupational History   Occupation: retired  Tobacco Use   Smoking status: Former    Types: Cigarettes    Quit date: 11/21/1985    Years since quitting: 37.4   Smokeless tobacco: Never  Substance and Sexual Activity   Alcohol use: No   Drug use: No   Sexual activity: Yes  Other Topics Concern   Not on file  Social History Narrative   Not on file   Social Determinants of Health   Financial Resource Strain: Low Risk  (04/13/2023)   Overall Financial Resource Strain (CARDIA)    Difficulty of Paying Living Expenses: Not hard at all  Food Insecurity: No Food Insecurity  (04/13/2023)   Hunger Vital Sign    Worried About Running Out of Food in the Last Year: Never true    Ran Out of Food in the Last Year: Never true  Transportation Needs: No Transportation Needs (04/13/2023)   PRAPARE - Administrator, Civil Service (Medical): No    Lack of Transportation (Non-Medical): No  Physical Activity: Sufficiently Active (04/13/2023)   Exercise Vital Sign    Days of Exercise per Week: 5 days    Minutes of Exercise per Session: 30 min  Stress: No Stress Concern Present (04/13/2023)   [employer] of Occupational Health - Occupational Stress Questionnaire    Feeling of Stress : Not at all  Social Connections: Socially Integrated (04/13/2023)   Social Connection and Isolation Panel [NHANES]    Frequency of Communication with Friends and Family: More than three times a week    Frequency of Social Gatherings with Friends and Family: Three times a week    Attends Religious Services: More than 4 times per year    Active Member of Clubs or Organizations: Yes    Attends Engineer, structural: More than 4 times per year    Marital Status: Married    Tobacco Counseling Counseling given: Not Answered   Clinical Intake:  Pre-visit preparation completed: Yes  Pain : No/denies pain  Diabetes: No  How often do you need to have someone help you when you read instructions, pamphlets, or other written materials from your doctor or pharmacy?: 1 - Never  Diabetic?No   Interpreter Needed?: No  Information entered by :: Kandis Fantasia LPN   Activities of Daily Living    04/13/2023   11:49 AM  In your present state of health, do you have any difficulty performing the following activities:  Hearing? 0  Vision? 0  Difficulty concentrating or making decisions? 0  Walking or climbing stairs? 1  Dressing or bathing? 0  Doing errands, shopping? 1  Preparing Food and eating ? N  Using the Toilet? N  In the past six months, have you accidently  leaked urine? N  Do you have problems with loss of bowel control? N  Managing your Medications? N  Managing your Finances? N  Housekeeping or managing your Housekeeping? Y    Patient Care Team: Donita Brooks, MD as PCP - General (Family Medicine) Marykay Lex, MD as PCP - Cardiology (Cardiology) Erroll Luna, [facility] as Pharmacist (Pharmacist) Luciana Axe Alford Highland, MD as Consulting Physician (Ophthalmology) Sallye Lat, MD as Consulting Physician (Ophthalmology)  Indicate any recent Medical Services you may have received from other than [facility] providers in the past year (date may be  approximate).     Assessment:   This is a routine wellness examination for Shakeel.  Hearing/Vision screen Hearing Screening - Comments:: Denies hearing difficulties   Vision Screening - Comments:: Wears rx glasses - up to date with routine eye exams with Dr. Luciana Axe    Dietary issues and exercise activities discussed: Current Exercise Habits: Home exercise routine, Type of exercise: walking;stretching, Time (Minutes): 30, Frequency (Times/Week): 5, Weekly Exercise (Minutes/Week): 150, Intensity: Mild   Goals Addressed             This Visit's Progress    COMPLETED: Track and Manage My Blood Pressure-Hypertension       Timeframe:  Long-Range Goal Priority:  High Start Date:  06/25/21                           Expected End Date: 12/26/21                      Follow Up Date 10/20/21    - check blood pressure 3 times per week - choose a place to take my blood pressure (home, clinic or office, retail store) - write blood pressure results in a log or diary    Why is this important?   You won't feel high blood pressure, but it can still hurt your blood vessels.  High blood pressure can cause heart or kidney problems. It can also cause a stroke.  Making lifestyle changes like losing a little weight or eating less salt will help.  Checking your blood pressure at home and at different  times of the day can help to control blood pressure.  If the doctor prescribes medicine remember to take it the way the doctor ordered.  Call the office if you cannot afford the medicine or if there are questions about it.     Notes:        Depression Screen    04/13/2023   11:46 AM 04/08/2022   11:08 AM 04/07/2021   12:58 PM 05/09/2019    8:16 AM 06/12/2018    9:47 AM  PHQ 2/9 Scores  PHQ - 2 Score 0 0 0 2 0  PHQ- 9 Score    3     Fall Risk    04/13/2023   11:49 AM 04/08/2022   11:05 AM 04/07/2021   12:58 PM 12/18/2020    2:06 PM 05/09/2019    8:16 AM  Fall Risk   Falls in the past year? 1 0 0 0 0  Number falls in past yr: 0 0 0 0   Injury with Fall? 0 0 0 0   Risk for fall due to : History of fall(s);Impaired balance/gait;Impaired mobility Other (Comment);Impaired balance/gait  No Fall Risks   Risk for fall due to: Comment  hx of stroke     Follow up Falls prevention discussed;Education provided;Falls evaluation completed Falls prevention discussed  Falls evaluation completed Falls evaluation completed    FALL RISK PREVENTION PERTAINING TO THE HOME:  Any stairs in or around the home? No  If so, are there any without handrails? No  Home free of loose throw rugs in walkways, pet beds, electrical cords, etc? Yes  Adequate lighting in your home to reduce risk of falls? Yes   ASSISTIVE DEVICES UTILIZED TO PREVENT FALLS:  Life alert? No  Use of a cane, walker or w/c? Yes  Grab bars in the bathroom? Yes  Shower chair or bench in  shower? No  Elevated toilet seat or a handicapped toilet? Yes   TIMED UP AND GO:  Was the test performed? No . Telephonic visit   Cognitive Function:        04/13/2023   11:49 AM 04/08/2022   11:12 AM 04/07/2021   12:59 PM  6CIT Screen  What Year? 0 points 0 points 0 points  What month? 0 points 0 points 0 points  What time? 0 points 0 points 0 points  Count back from 20 0 points 0 points 0 points  Months in reverse 2 points 0 points 0  points  Repeat phrase 0 points 0 points 0 points  Total Score 2 points 0 points 0 points    Immunizations Immunization History  Administered Date(s) Administered   Fluad Quad(high Dose 65+) 08/06/2019   Moderna SARS-COV2 Booster Vaccination 01/03/2020   Moderna Sars-Covid-2 Vaccination 12/06/2019    TDAP status: Due, Education has been provided regarding the importance of this vaccine. Advised may receive this vaccine at local pharmacy or Health Dept. Aware to provide a copy of the vaccination record if obtained from local pharmacy or Health Dept. Verbalized acceptance and understanding.  Pneumococcal vaccine status: Declined,  Education has been provided regarding the importance of this vaccine but patient still declined. Advised may receive this vaccine at local pharmacy or Health Dept. Aware to provide a copy of the vaccination record if obtained from local pharmacy or Health Dept. Verbalized acceptance and understanding.   Covid-19 vaccine status: Declined, Education has been provided regarding the importance of this vaccine but patient still declined. Advised may receive this vaccine at local pharmacy or Health Dept.or vaccine clinic. Aware to provide a copy of the vaccination record if obtained from local pharmacy or Health Dept. Verbalized acceptance and understanding.  Qualifies for Shingles Vaccine? Yes   Zostavax completed No   Shingrix Completed?: No.    Education has been provided regarding the importance of this vaccine. Patient has been advised to call insurance company to determine out of pocket expense if they have not yet received this vaccine. Advised may also receive vaccine at local pharmacy or Health Dept. Verbalized acceptance and understanding.  Screening Tests Health Maintenance  Topic Date Due   DTaP/Tdap/Td (1 - Tdap) Never done   Zoster Vaccines- Shingrix (1 of 2) Never done   Pneumonia Vaccine 77+ Years old (1 of 1 - PCV) Never done   COVID-19 Vaccine (3 -  2023-24 season) 07/22/2022   INFLUENZA VACCINE  06/22/2023   Medicare Annual Wellness (AWV)  04/12/2024   HPV VACCINES  Aged Out    Health Maintenance  Health Maintenance Due  Topic Date Due   DTaP/Tdap/Td (1 - Tdap) Never done   Zoster Vaccines- Shingrix (1 of 2) Never done   Pneumonia Vaccine 34+ Years old (1 of 1 - PCV) Never done   COVID-19 Vaccine (3 - 2023-24 season) 07/22/2022    Colorectal cancer screening: No longer required.   Lung Cancer Screening: (Low Dose CT Chest recommended if Age 57-80 years, 30 pack-year currently smoking OR have quit w/in 15years.) does not qualify.   Lung Cancer Screening Referral: n/a  Additional Screening:  Hepatitis C Screening: does not qualify;   Vision Screening: Recommended annual ophthalmology exams for early detection of glaucoma and other disorders of the eye. Is the patient up to date with their annual eye exam?  Yes  Who is the provider or what is the name of the office in which the patient attends  annual eye exams? Dr. Luciana Axe  If pt is not established with a provider, would they like to be referred to a provider to establish care? No .   Dental Screening: Recommended annual dental exams for proper oral hygiene  Community Resource Referral / Chronic Care Management: CRR required this visit?  No   CCM required this visit?  No      Plan:     I have personally reviewed and noted the following in the patient's chart:   Medical and social history Use of alcohol, tobacco or illicit drugs  Current medications and supplements including opioid prescriptions. Patient is not currently taking opioid prescriptions. Functional ability and status Nutritional status Physical activity Advanced directives List of other physicians Hospitalizations, surgeries, and ER visits in previous 12 months Vitals Screenings to include cognitive, depression, and falls Referrals and appointments  In addition, I have reviewed and discussed with  patient certain preventive protocols, quality metrics, and best practice recommendations. A written personalized care plan for preventive services as well as general preventive health recommendations were provided to patient.     Durwin Nora, [place]   1/61/0960   Due to this being a virtual visit, the after visit summary with patients personalized plan was offered to patient via mail or my-chart. per request, patient was mailed a copy of AVS  Nurse Notes: No concerns

## 2023-05-15 DIAGNOSIS — H35352 Cystoid macular degeneration, left eye: Secondary | ICD-10-CM | POA: Diagnosis not present

## 2023-05-15 DIAGNOSIS — H4322 Crystalline deposits in vitreous body, left eye: Secondary | ICD-10-CM | POA: Diagnosis not present

## 2023-05-15 DIAGNOSIS — H43822 Vitreomacular adhesion, left eye: Secondary | ICD-10-CM | POA: Diagnosis not present

## 2023-05-15 DIAGNOSIS — H348112 Central retinal vein occlusion, right eye, stable: Secondary | ICD-10-CM | POA: Diagnosis not present

## 2023-05-15 DIAGNOSIS — H353132 Nonexudative age-related macular degeneration, bilateral, intermediate dry stage: Secondary | ICD-10-CM | POA: Diagnosis not present

## 2023-05-15 DIAGNOSIS — H34832 Tributary (branch) retinal vein occlusion, left eye, with macular edema: Secondary | ICD-10-CM | POA: Diagnosis not present

## 2023-06-07 ENCOUNTER — Other Ambulatory Visit: Payer: Self-pay | Admitting: Family Medicine

## 2023-07-11 DIAGNOSIS — H35352 Cystoid macular degeneration, left eye: Secondary | ICD-10-CM | POA: Diagnosis not present

## 2023-07-11 DIAGNOSIS — H4322 Crystalline deposits in vitreous body, left eye: Secondary | ICD-10-CM | POA: Diagnosis not present

## 2023-07-11 DIAGNOSIS — H34832 Tributary (branch) retinal vein occlusion, left eye, with macular edema: Secondary | ICD-10-CM | POA: Diagnosis not present

## 2023-07-11 DIAGNOSIS — H43822 Vitreomacular adhesion, left eye: Secondary | ICD-10-CM | POA: Diagnosis not present

## 2023-07-11 DIAGNOSIS — H353132 Nonexudative age-related macular degeneration, bilateral, intermediate dry stage: Secondary | ICD-10-CM | POA: Diagnosis not present

## 2023-07-11 DIAGNOSIS — H348112 Central retinal vein occlusion, right eye, stable: Secondary | ICD-10-CM | POA: Diagnosis not present

## 2023-08-15 DIAGNOSIS — H35352 Cystoid macular degeneration, left eye: Secondary | ICD-10-CM | POA: Diagnosis not present

## 2023-08-15 DIAGNOSIS — H353122 Nonexudative age-related macular degeneration, left eye, intermediate dry stage: Secondary | ICD-10-CM | POA: Diagnosis not present

## 2023-08-15 DIAGNOSIS — H348112 Central retinal vein occlusion, right eye, stable: Secondary | ICD-10-CM | POA: Diagnosis not present

## 2023-08-15 DIAGNOSIS — H43822 Vitreomacular adhesion, left eye: Secondary | ICD-10-CM | POA: Diagnosis not present

## 2023-08-15 DIAGNOSIS — H34832 Tributary (branch) retinal vein occlusion, left eye, with macular edema: Secondary | ICD-10-CM | POA: Diagnosis not present

## 2023-08-15 DIAGNOSIS — H353112 Nonexudative age-related macular degeneration, right eye, intermediate dry stage: Secondary | ICD-10-CM | POA: Diagnosis not present

## 2023-08-15 DIAGNOSIS — H4322 Crystalline deposits in vitreous body, left eye: Secondary | ICD-10-CM | POA: Diagnosis not present

## 2023-08-25 ENCOUNTER — Other Ambulatory Visit: Payer: Self-pay | Admitting: Family Medicine

## 2023-08-25 NOTE — Telephone Encounter (Signed)
OV 01/03/23 Requested Prescriptions  Pending Prescriptions Disp Refills   atorvastatin (LIPITOR) 40 MG tablet [Pharmacy Med Name: ATORVASTATIN CALCIUM 40 MG TAB] 90 tablet 0    Sig: TAKE ONE TABLET (40MG  TOTAL) BY MOUTH DAILY AT 6PM     Cardiovascular:  Antilipid - Statins Failed - 08/25/2023  1:30 PM      Failed - Valid encounter within last 12 months    Recent Outpatient Visits           1 year ago Pure hypercholesterolemia   [facility] Pickard, Priscille Heidelberg, MD   2 years ago Pure hypercholesterolemia   [facility] Pickard, Priscille Heidelberg, MD   2 years ago Encounter for initial annual wellness visit in Medicare patient   [facility] Valentino Nose, NP   2 years ago History of stroke   [facility] Pickard, Priscille Heidelberg, MD   3 years ago Bilateral carpal tunnel syndrome   [facility] Donita Brooks, MD              Failed - Lipid Panel in normal range within the last 12 months    Cholesterol  Date Value Ref Range Status  01/03/2023 148 <200 mg/dL Final   LDL Cholesterol (Calc)  Date Value Ref Range Status  01/03/2023 71 mg/dL (calc) Final    Comment:    Reference range: <100 . Desirable range <100 mg/dL for primary prevention;   <70 mg/dL for patients with CHD or diabetic patients  with > or = 2 CHD risk factors. Marland Kitchen LDL-C is now calculated using the Martin-Hopkins  calculation, which is a validated novel method providing  better accuracy than the Friedewald equation in the  estimation of LDL-C.  Horald Pollen et al. Lenox Ahr. 6213;086(57): 2061-2068  (http://education.[facility].com/faq/FAQ164)    HDL  Date Value Ref Range Status  01/03/2023 46 > OR = 40 mg/dL Final   Triglycerides  Date Value Ref Range Status  01/03/2023 214 (H) <150 mg/dL Final    Comment:    . If a non-fasting specimen was collected, consider repeat triglyceride testing on a fasting specimen if clinically  indicated.  Perry Mount et al. J. of Clin. Lipidol. 2015;9:129-169. Marland Kitchen          Passed - Patient is not pregnant

## 2023-09-19 DIAGNOSIS — H348112 Central retinal vein occlusion, right eye, stable: Secondary | ICD-10-CM | POA: Diagnosis not present

## 2023-09-19 DIAGNOSIS — H353112 Nonexudative age-related macular degeneration, right eye, intermediate dry stage: Secondary | ICD-10-CM | POA: Diagnosis not present

## 2023-09-19 DIAGNOSIS — H353122 Nonexudative age-related macular degeneration, left eye, intermediate dry stage: Secondary | ICD-10-CM | POA: Diagnosis not present

## 2023-09-19 DIAGNOSIS — H35352 Cystoid macular degeneration, left eye: Secondary | ICD-10-CM | POA: Diagnosis not present

## 2023-09-19 DIAGNOSIS — H43822 Vitreomacular adhesion, left eye: Secondary | ICD-10-CM | POA: Diagnosis not present

## 2023-09-19 DIAGNOSIS — H4322 Crystalline deposits in vitreous body, left eye: Secondary | ICD-10-CM | POA: Diagnosis not present

## 2023-09-19 DIAGNOSIS — H34832 Tributary (branch) retinal vein occlusion, left eye, with macular edema: Secondary | ICD-10-CM | POA: Diagnosis not present

## 2023-10-24 DIAGNOSIS — H4322 Crystalline deposits in vitreous body, left eye: Secondary | ICD-10-CM | POA: Diagnosis not present

## 2023-10-24 DIAGNOSIS — H35352 Cystoid macular degeneration, left eye: Secondary | ICD-10-CM | POA: Diagnosis not present

## 2023-10-24 DIAGNOSIS — H34832 Tributary (branch) retinal vein occlusion, left eye, with macular edema: Secondary | ICD-10-CM | POA: Diagnosis not present

## 2023-10-24 DIAGNOSIS — H353112 Nonexudative age-related macular degeneration, right eye, intermediate dry stage: Secondary | ICD-10-CM | POA: Diagnosis not present

## 2023-10-24 DIAGNOSIS — H43822 Vitreomacular adhesion, left eye: Secondary | ICD-10-CM | POA: Diagnosis not present

## 2023-10-24 DIAGNOSIS — H353122 Nonexudative age-related macular degeneration, left eye, intermediate dry stage: Secondary | ICD-10-CM | POA: Diagnosis not present

## 2023-10-24 DIAGNOSIS — H348112 Central retinal vein occlusion, right eye, stable: Secondary | ICD-10-CM | POA: Diagnosis not present

## 2023-11-01 ENCOUNTER — Other Ambulatory Visit: Payer: Self-pay | Admitting: Family Medicine

## 2023-11-01 ENCOUNTER — Other Ambulatory Visit: Payer: Self-pay

## 2023-11-01 DIAGNOSIS — I10 Essential (primary) hypertension: Secondary | ICD-10-CM

## 2023-11-01 MED ORDER — LOSARTAN POTASSIUM 50 MG PO TABS: ORAL_TABLET | ORAL | 0 refills | Status: DC

## 2023-11-17 ENCOUNTER — Ambulatory Visit (INDEPENDENT_AMBULATORY_CARE_PROVIDER_SITE_OTHER): Payer: Medicare Other | Admitting: Family Medicine

## 2023-11-17 ENCOUNTER — Encounter: Payer: Self-pay | Admitting: Family Medicine

## 2023-11-17 VITALS — BP 120/72 | HR 70 | Temp 98.4°F | Ht 66.0 in | Wt 162.0 lb

## 2023-11-17 DIAGNOSIS — E7800 Pure hypercholesterolemia, unspecified: Secondary | ICD-10-CM

## 2023-11-17 DIAGNOSIS — I10 Essential (primary) hypertension: Secondary | ICD-10-CM | POA: Diagnosis not present

## 2023-11-17 DIAGNOSIS — R5383 Other fatigue: Secondary | ICD-10-CM

## 2023-11-17 DIAGNOSIS — Z8673 Personal history of transient ischemic attack (TIA), and cerebral infarction without residual deficits: Secondary | ICD-10-CM

## 2023-11-17 NOTE — Progress Notes (Signed)
Subjective:    Patient ID: Devin Valdez, male    DOB: Apr 13, 1941, 82 y.o.   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  He also has a history of cervical spinal stenosis.   Wt Readings from Last 3 Encounters:  11/17/23 162 lb (73.5 kg)  04/13/23 158 lb (71.7 kg)  01/03/23 158 lb 3.2 oz (71.8 kg)  Wife is concerned because she states he has very little energy.  She states that his get up and go has got up and left.  He primarily just wants to sit around.  He denies depression.  They both deny memory loss.  Specifically he denies any chest pain or shortness of breath or dyspnea on exertion.  The patient has a flat affect.  He also has a slow shuffling gait.  His shuffling gait has been present ever since his stroke.  I question the patient if his wife has noticed any tremor.  She denies any pill-rolling tremor although she is does occasionally describe what sounds like an essential tremor.  I am concerned that the patient may be demonstrating some mild Parkinson features.  However at the present time there is no obvious abnormalities on his exam  Past Medical History:  Diagnosis Date   Anemia    Anxiety    Cervical stenosis of spinal canal    mri 8/19- severe   Depression    Falls    Hallucinations    Hyperlipidemia    Hypertension    Stroke [facility])    left paracentral pons infarct   Past Surgical History:  Procedure Laterality Date   Carotid Dopplers  12/2019   CVA evaluation: Bilateral ICA <40%.  Bilateral vertebral arteries with normal flow.  Bilateral subclavian arteries are normal flow.   EYE SURGERY     HERNIA REPAIR     POSTERIOR CERVICAL LAMINECTOMY     with fusion C3-C7 10/19- Dr. Leeanne Rio at [facility] ECHOCARDIOGRAM  11/2019   (CVA evaluation) EF 55 to 60%.  GR 1 DD.  No RWMA.  Normal atrial sizes.  Mild aortic valve sclerosis otherwise normal valves.    Current Outpatient Medications on File Prior to Visit  Medication Sig Dispense Refill   acetaminophen (TYLENOL) 325 MG tablet Take 650 mg by mouth every 6 (six) hours as needed for mild pain or headache.     Ascorbic Acid (VITAMIN C) 1000 MG tablet Take 1,000 mg by mouth daily.     aspirin 81 MG chewable tablet Chew by mouth daily.     atorvastatin (LIPITOR) 40 MG tablet TAKE ONE TABLET (40MG  TOTAL) BY MOUTH DAILY AT 6PM 90 tablet 0   Coenzyme Q10 (CO Q-10) 200 MG CAPS Take by mouth daily.     ferrous sulfate 325 (65 FE) MG tablet Take 1 tablet (325 mg total) by mouth 2 (two) times daily with a meal. 60 tablet 1   losartan (COZAAR) 50 MG tablet TAKE ONE (1) TABLET BY MOUTH EVERY DAY FOR BLOOD PRESSURE 30 tablet 0   Multiple Vitamins-Minerals (CENTRUM SILVER ADULT 50+) TABS Take 1 capsule by mouth 2 (two) times daily. 30 tablet 2   No current facility-administered medications on file prior to visit.   No Known Allergies Social History   Socioeconomic History   Marital status: Married    Spouse name: Darel Hong   Number of children: Not on file   Years of  education: Not on file   Highest education level: Not on file  Occupational History   Occupation: retired  Tobacco Use   Smoking status: Former    Current packs/day: 0.00    Types: Cigarettes    Quit date: 11/21/1985    Years since quitting: 38.0   Smokeless tobacco: Never  Substance and Sexual Activity   Alcohol use: No   Drug use: No   Sexual activity: Yes  Other Topics Concern   Not on file  Social History Narrative   Not on file   Social Drivers of Health   Financial Resource Strain: Low Risk  (04/13/2023)   Overall Financial Resource Strain (CARDIA)    Difficulty of Paying Living Expenses: Not hard at all  Food Insecurity: No Food Insecurity (04/13/2023)   Hunger Vital Sign    Worried About Running Out of Food in the Last Year: Never true    Ran Out of Food in the Last Year: Never true  Transportation Needs: No  Transportation Needs (04/13/2023)   PRAPARE - Administrator, Civil Service (Medical): No    Lack of Transportation (Non-Medical): No  Physical Activity: Sufficiently Active (04/13/2023)   Exercise Vital Sign    Days of Exercise per Week: 5 days    Minutes of Exercise per Session: 30 min  Stress: No Stress Concern Present (04/13/2023)   [employer] of Occupational Health - Occupational Stress Questionnaire    Feeling of Stress : Not at all  Social Connections: Socially Integrated (04/13/2023)   Social Connection and Isolation Panel [NHANES]    Frequency of Communication with Friends and Family: More than three times a week    Frequency of Social Gatherings with Friends and Family: Three times a week    Attends Religious Services: More than 4 times per year    Active Member of Clubs or Organizations: Yes    Attends Banker Meetings: More than 4 times per year    Marital Status: Married  Catering manager Violence: Not At Risk (04/13/2023)   Humiliation, Afraid, Rape, and Kick questionnaire    Fear of Current or Ex-Partner: No    Emotionally Abused: No    Physically Abused: No    Sexually Abused: No    Review of Systems  All other systems reviewed and are negative.      Objective:   Physical Exam Constitutional:      Appearance: Normal appearance. He is normal weight.  Cardiovascular:     Rate and Rhythm: Normal rate and regular rhythm. Frequent Extrasystoles are present.    Heart sounds: Normal heart sounds. No murmur heard.    No friction rub. No gallop.  Pulmonary:     Effort: Pulmonary effort is normal. No respiratory distress.     Breath sounds: Normal breath sounds. No stridor. No wheezing, rhonchi or rales.  Chest:     Chest wall: No tenderness.  Abdominal:     General: Abdomen is flat. Bowel sounds are normal.     Palpations: Abdomen is soft.  Musculoskeletal:     Right lower leg: No edema.     Left lower leg: No edema.  Neurological:      General: No focal deficit present.     Mental Status: He is alert and oriented to person, place, and time. Mental status is at baseline.     Cranial Nerves: No cranial nerve deficit.     Sensory: Sensation is intact. No sensory deficit.  Motor: No weakness or seizure activity.     Coordination: Coordination is intact. Coordination normal.     Gait: Gait normal.     Deep Tendon Reflexes: Reflexes are normal and symmetric.           Assessment & Plan:  History of CVA (cerebrovascular accident) - Plan: CBC with Differential/Platelet, COMPLETE METABOLIC PANEL WITH GFR, Lipid panel  Benign essential HTN - Plan: CBC with Differential/Platelet, COMPLETE METABOLIC PANEL WITH GFR, Lipid panel  Pure hypercholesterolemia - Plan: CBC with Differential/Platelet, COMPLETE METABOLIC PANEL WITH GFR, Lipid panel  Fatigue, unspecified type - Plan: Vitamin B12, TSH Patient has a slow shuffling gait.  He has had this ever since his stroke although it seems slightly worse today.  This makes me concerned about possible parkinsonism.  The other possibility is weakness secondary to deconditioning.  He also has a flat affect.  There is an occasional tremor although there is none visible on exam today.  Therefore I have discussed this with the patient and his wife.  We will monitor for any Parkinson-like features and if present consult neurology.  At present I am has been pressure is excellent.  I will check CBC a CMP and a lipid panel and TSH and a B12.  I would like to see his LDL cholesterol less than 55.  Offer the patient a pneumonia vaccine and a flu shot today but the patient declined.

## 2023-11-18 LAB — LIPID PANEL
Cholesterol: 145 mg/dL (ref <200)
HDL: 50 mg/dL (ref >=40)
LDL Cholesterol (Calc): 74 mg/dL
Non-HDL Cholesterol (Calc): 95 mg/dL (ref <130)
Total CHOL/HDL Ratio: 2.9 (calc) (ref <5.0)
Triglycerides: 126 mg/dL (ref <150)

## 2023-11-18 LAB — COMPLETE METABOLIC PANEL WITH GFR
AG Ratio: 1.9 (calc) (ref 1.0–2.5)
ALT: 22 U/L (ref 9–46)
AST: 22 U/L (ref 10–35)
Albumin: 4.3 g/dL (ref 3.6–5.1)
Alkaline phosphatase (APISO): 89 U/L (ref 35–144)
BUN: 16 mg/dL (ref 7–25)
CO2: 23 mmol/L (ref 20–32)
Calcium: 9.6 mg/dL (ref 8.6–10.3)
Chloride: 106 mmol/L (ref 98–110)
Creat: 1.17 mg/dL (ref 0.70–1.22)
Globulin: 2.3 g/dL (ref 1.9–3.7)
Glucose, Bld: 99 mg/dL (ref 65–99)
Potassium: 4.3 mmol/L (ref 3.5–5.3)
Sodium: 142 mmol/L (ref 135–146)
Total Bilirubin: 0.6 mg/dL (ref 0.2–1.2)
Total Protein: 6.6 g/dL (ref 6.1–8.1)
eGFR: 62 mL/min/{1.73_m2} (ref >=60)

## 2023-11-18 LAB — CBC WITH DIFFERENTIAL/PLATELET
Absolute Lymphocytes: 898 {cells}/uL (ref 850–3900)
Absolute Monocytes: 581 {cells}/uL (ref 200–950)
Basophils Absolute: 19 {cells}/uL (ref 0–200)
Basophils Relative: 0.4 %
Eosinophils Absolute: 91 {cells}/uL (ref 15–500)
Eosinophils Relative: 1.9 %
HCT: 35 % — ABNORMAL LOW (ref 38.5–50.0)
Hemoglobin: 11.2 g/dL — ABNORMAL LOW (ref 13.2–17.1)
MCH: 28.1 pg (ref 27.0–33.0)
MCHC: 32 g/dL (ref 32.0–36.0)
MCV: 87.7 fL (ref 80.0–100.0)
MPV: 10.9 fL (ref 7.5–12.5)
Monocytes Relative: 12.1 %
Neutro Abs: 3211 {cells}/uL (ref 1500–7800)
Neutrophils Relative %: 66.9 %
Platelets: 184 10*3/uL (ref 140–400)
RBC: 3.99 10*6/uL — ABNORMAL LOW (ref 4.20–5.80)
RDW: 11.8 % (ref 11.0–15.0)
Total Lymphocyte: 18.7 %
WBC: 4.8 10*3/uL (ref 3.8–10.8)

## 2023-11-18 LAB — VITAMIN B12: Vitamin B-12: 1701 pg/mL — ABNORMAL HIGH (ref 200–1100)

## 2023-11-18 LAB — TSH: TSH: 1.84 m[IU]/L (ref 0.40–4.50)

## 2023-11-28 ENCOUNTER — Other Ambulatory Visit: Payer: Self-pay | Admitting: Family Medicine

## 2023-11-28 DIAGNOSIS — I10 Essential (primary) hypertension: Secondary | ICD-10-CM

## 2023-12-05 DIAGNOSIS — H348112 Central retinal vein occlusion, right eye, stable: Secondary | ICD-10-CM | POA: Diagnosis not present

## 2023-12-05 DIAGNOSIS — H43822 Vitreomacular adhesion, left eye: Secondary | ICD-10-CM | POA: Diagnosis not present

## 2023-12-05 DIAGNOSIS — H34832 Tributary (branch) retinal vein occlusion, left eye, with macular edema: Secondary | ICD-10-CM | POA: Diagnosis not present

## 2023-12-05 DIAGNOSIS — H353122 Nonexudative age-related macular degeneration, left eye, intermediate dry stage: Secondary | ICD-10-CM | POA: Diagnosis not present

## 2023-12-05 DIAGNOSIS — H353112 Nonexudative age-related macular degeneration, right eye, intermediate dry stage: Secondary | ICD-10-CM | POA: Diagnosis not present

## 2023-12-05 DIAGNOSIS — H4322 Crystalline deposits in vitreous body, left eye: Secondary | ICD-10-CM | POA: Diagnosis not present

## 2023-12-05 DIAGNOSIS — H35352 Cystoid macular degeneration, left eye: Secondary | ICD-10-CM | POA: Diagnosis not present

## 2023-12-28 ENCOUNTER — Other Ambulatory Visit: Payer: Self-pay | Admitting: Family Medicine

## 2023-12-28 DIAGNOSIS — I10 Essential (primary) hypertension: Secondary | ICD-10-CM

## 2024-01-09 DIAGNOSIS — H348112 Central retinal vein occlusion, right eye, stable: Secondary | ICD-10-CM | POA: Diagnosis not present

## 2024-01-09 DIAGNOSIS — H353132 Nonexudative age-related macular degeneration, bilateral, intermediate dry stage: Secondary | ICD-10-CM | POA: Diagnosis not present

## 2024-01-09 DIAGNOSIS — H4322 Crystalline deposits in vitreous body, left eye: Secondary | ICD-10-CM | POA: Diagnosis not present

## 2024-01-09 DIAGNOSIS — H34832 Tributary (branch) retinal vein occlusion, left eye, with macular edema: Secondary | ICD-10-CM | POA: Diagnosis not present

## 2024-01-09 DIAGNOSIS — H35352 Cystoid macular degeneration, left eye: Secondary | ICD-10-CM | POA: Diagnosis not present

## 2024-01-09 DIAGNOSIS — H43822 Vitreomacular adhesion, left eye: Secondary | ICD-10-CM | POA: Diagnosis not present

## 2024-01-26 ENCOUNTER — Other Ambulatory Visit: Payer: Self-pay | Admitting: Family Medicine

## 2024-01-26 DIAGNOSIS — I10 Essential (primary) hypertension: Secondary | ICD-10-CM

## 2024-02-20 DIAGNOSIS — H4322 Crystalline deposits in vitreous body, left eye: Secondary | ICD-10-CM | POA: Diagnosis not present

## 2024-02-20 DIAGNOSIS — H43822 Vitreomacular adhesion, left eye: Secondary | ICD-10-CM | POA: Diagnosis not present

## 2024-02-20 DIAGNOSIS — H34832 Tributary (branch) retinal vein occlusion, left eye, with macular edema: Secondary | ICD-10-CM | POA: Diagnosis not present

## 2024-02-20 DIAGNOSIS — H348112 Central retinal vein occlusion, right eye, stable: Secondary | ICD-10-CM | POA: Diagnosis not present

## 2024-02-20 DIAGNOSIS — H353112 Nonexudative age-related macular degeneration, right eye, intermediate dry stage: Secondary | ICD-10-CM | POA: Diagnosis not present

## 2024-02-20 DIAGNOSIS — H35352 Cystoid macular degeneration, left eye: Secondary | ICD-10-CM | POA: Diagnosis not present

## 2024-02-20 DIAGNOSIS — H353122 Nonexudative age-related macular degeneration, left eye, intermediate dry stage: Secondary | ICD-10-CM | POA: Diagnosis not present

## 2024-03-26 DIAGNOSIS — H34832 Tributary (branch) retinal vein occlusion, left eye, with macular edema: Secondary | ICD-10-CM | POA: Diagnosis not present

## 2024-03-26 DIAGNOSIS — H4322 Crystalline deposits in vitreous body, left eye: Secondary | ICD-10-CM | POA: Diagnosis not present

## 2024-03-26 DIAGNOSIS — H43822 Vitreomacular adhesion, left eye: Secondary | ICD-10-CM | POA: Diagnosis not present

## 2024-03-26 DIAGNOSIS — H35352 Cystoid macular degeneration, left eye: Secondary | ICD-10-CM | POA: Diagnosis not present

## 2024-03-26 DIAGNOSIS — H353112 Nonexudative age-related macular degeneration, right eye, intermediate dry stage: Secondary | ICD-10-CM | POA: Diagnosis not present

## 2024-03-26 DIAGNOSIS — H348112 Central retinal vein occlusion, right eye, stable: Secondary | ICD-10-CM | POA: Diagnosis not present

## 2024-03-26 DIAGNOSIS — H353122 Nonexudative age-related macular degeneration, left eye, intermediate dry stage: Secondary | ICD-10-CM | POA: Diagnosis not present

## 2024-04-18 ENCOUNTER — Ambulatory Visit: Payer: Medicare Other | Admitting: *Deleted

## 2024-04-18 DIAGNOSIS — Z0000 Encounter for general adult medical examination without abnormal findings: Secondary | ICD-10-CM

## 2024-04-18 NOTE — Progress Notes (Signed)
 Subjective:   Devin Valdez is a 83 y.o. male who presents for Medicare Annual/Subsequent preventive examination.  Visit Complete: Virtual I connected with  Quantarius Brendon Caller on 04/18/24 by a audio enabled telemedicine application and verified that I am speaking with the correct person using two identifiers.  Patient Location: Home  Provider Location: Home Office  I discussed the limitations of evaluation and management by telemedicine. The patient expressed understanding and agreed to proceed.  Vital Signs: Because this visit was a virtual/telehealth visit, some criteria may be missing or patient reported. Any vitals not documented were not able to be obtained and vitals that have been documented are patient reported.   Cardiac Risk Factors include: advanced age (>34men, >32 women);male gender;hypertension     Objective:     There were no vitals filed for this visit. There is no height or weight on file to calculate BMI.     04/18/2024   11:04 AM 04/13/2023   11:49 AM 04/24/2022   12:29 PM 04/08/2022   11:09 AM 06/12/2020    1:28 PM 01/23/2020    9:37 AM 01/23/2020    9:26 AM  Advanced Directives  Does Patient Have a Medical Advance Directive? No No No No No No No  Would patient like information on creating a medical advance directive? No - Patient declined Yes (MAU/Ambulatory/Procedural Areas - Information given) No - Patient declined No - Patient declined No - Patient declined  --    Valdez Medications (verified) Outpatient Encounter Medications as of 04/18/2024  Medication Sig   acetaminophen  (TYLENOL ) 325 MG tablet Take 650 mg by mouth every 6 (six) hours as needed for mild pain or headache.   aspirin  81 MG chewable tablet Chew by mouth daily.   atorvastatin  (LIPITOR ) 40 MG tablet TAKE ONE TABLET (40MG  TOTAL) BY MOUTH DAILY AT 6PM   Coenzyme Q10 (CO Q-10) 200 MG CAPS Take by mouth daily.   ferrous sulfate  325 (65 FE) MG tablet Take 1 tablet (325 mg total) by mouth 2  (two) times daily with a meal.   losartan  (COZAAR ) 50 MG tablet TAKE ONE TABLET BY MOUTH EVERY DAY FOR BLOOD PRESSURE   Multiple Vitamins-Minerals (CENTRUM SILVER  ADULT 50+) TABS Take 1 capsule by mouth 2 (two) times daily.   Ascorbic Acid (VITAMIN C) 1000 MG tablet Take 1,000 mg by mouth daily. (Patient not taking: Reported on 04/18/2024)   No facility-administered encounter medications on file as of 04/18/2024.    Allergies (verified) Patient has no known allergies.   History: Past Medical History:  Diagnosis Date   Anemia    Anxiety    Cervical stenosis of spinal canal    mri 8/19- severe   Depression    Falls    Hallucinations    Hyperlipidemia    Hypertension    Stroke [facility])    left paracentral pons infarct   Past Surgical History:  Procedure Laterality Date   Carotid Dopplers  12/2019   CVA evaluation: Bilateral ICA <40%.  Bilateral vertebral arteries with normal flow.  Bilateral subclavian arteries are normal flow.   EYE SURGERY     HERNIA REPAIR     POSTERIOR CERVICAL LAMINECTOMY     with fusion C3-C7 10/19- Dr. Clide Dalton [facility] ECHOCARDIOGRAM  11/2019   (CVA evaluation) EF 55 to 60%.  GR 1 DD.  No RWMA.  Normal atrial sizes.  Mild aortic valve sclerosis otherwise normal valves.   Family History  Problem Relation Age of  Onset   Multiple sclerosis Son    Bipolar disorder Son    Social History   Socioeconomic History   Marital status: Married    Spouse name: Marily Shows   Number of children: Not on file   Years of education: Not on file   Highest education level: Not on file  Occupational History   Occupation: retired  Tobacco Use   Smoking status: Former    Valdez packs/day: 0.00    Types: Cigarettes    Quit date: 11/21/1985    Years since quitting: 38.4   Smokeless tobacco: Never  Substance and Sexual Activity   Alcohol use: No   Drug use: No   Sexual activity: Yes  Other Topics Concern   Not on file  Social History Narrative   Not on  file   Social Drivers of Health   Financial Resource Strain: Low Risk  (04/18/2024)   Overall Financial Resource Strain (CARDIA)    Difficulty of Paying Living Expenses: Not hard at all  Food Insecurity: No Food Insecurity (04/18/2024)   Hunger Vital Sign    Worried About Running Out of Food in the Last Year: Never true    Ran Out of Food in the Last Year: Never true  Transportation Needs: No Transportation Needs (04/18/2024)   PRAPARE - Administrator, Civil Service (Medical): No    Lack of Transportation (Non-Medical): No  Physical Activity: Inactive (04/18/2024)   Exercise Vital Sign    Days of Exercise per Week: 0 days    Minutes of Exercise per Session: 0 min  Stress: No Stress Concern Present (04/18/2024)   [employer] of Occupational Health - Occupational Stress Questionnaire    Feeling of Stress : Not at all  Social Connections: Socially Integrated (04/18/2024)   Social Connection and Isolation Panel [NHANES]    Frequency of Communication with Friends and Family: Twice a week    Frequency of Social Gatherings with Friends and Family: Three times a week    Attends Religious Services: More than 4 times per year    Active Member of Clubs or Organizations: Yes    Attends Banker Meetings: 1 to 4 times per year    Marital Status: Married    Tobacco Counseling Counseling given: Not Answered   Clinical Intake:  Pre-visit preparation completed: Yes  Pain : No/denies pain     Diabetes: No  How often do you need to have someone help you when you read instructions, pamphlets, or other written materials from your doctor or pharmacy?: 1 - Never  Interpreter Needed?: No  Information entered by :: Kieth Pelt LPN   Activities of Daily Living    04/18/2024   11:04 AM  In your present state of health, do you have any difficulty performing the following activities:  Hearing? 0  Vision? 0  Difficulty concentrating or making decisions? 0   Walking or climbing stairs? 0  Dressing or bathing? 0  Doing errands, shopping? 0  Preparing Food and eating ? N  Using the Toilet? N  In the past six months, have you accidently leaked urine? N  Do you have problems with loss of bowel control? N  Managing your Medications? N  Managing your Finances? N  Housekeeping or managing your Housekeeping? N    Patient Care Team: Austine Lefort, MD as PCP - General (Family Medicine) Arleen Lacer, MD as PCP - Cardiology (Cardiology) Myrle Aspen, [facility] (Inactive) as Pharmacist (Pharmacist) Shon Downing,  MD as Consulting Physician (Ophthalmology) Devin Foerster, MD as Consulting Physician (Ophthalmology)  Indicate any recent Medical Services you may have received from other than [facility] providers in the past year (date may be approximate).     Assessment:    This is a routine wellness examination for Devin Valdez.  Hearing/Vision screen Hearing Screening - Comments:: No trouble hearing Vision Screening - Comments:: Up to date [facility]   Goals Addressed             This Visit's Progress    Patient Stated       Stay Healthy       Depression Screen    04/18/2024   11:07 AM 11/17/2023   11:04 AM 04/13/2023   11:46 AM 04/08/2022   11:08 AM 04/07/2021   12:58 PM 05/09/2019    8:16 AM 06/12/2018    9:47 AM  PHQ 2/9 Scores  PHQ - 2 Score 0 1 0 0 0 2 0  PHQ- 9 Score 1     3     Fall Risk    04/18/2024   11:03 AM 11/17/2023   11:03 AM 04/13/2023   11:49 AM 04/08/2022   11:05 AM 04/07/2021   12:58 PM  Fall Risk   Falls in the past year? 0 0 1 0 0  Number falls in past yr: 0 0 0 0 0  Injury with Fall? 0 0 0 0 0  Risk for fall due to :   History of fall(s);Impaired balance/gait;Impaired mobility Other (Comment);Impaired balance/gait   Risk for fall due to: Comment    hx of stroke   Follow up Falls evaluation completed;Education provided;Falls prevention discussed  Falls prevention discussed;Education provided;Falls  evaluation completed Falls prevention discussed     MEDICARE RISK AT HOME: Medicare Risk at Home Any stairs in or around the home?: Yes If so, are there any without handrails?: No Home free of loose throw rugs in walkways, pet beds, electrical cords, etc?: Yes Adequate lighting in your home to reduce risk of falls?: Yes Life alert?: No Use of a cane, walker or w/c?: No Grab bars in the bathroom?: Yes Shower chair or bench in shower?: Yes Elevated toilet seat or a handicapped toilet?: Yes  TIMED UP AND GO:  Was the test performed?  No    Cognitive Function:        04/18/2024   11:04 AM 04/13/2023   11:49 AM 04/08/2022   11:12 AM 04/07/2021   12:59 PM  6CIT Screen  What Year? 0 points 0 points 0 points 0 points  What month? 3 points 0 points 0 points 0 points  What time? 0 points 0 points 0 points 0 points  Count back from 20 0 points 0 points 0 points 0 points  Months in reverse 4 points 2 points 0 points 0 points  Repeat phrase 0 points 0 points 0 points 0 points  Total Score 7 points 2 points 0 points 0 points    Immunizations Immunization History  Administered Date(s) Administered   Fluad Quad(high Dose 65+) 08/06/2019   Moderna SARS-COV2 Booster Vaccination 01/03/2020   Moderna Sars-Covid-2 Vaccination 12/06/2019    TDAP status: Due, Education has been provided regarding the importance of this vaccine. Advised may receive this vaccine at local pharmacy or Health Dept. Aware to provide a copy of the vaccination record if obtained from local pharmacy or Health Dept. Verbalized acceptance and understanding.  Flu Vaccine status: Up to date  Pneumococcal vaccine status: Declined,  Education  has been provided regarding the importance of this vaccine but patient still declined. Advised may receive this vaccine at local pharmacy or Health Dept. Aware to provide a copy of the vaccination record if obtained from local pharmacy or Health Dept. Verbalized acceptance and  understanding.   Covid-19 vaccine status: Declined, Education has been provided regarding the importance of this vaccine but patient still declined. Advised may receive this vaccine at local pharmacy or Health Dept.or vaccine clinic. Aware to provide a copy of the vaccination record if obtained from local pharmacy or Health Dept. Verbalized acceptance and understanding.  Qualifies for Shingles Vaccine? Yes   Zostavax completed No   Shingrix Completed?: No.    Education has been provided regarding the importance of this vaccine. Patient has been advised to call insurance company to determine out of pocket expense if they have not yet received this vaccine. Advised may also receive vaccine at local pharmacy or Health Dept. Verbalized acceptance and understanding.  Screening Tests Health Maintenance  Topic Date Due   DTaP/Tdap/Td (1 - Tdap) Never done   COVID-19 Vaccine (3 - 2024-25 season) 05/04/2024 (Originally 07/23/2023)   Zoster Vaccines- Shingrix (1 of 2) 07/19/2024 (Originally 05/27/1991)   Pneumonia Vaccine 2+ Years old (1 of 1 - PCV) 04/18/2025 (Originally 05/27/1991)   INFLUENZA VACCINE  06/21/2024   Medicare Annual Wellness (AWV)  04/18/2025   HPV VACCINES  Aged Out   Meningococcal B Vaccine  Aged Out    Health Maintenance  Health Maintenance Due  Topic Date Due   DTaP/Tdap/Td (1 - Tdap) Never done    Colorectal cancer screening: No longer required.   Lung Cancer Screening: (Low Dose CT Chest recommended if Age 68-80 years, 20 pack-year currently smoking OR have quit w/in 15years.) does not qualify.   Lung Cancer Screening Referral:   Additional Screening:  Hepatitis C Screening: does not qualify  Vision Screening: Recommended annual ophthalmology exams for early detection of glaucoma and other disorders of the eye. Is the patient up to date with their annual eye exam?  Yes  Who is the provider or what is the name of the office in which the patient attends annual eye exams?  [facility] If pt is not established with a provider, would they like to be referred to a provider to establish care? No .   Dental Screening: Recommended annual dental exams for proper oral hygiene    Community Resource Referral / Chronic Care Management: CRR required this visit?  No   CCM required this visit?  No     Plan:     I have personally reviewed and noted the following in the patient's chart:   Medical and social history Use of alcohol, tobacco or illicit drugs  Valdez medications and supplements including opioid prescriptions. Patient is not currently taking opioid prescriptions. Functional ability and status Nutritional status Physical activity Advanced directives List of other physicians Hospitalizations, surgeries, and ER visits in previous 12 months Vitals Screenings to include cognitive, depression, and falls Referrals and appointments  In addition, I have reviewed and discussed with patient certain preventive protocols, quality metrics, and best practice recommendations. A written personalized care plan for preventive services as well as general preventive health recommendations were provided to patient.     Kieth Pelt, LPN   9/81/1914   After Visit Summary: (MyChart) Due to this being a telephonic visit, the after visit summary with patients personalized plan was offered to patient via MyChart   Nurse Notes:

## 2024-04-18 NOTE — Patient Instructions (Signed)
 [name] , Thank you for taking time to come for your Medicare Wellness Visit. I appreciate your ongoing commitment to your health goals. Please review the following plan we discussed and let me know if I can assist you in the future.   Screening recommendations/referrals: Colonoscopy: no longer required Recommended yearly ophthalmology/optometry visit for glaucoma screening and checkup Recommended yearly dental visit for hygiene and checkup  Vaccinations: Influenza vaccine: Pneumococcal vaccine: Education provided Tdap vaccine: Education provided Shingles vaccine: Education provided    Advanced directives: Education provided  Preventive Care 65 Years and Older, Male Preventive care refers to lifestyle choices and visits with your health care provider that can promote health and wellness. What does preventive care include? A yearly physical exam. This is also called an annual well check. Dental exams once or twice a year. Routine eye exams. Ask your health care provider how often you should have your eyes checked. Personal lifestyle choices, including: Daily care of your teeth and gums. Regular physical activity. Eating a healthy diet. Avoiding tobacco and drug use. Limiting alcohol use. Practicing safe sex. Taking low doses of aspirin  every day. Taking vitamin and mineral supplements as recommended by your health care provider. What happens during an annual well check? The services and screenings done by your health care provider during your annual well check will depend on your age, overall health, lifestyle risk factors, and family history of disease. Counseling  Your health care provider may ask you questions about your: Alcohol use. Tobacco use. Drug use. Emotional well-being. Home and relationship well-being. Sexual activity. Eating habits. History of falls. Memory and ability to understand (cognition). Work and work Astronomer. Screening  You may have the  following tests or measurements: Height, weight, and BMI. Blood pressure. Lipid and cholesterol levels. These may be checked every 5 years, or more frequently if you are over 15 years old. Skin check. Lung cancer screening. You may have this screening every year starting at age 64 if you have a 30-pack-year history of smoking and currently smoke or have quit within the past 15 years. Fecal occult blood test (FOBT) of the stool. You may have this test every year starting at age 59. Flexible sigmoidoscopy or colonoscopy. You may have a sigmoidoscopy every 5 years or a colonoscopy every 10 years starting at age 95. Prostate cancer screening. Recommendations will vary depending on your family history and other risks. Hepatitis C blood test. Hepatitis B blood test. Sexually transmitted disease (STD) testing. Diabetes screening. This is done by checking your blood sugar (glucose) after you have not eaten for a while (fasting). You may have this done every 1-3 years. Abdominal aortic aneurysm (AAA) screening. You may need this if you are a current or former smoker. Osteoporosis. You may be screened starting at age 53 if you are at high risk. Talk with your health care provider about your test results, treatment options, and if necessary, the need for more tests. Vaccines  Your health care provider may recommend certain vaccines, such as: Influenza vaccine. This is recommended every year. Tetanus, diphtheria, and acellular pertussis (Tdap, Td) vaccine. You may need a Td booster every 10 years. Zoster vaccine. You may need this after age 63. Pneumococcal 13-valent conjugate (PCV13) vaccine. One dose is recommended after age 52. Pneumococcal polysaccharide (PPSV23) vaccine. One dose is recommended after age 68. Talk to your health care provider about which screenings and vaccines you need and how often you need them. This information is not intended to replace advice  given to you by your health care  provider. Make sure you discuss any questions you have with your health care provider. Document Released: 12/04/2015 Document Revised: 07/27/2016 Document Reviewed: 09/08/2015 Elsevier Interactive Patient Education  2017 [employer].  Fall Prevention in the Home Falls can cause injuries. They can happen to people of all ages. There are many things you can do to make your home safe and to help prevent falls. What can I do on the outside of my home? Regularly fix the edges of walkways and driveways and fix any cracks. Remove anything that might make you trip as you walk through a door, such as a raised step or threshold. Trim any bushes or trees on the path to your home. Use bright outdoor lighting. Clear any walking paths of anything that might make someone trip, such as rocks or tools. Regularly check to see if handrails are loose or broken. Make sure that both sides of any steps have handrails. Any raised decks and porches should have guardrails on the edges. Have any leaves, snow, or ice cleared regularly. Use sand or salt on walking paths during winter. Clean up any spills in your garage right away. This includes oil or grease spills. What can I do in the bathroom? Use night lights. Install grab bars by the toilet and in the tub and shower. Do not use towel bars as grab bars. Use non-skid mats or decals in the tub or shower. If you need to sit down in the shower, use a plastic, non-slip stool. Keep the floor dry. Clean up any water that spills on the floor as soon as it happens. Remove soap buildup in the tub or shower regularly. Attach bath mats securely with double-sided non-slip rug tape. Do not have throw rugs and other things on the floor that can make you trip. What can I do in the bedroom? Use night lights. Make sure that you have a light by your bed that is easy to reach. Do not use any sheets or blankets that are too big for your bed. They should not hang down onto the  floor. Have a firm chair that has side arms. You can use this for support while you get dressed. Do not have throw rugs and other things on the floor that can make you trip. What can I do in the kitchen? Clean up any spills right away. Avoid walking on wet floors. Keep items that you use a lot in easy-to-reach places. If you need to reach something above you, use a strong step stool that has a grab bar. Keep electrical cords out of the way. Do not use floor polish or wax that makes floors slippery. If you must use wax, use non-skid floor wax. Do not have throw rugs and other things on the floor that can make you trip. What can I do with my stairs? Do not leave any items on the stairs. Make sure that there are handrails on both sides of the stairs and use them. Fix handrails that are broken or loose. Make sure that handrails are as long as the stairways. Check any carpeting to make sure that it is firmly attached to the stairs. Fix any carpet that is loose or worn. Avoid having throw rugs at the top or bottom of the stairs. If you do have throw rugs, attach them to the floor with carpet tape. Make sure that you have a light switch at the top of the stairs and the bottom of  the stairs. If you do not have them, ask someone to add them for you. What else can I do to help prevent falls? Wear shoes that: Do not have high heels. Have rubber bottoms. Are comfortable and fit you well. Are closed at the toe. Do not wear sandals. If you use a stepladder: Make sure that it is fully opened. Do not climb a closed stepladder. Make sure that both sides of the stepladder are locked into place. Ask someone to hold it for you, if possible. Clearly mark and make sure that you can see: Any grab bars or handrails. First and last steps. Where the edge of each step is. Use tools that help you move around (mobility aids) if they are needed. These include: Canes. Walkers. Scooters. Crutches. Turn on the  lights when you go into a dark area. Replace any light bulbs as soon as they burn out. Set up your furniture so you have a clear path. Avoid moving your furniture around. If any of your floors are uneven, fix them. If there are any pets around you, be aware of where they are. Review your medicines with your doctor. Some medicines can make you feel dizzy. This can increase your chance of falling. Ask your doctor what other things that you can do to help prevent falls. This information is not intended to replace advice given to you by your health care provider. Make sure you discuss any questions you have with your health care provider. Document Released: 09/03/2009 Document Revised: 04/14/2016 Document Reviewed: 12/12/2014 Elsevier Interactive Patient Education  2017 [employer].

## 2024-04-25 ENCOUNTER — Encounter: Payer: Self-pay | Admitting: Family Medicine

## 2024-04-25 ENCOUNTER — Ambulatory Visit: Admitting: Family Medicine

## 2024-04-25 ENCOUNTER — Ambulatory Visit: Attending: Family Medicine

## 2024-04-25 VITALS — BP 118/68 | HR 67 | Temp 98.8°F | Ht 66.0 in | Wt 159.0 lb

## 2024-04-25 DIAGNOSIS — R002 Palpitations: Secondary | ICD-10-CM

## 2024-04-25 DIAGNOSIS — E7800 Pure hypercholesterolemia, unspecified: Secondary | ICD-10-CM | POA: Diagnosis not present

## 2024-04-25 NOTE — Progress Notes (Unsigned)
 EP to read.

## 2024-04-25 NOTE — Progress Notes (Signed)
 Subjective:    Patient ID: Devin Valdez, male    DOB: Nov 16, 1941, 83 y.o.   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  He also has a history of cervical spinal stenosis.   Wt Readings from Last 3 Encounters:  04/25/24 159 lb (72.1 kg)  11/17/23 162 lb (73.5 kg)  04/13/23 158 lb (71.7 kg)  Wife is concerned because she states he has very little energy.  She states that his get up and go has got up and left.  He primarily just wants to sit around.  He denies depression.  They both deny memory loss.  Specifically he denies any chest pain or shortness of breath or dyspnea on exertion.  The patient has a flat affect.  He also has a slow shuffling gait.  His shuffling gait has been present ever since his stroke.  I question the patient if his wife has noticed any tremor.  She denies any pill-rolling tremor although she is does occasionally describe what sounds like an essential tremor.  I am concerned that the patient may be demonstrating some mild Parkinson features.  However at the present time there is no obvious abnormalities on his exam  Past Medical History:  Diagnosis Date   Anemia    Anxiety    Cervical stenosis of spinal canal    mri 8/19- severe   Depression    Falls    Hallucinations    Hyperlipidemia    Hypertension    Stroke [facility])    left paracentral pons infarct   Past Surgical History:  Procedure Laterality Date   Carotid Dopplers  12/2019   CVA evaluation: Bilateral ICA <40%.  Bilateral vertebral arteries with normal flow.  Bilateral subclavian arteries are normal flow.   EYE SURGERY     HERNIA REPAIR     POSTERIOR CERVICAL LAMINECTOMY     with fusion C3-C7 10/19- Dr. Clide Dalton at [facility] ECHOCARDIOGRAM  11/2019   (CVA evaluation) EF 55 to 60%.  GR 1 DD.  No RWMA.  Normal atrial sizes.  Mild aortic valve sclerosis otherwise normal valves.   Current  Outpatient Medications on File Prior to Visit  Medication Sig Dispense Refill   acetaminophen  (TYLENOL ) 325 MG tablet Take 650 mg by mouth every 6 (six) hours as needed for mild pain or headache.     aspirin  81 MG chewable tablet Chew by mouth daily.     atorvastatin  (LIPITOR ) 40 MG tablet TAKE ONE TABLET (40MG  TOTAL) BY MOUTH DAILY AT 6PM 90 tablet 1   Coenzyme Q10 (CO Q-10) 200 MG CAPS Take by mouth daily.     ferrous sulfate  325 (65 FE) MG tablet Take 1 tablet (325 mg total) by mouth 2 (two) times daily with a meal. 60 tablet 1   losartan  (COZAAR ) 50 MG tablet TAKE ONE TABLET BY MOUTH EVERY DAY FOR BLOOD PRESSURE 90 tablet 1   Multiple Vitamins-Minerals (CENTRUM SILVER  ADULT 50+) TABS Take 1 capsule by mouth 2 (two) times daily. 30 tablet 2   Ascorbic Acid (VITAMIN C) 1000 MG tablet Take 1,000 mg by mouth daily. (Patient not taking: Reported on 11/17/2023)     No current facility-administered medications on file prior to visit.   No Known Allergies Social History   Socioeconomic History   Marital status: Married    Spouse name: Marily Shows   Number of children: Not on  file   Years of education: Not on file   Highest education level: Not on file  Occupational History   Occupation: retired  Tobacco Use   Smoking status: Former    Current packs/day: 0.00    Types: Cigarettes    Quit date: 11/21/1985    Years since quitting: 38.4   Smokeless tobacco: Never  Substance and Sexual Activity   Alcohol use: No   Drug use: No   Sexual activity: Yes  Other Topics Concern   Not on file  Social History Narrative   Not on file   Social Drivers of Health   Financial Resource Strain: Low Risk  (04/18/2024)   Overall Financial Resource Strain (CARDIA)    Difficulty of Paying Living Expenses: Not hard at all  Food Insecurity: No Food Insecurity (04/18/2024)   Hunger Vital Sign    Worried About Running Out of Food in the Last Year: Never true    Ran Out of Food in the Last Year: Never true   Transportation Needs: No Transportation Needs (04/18/2024)   PRAPARE - Administrator, Civil Service (Medical): No    Lack of Transportation (Non-Medical): No  Physical Activity: Inactive (04/18/2024)   Exercise Vital Sign    Days of Exercise per Week: 0 days    Minutes of Exercise per Session: 0 min  Stress: No Stress Concern Present (04/18/2024)   [employer] of Occupational Health - Occupational Stress Questionnaire    Feeling of Stress : Not at all  Social Connections: Socially Integrated (04/18/2024)   Social Connection and Isolation Panel [NHANES]    Frequency of Communication with Friends and Family: Twice a week    Frequency of Social Gatherings with Friends and Family: Three times a week    Attends Religious Services: More than 4 times per year    Active Member of Clubs or Organizations: Yes    Attends Banker Meetings: 1 to 4 times per year    Marital Status: Married  Catering manager Violence: Not At Risk (04/18/2024)   Humiliation, Afraid, Rape, and Kick questionnaire    Fear of Current or Ex-Partner: No    Emotionally Abused: No    Physically Abused: No    Sexually Abused: No    Review of Systems  All other systems reviewed and are negative.      Objective:   Physical Exam Constitutional:      Appearance: Normal appearance. He is normal weight.  Cardiovascular:     Rate and Rhythm: Normal rate and regular rhythm. Frequent Extrasystoles are present.    Heart sounds: Normal heart sounds. No murmur heard.    No friction rub. No gallop.  Pulmonary:     Effort: Pulmonary effort is normal. No respiratory distress.     Breath sounds: Normal breath sounds. No stridor. No wheezing, rhonchi or rales.  Chest:     Chest wall: No tenderness.  Abdominal:     General: Abdomen is flat. Bowel sounds are normal.     Palpations: Abdomen is soft.  Musculoskeletal:     Right lower leg: No edema.     Left lower leg: No edema.  Neurological:      General: No focal deficit present.     Mental Status: He is alert and oriented to person, place, and time. Mental status is at baseline.     Cranial Nerves: No cranial nerve deficit.     Sensory: Sensation is intact. No sensory deficit.  Motor: No weakness or seizure activity.     Coordination: Coordination is intact. Coordination normal.     Gait: Gait normal.     Deep Tendon Reflexes: Reflexes are normal and symmetric.           Assessment & Plan:  Pure hypercholesterolemia - Plan: CBC with Differential/Platelet, Comprehensive metabolic panel with GFR, Lipid panel  Palpitations - Plan: TSH Patient has a slow shuffling gait.  He has had this ever since his stroke although it seems slightly worse today.  This makes me concerned about possible parkinsonism.  Patient had the same presentation in December.  He minimizes symptoms and wife is obviously concerned.  She states that he just sits and watches TV all day.  He denies depression or anhedonia.  He denies memory loss and she agrees.  There is no tremor but there is some bradykinesia and postural instability.  Will continue to monitor for now.  Patient refuses pneumovax and shingrix.  On exam he is in bigeminy, I will arrange a zio patch monitor to evaluate further.

## 2024-04-26 ENCOUNTER — Ambulatory Visit: Payer: Self-pay | Admitting: Family Medicine

## 2024-04-26 LAB — CBC WITH DIFFERENTIAL/PLATELET
Absolute Lymphocytes: 1076 {cells}/uL (ref 850–3900)
Absolute Monocytes: 705 {cells}/uL (ref 200–950)
Basophils Absolute: 32 {cells}/uL (ref 0–200)
Basophils Relative: 0.6 %
Eosinophils Absolute: 170 {cells}/uL (ref 15–500)
Eosinophils Relative: 3.2 %
HCT: 35.7 % — ABNORMAL LOW (ref 38.5–50.0)
Hemoglobin: 11.1 g/dL — ABNORMAL LOW (ref 13.2–17.1)
MCH: 28.1 pg (ref 27.0–33.0)
MCHC: 31.1 g/dL — ABNORMAL LOW (ref 32.0–36.0)
MCV: 90.4 fL (ref 80.0–100.0)
MPV: 10.7 fL (ref 7.5–12.5)
Monocytes Relative: 13.3 %
Neutro Abs: 3318 {cells}/uL (ref 1500–7800)
Neutrophils Relative %: 62.6 %
Platelets: 213 10*3/uL (ref 140–400)
RBC: 3.95 10*6/uL — ABNORMAL LOW (ref 4.20–5.80)
RDW: 11.7 % (ref 11.0–15.0)
Total Lymphocyte: 20.3 %
WBC: 5.3 10*3/uL (ref 3.8–10.8)

## 2024-04-26 LAB — COMPREHENSIVE METABOLIC PANEL WITH GFR
AG Ratio: 1.9 (calc) (ref 1.0–2.5)
ALT: 21 U/L (ref 9–46)
AST: 20 U/L (ref 10–35)
Albumin: 4.3 g/dL (ref 3.6–5.1)
Alkaline phosphatase (APISO): 83 U/L (ref 35–144)
BUN: 18 mg/dL (ref 7–25)
CO2: 24 mmol/L (ref 20–32)
Calcium: 9.7 mg/dL (ref 8.6–10.3)
Chloride: 103 mmol/L (ref 98–110)
Creat: 1.19 mg/dL (ref 0.70–1.22)
Globulin: 2.3 g/dL (ref 1.9–3.7)
Glucose, Bld: 101 mg/dL — ABNORMAL HIGH (ref 65–99)
Potassium: 4.5 mmol/L (ref 3.5–5.3)
Sodium: 138 mmol/L (ref 135–146)
Total Bilirubin: 0.4 mg/dL (ref 0.2–1.2)
Total Protein: 6.6 g/dL (ref 6.1–8.1)
eGFR: 61 mL/min/{1.73_m2} (ref >=60)

## 2024-04-26 LAB — LIPID PANEL
Cholesterol: 146 mg/dL (ref <200)
HDL: 50 mg/dL (ref >=40)
LDL Cholesterol (Calc): 70 mg/dL
Non-HDL Cholesterol (Calc): 96 mg/dL (ref <130)
Total CHOL/HDL Ratio: 2.9 (calc) (ref <5.0)
Triglycerides: 185 mg/dL — ABNORMAL HIGH (ref <150)

## 2024-04-26 LAB — TSH: TSH: 1.8 m[IU]/L (ref 0.40–4.50)

## 2024-05-02 DIAGNOSIS — H34832 Tributary (branch) retinal vein occlusion, left eye, with macular edema: Secondary | ICD-10-CM | POA: Diagnosis not present

## 2024-05-14 ENCOUNTER — Telehealth: Payer: Self-pay | Admitting: Family Medicine

## 2024-05-14 NOTE — Telephone Encounter (Signed)
 No contact made on first attempt call. Lvm informing pt. That results have not been released or read as of yet. As soon as we receive them we will give her a call w/ the results.

## 2024-05-14 NOTE — Telephone Encounter (Signed)
 Copied from CRM [id number]. Topic: General - Other >> May 13, 2024  3:13 PM Zebedee SAUNDERS wrote: Reason for CRM: Pt's wife Langston, [place] called ph:[phone]  want to know results from heart monitor test. Please call [phone].

## 2024-05-16 DIAGNOSIS — R002 Palpitations: Secondary | ICD-10-CM | POA: Diagnosis not present

## 2024-05-17 DIAGNOSIS — R002 Palpitations: Secondary | ICD-10-CM

## 2024-05-18 DIAGNOSIS — J069 Acute upper respiratory infection, unspecified: Secondary | ICD-10-CM | POA: Diagnosis not present

## 2024-05-28 DIAGNOSIS — H35352 Cystoid macular degeneration, left eye: Secondary | ICD-10-CM | POA: Diagnosis not present

## 2024-05-28 DIAGNOSIS — H4322 Crystalline deposits in vitreous body, left eye: Secondary | ICD-10-CM | POA: Diagnosis not present

## 2024-05-28 DIAGNOSIS — H353122 Nonexudative age-related macular degeneration, left eye, intermediate dry stage: Secondary | ICD-10-CM | POA: Diagnosis not present

## 2024-05-28 DIAGNOSIS — H353112 Nonexudative age-related macular degeneration, right eye, intermediate dry stage: Secondary | ICD-10-CM | POA: Diagnosis not present

## 2024-05-28 DIAGNOSIS — H43822 Vitreomacular adhesion, left eye: Secondary | ICD-10-CM | POA: Diagnosis not present

## 2024-05-28 DIAGNOSIS — H34832 Tributary (branch) retinal vein occlusion, left eye, with macular edema: Secondary | ICD-10-CM | POA: Diagnosis not present

## 2024-05-28 DIAGNOSIS — H348112 Central retinal vein occlusion, right eye, stable: Secondary | ICD-10-CM | POA: Diagnosis not present

## 2024-06-07 ENCOUNTER — Encounter: Payer: Self-pay | Admitting: Family Medicine

## 2024-06-07 ENCOUNTER — Ambulatory Visit (INDEPENDENT_AMBULATORY_CARE_PROVIDER_SITE_OTHER): Admitting: Family Medicine

## 2024-06-07 VITALS — BP 122/62 | HR 68 | Temp 98.6°F | Ht 66.0 in | Wt 158.8 lb

## 2024-06-07 DIAGNOSIS — M6281 Muscle weakness (generalized): Secondary | ICD-10-CM

## 2024-06-07 NOTE — Progress Notes (Signed)
 Subjective:    Patient ID: Devin Valdez, male    DOB: [birth year], 83 y.o.   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  He also has a history of cervical spinal stenosis.   Wt Readings from Last 3 Encounters:  06/07/24 158 lb 12.8 oz (72 kg)  04/25/24 159 lb (72.1 kg)  11/17/23 162 lb (73.5 kg)  Wife is concerned because she states he has very little energy.  She states that his get up and go has got up and left.  He primarily just wants to sit around.  He denies depression.  They both deny memory loss.  Specifically he denies any chest pain or shortness of breath or dyspnea on exertion.  The patient has a flat affect.  He also has a slow shuffling gait.  His shuffling gait has been present ever since his stroke.  I question the patient if his wife has noticed any tremor.  She denies any pill-rolling tremor although she is does occasionally describe what sounds like an essential tremor.  I am concerned that the patient may be demonstrating some mild Parkinson features.  However at the present time there is no obvious abnormalities on his exam   06/07/24 Patient reports weakness in his legs.  He states that he has no energy in his legs.  He has a difficult time standing up to get on the exam table.  Hip flexion and hip extension are limited to 4 out of 5 muscle strength bilaterally and knee motion extension are equal and symmetric bilaterally.  Ankle flexion and plantarflexion are normal.  Patient does have good strength in his arms.  His weakness seems to be limited to the proximal muscles of his legs.  He does have a slow shuffling gait and a very flat masklike affect.  I again am concerned about his parkinsonian features.  He does report issues with his balance Past Medical History:  Diagnosis Date   Anemia    Anxiety    Cervical stenosis of spinal canal    mri 8/19- severe    Depression    Falls    Hallucinations    Hyperlipidemia    Hypertension    Stroke [facility])    left paracentral pons infarct   Past Surgical History:  Procedure Laterality Date   Carotid Dopplers  12/2019   CVA evaluation: Bilateral ICA <40%.  Bilateral vertebral arteries with normal flow.  Bilateral subclavian arteries are normal flow.   EYE SURGERY     HERNIA REPAIR     POSTERIOR CERVICAL LAMINECTOMY     with fusion C3-C7 10/19- Dr. Bufford at [facility] ECHOCARDIOGRAM  11/2019   (CVA evaluation) EF 55 to 60%.  GR 1 DD.  No RWMA.  Normal atrial sizes.  Mild aortic valve sclerosis otherwise normal valves.   Current Outpatient Medications on File Prior to Visit  Medication Sig Dispense Refill   acetaminophen  (TYLENOL ) 325 MG tablet Take 650 mg by mouth every 6 (six) hours as needed for mild pain or headache.     Ascorbic Acid (VITAMIN C) 1000 MG tablet Take 1,000 mg by mouth daily. (Patient not taking: Reported on 11/17/2023)     aspirin  81 MG chewable tablet Chew by mouth daily.     atorvastatin  (LIPITOR ) 40 MG tablet TAKE ONE TABLET (40MG  TOTAL) BY MOUTH DAILY AT 6PM 90  tablet 1   Coenzyme Q10 (CO Q-10) 200 MG CAPS Take by mouth daily.     ferrous sulfate  325 (65 FE) MG tablet Take 1 tablet (325 mg total) by mouth 2 (two) times daily with a meal. 60 tablet 1   losartan  (COZAAR ) 50 MG tablet TAKE ONE TABLET BY MOUTH EVERY DAY FOR BLOOD PRESSURE 90 tablet 1   Multiple Vitamins-Minerals (CENTRUM SILVER  ADULT 50+) TABS Take 1 capsule by mouth 2 (two) times daily. 30 tablet 2   No current facility-administered medications on file prior to visit.   No Known Allergies Social History   Socioeconomic History   Marital status: Married    Spouse name: [name]   Number of children: Not on file   Years of education: Not on file   Highest education level: Not on file  Occupational History   Occupation: retired  Tobacco Use   Smoking status: Former    Current packs/day: 0.00     Types: Cigarettes    Quit date: 11/21/1985    Years since quitting: 38.5   Smokeless tobacco: Never  Substance and Sexual Activity   Alcohol use: No   Drug use: No   Sexual activity: Yes  Other Topics Concern   Not on file  Social History Narrative   Not on file   Social Drivers of Health   Financial Resource Strain: Low Risk  (04/18/2024)   Overall Financial Resource Strain (CARDIA)    Difficulty of Paying Living Expenses: Not hard at all  Food Insecurity: No Food Insecurity (04/18/2024)   Hunger Vital Sign    Worried About Running Out of Food in the Last Year: Never true    Ran Out of Food in the Last Year: Never true  Transportation Needs: No Transportation Needs (04/18/2024)   PRAPARE - Administrator, Civil Service (Medical): No    Lack of Transportation (Non-Medical): No  Physical Activity: Inactive (04/18/2024)   Exercise Vital Sign    Days of Exercise per Week: 0 days    Minutes of Exercise per Session: 0 min  Stress: No Stress Concern Present (04/18/2024)   [employer] of Occupational Health - Occupational Stress Questionnaire    Feeling of Stress : Not at all  Social Connections: Socially Integrated (04/18/2024)   Social Connection and Isolation Panel    Frequency of Communication with Friends and Family: Twice a week    Frequency of Social Gatherings with Friends and Family: Three times a week    Attends Religious Services: More than 4 times per year    Active Member of Clubs or Organizations: Yes    Attends Banker Meetings: 1 to 4 times per year    Marital Status: Married  Catering manager Violence: Not At Risk (04/18/2024)   Humiliation, Afraid, Rape, and Kick questionnaire    Fear of Current or Ex-Partner: No    Emotionally Abused: No    Physically Abused: No    Sexually Abused: No    Review of Systems  All other systems reviewed and are negative.      Objective:   Physical Exam Constitutional:      Appearance: Normal  appearance. He is normal weight.  Cardiovascular:     Rate and Rhythm: Normal rate and regular rhythm. Frequent Extrasystoles are present.    Heart sounds: Normal heart sounds. No murmur heard.    No friction rub. No gallop.  Pulmonary:     Effort: Pulmonary effort is normal. No respiratory distress.  Breath sounds: Normal breath sounds. No stridor. No wheezing, rhonchi or rales.  Chest:     Chest wall: No tenderness.  Abdominal:     General: Abdomen is flat. Bowel sounds are normal.     Palpations: Abdomen is soft.  Musculoskeletal:     Right lower leg: No edema.     Left lower leg: No edema.  Neurological:     General: No focal deficit present.     Mental Status: He is alert and oriented to person, place, and time. Mental status is at baseline.     Cranial Nerves: No cranial nerve deficit.     Sensory: Sensation is intact. No sensory deficit.     Motor: No weakness or seizure activity.     Coordination: Coordination is intact. Coordination normal.     Gait: Gait normal.     Deep Tendon Reflexes: Reflexes are normal and symmetric.           Assessment & Plan:  Muscle weakness - Plan: Testosterone Total,Free,Bio, Males, CK, CBC with Differential/Platelet, Comprehensive metabolic panel with GFR, Sedimentation rate Patient has a slow shuffling gait.  He has had this ever since his stroke although it seems slightly worse today.  This makes me concerned about possible parkinsonism.  Patient had the same presentation in December.  He minimizes symptoms and wife is obviously concerned.  She states that he just sits and watches TV all day.  He denies depression or anhedonia.  He denies memory loss and she agrees.  There is no tremor but there is some bradykinesia and postural instability.  I will check a CK level to evaluate for statin this myopathy along with a sedimentation rate.  Rarely with statin.  If labs are normal, I would recommend a neurology consultation for parkinsonian  features as well as physical therapy for muscle weakness which I believe is primarily due to deconditioning

## 2024-06-08 LAB — CBC WITH DIFFERENTIAL/PLATELET
Absolute Lymphocytes: 827 {cells}/uL — ABNORMAL LOW (ref 850–3900)
Absolute Monocytes: 666 {cells}/uL (ref 200–950)
Basophils Absolute: 21 {cells}/uL (ref 0–200)
Basophils Relative: 0.4 %
Eosinophils Absolute: 203 {cells}/uL (ref 15–500)
Eosinophils Relative: 3.9 %
HCT: 34.2 % — ABNORMAL LOW (ref 38.5–50.0)
Hemoglobin: 10.8 g/dL — ABNORMAL LOW (ref 13.2–17.1)
MCH: 28.6 pg (ref 27.0–33.0)
MCHC: 31.6 g/dL — ABNORMAL LOW (ref 32.0–36.0)
MCV: 90.7 fL (ref 80.0–100.0)
MPV: 11 fL (ref 7.5–12.5)
Monocytes Relative: 12.8 %
Neutro Abs: 3484 {cells}/uL (ref 1500–7800)
Neutrophils Relative %: 67 %
Platelets: 201 Thousand/uL (ref 140–400)
RBC: 3.77 Million/uL — ABNORMAL LOW (ref 4.20–5.80)
RDW: 11.8 % (ref 11.0–15.0)
Total Lymphocyte: 15.9 %
WBC: 5.2 Thousand/uL (ref 3.8–10.8)

## 2024-06-08 LAB — COMPREHENSIVE METABOLIC PANEL WITH GFR
AG Ratio: 2 (calc) (ref 1.0–2.5)
ALT: 22 U/L (ref 9–46)
AST: 19 U/L (ref 10–35)
Albumin: 4.4 g/dL (ref 3.6–5.1)
Alkaline phosphatase (APISO): 92 U/L (ref 35–144)
BUN: 18 mg/dL (ref 7–25)
CO2: 27 mmol/L (ref 20–32)
Calcium: 9.8 mg/dL (ref 8.6–10.3)
Chloride: 105 mmol/L (ref 98–110)
Creat: 1.19 mg/dL (ref 0.70–1.22)
Globulin: 2.2 g/dL (ref 1.9–3.7)
Glucose, Bld: 105 mg/dL — ABNORMAL HIGH (ref 65–99)
Potassium: 4.2 mmol/L (ref 3.5–5.3)
Sodium: 140 mmol/L (ref 135–146)
Total Bilirubin: 0.4 mg/dL (ref 0.2–1.2)
Total Protein: 6.6 g/dL (ref 6.1–8.1)
eGFR: 61 mL/min/1.73m2 (ref >=60)

## 2024-06-08 LAB — CK: Total CK: 92 U/L (ref 17–247)

## 2024-06-08 LAB — TESTOSTERONE TOTAL,FREE,BIO, MALES
Albumin: 4.4 g/dL (ref 3.6–5.1)
Sex Hormone Binding: 56 nmol/L (ref 22–77)
Testosterone: 216 ng/dL — ABNORMAL LOW (ref 250–827)

## 2024-06-08 LAB — SEDIMENTATION RATE: Sed Rate: 17 mm/h (ref 0–20)

## 2024-06-10 ENCOUNTER — Ambulatory Visit: Payer: Self-pay | Admitting: Family Medicine

## 2024-06-10 ENCOUNTER — Other Ambulatory Visit: Payer: Self-pay

## 2024-06-10 DIAGNOSIS — M6281 Muscle weakness (generalized): Secondary | ICD-10-CM

## 2024-06-10 DIAGNOSIS — R5383 Other fatigue: Secondary | ICD-10-CM

## 2024-06-10 DIAGNOSIS — R2681 Unsteadiness on feet: Secondary | ICD-10-CM

## 2024-06-10 DIAGNOSIS — R296 Repeated falls: Secondary | ICD-10-CM

## 2024-06-12 ENCOUNTER — Telehealth: Payer: Self-pay

## 2024-06-12 NOTE — Telephone Encounter (Signed)
 Copied from CRM [id number]. Topic: General - Other >> Jun 11, 2024  4:20 PM Tiffany S wrote: Reason for CRM: Patient called stating physical therapy needs to be setup in [place] please follow up with patient

## 2024-06-14 ENCOUNTER — Other Ambulatory Visit: Payer: Self-pay

## 2024-06-14 ENCOUNTER — Telehealth: Payer: Self-pay

## 2024-06-14 DIAGNOSIS — R5383 Other fatigue: Secondary | ICD-10-CM

## 2024-06-14 DIAGNOSIS — R2681 Unsteadiness on feet: Secondary | ICD-10-CM

## 2024-06-14 DIAGNOSIS — R296 Repeated falls: Secondary | ICD-10-CM

## 2024-06-14 DIAGNOSIS — M6281 Muscle weakness (generalized): Secondary | ICD-10-CM

## 2024-06-14 NOTE — Telephone Encounter (Signed)
 Copied from CRM [id number]. Topic: General - Other >> Jun 14, 2024  2:31 PM Kevelyn M wrote: Reason for CRM: Patient's wife calling in to put in a PT referral request. They would like it in the same location as his neurologist. Call back # [phone].

## 2024-07-02 ENCOUNTER — Ambulatory Visit: Attending: Family Medicine | Admitting: Physical Therapy

## 2024-07-02 VITALS — BP 142/59 | HR 65 | Temp 97.6°F | Resp 18

## 2024-07-02 VITALS — BP 142/59 | HR 65

## 2024-07-02 DIAGNOSIS — R296 Repeated falls: Secondary | ICD-10-CM | POA: Diagnosis not present

## 2024-07-02 DIAGNOSIS — R2681 Unsteadiness on feet: Secondary | ICD-10-CM | POA: Insufficient documentation

## 2024-07-02 DIAGNOSIS — R2689 Other abnormalities of gait and mobility: Secondary | ICD-10-CM | POA: Diagnosis not present

## 2024-07-02 DIAGNOSIS — M6281 Muscle weakness (generalized): Secondary | ICD-10-CM | POA: Diagnosis not present

## 2024-07-02 DIAGNOSIS — R5383 Other fatigue: Secondary | ICD-10-CM | POA: Insufficient documentation

## 2024-07-02 NOTE — Therapy (Signed)
 OUTPATIENT PHYSICAL THERAPY NEURO EVALUATION   Patient Name: Devin Valdez MRN: [id number] DOB:[birth year], 83 y.o., male Today's Date: 07/02/2024   PCP: [name], MD REFERRING PROVIDER: [name], MD  END OF SESSION:  PT End of Session - 07/02/24 1534     Visit Number 1    Number of Visits 13   with eval   Date for PT Re-Evaluation 08/27/24    Authorization Type Medicare    PT Start Time 1532    PT Stop Time 1605   eval   PT Time Calculation (min) 33 min    Equipment Utilized During Treatment Gait belt    Activity Tolerance Patient tolerated treatment well    Behavior During Therapy WFL for tasks assessed/performed          Past Medical History:  Diagnosis Date   Anemia    Anxiety    Cervical stenosis of spinal canal    mri 8/19- severe   Depression    Falls    Hallucinations    Hyperlipidemia    Hypertension    Stroke (HCC)    left paracentral pons infarct   Past Surgical History:  Procedure Laterality Date   Carotid Dopplers  12/2019   CVA evaluation: Bilateral ICA <40%.  Bilateral vertebral arteries with normal flow.  Bilateral subclavian arteries are normal flow.   EYE SURGERY     HERNIA REPAIR     POSTERIOR CERVICAL LAMINECTOMY     with fusion C3-C7 10/19- Dr. Bufford at [facility] ECHOCARDIOGRAM  11/2019   (CVA evaluation) EF 55 to 60%.  GR 1 DD.  No RWMA.  Normal atrial sizes.  Mild aortic valve sclerosis otherwise normal valves.   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 eye  06/17/2020   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 04/08/2020   Vitreomacular adhesion of left eye 04/08/2020   Cystoid macular edema of left eye 04/08/2020   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HCC) 01/13/2020   Acute right-sided weakness 01/13/2020   Difficulty urinating 01/13/2020   Stroke (HCC)    CVA (cerebral vascular accident) (HCC) 12/20/2019   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HCC)    PVC (premature ventricular contraction)    Cervical stenosis of spinal canal    Synovitis of hand 09/12/2018   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/25/2018   Failure to thrive in adult 05/26/2017   Failure to thrive in pediatric patient 05/25/2017   Abdominal pulsatile mass 05/25/2017   Anemia 05/25/2017   Essential hypertension 05/25/2017   Gait instability 05/20/2017   Frequent falls 05/20/2017   Depression 05/20/2017    ONSET DATE: 06/14/2024 (referral date)  REFERRING DIAG: M62.81 (ICD-10-CM) - Muscle weakness R53.83 (ICD-10-CM) - Fatigue, unspecified type R26.81 (ICD-10-CM) - Gait instability R29.6 (ICD-10-CM) - Frequent falls  THERAPY DIAG:  Muscle weakness (generalized)  Other abnormalities of gait and mobility  Unsteadiness on feet  Rationale for Evaluation and Treatment: Rehabilitation  SUBJECTIVE:  SUBJECTIVE STATEMENT: Pt presents to PT evaluation with no AD with his wife, Devin Valdez. He reports having a history of a couple of strokes. He has been having more weakness in his legs and in his arms recently, reports no falls or no near falls. He also reports having a little bit of pain in his hands - arthritis. He used to be very active doing yardwork, etc. His wife reports that he also only drives short distances to the store now. Pt reports that he does not feel safe being as active as he was  before.  Pt accompanied by: significant other wife Devin Valdez  PERTINENT HISTORY: PMH: anemia, anxiety, cervical spinal stenosis, depression, HLD, HTN, stroke (2021)  Per PCP note 06/07/2024: Patient has a slow shuffling gait.  He has had this ever since his stroke although it seems slightly worse today.  This makes me concerned about possible parkinsonism.  Patient had the same presentation in December.  He minimizes symptoms and wife is obviously concerned.  She states that he just sits and watches TV all day.  He denies depression or anhedonia.  He denies memory loss and she agrees.  There is no tremor but there is some bradykinesia and postural instability.  I will check a CK level to evaluate for statin this myopathy along with a sedimentation rate.  Rarely with statin.  If labs are normal, I would recommend a neurology consultation for parkinsonian features as well as physical therapy for muscle weakness which I believe is primarily due to deconditioning.   PAIN:  Are you having pain? No  PRECAUTIONS: Fall  RED FLAGS: None   WEIGHT BEARING RESTRICTIONS: No  FALLS: Has patient fallen in last 6 months? No  LIVING ENVIRONMENT: Lives with: lives with their spouse Lives in: House/apartment Stairs: Yes: Internal: 7-10 steps; on right going up and External: 5-6 steps; on right going up Has following equipment at home: Single point cane and Walker - 2 wheeled - doesn't use  PLOF: Independent with gait and Independent with transfers  PATIENT GOALS: get me back to how I was  OBJECTIVE:  Note: Objective measures were completed at Evaluation unless otherwise noted.  DIAGNOSTIC FINDINGS: None updated or relevant to this POC  COGNITION: Overall cognitive status: Within functional limits for tasks assessed   SENSATION: Some N/T in hands, none in legs Light touch WFL in BLE  POSTURE: rounded shoulders, forward head, and posterior pelvic tilt  LOWER EXTREMITY ROM:     Active   Right Eval Left Eval  Hip flexion    Hip extension    Hip abduction    Hip adduction    Hip internal rotation    Hip external rotation    Knee flexion    Knee extension Tight HS Tight HS  Ankle dorsiflexion Tight gastroc Tight gastroc  Ankle plantarflexion    Ankle inversion    Ankle eversion     (Blank rows = not tested)  LOWER EXTREMITY MMT:    MMT Right Eval Left Eval  Hip flexion 4 4  Hip extension    Hip abduction    Hip adduction    Hip internal rotation    Hip external rotation    Knee flexion 4 4  Knee extension 4 4  Ankle dorsiflexion 5 5  Ankle plantarflexion    Ankle inversion    Ankle eversion    (Blank rows = not tested)  BED MOBILITY:  Has a little bit of trouble getting in/out of bed per  patient report  TRANSFERS: Sit to stand: Modified independence  Assistive device utilized: None     Stand to sit: Modified independence  Assistive device utilized: None     Chair to chair: Modified independence  Assistive device utilized: None       RAMP:  Not tested  CURB:  Not tested  STAIRS:  STAIRS:  Level of Assistance: CGA  Stair Negotiation Technique: Alternating Pattern  with Bilateral Rails  Number of Stairs: 6   Height of Stairs: 4  Comments: WFL  GAIT: Findings:  Gait pattern: decreased arm swing- Right, decreased arm swing- Left, decreased hip/knee flexion- Right, decreased hip/knee flexion- Left, decreased ankle dorsiflexion- Right, decreased ankle dorsiflexion- Left, poor foot clearance- Right, and poor foot clearance- Left Distance walked: various clinic distances Assistive device utilized: None Level of assistance: CGA Comments: CGA to due frequent catching of his toes on the floor causing a near fall   FUNCTIONAL TESTS:    [facility] PT Assessment - 07/02/24 1546       Ambulation/Gait   Gait velocity 32.8 ft over 10.72 sec = 3.06 ft/sec      Standardized Balance Assessment   Standardized Balance Assessment Timed Up and Go Test;Five  Times Sit to Stand;Mini-BESTest    Five times sit to stand comments  15.97 sec   no UE     Timed Up and Go Test   TUG Normal TUG    Normal TUG (seconds) 12.37   no AD     Functional Gait  Assessment   Gait assessed  Yes    Gait Level Surface Walks 20 ft, slow speed, abnormal gait pattern, evidence for imbalance or deviates 10-15 in outside of the 12 in walkway width. Requires more than 7 sec to ambulate 20 ft.    Change in Gait Speed Able to change speed, demonstrates mild gait deviations, deviates 6-10 in outside of the 12 in walkway width, or no gait deviations, unable to achieve a major change in velocity, or uses a change in velocity, or uses an assistive device.    Gait with Horizontal Head Turns Performs head turns with moderate changes in gait velocity, slows down, deviates 10-15 in outside 12 in walkway width but recovers, can continue to walk.    Gait with Vertical Head Turns Performs task with moderate change in gait velocity, slows down, deviates 10-15 in outside 12 in walkway width but recovers, can continue to walk.    Gait and Pivot Turn Pivot turns safely in greater than 3 sec and stops with no loss of balance, or pivot turns safely within 3 sec and stops with mild imbalance, requires small steps to catch balance.    Step Over Obstacle Is able to step over one shoe box (4.5 in total height) but must slow down and adjust steps to clear box safely. May require verbal cueing.    Gait with Narrow Base of Support Ambulates less than 4 steps heel to toe or cannot perform without assistance.    Gait with Eyes Closed Walks 20 ft, slow speed, abnormal gait pattern, evidence for imbalance, deviates 10-15 in outside 12 in walkway width. Requires more than 9 sec to ambulate 20 ft.    Ambulating Backwards Walks 20 ft, slow speed, abnormal gait pattern, evidence for imbalance, deviates 10-15 in outside 12 in walkway width.    Steps Alternating feet, must use rail.    Total Score 12    FGA  comment: 12/30, high fall risk  TREATMENT DATE:  PT Evaluation  Self-Care/Home Management: Vitals:   07/02/24 1544  BP: (!) 142/59  Pulse: 65   Assessed in LUE in sitting prior to intervention. Systolic BP slightly elevated, educated patient on safe BP ranges. Will continue to assess.   PATIENT EDUCATION: Education details: Eval findings, PT POC, results of OM and functional implications Person educated: Patient and Spouse Education method: Explanation and Demonstration Education comprehension: verbalized understanding, returned demonstration, and needs further education  HOME EXERCISE PROGRAM: To be initiated  GOALS: Goals reviewed with patient? Yes  SHORT TERM GOALS: Target date: 07/23/2024   Pt will be independent with initial HEP for improved strength, balance, transfers and gait. Baseline: Goal status: INITIAL  2.  Pt will improve gait velocity to at least 3.25 ft/sec for improved gait efficiency and performance at SBA level  Baseline: 3.05 ft/sec CGA (8/12) Goal status: INITIAL  3.  Pt will improve FGA to 16/30 for decreased fall risk  Baseline: 12/30 (8/12) Goal status: INITIAL  4.  miniBEST to be assessed and STG set Baseline:  Goal status: INITIAL    LONG TERM GOALS: Target date: 08/16/2024   Pt will be independent with final HEP for improved strength, balance, transfers and gait. Baseline:  Goal status: INITIAL  2.  Pt will improve gait velocity to at least 3.5 ft/sec for improved gait efficiency and performance at SBA level  Baseline: 3.05 ft/sec CGA (8/12) Goal status: INITIAL  3.  Pt will improve FGA to 20/30 for decreased fall risk  Baseline: 12/30 (8/12) Goal status: INITIAL  4.  miniBEST to be assessed and LTG set Baseline:  Goal status: INITIAL    ASSESSMENT:  CLINICAL IMPRESSION: Patient is a 83  year old male referred to Neuro OPPT for gait instabilities.   Pt's PMH is significant for: anemia, anxiety, cervical spinal stenosis, depression, HLD, HTN, stroke (2021). The following deficits were present during the exam: decreased LE strength, decreased LE ROM, and impaired balance. Based on his LE weakness, FGA score of 12/30, and gait abnormalities including frequent catching of his feet on the floor, pt is an increased risk for falls. Pt would benefit from skilled PT to address these impairments and functional limitations to maximize functional mobility independence.   OBJECTIVE IMPAIRMENTS: Abnormal gait, decreased balance, decreased mobility, decreased ROM, decreased strength, and impaired perceived functional ability.   ACTIVITY LIMITATIONS: carrying, lifting, bending, standing, squatting, stairs, and bed mobility  PARTICIPATION LIMITATIONS: meal prep, cleaning, laundry, driving, shopping, community activity, and yard work  PERSONAL FACTORS: Age, Time since onset of injury/illness/exacerbation, and 3+ comorbidities:   anemia, anxiety, cervical spinal stenosis, depression, HLD, HTN, stroke (2021)are also affecting patient's functional outcome.   REHAB POTENTIAL: Fair time since onset, motivation level  CLINICAL DECISION MAKING: Evolving/moderate complexity  EVALUATION COMPLEXITY: Moderate  PLAN:  PT FREQUENCY: 1-2x/week  PT DURATION: 6 weeks  PLANNED INTERVENTIONS: 97110-Therapeutic exercises, 97530- Therapeutic activity, 97112- Neuromuscular re-education, 97535- Self Care, and [id number]- Manual therapy  PLAN FOR NEXT SESSION: assess miniBest and write STG/LTG, initiate HEP to work on functional strengthening and to address gait and balance impairments (tends to catch his feet when walking, head turns, backwards, tandem), trial LRAD?, Parkinsonian type symptoms per PCP - referred to [facility] and scheduled in November   Waddell Southgate, PT Waddell Southgate, PT, DPT, CSRS  07/02/2024, 4:06  PM

## 2024-07-03 ENCOUNTER — Ambulatory Visit: Admitting: Physical Therapy

## 2024-07-04 ENCOUNTER — Ambulatory Visit: Admitting: Physical Therapy

## 2024-07-08 DIAGNOSIS — H4322 Crystalline deposits in vitreous body, left eye: Secondary | ICD-10-CM | POA: Diagnosis not present

## 2024-07-08 DIAGNOSIS — H353112 Nonexudative age-related macular degeneration, right eye, intermediate dry stage: Secondary | ICD-10-CM | POA: Diagnosis not present

## 2024-07-08 DIAGNOSIS — H43822 Vitreomacular adhesion, left eye: Secondary | ICD-10-CM | POA: Diagnosis not present

## 2024-07-08 DIAGNOSIS — H35352 Cystoid macular degeneration, left eye: Secondary | ICD-10-CM | POA: Diagnosis not present

## 2024-07-08 DIAGNOSIS — H353132 Nonexudative age-related macular degeneration, bilateral, intermediate dry stage: Secondary | ICD-10-CM | POA: Diagnosis not present

## 2024-07-08 DIAGNOSIS — H34832 Tributary (branch) retinal vein occlusion, left eye, with macular edema: Secondary | ICD-10-CM | POA: Diagnosis not present

## 2024-07-08 DIAGNOSIS — H348112 Central retinal vein occlusion, right eye, stable: Secondary | ICD-10-CM | POA: Diagnosis not present

## 2024-07-08 DIAGNOSIS — H353122 Nonexudative age-related macular degeneration, left eye, intermediate dry stage: Secondary | ICD-10-CM | POA: Diagnosis not present

## 2024-07-09 ENCOUNTER — Ambulatory Visit: Admitting: Physical Therapy

## 2024-07-09 VITALS — BP 140/60 | HR 67

## 2024-07-09 DIAGNOSIS — R296 Repeated falls: Secondary | ICD-10-CM | POA: Diagnosis not present

## 2024-07-09 DIAGNOSIS — M6281 Muscle weakness (generalized): Secondary | ICD-10-CM

## 2024-07-09 DIAGNOSIS — R2689 Other abnormalities of gait and mobility: Secondary | ICD-10-CM

## 2024-07-09 DIAGNOSIS — R2681 Unsteadiness on feet: Secondary | ICD-10-CM | POA: Diagnosis not present

## 2024-07-09 DIAGNOSIS — R5383 Other fatigue: Secondary | ICD-10-CM | POA: Diagnosis not present

## 2024-07-09 NOTE — Therapy (Signed)
 OUTPATIENT PHYSICAL THERAPY NEURO TREATMENT   Patient Name: Devin Valdez MRN: [id number] DOB:10-Feb-1941, 83 y.o., male Today's Date: 07/09/2024   PCP: [name], MD REFERRING PROVIDER: [name], MD  END OF SESSION:  PT End of Session - 07/09/24 1531     Visit Number 2    Number of Visits 13   with eval   Date for PT Re-Evaluation 08/27/24    Authorization Type Medicare    PT Start Time 1530    PT Stop Time 1609    PT Time Calculation (min) 39 min    Equipment Utilized During Treatment Gait belt    Activity Tolerance Patient tolerated treatment well    Behavior During Therapy WFL for tasks assessed/performed           Past Medical History:  Diagnosis Date   Anemia    Anxiety    Cervical stenosis of spinal canal    mri 8/19- severe   Depression    Falls    Hallucinations    Hyperlipidemia    Hypertension    Stroke (HCC)    left paracentral pons infarct   Past Surgical History:  Procedure Laterality Date   Carotid Dopplers  12/2019   CVA evaluation: Bilateral ICA <40%.  Bilateral vertebral arteries with normal flow.  Bilateral subclavian arteries are normal flow.   EYE SURGERY     HERNIA REPAIR     POSTERIOR CERVICAL LAMINECTOMY     with fusion C3-C7 10/19- Dr. Bufford at [facility] ECHOCARDIOGRAM  11/2019   (CVA evaluation) EF 55 to 60%.  GR 1 DD.  No RWMA.  Normal atrial sizes.  Mild aortic valve sclerosis otherwise normal valves.   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 eye  06/17/2020   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 04/08/2020   Vitreomacular adhesion of left eye 04/08/2020   Cystoid macular edema of left eye 04/08/2020   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HCC) 01/13/2020   Acute right-sided weakness 01/13/2020   Difficulty urinating 01/13/2020   Stroke (HCC)    CVA (cerebral vascular accident) (HCC) 12/20/2019   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HCC)    PVC (premature ventricular contraction)    Cervical stenosis of spinal canal    Synovitis of hand 09/12/2018   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/25/2018   Failure to thrive in adult 05/26/2017   Failure to thrive in pediatric patient 05/25/2017   Abdominal pulsatile mass 05/25/2017   Anemia 05/25/2017   Essential hypertension 05/25/2017   Gait instability 05/20/2017   Frequent falls 05/20/2017   Depression 05/20/2017    ONSET DATE: 06/14/2024 (referral date)  REFERRING DIAG: M62.81 (ICD-10-CM) - Muscle weakness R53.83 (ICD-10-CM) - Fatigue, unspecified type R26.81 (ICD-10-CM) - Gait instability R29.6 (ICD-10-CM) - Frequent falls  THERAPY DIAG:  Muscle weakness (generalized)  Other abnormalities of gait and mobility  Unsteadiness on feet  Rationale for Evaluation and Treatment: Rehabilitation  SUBJECTIVE:  SUBJECTIVE STATEMENT: [name]  Pt denies any acute changes since last visit. Denies any pain today.  Pt enjoyed golfing and bowling but has been a long time since he did those activities.  Pt accompanied by: significant other [name] (in lobby)  PERTINENT HISTORY: PMH: anemia, anxiety, cervical spinal stenosis, depression, HLD, HTN, stroke (2021)  Per PCP note 06/07/2024: Patient has a slow shuffling gait.  He has had this ever since his stroke although it seems slightly worse today.   This makes me concerned about possible parkinsonism.  Patient had the same presentation in December.  He minimizes symptoms and wife is obviously concerned.  She states that he just sits and watches TV all day.  He denies depression or anhedonia.  He denies memory loss and she agrees.  There is no tremor but there is some bradykinesia and postural instability.  I will check a CK level to evaluate for statin this myopathy along with a sedimentation rate.  Rarely with statin.  If labs are normal, I would recommend a neurology consultation for parkinsonian features as well as physical therapy for muscle weakness which I believe is primarily due to deconditioning.   PAIN:  Are you having pain? No  PRECAUTIONS: Fall  RED FLAGS: None   WEIGHT BEARING RESTRICTIONS: No  FALLS: Has patient fallen in last 6 months? No  LIVING ENVIRONMENT: Lives with: lives with their spouse Lives in: House/apartment Stairs: Yes: Internal: 7-10 steps; on right going up and External: 5-6 steps; on right going up Has following equipment at home: Single point cane and Walker - 2 wheeled - doesn't use  PLOF: Independent with gait and Independent with transfers  PATIENT GOALS: get me back to how I was  OBJECTIVE:  Note: Objective measures were completed at Evaluation unless otherwise noted.  DIAGNOSTIC FINDINGS: None updated or relevant to this POC  COGNITION: Overall cognitive status: Within functional limits for tasks assessed   SENSATION: Some N/T in hands, none in legs Light touch WFL in BLE  POSTURE: rounded shoulders, forward head, and posterior pelvic tilt  LOWER EXTREMITY ROM:     Active  Right Eval Left Eval  Hip flexion    Hip extension    Hip abduction    Hip adduction    Hip internal rotation    Hip external rotation    Knee flexion    Knee extension Tight HS Tight HS  Ankle dorsiflexion Tight gastroc Tight gastroc  Ankle plantarflexion    Ankle inversion    Ankle eversion      (Blank rows = not tested)  LOWER EXTREMITY MMT:    MMT Right Eval Left Eval  Hip flexion 4 4  Hip extension    Hip abduction    Hip adduction    Hip internal rotation    Hip external rotation    Knee flexion 4 4  Knee extension 4 4  Ankle dorsiflexion 5 5  Ankle plantarflexion    Ankle inversion    Ankle eversion    (Blank rows = not tested)  BED MOBILITY:  Has a little bit of trouble getting in/out of bed per patient report  TRANSFERS: Sit to stand: Modified independence  Assistive device utilized: None     Stand to sit: Modified independence  Assistive device utilized: None     Chair to chair: Modified independence  Assistive device utilized: None       RAMP:  Not tested  CURB:  Not tested  STAIRS:  STAIRS:  Level of Assistance: CGA  Stair Negotiation Technique: Alternating Pattern  with Bilateral Rails  Number of Stairs: 6   Height of Stairs: 4  Comments: WFL  GAIT: Findings:  Gait pattern: decreased arm swing- Right, decreased arm swing- Left, decreased hip/knee flexion- Right, decreased hip/knee flexion- Left, decreased ankle dorsiflexion- Right, decreased ankle dorsiflexion- Left, poor foot clearance- Right, and poor foot clearance- Left Distance walked: various clinic distances Assistive device utilized: None Level of assistance: CGA Comments: CGA to due frequent catching of his toes on the floor causing a near fall   FUNCTIONAL TESTS:    [facility] PT Assessment - 07/09/24 1535       Mini-BESTest   Sit To Stand Moderate: Comes to stand WITH use of hands on first attempt.    Rise to Toes Moderate: Heels up, but not full range (smaller than when holding hands), OR noticeable instability for 3 s.    Stand on one leg (left) Moderate: < 20 s   5 sec   Stand on one leg (right) Moderate: < 20 s   2 sec   Stand on one leg - lowest score 1    Compensatory Stepping Correction - Forward Moderate: More than one step is required to recover equilibrium     Compensatory Stepping Correction - Backward No step, OR would fall if not caught, OR falls spontaneously.    Compensatory Stepping Correction - Left Lateral Moderate: Several steps to recover equilibrium    Compensatory Stepping Correction - Right Lateral Moderate: Several steps to recover equilibrium    Stepping Corredtion Lateral - lowest score 1    Stance - Feet together, eyes open, firm surface  Normal: 30s    Stance - Feet together, eyes closed, foam surface  Normal: 30s    Incline - Eyes Closed Moderate: Stands independently < 30s OR aligns with surface    Change in Gait Speed Normal: Significantly changes walkling speed without imbalance    Walk with head turns - Horizontal Severe: performs head turns with imbalance    Walk with pivot turns Normal: Turns with feet close FAST (< 3 steps) with good balance.    Step over obstacles Normal: Able to step over box with minimal change of gait speed and with good balance.    Timed UP & GO with Dual Task Moderate: Dual Task affects either counting OR walking (>10%) when compared to the TUG without Dual Task.   counting backwards by 3's starting at 90   Mini-BEST total score 17                                                                                                                                       TREATMENT DATE:    Self-Care/Home Management: Vitals:   07/09/24 1534  BP: (!) 140/60  Pulse: 67   Assessed in LUE in sitting prior to intervention. WFL for safe participation in PT  session.  TherAct Corner balance: Wide tandem 3 x 30 sec each B Added to HEP, see bolded below  Physical Performance  [facility] PT Assessment - 07/09/24 1535       Mini-BESTest   Sit To Stand Moderate: Comes to stand WITH use of hands on first attempt.    Rise to Toes Moderate: Heels up, but not full range (smaller than when holding hands), OR noticeable instability for 3 s.    Stand on one leg (left) Moderate: < 20 s   5 sec   Stand on one leg  (right) Moderate: < 20 s   2 sec   Stand on one leg - lowest score 1    Compensatory Stepping Correction - Forward Moderate: More than one step is required to recover equilibrium    Compensatory Stepping Correction - Backward No step, OR would fall if not caught, OR falls spontaneously.    Compensatory Stepping Correction - Left Lateral Moderate: Several steps to recover equilibrium    Compensatory Stepping Correction - Right Lateral Moderate: Several steps to recover equilibrium    Stepping Corredtion Lateral - lowest score 1    Stance - Feet together, eyes open, firm surface  Normal: 30s    Stance - Feet together, eyes closed, foam surface  Normal: 30s    Incline - Eyes Closed Moderate: Stands independently < 30s OR aligns with surface    Change in Gait Speed Normal: Significantly changes walkling speed without imbalance    Walk with head turns - Horizontal Severe: performs head turns with imbalance    Walk with pivot turns Normal: Turns with feet close FAST (< 3 steps) with good balance.    Step over obstacles Normal: Able to step over box with minimal change of gait speed and with good balance.    Timed UP & GO with Dual Task Moderate: Dual Task affects either counting OR walking (>10%) when compared to the TUG without Dual Task.   counting backwards by 3's starting at 90   Mini-BEST total score 17            PATIENT EDUCATION: Education details: results of OM and functional implications, initial HEP Person educated: Patient Education method: Explanation, Demonstration, Tactile cues, Verbal cues, and Handouts Education comprehension: verbalized understanding, returned demonstration, and needs further education  HOME EXERCISE PROGRAM: Access Code: [email or web address] URL: [email or web address] Date: 07/09/2024 Prepared by: Waddell Southgate  Exercises - Wide Tandem Stance with Eyes Open  - 1 x daily - 7 x weekly - 1 sets - 3-5 reps - 30 sec hold  GOALS: Goals reviewed with  patient? Yes  SHORT TERM GOALS: Target date: 07/23/2024   Pt will be independent with initial HEP for improved strength, balance, transfers and gait. Baseline: Goal status: INITIAL  2.  Pt will improve gait velocity to at least 3.25 ft/sec for improved gait efficiency and performance at SBA level  Baseline: 3.05 ft/sec CGA (8/12) Goal status: INITIAL  3.  Pt will improve FGA to 16/30 for decreased fall risk  Baseline: 12/30 (8/12) Goal status: INITIAL  4.  Pt will improve MiniBest to 19/28 for decreased fall risk and improvement with compensatory stepping strategies.  Baseline: 17/28 (8/19) Goal status: INITIAL    LONG TERM GOALS: Target date: 08/16/2024   Pt will be independent with final HEP for improved strength, balance, transfers and gait. Baseline:  Goal status: INITIAL  2.  Pt will improve gait velocity to at least 3.5 ft/sec for improved gait efficiency and  performance at SBA level  Baseline: 3.05 ft/sec CGA (8/12) Goal status: INITIAL  3.  Pt will improve FGA to 20/30 for decreased fall risk  Baseline: 12/30 (8/12) Goal status: INITIAL  4.  Pt will improve MiniBest to 22/28 for decreased fall risk and improvement with compensatory stepping strategies.  Baseline: 17/28 (8/19) Goal status: INITIAL    ASSESSMENT:  CLINICAL IMPRESSION: Emphasis of skilled PT session on assessing miniBEST and creating STG and LTG for improved balance and initiating his HEP to address balance impairments. He is a high fall risk based on his score on the minBEST, reviewed score and functional implications with patient. Pt is challenged by wide tandem stance so added to his HEP. He can benefit from further trials of various balance and strengthening exercises to add to his HEP to continue to work on improving his balance and decreasing his fall risk. Continue POC.    OBJECTIVE IMPAIRMENTS: Abnormal gait, decreased balance, decreased mobility, decreased ROM, decreased strength, and  impaired perceived functional ability.   ACTIVITY LIMITATIONS: carrying, lifting, bending, standing, squatting, stairs, and bed mobility  PARTICIPATION LIMITATIONS: meal prep, cleaning, laundry, driving, shopping, community activity, and yard work  PERSONAL FACTORS: Age, Time since onset of injury/illness/exacerbation, and 3+ comorbidities:   anemia, anxiety, cervical spinal stenosis, depression, HLD, HTN, stroke (2021)are also affecting patient's functional outcome.   REHAB POTENTIAL: Fair time since onset, motivation level  CLINICAL DECISION MAKING: Evolving/moderate complexity  EVALUATION COMPLEXITY: Moderate  PLAN:  PT FREQUENCY: 1-2x/week  PT DURATION: 6 weeks  PLANNED INTERVENTIONS: 97110-Therapeutic exercises, 97530- Therapeutic activity, 97112- Neuromuscular re-education, 97535- Self Care, and [id number]- Manual therapy  PLAN FOR NEXT SESSION: add to HEP to work on functional strengthening and to address gait and balance impairments (tends to catch his feet when walking, head turns, backwards, tandem), trial LRAD?, Parkinsonian type symptoms per PCP - referred to [facility] and scheduled in November, tends to lose his balance posteriorly, stepping strategy, cog dual tasking   Waddell Southgate, PT Waddell Southgate, PT, DPT, CSRS  07/09/2024, 4:10 PM

## 2024-07-12 ENCOUNTER — Ambulatory Visit: Admitting: Physical Therapy

## 2024-07-12 ENCOUNTER — Telehealth: Payer: Self-pay | Admitting: Physical Therapy

## 2024-07-12 NOTE — Telephone Encounter (Signed)
 Called pt's wife and left VM regarding no-show to scheduled PT session. Reminded her of pt's next appointment date and time and provided callback number to call and cancel appointments if needed in future.   Demica Zook E Chandel Zaun, PT, DPT

## 2024-07-16 ENCOUNTER — Other Ambulatory Visit: Payer: Self-pay | Admitting: Family Medicine

## 2024-07-16 DIAGNOSIS — I10 Essential (primary) hypertension: Secondary | ICD-10-CM

## 2024-07-17 ENCOUNTER — Ambulatory Visit: Admitting: Physical Therapy

## 2024-07-17 VITALS — BP 159/68 | HR 79

## 2024-07-17 DIAGNOSIS — R2681 Unsteadiness on feet: Secondary | ICD-10-CM

## 2024-07-17 DIAGNOSIS — R2689 Other abnormalities of gait and mobility: Secondary | ICD-10-CM | POA: Diagnosis not present

## 2024-07-17 DIAGNOSIS — M6281 Muscle weakness (generalized): Secondary | ICD-10-CM

## 2024-07-17 DIAGNOSIS — R296 Repeated falls: Secondary | ICD-10-CM | POA: Diagnosis not present

## 2024-07-17 DIAGNOSIS — R5383 Other fatigue: Secondary | ICD-10-CM | POA: Diagnosis not present

## 2024-07-17 NOTE — Therapy (Signed)
 OUTPATIENT PHYSICAL THERAPY NEURO TREATMENT   Patient Name: Devin Valdez MRN: [id number] DOB:1941/11/04, 83 y.o., male Today's Date: 07/17/2024   PCP: [name], MD REFERRING PROVIDER: [name], MD  END OF SESSION:  PT End of Session - 07/17/24 1449     Visit Number 3    Number of Visits 13   with eval   Date for PT Re-Evaluation 08/27/24    Authorization Type Medicare    PT Start Time 1446    PT Stop Time 1526    PT Time Calculation (min) 40 min    Equipment Utilized During Treatment Gait belt    Activity Tolerance Patient tolerated treatment well    Behavior During Therapy WFL for tasks assessed/performed            Past Medical History:  Diagnosis Date   Anemia    Anxiety    Cervical stenosis of spinal canal    mri 8/19- severe   Depression    Falls    Hallucinations    Hyperlipidemia    Hypertension    Stroke (HCC)    left paracentral pons infarct   Past Surgical History:  Procedure Laterality Date   Carotid Dopplers  12/2019   CVA evaluation: Bilateral ICA <40%.  Bilateral vertebral arteries with normal flow.  Bilateral subclavian arteries are normal flow.   EYE SURGERY     HERNIA REPAIR     POSTERIOR CERVICAL LAMINECTOMY     with fusion C3-C7 10/19- Dr. Bufford at [facility] ECHOCARDIOGRAM  11/2019   (CVA evaluation) EF 55 to 60%.  GR 1 DD.  No RWMA.  Normal atrial sizes.  Mild aortic valve sclerosis otherwise normal valves.   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 eye  06/17/2020   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 04/08/2020   Vitreomacular adhesion of left eye 04/08/2020   Cystoid macular edema of left eye 04/08/2020   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HCC) 01/13/2020   Acute right-sided weakness 01/13/2020   Difficulty urinating 01/13/2020   Stroke (HCC)    CVA (cerebral vascular accident) (HCC) 12/20/2019   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HCC)    PVC (premature ventricular contraction)    Cervical stenosis of spinal canal    Synovitis of hand 09/12/2018   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/25/2018   Failure to thrive in adult 05/26/2017   Failure to thrive in pediatric patient 05/25/2017   Abdominal pulsatile mass 05/25/2017   Anemia 05/25/2017   Essential hypertension 05/25/2017   Gait instability 05/20/2017   Frequent falls 05/20/2017   Depression 05/20/2017    ONSET DATE: 06/14/2024 (referral date)  REFERRING DIAG: M62.81 (ICD-10-CM) - Muscle weakness R53.83 (ICD-10-CM) - Fatigue, unspecified type R26.81 (ICD-10-CM) - Gait instability R29.6 (ICD-10-CM) - Frequent falls  THERAPY DIAG:  Muscle weakness (generalized)  Other abnormalities of gait and mobility  Unsteadiness on feet  Rationale for Evaluation and Treatment: Rehabilitation  SUBJECTIVE:  SUBJECTIVE STATEMENT: [name]  Pt denies any acute changes since last visit. Has achy feet but is unable to rate his pain.   States his HEP is hard to do. It is fine when you are in here and have someone to help you but not when you are doing them by yourself.    Pt accompanied by: significant other [name] (in parking lot)  PERTINENT HISTORY: PMH: anemia, anxiety, cervical spinal stenosis, depression, HLD, HTN, stroke (2021)  Per PCP note 06/07/2024: Patient has a slow shuffling  gait.  He has had this ever since his stroke although it seems slightly worse today.  This makes me concerned about possible parkinsonism.  Patient had the same presentation in December.  He minimizes symptoms and wife is obviously concerned.  She states that he just sits and watches TV all day.  He denies depression or anhedonia.  He denies memory loss and she agrees.  There is no tremor but there is some bradykinesia and postural instability.  I will check a CK level to evaluate for statin this myopathy along with a sedimentation rate.  Rarely with statin.  If labs are normal, I would recommend a neurology consultation for parkinsonian features as well as physical therapy for muscle weakness which I believe is primarily due to deconditioning.   PAIN:  Are you having pain? No  PRECAUTIONS: Fall  RED FLAGS: None   WEIGHT BEARING RESTRICTIONS: No  FALLS: Has patient fallen in last 6 months? No  LIVING ENVIRONMENT: Lives with: lives with their spouse Lives in: House/apartment Stairs: Yes: Internal: 7-10 steps; on right going up and External: 5-6 steps; on right going up Has following equipment at home: Single point cane and Walker - 2 wheeled - doesn't use  PLOF: Independent with gait and Independent with transfers  PATIENT GOALS: get me back to how I was  OBJECTIVE:  Note: Objective measures were completed at Evaluation unless otherwise noted.  DIAGNOSTIC FINDINGS: None updated or relevant to this POC  COGNITION: Overall cognitive status: Within functional limits for tasks assessed   SENSATION: Some N/T in hands, none in legs Light touch WFL in BLE  POSTURE: rounded shoulders, forward head, and posterior pelvic tilt  LOWER EXTREMITY ROM:     Active  Right Eval Left Eval  Hip flexion    Hip extension    Hip abduction    Hip adduction    Hip internal rotation    Hip external rotation    Knee flexion    Knee extension Tight HS Tight HS  Ankle dorsiflexion Tight  gastroc Tight gastroc  Ankle plantarflexion    Ankle inversion    Ankle eversion     (Blank rows = not tested)  LOWER EXTREMITY MMT:    MMT Right Eval Left Eval  Hip flexion 4 4  Hip extension    Hip abduction    Hip adduction    Hip internal rotation    Hip external rotation    Knee flexion 4 4  Knee extension 4 4  Ankle dorsiflexion 5 5  Ankle plantarflexion    Ankle inversion    Ankle eversion    (Blank rows = not tested)  BED MOBILITY:  Has a little bit of trouble getting in/out of bed per patient report  TRANSFERS: Sit to stand: Modified independence  Assistive device utilized: None     Stand to sit: Modified independence  Assistive device utilized: None     Chair to chair: Modified independence  Assistive device utilized: None       RAMP:  Not tested  CURB:  Not tested  STAIRS:  STAIRS:  Level of Assistance: CGA  Stair Negotiation Technique: Alternating Pattern  with Bilateral Rails  Number of Stairs: 6   Height of Stairs: 4  Comments: WFL  GAIT: Findings:  Gait pattern: decreased arm swing- Right, decreased arm swing- Left, decreased hip/knee flexion- Right, decreased hip/knee flexion- Left, decreased ankle dorsiflexion- Right, decreased ankle dorsiflexion- Left, poor foot clearance- Right, and poor foot clearance- Left Distance walked: various clinic distances Assistive device utilized: None Level of assistance: CGA Comments: CGA to due frequent catching of his toes on the floor causing a near fall   FUNCTIONAL TESTS:     VITALS  Vitals:   07/17/24 1451 07/17/24 1504  BP: (!) 161/67 (!) 159/68 Comment: Following SciFit  Pulse: 69 79                                                                                                                                 TREATMENT DATE:    Self-Care/Home Management: Assessed in LUE in sitting prior to intervention. Systolic BP elevated but narrowly within limits for session, so continued to monitor  throughout session.   TherAct SciFit multi-peaks level 5.5 for 8 minutes using BUE/BLEs for neural priming for reciprocal movement, dynamic cardiovascular warmup and increased amplitude of stepping. RPE of 7/10 following activity. Reassessed vitals following activity (see above) and systolic BP still elevated but within limits for session.   NMR  In // bars on rockerboard for improved vestibular input, facilitation of ankle strategy and fwd/retro stepping strategy:  Standing w/EO and no UE support, x3 minutes. Pt demonstrating absent ankle strategy and hip strategy, allowing himself to fall backwards and catch himself via UE support on rails. Max multimodal cues provided to facilitate ankle strategy, but pt unable to perform. Had pt work on rocking forward and backward on board using only his feet, but pt performing via hip strategy/truncal lean instead despite max cues. CGA-min A throughout for safety  Alt fwd/retro step off board w/BUE support and progressing to no UE support, x15 reps per side per direction. Pt relies heavily on BUE support to perform and demonstrates narrow foot placement of RLE on board, frequently catching his L foot on his R heel. When cued to perform without UE support, pt resumes allowing himself to fall backwards w/absent ankle/hip strategy noted to attempt to regain balance. Min A for safety throughout  Standing w/EO and horizontal head turns, x4 minutes with strong emphasis on facilitating ankle strategy to correct balance. Educated pt on importance of ankle strategy when walking on unlevel surfaces and for general balance, as pt does not use AD currently. Pt reports if he would lose his balance while walking, I will just fall. Pt frequently losing balance posteriorly w/activity but was able to facilitate some ankle strategy w/cues to press the gas pedal.  Reviewed HEP as pt reports it is too hard and pt unable to remember his exercise. Pt then demonstrating wide tandem  stance while arching backwards into wall. Educated pt on proper technique for exercise and he was able to perform well, but question pt's safety performing HEP at home due to cognitive impairment.    PATIENT EDUCATION: Education details: Review of HEP, importance of ankle strategy  Person educated: Patient Education method: Explanation, Demonstration, Tactile cues, and Verbal cues Education comprehension: verbalized understanding, returned demonstration, verbal cues required, tactile cues required, and needs further education  HOME EXERCISE PROGRAM: Access Code: [email or web address] URL: [email or web address] Date: 07/09/2024 Prepared by: Waddell Southgate  Exercises - Wide Tandem Stance with Eyes Open  - 1 x daily - 7 x weekly - 1 sets - 3-5 reps - 30 sec hold  GOALS: Goals reviewed with patient? Yes  SHORT TERM GOALS: Target date: 07/23/2024   Pt will be independent with initial HEP for improved strength, balance, transfers and gait. Baseline: Goal status: INITIAL  2.  Pt will improve gait velocity to at least 3.25 ft/sec for improved gait efficiency and performance at SBA level  Baseline: 3.05 ft/sec CGA (8/12) Goal status: INITIAL  3.  Pt will improve FGA to 16/30 for decreased fall risk  Baseline: 12/30 (8/12) Goal status: INITIAL  4.  Pt will improve MiniBest to 19/28 for decreased fall risk and improvement with compensatory stepping strategies.  Baseline: 17/28 (8/19) Goal status: INITIAL    LONG TERM GOALS: Target date: 08/16/2024   Pt will be independent with final HEP for improved strength, balance, transfers and gait. Baseline:  Goal status: INITIAL  2.  Pt will improve gait velocity to at least 3.5 ft/sec for improved gait efficiency and performance at SBA level  Baseline: 3.05 ft/sec CGA (8/12) Goal status: INITIAL  3.  Pt will improve FGA to 20/30 for decreased fall risk  Baseline: 12/30 (8/12) Goal status: INITIAL  4.  Pt will improve MiniBest to  22/28 for decreased fall risk and improvement with compensatory stepping strategies.  Baseline: 17/28 (8/19) Goal status: INITIAL    ASSESSMENT:  CLINICAL IMPRESSION: Emphasis of skilled PT session on facilitating ankle strategy, improved vestibular input and reviewing HEP. Pt greatly limited by absent ankle and hip strategy, allowing himself to fall when he loses balance and relying heavily on grabbing onto objects to stabilize. Pt unable to recall his HEP despite telling therapist it was hard and demonstrated unsafe standing position when showing therapist how he has been performing at home. Question pt's safety w/HEP and pt's wife not present for session. Continue POC.    OBJECTIVE IMPAIRMENTS: Abnormal gait, decreased balance, decreased mobility, decreased ROM, decreased strength, and impaired perceived functional ability.   ACTIVITY LIMITATIONS: carrying, lifting, bending, standing, squatting, stairs, and bed mobility  PARTICIPATION LIMITATIONS: meal prep, cleaning, laundry, driving, shopping, community activity, and yard work  PERSONAL FACTORS: Age, Time since onset of injury/illness/exacerbation, and 3+ comorbidities:   anemia, anxiety, cervical spinal stenosis, depression, HLD, HTN, stroke (2021)are also affecting patient's functional outcome.   REHAB POTENTIAL: Fair time since onset, motivation level  CLINICAL DECISION MAKING: Evolving/moderate complexity  EVALUATION COMPLEXITY: Moderate  PLAN:  PT FREQUENCY: 1-2x/week  PT DURATION: 6 weeks  PLANNED INTERVENTIONS: 97110-Therapeutic exercises, 97530- Therapeutic activity, [id number]- Neuromuscular re-education, 97535- Self Care, and [id number]- Manual therapy  PLAN FOR NEXT SESSION: add to HEP to work on functional strengthening and to address gait and balance impairments (tends to catch his feet when walking, head  turns, backwards, tandem- pt has poor retention, likely need to have wife present for this), trial LRAD?, Parkinsonian  type symptoms per PCP - referred to [facility] and scheduled in November, tends to lose his balance posteriorly, stepping strategy, cog dual tasking, work on facilitation of ankle/hip strategy    Aaleyah Witherow E Kaylum Shrum, PT, DPT  07/17/2024, 3:31 PM

## 2024-07-19 ENCOUNTER — Ambulatory Visit: Admitting: Physical Therapy

## 2024-07-19 VITALS — BP 148/77 | HR 72

## 2024-07-19 DIAGNOSIS — R2681 Unsteadiness on feet: Secondary | ICD-10-CM | POA: Diagnosis not present

## 2024-07-19 DIAGNOSIS — R296 Repeated falls: Secondary | ICD-10-CM | POA: Diagnosis not present

## 2024-07-19 DIAGNOSIS — M6281 Muscle weakness (generalized): Secondary | ICD-10-CM

## 2024-07-19 DIAGNOSIS — R2689 Other abnormalities of gait and mobility: Secondary | ICD-10-CM

## 2024-07-19 DIAGNOSIS — R5383 Other fatigue: Secondary | ICD-10-CM | POA: Diagnosis not present

## 2024-07-19 NOTE — Therapy (Signed)
 OUTPATIENT PHYSICAL THERAPY NEURO TREATMENT   Patient Name: Devin Valdez MRN: [id number] DOB:[birth year], 83 y.o., male Today's Date: 07/19/2024   PCP: Devin Butler DASEN, MD REFERRING PROVIDER: [name], MD  END OF SESSION:  PT End of Session - 07/19/24 1317     Visit Number 4    Number of Visits 13   with eval   Date for PT Re-Evaluation 08/27/24    Authorization Type Medicare    PT Start Time 1315    PT Stop Time 1400    PT Time Calculation (min) 45 min    Equipment Utilized During Treatment Gait belt    Activity Tolerance Patient tolerated treatment well    Behavior During Therapy WFL for tasks assessed/performed             Past Medical History:  Diagnosis Date   Anemia    Anxiety    Cervical stenosis of spinal canal    mri 8/19- severe   Depression    Falls    Hallucinations    Hyperlipidemia    Hypertension    Stroke (HCC)    left paracentral pons infarct   Past Surgical History:  Procedure Laterality Date   Carotid Dopplers  12/2019   CVA evaluation: Bilateral ICA <40%.  Bilateral vertebral arteries with normal flow.  Bilateral subclavian arteries are normal flow.   EYE SURGERY     HERNIA REPAIR     POSTERIOR CERVICAL LAMINECTOMY     with fusion C3-C7 10/19- Dr. Bufford at [facility] ECHOCARDIOGRAM  11/2019   (CVA evaluation) EF 55 to 60%.  GR 1 DD.  No RWMA.  Normal atrial sizes.  Mild aortic valve sclerosis otherwise normal valves.   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 eye  06/17/2020   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 04/08/2020   Vitreomacular adhesion of left eye 04/08/2020   Cystoid macular edema of left eye 04/08/2020   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HCC) 01/13/2020   Acute right-sided weakness 01/13/2020   Difficulty urinating 01/13/2020   Stroke (HCC)    CVA (cerebral vascular accident) (HCC) 12/20/2019   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HCC)    PVC (premature ventricular contraction)    Cervical stenosis of spinal canal    Synovitis of hand 09/12/2018   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/25/2018   Failure to thrive in adult 05/26/2017   Failure to thrive in pediatric patient 05/25/2017   Abdominal pulsatile mass 05/25/2017   Anemia 05/25/2017   Essential hypertension 05/25/2017   Gait instability 05/20/2017   Frequent falls 05/20/2017   Depression 05/20/2017    ONSET DATE: 06/14/2024 (referral date)  REFERRING DIAG: M62.81 (ICD-10-CM) - Muscle weakness R53.83 (ICD-10-CM) - Fatigue, unspecified type R26.81 (ICD-10-CM) - Gait instability R29.6 (ICD-10-CM) - Frequent falls  THERAPY DIAG:  Muscle weakness (generalized)  Other abnormalities of gait and mobility  Unsteadiness on feet  Rationale for Evaluation and Treatment: Rehabilitation  SUBJECTIVE:  SUBJECTIVE STATEMENT: [name]  Pt denies any falls or acute changes since last visit. Pt denies any pain today, then states he might have some in his hands from arthritis.   Pt accompanied by: significant other [name] (in lobby)  PERTINENT HISTORY: PMH: anemia, anxiety, cervical spinal stenosis, depression, HLD, HTN, stroke (2021)  Per PCP note 06/07/2024: Patient has a slow shuffling gait.  He has had this ever since his stroke although it seems slightly worse today.  This makes me  concerned about possible parkinsonism.  Patient had the same presentation in December.  He minimizes symptoms and wife is obviously concerned.  She states that he just sits and watches TV all day.  He denies depression or anhedonia.  He denies memory loss and she agrees.  There is no tremor but there is some bradykinesia and postural instability.  I will check a CK level to evaluate for statin this myopathy along with a sedimentation rate.  Rarely with statin.  If labs are normal, I would recommend a neurology consultation for parkinsonian features as well as physical therapy for muscle weakness which I believe is primarily due to deconditioning.   PAIN:  Are you having pain? No  PRECAUTIONS: Fall  RED FLAGS: None   WEIGHT BEARING RESTRICTIONS: No  FALLS: Has patient fallen in last 6 months? No  LIVING ENVIRONMENT: Lives with: lives with their spouse Lives in: House/apartment Stairs: Yes: Internal: 7-10 steps; on right going up and External: 5-6 steps; on right going up Has following equipment at home: Single point cane and Walker - 2 wheeled - doesn't use  PLOF: Independent with gait and Independent with transfers  PATIENT GOALS: get me back to how I was  OBJECTIVE:  Note: Objective measures were completed at Evaluation unless otherwise noted.  DIAGNOSTIC FINDINGS: None updated or relevant to this POC  COGNITION: Overall cognitive status: Within functional limits for tasks assessed   SENSATION: Some N/T in hands, none in legs Light touch WFL in BLE  POSTURE: rounded shoulders, forward head, and posterior pelvic tilt  LOWER EXTREMITY ROM:     Active  Right Eval Left Eval  Hip flexion    Hip extension    Hip abduction    Hip adduction    Hip internal rotation    Hip external rotation    Knee flexion    Knee extension Tight HS Tight HS  Ankle dorsiflexion Tight gastroc Tight gastroc  Ankle plantarflexion    Ankle inversion    Ankle eversion     (Blank rows =  not tested)  LOWER EXTREMITY MMT:    MMT Right Eval Left Eval  Hip flexion 4 4  Hip extension    Hip abduction    Hip adduction    Hip internal rotation    Hip external rotation    Knee flexion 4 4  Knee extension 4 4  Ankle dorsiflexion 5 5  Ankle plantarflexion    Ankle inversion    Ankle eversion    (Blank rows = not tested)  BED MOBILITY:  Has a little bit of trouble getting in/out of bed per patient report  TRANSFERS: Sit to stand: Modified independence  Assistive device utilized: None     Stand to sit: Modified independence  Assistive device utilized: None     Chair to chair: Modified independence  Assistive device utilized: None       RAMP:  Not tested  CURB:  Not tested  STAIRS:  STAIRS:  Level of  Assistance: CGA  Stair Negotiation Technique: Alternating Pattern  with Bilateral Rails  Number of Stairs: 6   Height of Stairs: 4  Comments: WFL  GAIT: Findings:  Gait pattern: decreased arm swing- Right, decreased arm swing- Left, decreased hip/knee flexion- Right, decreased hip/knee flexion- Left, decreased ankle dorsiflexion- Right, decreased ankle dorsiflexion- Left, poor foot clearance- Right, and poor foot clearance- Left Distance walked: various clinic distances Assistive device utilized: None Level of assistance: CGA Comments: CGA to due frequent catching of his toes on the floor causing a near fall   FUNCTIONAL TESTS:     VITALS  Vitals:   07/19/24 1321  BP: (!) 148/77  Pulse: 72                                                                                                                                  TREATMENT DATE:    Self-Care/Home Management: Assessed in LUE in sitting prior to intervention. Systolic BP elevated but within limits for session, encouraged compliance with his medication.  TherAct To review previously prescribed HEP: Wide tandem stance in corner 3 x 30 sec L/R Pt demos good ability to perform exercise safety,  did encourage him to have his wife present when he is working on this Discussed this with his wife in the lobby at end of session To work on posterior chain strengthening and balance strategies: Resisted retro gait 4 x 25 ft with purple band Cues to widen BOS and increase step length Resisted retro gait with sudden onset of no resistance 4 x 25 ft Good use of stepping strategy to catch himself Resisted forwards/retro gait with tug from resistance band to indicate direction shift Good ability to switch directions and recover balance RPE 7/10 In // bars with no UE support: Static stance on rockerboard in A/P direction 3 x 30 sec, x 2 sets Improved ability to activate ankle strategy as task progresses, initially does need max cues as he tends to just lean his whole body anteriorly and then has LOB anteriorly   PATIENT EDUCATION: Education details: Review of HEP and that he needs to have someone with him when doing it Person educated: Patient and Spouse Education method: Explanation, Demonstration, Tactile cues, and Verbal cues Education comprehension: verbalized understanding, returned demonstration, verbal cues required, tactile cues required, and needs further education  HOME EXERCISE PROGRAM: Access Code: [email or web address] URL: [email or web address] Date: 07/09/2024 Prepared by: Waddell Southgate  Exercises - Wide Tandem Stance with Eyes Open  - 1 x daily - 7 x weekly - 1 sets - 3-5 reps - 30 sec hold  GOALS: Goals reviewed with patient? Yes  SHORT TERM GOALS: Target date: 07/23/2024   Pt will be independent with initial HEP for improved strength, balance, transfers and gait. Baseline: Goal status: INITIAL  2.  Pt will improve gait velocity to at least 3.25 ft/sec for improved gait efficiency and performance at SBA level  Baseline: 3.05 ft/sec CGA (  8/12) Goal status: INITIAL  3.  Pt will improve FGA to 16/30 for decreased fall risk  Baseline: 12/30 (8/12) Goal status:  INITIAL  4.  Pt will improve MiniBest to 19/28 for decreased fall risk and improvement with compensatory stepping strategies.  Baseline: 17/28 (8/19) Goal status: INITIAL    LONG TERM GOALS: Target date: 08/16/2024   Pt will be independent with final HEP for improved strength, balance, transfers and gait. Baseline:  Goal status: INITIAL  2.  Pt will improve gait velocity to at least 3.5 ft/sec for improved gait efficiency and performance at SBA level  Baseline: 3.05 ft/sec CGA (8/12) Goal status: INITIAL  3.  Pt will improve FGA to 20/30 for decreased fall risk  Baseline: 12/30 (8/12) Goal status: INITIAL  4.  Pt will improve MiniBest to 22/28 for decreased fall risk and improvement with compensatory stepping strategies.  Baseline: 17/28 (8/19) Goal status: INITIAL    ASSESSMENT:  CLINICAL IMPRESSION: Emphasis of skilled PT session on continuing to work on facilitating ankle strategy, stepping strategies, and reviewing HEP. Pt exhibits improved ability to perform his HEP safely this session as compared to previous session but recommend he has Supervision at home when working on it. He exhibits good use of stepping strategy during session and good ability to recover his balance. He also exhibits improved ability to utilize ankle and hip strategies as compared to previous sessions but can benefit from further practice of this. Depending on his cognitive function he may benefit from having his wife present during future sessions. Continue POC.    OBJECTIVE IMPAIRMENTS: Abnormal gait, decreased balance, decreased mobility, decreased ROM, decreased strength, and impaired perceived functional ability.   ACTIVITY LIMITATIONS: carrying, lifting, bending, standing, squatting, stairs, and bed mobility  PARTICIPATION LIMITATIONS: meal prep, cleaning, laundry, driving, shopping, community activity, and yard work  PERSONAL FACTORS: Age, Time since onset of injury/illness/exacerbation, and  3+ comorbidities:   anemia, anxiety, cervical spinal stenosis, depression, HLD, HTN, stroke (2021)are also affecting patient's functional outcome.   REHAB POTENTIAL: Fair time since onset, motivation level  CLINICAL DECISION MAKING: Evolving/moderate complexity  EVALUATION COMPLEXITY: Moderate  PLAN:  PT FREQUENCY: 1-2x/week  PT DURATION: 6 weeks  PLANNED INTERVENTIONS: 97110-Therapeutic exercises, 97530- Therapeutic activity, 97112- Neuromuscular re-education, 97535- Self Care, and [id number]- Manual therapy  PLAN FOR NEXT SESSION: add to HEP to work on functional strengthening and to address gait and balance impairments (tends to catch his feet when walking, head turns, backwards, tandem- pt has poor retention, likely need to have wife present for this), trial LRAD?, Parkinsonian type symptoms per PCP - referred to [facility] and scheduled in November, tends to lose his balance posteriorly, stepping strategy, cog dual tasking, work on facilitation of ankle/hip strategy    Waddell Southgate, PT Waddell Southgate, PT, DPT, CSRS   07/19/2024, 2:03 PM

## 2024-07-23 ENCOUNTER — Ambulatory Visit: Attending: Family Medicine | Admitting: Physical Therapy

## 2024-07-23 VITALS — BP 138/63 | HR 72

## 2024-07-23 DIAGNOSIS — M6281 Muscle weakness (generalized): Secondary | ICD-10-CM | POA: Diagnosis not present

## 2024-07-23 DIAGNOSIS — R2689 Other abnormalities of gait and mobility: Secondary | ICD-10-CM | POA: Insufficient documentation

## 2024-07-23 DIAGNOSIS — R2681 Unsteadiness on feet: Secondary | ICD-10-CM | POA: Insufficient documentation

## 2024-07-23 NOTE — Therapy (Signed)
 OUTPATIENT PHYSICAL THERAPY NEURO TREATMENT   Patient Name: Devin Valdez MRN: [id number] DOB:1941-06-11, 83 y.o., male Today's Date: 07/23/2024   PCP: [name], MD REFERRING PROVIDER: [name], MD  END OF SESSION:  PT End of Session - 07/23/24 1236     Visit Number 5    Number of Visits 13   with eval   Date for PT Re-Evaluation 08/27/24    Authorization Type Medicare    PT Start Time 1235    PT Stop Time 1315    PT Time Calculation (min) 40 min    Equipment Utilized During Treatment Gait belt    Activity Tolerance Patient tolerated treatment well    Behavior During Therapy WFL for tasks assessed/performed              Past Medical History:  Diagnosis Date   Anemia    Anxiety    Cervical stenosis of spinal canal    mri 8/19- severe   Depression    Falls    Hallucinations    Hyperlipidemia    Hypertension    Stroke (HCC)    left paracentral pons infarct   Past Surgical History:  Procedure Laterality Date   Carotid Dopplers  12/2019   CVA evaluation: Bilateral ICA <40%.  Bilateral vertebral arteries with normal flow.  Bilateral subclavian arteries are normal flow.   EYE SURGERY     HERNIA REPAIR     POSTERIOR CERVICAL LAMINECTOMY     with fusion C3-C7 10/19- Dr. Bufford at [facility] ECHOCARDIOGRAM  11/2019   (CVA evaluation) EF 55 to 60%.  GR 1 DD.  No RWMA.  Normal atrial sizes.  Mild aortic valve sclerosis otherwise normal valves.   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 eye  06/17/2020   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 04/08/2020   Vitreomacular adhesion of left eye 04/08/2020   Cystoid macular edema of left eye 04/08/2020   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HCC) 01/13/2020   Acute right-sided weakness 01/13/2020   Difficulty urinating 01/13/2020   Stroke (HCC)    CVA (cerebral vascular accident) (HCC) 12/20/2019   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HCC)    PVC (premature ventricular contraction)    Cervical stenosis of spinal canal    Synovitis of hand 09/12/2018   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/25/2018   Failure to thrive in adult 05/26/2017   Failure to thrive in pediatric patient 05/25/2017   Abdominal pulsatile mass 05/25/2017   Anemia 05/25/2017   Essential hypertension 05/25/2017   Gait instability 05/20/2017   Frequent falls 05/20/2017   Depression 05/20/2017    ONSET DATE: 06/14/2024 (referral date)  REFERRING DIAG: M62.81 (ICD-10-CM) - Muscle weakness R53.83 (ICD-10-CM) - Fatigue, unspecified type R26.81 (ICD-10-CM) - Gait instability R29.6 (ICD-10-CM) - Frequent falls  THERAPY DIAG:  Muscle weakness (generalized)  Other abnormalities of gait and mobility  Unsteadiness on feet  Rationale for Evaluation and Treatment: Rehabilitation  SUBJECTIVE:  SUBJECTIVE STATEMENT: [name]  Pt presents alone, wife stayed in lobby. Pt denies acute changes or falls. States his HEP is still difficult.    Pt accompanied by: significant other [name] (in lobby)  PERTINENT HISTORY: PMH: anemia, anxiety, cervical spinal stenosis, depression, HLD, HTN, stroke (2021)  Per PCP note 06/07/2024: Patient has a slow shuffling gait.  He has had this ever since his stroke although it seems slightly worse today.  This makes me concerned about possible  parkinsonism.  Patient had the same presentation in December.  He minimizes symptoms and wife is obviously concerned.  She states that he just sits and watches TV all day.  He denies depression or anhedonia.  He denies memory loss and she agrees.  There is no tremor but there is some bradykinesia and postural instability.  I will check a CK level to evaluate for statin this myopathy along with a sedimentation rate.  Rarely with statin.  If labs are normal, I would recommend a neurology consultation for parkinsonian features as well as physical therapy for muscle weakness which I believe is primarily due to deconditioning.   PAIN:  Are you having pain? No  PRECAUTIONS: Fall  RED FLAGS: None   WEIGHT BEARING RESTRICTIONS: No  FALLS: Has patient fallen in last 6 months? No  LIVING ENVIRONMENT: Lives with: lives with their spouse Lives in: House/apartment Stairs: Yes: Internal: 7-10 steps; on right going up and External: 5-6 steps; on right going up Has following equipment at home: Single point cane and Walker - 2 wheeled - doesn't use  PLOF: Independent with gait and Independent with transfers  PATIENT GOALS: get me back to how I was  OBJECTIVE:  Note: Objective measures were completed at Evaluation unless otherwise noted.  DIAGNOSTIC FINDINGS: None updated or relevant to this POC  COGNITION: Overall cognitive status: Within functional limits for tasks assessed   SENSATION: Some N/T in hands, none in legs Light touch WFL in BLE  POSTURE: rounded shoulders, forward head, and posterior pelvic tilt  LOWER EXTREMITY ROM:     Active  Right Eval Left Eval  Hip flexion    Hip extension    Hip abduction    Hip adduction    Hip internal rotation    Hip external rotation    Knee flexion    Knee extension Tight HS Tight HS  Ankle dorsiflexion Tight gastroc Tight gastroc  Ankle plantarflexion    Ankle inversion    Ankle eversion     (Blank rows = not tested)  LOWER  EXTREMITY MMT:    MMT Right Eval Left Eval  Hip flexion 4 4  Hip extension    Hip abduction    Hip adduction    Hip internal rotation    Hip external rotation    Knee flexion 4 4  Knee extension 4 4  Ankle dorsiflexion 5 5  Ankle plantarflexion    Ankle inversion    Ankle eversion    (Blank rows = not tested)  BED MOBILITY:  Has a little bit of trouble getting in/out of bed per patient report  TRANSFERS: Sit to stand: Modified independence  Assistive device utilized: None     Stand to sit: Modified independence  Assistive device utilized: None     Chair to chair: Modified independence  Assistive device utilized: None       RAMP:  Not tested  CURB:  Not tested  STAIRS:  STAIRS:  Level of Assistance: CGA  Stair Negotiation Technique:  Alternating Pattern  with Bilateral Rails  Number of Stairs: 6   Height of Stairs: 4  Comments: WFL  GAIT: Findings:  Gait pattern: decreased arm swing- Right, decreased arm swing- Left, decreased hip/knee flexion- Right, decreased hip/knee flexion- Left, decreased ankle dorsiflexion- Right, decreased ankle dorsiflexion- Left, poor foot clearance- Right, and poor foot clearance- Left Distance walked: various clinic distances Assistive device utilized: None Level of assistance: CGA Comments: CGA to due frequent catching of his toes on the floor causing a near fall   FUNCTIONAL TESTS:    [facility] PT Assessment - 07/23/24 1242       Ambulation/Gait   Gait velocity 32.8' over 14.16s = 2.32 ft/s no AD   SBA     Mini-BESTest   Sit To Stand Normal: Comes to stand without use of hands and stabilizes independently.    Rise to Toes Moderate: Heels up, but not full range (smaller than when holding hands), OR noticeable instability for 3 s.    Stand on one leg (left) Moderate: < 20 s   1.5s   Stand on one leg (right) Moderate: < 20 s   3.5s   Stand on one leg - lowest score 1    Compensatory Stepping Correction - Forward No step, OR would  fall if not caught, OR falls spontaneously.    Compensatory Stepping Correction - Backward No step, OR would fall if not caught, OR falls spontaneously.    Stance - Feet together, eyes open, firm surface  Normal: 30s    Stance - Feet together, eyes closed, foam surface  Normal: 30s    Incline - Eyes Closed Moderate: Stands independently < 30s OR aligns with surface   aligns w/surface   Change in Gait Speed Moderate: Unable to change walking speed or signs of imbalance    Walk with head turns - Horizontal Moderate: performs head turns with reduction in gait speed.    Walk with pivot turns Moderate:Turns with feet close SLOW (>4 steps) with good balance.    Step over obstacles Moderate: Steps over box but touches box OR displays cautious behavior by slowing gait.    Timed UP & GO with Dual Task Severe: Stops counting while walking OR stops walking while counting.      Timed Up and Go Test   Normal TUG (seconds) 14.1    Cognitive TUG (seconds) 36.62   Cued to retro count by 3s starting at 72, pt repeating 72 and 73 but unable to count. Pt ambulated past line, requiring cues to turn around and return to chair   TUG Comments No AD, high fall risk      Functional Gait  Assessment   Gait assessed  Yes    Gait Level Surface Walks 20 ft in less than 7 sec but greater than 5.5 sec, uses assistive device, slower speed, mild gait deviations, or deviates 6-10 in outside of the 12 in walkway width.   6.97s   Change in Gait Speed Makes only minor adjustments to walking speed, or accomplishes a change in speed with significant gait deviations, deviates 10-15 in outside the 12 in walkway width, or changes speed but loses balance but is able to recover and continue walking.    Gait with Horizontal Head Turns Performs head turns smoothly with slight change in gait velocity (eg, minor disruption to smooth gait path), deviates 6-10 in outside 12 in walkway width, or uses an assistive device.    Gait with Vertical  Head Turns Performs  task with slight change in gait velocity (eg, minor disruption to smooth gait path), deviates 6 - 10 in outside 12 in walkway width or uses assistive device    Gait and Pivot Turn Turns slowly, requires verbal cueing, or requires several small steps to catch balance following turn and stop    Step Over Obstacle Is able to step over one shoe box (4.5 in total height) but must slow down and adjust steps to clear box safely. May require verbal cueing.    Gait with Narrow Base of Support Is able to ambulate for 10 steps heel to toe with no staggering.    Gait with Eyes Closed Walks 20 ft, slow speed, abnormal gait pattern, evidence for imbalance, deviates 10-15 in outside 12 in walkway width. Requires more than 9 sec to ambulate 20 ft.   9.16s   Ambulating Backwards Walks 20 ft, slow speed, abnormal gait pattern, evidence for imbalance, deviates 10-15 in outside 12 in walkway width.   20.75s   Steps Alternating feet, must use rail.    Total Score 16    FGA comment: 16/30, high fall risk          VITALS  Vitals:   07/23/24 1238  BP: 138/63  Pulse: 72                                                                                                                                   TREATMENT DATE:    Self-Care/Home Management: Assessed in LUE in sitting prior to intervention (see above) and WNL this date. Pt provided several cups of water throughout session.   [employer]Act - STG Assessment    [facility] PT Assessment - 07/23/24 1242       Ambulation/Gait   Gait velocity 32.8' over 14.16s = 2.32 ft/s no AD   SBA     Mini-BESTest   Sit To Stand Normal: Comes to stand without use of hands and stabilizes independently.    Rise to Toes Moderate: Heels up, but not full range (smaller than when holding hands), OR noticeable instability for 3 s.    Stand on one leg (left) Moderate: < 20 s   1.5s   Stand on one leg (right) Moderate: < 20 s   3.5s   Stand on one leg - lowest score 1     Compensatory Stepping Correction - Forward No step, OR would fall if not caught, OR falls spontaneously.    Compensatory Stepping Correction - Backward No step, OR would fall if not caught, OR falls spontaneously.    Stance - Feet together, eyes open, firm surface  Normal: 30s    Stance - Feet together, eyes closed, foam surface  Normal: 30s    Incline - Eyes Closed Moderate: Stands independently < 30s OR aligns with surface   aligns w/surface   Change in Gait Speed Moderate: Unable to change walking speed or signs of imbalance  Walk with head turns - Horizontal Moderate: performs head turns with reduction in gait speed.    Walk with pivot turns Moderate:Turns with feet close SLOW (>4 steps) with good balance.    Step over obstacles Moderate: Steps over box but touches box OR displays cautious behavior by slowing gait.    Timed UP & GO with Dual Task Severe: Stops counting while walking OR stops walking while counting.      Timed Up and Go Test   Normal TUG (seconds) 14.1    Cognitive TUG (seconds) 36.62   Cued to retro count by 3s starting at 72, pt repeating 72 and 73 but unable to count. Pt ambulated past line, requiring cues to turn around and return to chair   TUG Comments No AD, high fall risk      Functional Gait  Assessment   Gait assessed  Yes    Gait Level Surface Walks 20 ft in less than 7 sec but greater than 5.5 sec, uses assistive device, slower speed, mild gait deviations, or deviates 6-10 in outside of the 12 in walkway width.   6.97s   Change in Gait Speed Makes only minor adjustments to walking speed, or accomplishes a change in speed with significant gait deviations, deviates 10-15 in outside the 12 in walkway width, or changes speed but loses balance but is able to recover and continue walking.    Gait with Horizontal Head Turns Performs head turns smoothly with slight change in gait velocity (eg, minor disruption to smooth gait path), deviates 6-10 in outside 12 in  walkway width, or uses an assistive device.    Gait with Vertical Head Turns Performs task with slight change in gait velocity (eg, minor disruption to smooth gait path), deviates 6 - 10 in outside 12 in walkway width or uses assistive device    Gait and Pivot Turn Turns slowly, requires verbal cueing, or requires several small steps to catch balance following turn and stop    Step Over Obstacle Is able to step over one shoe box (4.5 in total height) but must slow down and adjust steps to clear box safely. May require verbal cueing.    Gait with Narrow Base of Support Is able to ambulate for 10 steps heel to toe with no staggering.    Gait with Eyes Closed Walks 20 ft, slow speed, abnormal gait pattern, evidence for imbalance, deviates 10-15 in outside 12 in walkway width. Requires more than 9 sec to ambulate 20 ft.   9.16s   Ambulating Backwards Walks 20 ft, slow speed, abnormal gait pattern, evidence for imbalance, deviates 10-15 in outside 12 in walkway width.   20.75s   Steps Alternating feet, must use rail.    Total Score 16    FGA comment: 16/30, high fall risk          Discussed results of outcome measures thus far and noted some improvement in balance but also some decline in gait speed and stability. Pt reports he feels more stable overall, but cannot tell how I am doing.   PATIENT EDUCATION: Education details: Goal results so far, next appointment date and time  Person educated: Patient Education method: Medical illustrator Education comprehension: verbalized understanding, verbal cues required, and needs further education  HOME EXERCISE PROGRAM: Access Code: [email or web address] URL: [email or web address] Date: 07/09/2024 Prepared by: Waddell Southgate  Exercises - Wide Tandem Stance with Eyes Open  - 1 x daily - 7 x weekly - 1 sets - 3-5 reps -  30 sec hold  GOALS: Goals reviewed with patient? Yes  SHORT TERM GOALS: Target date: 07/23/2024   Pt will be  independent with initial HEP for improved strength, balance, transfers and gait. Baseline: Goal status: MET  2.  Pt will improve gait velocity to at least 3.25 ft/sec for improved gait efficiency and performance at SBA level  Baseline: 3.05 ft/sec CGA (8/12); 2.32 ft/s no AD (9/2) Goal status: NOT MET   3.  Pt will improve FGA to 16/30 for decreased fall risk  Baseline: 12/30 (8/12); 16/30 (9/2) Goal status: MET  4.  Pt will improve MiniBest to 19/28 for decreased fall risk and improvement with compensatory stepping strategies.  Baseline: 17/28 (8/19) Goal status: INITIAL    LONG TERM GOALS: Target date: 08/16/2024   Pt will be independent with final HEP for improved strength, balance, transfers and gait. Baseline:  Goal status: INITIAL  2.  Pt will improve gait velocity to at least 3.1 ft/sec for improved gait efficiency and performance at SBA level  Baseline: 3.05 ft/sec CGA (8/12); 2.32 ft/s no AD (9/2) Goal status: REVISED   3.  Pt will improve FGA to 20/30 for decreased fall risk  Baseline: 12/30 (8/12); 16/30 (9/2) Goal status: INITIAL  4.  Pt will improve MiniBest to 22/28 for decreased fall risk and improvement with compensatory stepping strategies.  Baseline: 17/28 (8/19) Goal status: INITIAL    ASSESSMENT:  CLINICAL IMPRESSION: Emphasis of skilled PT session on STG assessment. Pt has met 2 of 4 STGs thus far, reporting independence w/HEP and improving his score on FGA to 16/30. Pt demonstrated slower gait speed today compared to eval and is inconsistent w/scoring for MiniBest and FGA compared to eval, showing improvements in some tasks and decline in others. Pt very challenged by catch and release task of MiniBest, requiring second therapist to be present to facilitate proper lean from pt. Unable to complete MiniBest today due to difficulty facilitating stepping strategies. Pt overall remains a high fall risk and is limited by fluctuating cognition and impaired  safety awareness. Pt will continue to benefit from skilled PT to work on balance strategies for reduced fall risk. Continue POC.    OBJECTIVE IMPAIRMENTS: Abnormal gait, decreased balance, decreased mobility, decreased ROM, decreased strength, and impaired perceived functional ability.   ACTIVITY LIMITATIONS: carrying, lifting, bending, standing, squatting, stairs, and bed mobility  PARTICIPATION LIMITATIONS: meal prep, cleaning, laundry, driving, shopping, community activity, and yard work  PERSONAL FACTORS: Age, Time since onset of injury/illness/exacerbation, and 3+ comorbidities:   anemia, anxiety, cervical spinal stenosis, depression, HLD, HTN, stroke (2021)are also affecting patient's functional outcome.   REHAB POTENTIAL: Fair time since onset, motivation level  CLINICAL DECISION MAKING: Evolving/moderate complexity  EVALUATION COMPLEXITY: Moderate  PLAN:  PT FREQUENCY: 1-2x/week  PT DURATION: 6 weeks  PLANNED INTERVENTIONS: 97110-Therapeutic exercises, 97530- Therapeutic activity, 97112- Neuromuscular re-education, 97535- Self Care, and [id number]- Manual therapy  PLAN FOR NEXT SESSION: Finish MiniBest. add to HEP to work on functional strengthening and to address gait and balance impairments (tends to catch his feet when walking, head turns, backwards, tandem- pt has poor retention, likely need to have wife present for this), trial LRAD?, Parkinsonian type symptoms per PCP - referred to [facility] and scheduled in November, tends to lose his balance posteriorly, stepping strategy, cog dual tasking, work on facilitation of ankle/hip strategy    Prayan Ulin E Ellanie Oppedisano, PT, DPT  07/23/2024, 2:17 PM

## 2024-07-26 ENCOUNTER — Ambulatory Visit: Admitting: Physical Therapy

## 2024-07-26 VITALS — BP 132/66 | HR 71

## 2024-07-26 DIAGNOSIS — R2681 Unsteadiness on feet: Secondary | ICD-10-CM

## 2024-07-26 DIAGNOSIS — R2689 Other abnormalities of gait and mobility: Secondary | ICD-10-CM

## 2024-07-26 DIAGNOSIS — M6281 Muscle weakness (generalized): Secondary | ICD-10-CM | POA: Diagnosis not present

## 2024-07-26 NOTE — Therapy (Signed)
 OUTPATIENT PHYSICAL THERAPY NEURO TREATMENT   Patient Name: Devin Valdez MRN: [id number] DOB:[birth year], 83 y.o., male Today's Date: 07/26/2024   PCP: [name], MD REFERRING PROVIDER: [name], MD  END OF SESSION:  PT End of Session - 07/26/24 1319     Visit Number 6    Number of Visits 13   with eval   Date for PT Re-Evaluation 08/27/24    Authorization Type Medicare    PT Start Time 1318    PT Stop Time 1358    PT Time Calculation (min) 40 min    Equipment Utilized During Treatment Gait belt    Activity Tolerance Patient tolerated treatment well    Behavior During Therapy WFL for tasks assessed/performed               Past Medical History:  Diagnosis Date   Anemia    Anxiety    Cervical stenosis of spinal canal    mri 8/19- severe   Depression    Falls    Hallucinations    Hyperlipidemia    Hypertension    Stroke (HCC)    left paracentral pons infarct   Past Surgical History:  Procedure Laterality Date   Carotid Dopplers  12/2019   CVA evaluation: Bilateral ICA <40%.  Bilateral vertebral arteries with normal flow.  Bilateral subclavian arteries are normal flow.   EYE SURGERY     HERNIA REPAIR     POSTERIOR CERVICAL LAMINECTOMY     with fusion C3-C7 10/19- Dr. Bufford at [facility] ECHOCARDIOGRAM  11/2019   (CVA evaluation) EF 55 to 60%.  GR 1 DD.  No RWMA.  Normal atrial sizes.  Mild aortic valve sclerosis otherwise normal valves.   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 eye  06/17/2020   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 04/08/2020   Vitreomacular adhesion of left eye 04/08/2020   Cystoid macular edema of left eye 04/08/2020   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HCC) 01/13/2020   Acute right-sided weakness 01/13/2020   Difficulty urinating 01/13/2020   Stroke (HCC)    CVA (cerebral vascular accident) (HCC) 12/20/2019   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HCC)    PVC (premature ventricular contraction)    Cervical stenosis of spinal canal    Synovitis of hand 09/12/2018   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/25/2018   Failure to thrive in adult 05/26/2017   Failure to thrive in pediatric patient 05/25/2017   Abdominal pulsatile mass 05/25/2017   Anemia 05/25/2017   Essential hypertension 05/25/2017   Gait instability 05/20/2017   Frequent falls 05/20/2017   Depression 05/20/2017    ONSET DATE: 06/14/2024 (referral date)  REFERRING DIAG: M62.81 (ICD-10-CM) - Muscle weakness R53.83 (ICD-10-CM) - Fatigue, unspecified type R26.81 (ICD-10-CM) - Gait instability R29.6 (ICD-10-CM) - Frequent falls  THERAPY DIAG:  Muscle weakness (generalized)  Other abnormalities of gait and mobility  Unsteadiness on feet  Rationale for Evaluation and Treatment: Rehabilitation  SUBJECTIVE:  SUBJECTIVE STATEMENT: [name]  Pt presents alone, wife stayed in lobby. Pt denies acute changes or falls. States his HEP is still challenging but going slowly.    Pt accompanied by: significant other [name] (in lobby)  PERTINENT HISTORY: PMH: anemia, anxiety, cervical spinal stenosis, depression, HLD, HTN, stroke (2021)  Per PCP note 06/07/2024: Patient has a slow shuffling gait.  He has had this ever since his stroke although it seems slightly worse today.  This makes me concerned  about possible parkinsonism.  Patient had the same presentation in December.  He minimizes symptoms and wife is obviously concerned.  She states that he just sits and watches TV all day.  He denies depression or anhedonia.  He denies memory loss and she agrees.  There is no tremor but there is some bradykinesia and postural instability.  I will check a CK level to evaluate for statin this myopathy along with a sedimentation rate.  Rarely with statin.  If labs are normal, I would recommend a neurology consultation for parkinsonian features as well as physical therapy for muscle weakness which I believe is primarily due to deconditioning.   PAIN:  Are you having pain? No  PRECAUTIONS: Fall  RED FLAGS: None   WEIGHT BEARING RESTRICTIONS: No  FALLS: Has patient fallen in last 6 months? No  LIVING ENVIRONMENT: Lives with: lives with their spouse Lives in: House/apartment Stairs: Yes: Internal: 7-10 steps; on right going up and External: 5-6 steps; on right going up Has following equipment at home: Single point cane and Walker - 2 wheeled - doesn't use  PLOF: Independent with gait and Independent with transfers  PATIENT GOALS: get me back to how I was  OBJECTIVE:  Note: Objective measures were completed at Evaluation unless otherwise noted.  DIAGNOSTIC FINDINGS: None updated or relevant to this POC  COGNITION: Overall cognitive status: Within functional limits for tasks assessed   SENSATION: Some N/T in hands, none in legs Light touch WFL in BLE  POSTURE: rounded shoulders, forward head, and posterior pelvic tilt  LOWER EXTREMITY ROM:     Active  Right Eval Left Eval  Hip flexion    Hip extension    Hip abduction    Hip adduction    Hip internal rotation    Hip external rotation    Knee flexion    Knee extension Tight HS Tight HS  Ankle dorsiflexion Tight gastroc Tight gastroc  Ankle plantarflexion    Ankle inversion    Ankle eversion     (Blank rows = not  tested)  LOWER EXTREMITY MMT:    MMT Right Eval Left Eval  Hip flexion 4 4  Hip extension    Hip abduction    Hip adduction    Hip internal rotation    Hip external rotation    Knee flexion 4 4  Knee extension 4 4  Ankle dorsiflexion 5 5  Ankle plantarflexion    Ankle inversion    Ankle eversion    (Blank rows = not tested)  BED MOBILITY:  Has a little bit of trouble getting in/out of bed per patient report  TRANSFERS: Sit to stand: Modified independence  Assistive device utilized: None     Stand to sit: Modified independence  Assistive device utilized: None     Chair to chair: Modified independence  Assistive device utilized: None       RAMP:  Not tested  CURB:  Not tested  STAIRS:  STAIRS:  Level of Assistance: CGA  Stair Negotiation Technique: Alternating Pattern  with Bilateral Rails  Number of Stairs: 6   Height of Stairs: 4  Comments: WFL  GAIT: Findings:  Gait pattern: decreased arm swing- Right, decreased arm swing- Left, decreased hip/knee flexion- Right, decreased hip/knee flexion- Left, decreased ankle dorsiflexion- Right, decreased ankle dorsiflexion- Left, poor foot clearance- Right, and poor foot clearance- Left Distance walked: various clinic distances Assistive device utilized: None Level of assistance: CGA Comments: CGA to due frequent catching of his toes on the floor causing a near fall   FUNCTIONAL TESTS:    [facility] PT Assessment - 07/26/24 1323       Ambulation/Gait   Gait velocity 32.8' over 14.16s = 2.32 ft/s no AD   SBA     Mini-BESTest   Sit To Stand Normal: Comes to stand without use of hands and stabilizes independently.    Rise to Toes Moderate: Heels up, but not full range (smaller than when holding hands), OR noticeable instability for 3 s.    Stand on one leg (left) Moderate: < 20 s   1.5s   Stand on one leg (right) Moderate: < 20 s   3.5s   Stand on one leg - lowest score 1    Compensatory Stepping Correction - Forward No  step, OR would fall if not caught, OR falls spontaneously.    Compensatory Stepping Correction - Backward No step, OR would fall if not caught, OR falls spontaneously.    Compensatory Stepping Correction - Left Lateral Severe: Falls, or cannot step    Compensatory Stepping Correction - Right Lateral Severe:  Falls, or cannot step    Stepping Corredtion Lateral - lowest score 0    Stance - Feet together, eyes open, firm surface  Normal: 30s    Stance - Feet together, eyes closed, foam surface  Normal: 30s    Incline - Eyes Closed Moderate: Stands independently < 30s OR aligns with surface   aligns w/surface   Change in Gait Speed Moderate: Unable to change walking speed or signs of imbalance    Walk with head turns - Horizontal Moderate: performs head turns with reduction in gait speed.    Walk with pivot turns Moderate:Turns with feet close SLOW (>4 steps) with good balance.    Step over obstacles Moderate: Steps over box but touches box OR displays cautious behavior by slowing gait.    Timed UP & GO with Dual Task Severe: Stops counting while walking OR stops walking while counting.    Mini-BEST total score 13      Timed Up and Go Test   Normal TUG (seconds) 14.1    Cognitive TUG (seconds) 36.62   Cued to retro count by 3s starting at 72, pt repeating 72 and 73 but unable to count. Pt ambulated past line, requiring cues to turn around and return to chair   TUG Comments No AD, high fall risk      Functional Gait  Assessment   Gait assessed  Yes    Gait Level Surface Walks 20 ft in less than 7 sec but greater than 5.5 sec, uses assistive device, slower speed, mild gait deviations, or deviates 6-10 in outside of the 12 in walkway width.   6.97s   Change in Gait Speed Makes only minor adjustments to walking speed, or accomplishes a change in speed with significant gait deviations, deviates 10-15 in outside the 12 in walkway width, or changes speed but loses balance but is able to recover and  continue walking.    Gait with Horizontal Head Turns Performs head turns smoothly with slight change in gait velocity (eg, minor disruption to smooth gait path), deviates 6-10 in outside 12 in walkway width, or uses an assistive device.    Gait with Vertical Head Turns Performs task with slight change in gait velocity (eg, minor disruption to smooth gait path), deviates 6 - 10 in outside 12 in walkway width or uses assistive device    Gait and Pivot Turn Turns slowly, requires verbal cueing, or requires several small steps to catch balance following turn and stop    Step Over Obstacle Is able to step over one shoe box (4.5 in total height) but must slow down and adjust steps to clear box safely. May require verbal cueing.    Gait with Narrow Base of Support Is able to ambulate for 10 steps heel to toe with no staggering.    Gait with Eyes Closed Walks 20 ft, slow speed, abnormal gait pattern, evidence for imbalance, deviates 10-15 in outside 12 in walkway width. Requires more than 9 sec to ambulate 20 ft.   9.16s   Ambulating Backwards Walks 20 ft, slow speed, abnormal gait pattern, evidence for imbalance, deviates 10-15 in outside 12 in walkway width.   20.75s   Steps Alternating feet, must use rail.    Total Score 16    FGA comment: 16/30, high fall risk           VITALS  Vitals:   07/26/24 1321  BP: 132/66  Pulse: 71                                                                                                                                    TREATMENT DATE:    Self-Care/Home Management: Assessed in LUE in sitting prior to intervention (see above) and WNL this date.  Ther Act  SciFit multi-peaks level 9.0 for 8 minutes using BUE/BLEs for neural priming for reciprocal movement, dynamic cardiovascular warmup and increased amplitude of stepping. RPE of 6-7/10 following activity     [facility] PT Assessment - 07/26/24 1323     Mini-BESTest   Sit To Stand Normal: Comes to stand  without use of hands and stabilizes independently.    Rise to Toes Moderate: Heels up, but not full range (smaller than when holding hands), OR noticeable instability for 3 s.    Stand on one leg (left) Moderate: < 20 s   1.5s   Stand on one leg (right) Moderate: < 20 s   3.5s   Stand on one leg - lowest score 1    Compensatory Stepping Correction - Forward No step, OR would fall if not caught, OR falls spontaneously.    Compensatory Stepping Correction - Backward No step, OR would fall if not caught, OR falls spontaneously.    Compensatory Stepping Correction - Left Lateral Severe: Falls, or cannot step  Compensatory Stepping Correction - Right Lateral Severe:  Falls, or cannot step    Stepping Corredtion Lateral - lowest score 0    Stance - Feet together, eyes open, firm surface  Normal: 30s    Stance - Feet together, eyes closed, foam surface  Normal: 30s    Incline - Eyes Closed Moderate: Stands independently < 30s OR aligns with surface   aligns w/surface   Change in Gait Speed Moderate: Unable to change walking speed or signs of imbalance    Walk with head turns - Horizontal Moderate: performs head turns with reduction in gait speed.    Walk with pivot turns Moderate:Turns with feet close SLOW (>4 steps) with good balance.    Step over obstacles Moderate: Steps over box but touches box OR displays cautious behavior by slowing gait.    Timed UP & GO with Dual Task Severe: Stops counting while walking OR stops walking while counting.    Mini-BEST total score 13         NMR  From standard mat table, sit to stands w/3kg ball throw x5 reps for improved functional BLE strength, reactive balance strategies and high amplitude movement. Min cues to avoid bracing legs on back of mat to stabilize during transfer. Pt required SBA, so progressed to x6 reps while pt standing on airex for added stability challenge w/CGA. Pt able to stabilize well w/no overt LOB noted, but took extra time to  stabilize in standing prior to being able to throw ball  In // bars, 6 Blaze pods on random reach setting for improved LE coordination, single leg stability and step clearance.  Performed on 2 minute intervals with 3-5 minute seated rest periods.  Pt requires SBA guarding. Round 1:  lateral pods setup w/cues to side step to tap pods. BUE support used, 37 hits. Round 2:  added two 4 hurdles w/same setup and pt performed w/no UE support.  20 hits. Notable errors/deficits:  RPE of 7/10 following activity. Min cues to maintain side stepping rather than step fwd.     PATIENT EDUCATION: Education details: Results of MiniBest, continue HEP  Person educated: Patient Education method: Explanation and Demonstration Education comprehension: verbalized understanding, verbal cues required, and needs further education  HOME EXERCISE PROGRAM: Access Code: [email or web address] URL: [email or web address] Date: 07/09/2024 Prepared by: Waddell Southgate  Exercises - Wide Tandem Stance with Eyes Open  - 1 x daily - 7 x weekly - 1 sets - 3-5 reps - 30 sec hold  GOALS: Goals reviewed with patient? Yes  SHORT TERM GOALS: Target date: 07/23/2024   Pt will be independent with initial HEP for improved strength, balance, transfers and gait. Baseline: Goal status: MET  2.  Pt will improve gait velocity to at least 3.25 ft/sec for improved gait efficiency and performance at SBA level  Baseline: 3.05 ft/sec CGA (8/12); 2.32 ft/s no AD (9/2) Goal status: NOT MET   3.  Pt will improve FGA to 16/30 for decreased fall risk  Baseline: 12/30 (8/12); 16/30 (9/2) Goal status: MET  4.  Pt will improve MiniBest to 19/28 for decreased fall risk and improvement with compensatory stepping strategies.  Baseline: 17/28 (8/19); 13/28 (9/5) Goal status: NOT MET     LONG TERM GOALS: Target date: 08/16/2024   Pt will be independent with final HEP for improved strength, balance, transfers and gait. Baseline:  Goal  status: INITIAL  2.  Pt will improve gait velocity to at least 3.1 ft/sec for improved gait efficiency and  performance at SBA level  Baseline: 3.05 ft/sec CGA (8/12); 2.32 ft/s no AD (9/2) Goal status: REVISED   3.  Pt will improve FGA to 20/30 for decreased fall risk  Baseline: 12/30 (8/12); 16/30 (9/2) Goal status: INITIAL  4.  Pt will improve MiniBest to 19/28 for decreased fall risk and improvement with compensatory stepping strategies.  Baseline: 17/28 (8/19); 13/28 (9/5) Goal status: REVISED     ASSESSMENT:  CLINICAL IMPRESSION: Emphasis of skilled PT session on completing STG assessment, functional BLE strength and increased step clearance. Pt declined on his score of MiniBest, scoring a 13/28 today compared to 17/28 on eval. Pt most challenged by stepping strategies, single leg stability and cog dual-tasking. Pt's speed has overall slowed down, but pt reports he does not notice a difference. Pt did well w/use of blaze pods, increased his stepping speed and demonstrating large steps w/use of hurdles. Pt very fatigued following activity but will benefit from skilled interventions that facilitate improved velocity of movement. Continue POC.   OBJECTIVE IMPAIRMENTS: Abnormal gait, decreased balance, decreased mobility, decreased ROM, decreased strength, and impaired perceived functional ability.   ACTIVITY LIMITATIONS: carrying, lifting, bending, standing, squatting, stairs, and bed mobility  PARTICIPATION LIMITATIONS: meal prep, cleaning, laundry, driving, shopping, community activity, and yard work  PERSONAL FACTORS: Age, Time since onset of injury/illness/exacerbation, and 3+ comorbidities:   anemia, anxiety, cervical spinal stenosis, depression, HLD, HTN, stroke (2021)are also affecting patient's functional outcome.   REHAB POTENTIAL: Fair time since onset, motivation level  CLINICAL DECISION MAKING: Evolving/moderate complexity  EVALUATION COMPLEXITY:  Moderate  PLAN:  PT FREQUENCY: 1-2x/week  PT DURATION: 6 weeks  PLANNED INTERVENTIONS: 97110-Therapeutic exercises, 97530- Therapeutic activity, 97112- Neuromuscular re-education, 97535- Self Care, and [id number]- Manual therapy  PLAN FOR NEXT SESSION:  add to HEP to work on functional strengthening and to address gait and balance impairments (tends to catch his feet when walking, head turns, backwards, tandem- pt has poor retention, likely need to have wife present for this), trial LRAD?, Parkinsonian type symptoms per PCP - referred to [facility] and scheduled in November, tends to lose his balance posteriorly, stepping strategy, cog dual tasking, work on facilitation of ankle/hip strategy, blaze pods    Wynne Rozak E Antony Sian, PT, DPT  07/26/2024, 2:01 PM

## 2024-07-30 ENCOUNTER — Ambulatory Visit: Admitting: Physical Therapy

## 2024-07-30 VITALS — BP 134/68 | HR 71 | Temp 98.3°F | Ht 64.0 in | Wt 219.0 lb

## 2024-07-30 DIAGNOSIS — R2689 Other abnormalities of gait and mobility: Secondary | ICD-10-CM

## 2024-07-30 DIAGNOSIS — M6281 Muscle weakness (generalized): Secondary | ICD-10-CM | POA: Diagnosis not present

## 2024-07-30 DIAGNOSIS — R2681 Unsteadiness on feet: Secondary | ICD-10-CM | POA: Diagnosis not present

## 2024-07-30 NOTE — Therapy (Signed)
 OUTPATIENT PHYSICAL THERAPY NEURO TREATMENT   Patient Name: Devin Valdez MRN: [id number] DOB:09/10/1941, 83 y.o., male Today's Date: 07/30/2024   PCP: Devin Butler DASEN, MD REFERRING PROVIDER: [name], MD  END OF SESSION:  PT End of Session - 07/30/24 1218     Visit Number 7    Number of Visits 13   with eval   Date for PT Re-Evaluation 08/27/24    Authorization Type Medicare    PT Start Time 1217    PT Stop Time 1300    PT Time Calculation (min) 43 min    Equipment Utilized During Treatment Gait belt    Activity Tolerance Patient tolerated treatment well    Behavior During Therapy WFL for tasks assessed/performed                Past Medical History:  Diagnosis Date   Anemia    Anxiety    Cervical stenosis of spinal canal    mri 8/19- severe   Depression    Falls    Hallucinations    Hyperlipidemia    Hypertension    Stroke (HCC)    left paracentral pons infarct   Past Surgical History:  Procedure Laterality Date   Carotid Dopplers  12/2019   CVA evaluation: Bilateral ICA <40%.  Bilateral vertebral arteries with normal flow.  Bilateral subclavian arteries are normal flow.   EYE SURGERY     HERNIA REPAIR     POSTERIOR CERVICAL LAMINECTOMY     with fusion C3-C7 10/19- Dr. Bufford at [facility] ECHOCARDIOGRAM  11/2019   (CVA evaluation) EF 55 to 60%.  GR 1 DD.  No RWMA.  Normal atrial sizes.  Mild aortic valve sclerosis otherwise normal valves.   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  eye 06/17/2020   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 04/08/2020   Vitreomacular adhesion of left eye 04/08/2020   Cystoid macular edema of left eye 04/08/2020   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HCC) 01/13/2020   Acute right-sided weakness 01/13/2020   Difficulty urinating 01/13/2020   Stroke (HCC)    CVA (cerebral vascular accident) (HCC) 12/20/2019   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HCC)    PVC (premature ventricular contraction)    Cervical stenosis of spinal canal    Synovitis of hand 09/12/2018   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/25/2018   Failure to thrive in adult 05/26/2017   Failure to thrive in pediatric patient 05/25/2017   Abdominal pulsatile mass 05/25/2017   Anemia 05/25/2017   Essential hypertension 05/25/2017   Gait instability 05/20/2017   Frequent falls 05/20/2017   Depression 05/20/2017    ONSET DATE: 06/14/2024 (referral date)  REFERRING DIAG: M62.81 (ICD-10-CM) - Muscle weakness R53.83 (ICD-10-CM) - Fatigue, unspecified type R26.81 (ICD-10-CM) - Gait instability R29.6 (ICD-10-CM) - Frequent falls  THERAPY DIAG:  Muscle weakness (generalized)  Other abnormalities of gait and mobility  Unsteadiness on feet  Rationale for Evaluation and Treatment: Rehabilitation  SUBJECTIVE:  SUBJECTIVE STATEMENT: Devin Valdez  Pt presents alone, wife stayed in lobby. Pt reports he is tired but denies acute changes. HEP is still challenging    Pt accompanied by: significant other wife Devin Valdez (in lobby)  PERTINENT HISTORY: PMH: anemia, anxiety, cervical spinal stenosis, depression, HLD, HTN, stroke (2021)  Per PCP note 06/07/2024: Patient has a slow shuffling gait.  He has had this ever since his stroke although it seems slightly worse today.  This makes me concerned about  possible parkinsonism.  Patient had the same presentation in December.  He minimizes symptoms and wife is obviously concerned.  She states that he just sits and watches TV all day.  He denies depression or anhedonia.  He denies memory loss and she agrees.  There is no tremor but there is some bradykinesia and postural instability.  I will check a CK level to evaluate for statin this myopathy along with a sedimentation rate.  Rarely with statin.  If labs are normal, I would recommend a neurology consultation for parkinsonian features as well as physical therapy for muscle weakness which I believe is primarily due to deconditioning.   PAIN:  Are you having pain? No  PRECAUTIONS: Fall  RED FLAGS: None   WEIGHT BEARING RESTRICTIONS: No  FALLS: Has patient fallen in last 6 months? No  LIVING ENVIRONMENT: Lives with: lives with their spouse Lives in: House/apartment Stairs: Yes: Internal: 7-10 steps; on right going up and External: 5-6 steps; on right going up Has following equipment at home: Single point cane and Walker - 2 wheeled - doesn't use  PLOF: Independent with gait and Independent with transfers  PATIENT GOALS: get me back to how I was  OBJECTIVE:  Note: Objective measures were completed at Evaluation unless otherwise noted.  DIAGNOSTIC FINDINGS: None updated or relevant to this POC  COGNITION: Overall cognitive status: Within functional limits for tasks assessed   SENSATION: Some N/T in hands, none in legs Light touch WFL in BLE  POSTURE: rounded shoulders, forward head, and posterior pelvic tilt  LOWER EXTREMITY ROM:     Active  Right Eval Left Eval  Hip flexion    Hip extension    Hip abduction    Hip adduction    Hip internal rotation    Hip external rotation    Knee flexion    Knee extension Tight HS Tight HS  Ankle dorsiflexion Tight gastroc Tight gastroc  Ankle plantarflexion    Ankle inversion    Ankle eversion     (Blank rows = not  tested)  LOWER EXTREMITY MMT:    MMT Right Eval Left Eval  Hip flexion 4 4  Hip extension    Hip abduction    Hip adduction    Hip internal rotation    Hip external rotation    Knee flexion 4 4  Knee extension 4 4  Ankle dorsiflexion 5 5  Ankle plantarflexion    Ankle inversion    Ankle eversion    (Blank rows = not tested)  BED MOBILITY:  Has a little bit of trouble getting in/out of bed per patient report  TRANSFERS: Sit to stand: Modified independence  Assistive device utilized: None     Stand to sit: Modified independence  Assistive device utilized: None     Chair to chair: Modified independence  Assistive device utilized: None       RAMP:  Not tested  CURB:  Not tested  STAIRS:  STAIRS:  Level of Assistance: CGA  Psychologist, counselling  Technique: Alternating Pattern  with Bilateral Rails  Number of Stairs: 6   Height of Stairs: 4  Comments: WFL  GAIT: Findings:  Gait pattern: decreased arm swing- Right, decreased arm swing- Left, decreased hip/knee flexion- Right, decreased hip/knee flexion- Left, decreased ankle dorsiflexion- Right, decreased ankle dorsiflexion- Left, poor foot clearance- Right, and poor foot clearance- Left Distance walked: various clinic distances Assistive device utilized: None Level of assistance: SBA Comments: Pt w/single instance of catching R foot today that he was able to correct independently    FUNCTIONAL TESTS:       VITALS  Vitals:   07/30/24 1220  BP: 134/68  Pulse: 71                                                                                                                                  TREATMENT DATE:    Self-Care/Home Management: Assessed in LUE in sitting prior to intervention (see above) and WNL this date.  Ther Act  SciFit multi-peaks level 9.5 for 8 minutes using BUE/BLEs for neural priming for reciprocal movement, dynamic cardiovascular warmup and increased amplitude of stepping. RPE of 7/10  following activity     NMR  6 Blaze pods on random reach setting for improved postural control, stepping strategy, LE coordination, high amplitude movement and single leg stability.  Performed on 2 minute intervals with 3-5 minute seated rest periods.  Pt requires CGA-min A guarding. Round 1:  placed pods in semi-circle from 9-3 o'clock and anchored pt to // bar via purple resistance band around pelvis to add posterolateral resistance when attempting to hit pods.  19 hits. Round 2:  same setup.  21 hits. Notable errors/deficits:  Increased difficulty sidestepping to R side. Increased tapping frequency w/RLE > LLE. Single LOB to L resulting in crossover step w/RLE requiring min A to correct, but pt was able to facilitate a step to try to correct balance. Some shuffling of gait noted when stepping to R side, but inconsistent.  15# Surge carries fwd/retro for improved reactive balance strategies and proximal stability, x40' each direction w/SBA. Max cues for large steps retro, which pt able to perform and sustain this date. Single anterior LOB noted due to scuffing of R foot, but pt able to correct independently.     PATIENT EDUCATION: Education details: Continue HEP, importance of working on hard exercises to improve strength and balance  Person educated: Patient Education method: Medical illustrator Education comprehension: verbalized understanding, verbal cues required, and needs further education  HOME EXERCISE PROGRAM: Access Code: [email or web address] URL: [email or web address] Date: 07/09/2024 Prepared by: Waddell Southgate  Exercises - Wide Tandem Stance with Eyes Open  - 1 x daily - 7 x weekly - 1 sets - 3-5 reps - 30 sec hold  GOALS: Goals reviewed with patient? Yes  SHORT TERM GOALS: Target date: 07/23/2024   Pt will be independent with initial HEP for improved strength, balance, transfers and gait.  Baseline: Goal status: MET  2.  Pt will improve gait velocity to  at least 3.25 ft/sec for improved gait efficiency and performance at SBA level  Baseline: 3.05 ft/sec CGA (8/12); 2.32 ft/s no AD (9/2) Goal status: NOT MET   3.  Pt will improve FGA to 16/30 for decreased fall risk  Baseline: 12/30 (8/12); 16/30 (9/2) Goal status: MET  4.  Pt will improve MiniBest to 19/28 for decreased fall risk and improvement with compensatory stepping strategies.  Baseline: 17/28 (8/19); 13/28 (9/5) Goal status: NOT MET     LONG TERM GOALS: Target date: 08/16/2024   Pt will be independent with final HEP for improved strength, balance, transfers and gait. Baseline:  Goal status: INITIAL  2.  Pt will improve gait velocity to at least 3.1 ft/sec for improved gait efficiency and performance at SBA level  Baseline: 3.05 ft/sec CGA (8/12); 2.32 ft/s no AD (9/2) Goal status: REVISED   3.  Pt will improve FGA to 20/30 for decreased fall risk  Baseline: 12/30 (8/12); 16/30 (9/2) Goal status: INITIAL  4.  Pt will improve MiniBest to 19/28 for decreased fall risk and improvement with compensatory stepping strategies.  Baseline: 17/28 (8/19); 13/28 (9/5) Goal status: REVISED     ASSESSMENT:  CLINICAL IMPRESSION: Emphasis of skilled PT session on improved proximal stability, high amplitude movement, single leg stability and reactive balance. Pt required encouraging cues to participate in session today due to interventions being hard and tiring, but pt agreeable to try once educated on importance of high intensity training. Pt demonstrated improved stepping strategies today but did note inconsistent shuffling/freezing of gait, especially when stepping to R side. Pt also tends to scuff his R foot more than his L foot. Continue POC.   OBJECTIVE IMPAIRMENTS: Abnormal gait, decreased balance, decreased mobility, decreased ROM, decreased strength, and impaired perceived functional ability.   ACTIVITY LIMITATIONS: carrying, lifting, bending, standing, squatting,  stairs, and bed mobility  PARTICIPATION LIMITATIONS: meal prep, cleaning, laundry, driving, shopping, community activity, and yard work  PERSONAL FACTORS: Age, Time since onset of injury/illness/exacerbation, and 3+ comorbidities:   anemia, anxiety, cervical spinal stenosis, depression, HLD, HTN, stroke (2021)are also affecting patient's functional outcome.   REHAB POTENTIAL: Fair time since onset, motivation level  CLINICAL DECISION MAKING: Evolving/moderate complexity  EVALUATION COMPLEXITY: Moderate  PLAN:  PT FREQUENCY: 1-2x/week  PT DURATION: 6 weeks  PLANNED INTERVENTIONS: 97110-Therapeutic exercises, 97530- Therapeutic activity, 97112- Neuromuscular re-education, 97535- Self Care, and [id number]- Manual therapy  PLAN FOR NEXT SESSION:  add to HEP to work on functional strengthening and to address gait and balance impairments (tends to catch his feet when walking, head turns, backwards, tandem- pt has poor retention, likely need to have wife present for this), trial LRAD?, Parkinsonian type symptoms per PCP - referred to [facility] and scheduled in November, tends to lose his balance posteriorly, stepping strategy, cog dual tasking, work on facilitation of ankle/hip strategy, blaze pods, high amplitude movement, resistance training    Parnell Spieler E Tarahji Ramthun, PT, DPT  07/30/2024, 1:04 PM

## 2024-08-02 ENCOUNTER — Ambulatory Visit: Admitting: Physical Therapy

## 2024-08-02 VITALS — BP 148/68 | HR 72

## 2024-08-02 DIAGNOSIS — R2681 Unsteadiness on feet: Secondary | ICD-10-CM

## 2024-08-02 DIAGNOSIS — M6281 Muscle weakness (generalized): Secondary | ICD-10-CM

## 2024-08-02 DIAGNOSIS — R2689 Other abnormalities of gait and mobility: Secondary | ICD-10-CM

## 2024-08-02 NOTE — Therapy (Signed)
 OUTPATIENT PHYSICAL THERAPY NEURO TREATMENT   Patient Name: Devin Valdez MRN: [id number] DOB:1941/11/10, 83 y.o., male Today's Date: 08/02/2024   PCP: [name], MD REFERRING PROVIDER: [name], MD  END OF SESSION:  PT End of Session - 08/02/24 1249     Visit Number 8    Number of Visits 13   with eval   Date for PT Re-Evaluation 08/27/24    Authorization Type Medicare    PT Start Time 1245   pt agreeable to start early   PT Stop Time 1330    PT Time Calculation (min) 45 min    Equipment Utilized During Treatment Gait belt    Activity Tolerance Patient tolerated treatment well    Behavior During Therapy [facility] for tasks assessed/performed                 Past Medical History:  Diagnosis Date   Anemia    Anxiety    Cervical stenosis of spinal canal    mri 8/19- severe   Depression    Falls    Hallucinations    Hyperlipidemia    Hypertension    Stroke (HCC)    left paracentral pons infarct   Past Surgical History:  Procedure Laterality Date   Carotid Dopplers  12/2019   CVA evaluation: Bilateral ICA <40%.  Bilateral vertebral arteries with normal flow.  Bilateral subclavian arteries are normal flow.   EYE SURGERY     HERNIA REPAIR     POSTERIOR CERVICAL LAMINECTOMY     with fusion C3-C7 10/19- Dr. Bufford at [facility] ECHOCARDIOGRAM  11/2019   (CVA evaluation) EF 55 to 60%.  GR 1 DD.  No RWMA.  Normal atrial sizes.  Mild aortic valve sclerosis otherwise normal valves.   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 05/30/2022   Near syncope 05/11/2022   Intermediate stage nonexudative age-related macular degeneration of right eye 12/30/2020   Paresthesia of both hands 08/25/2020   Cervical myelopathy (HCC) 08/25/2020   Left pontine stroke (HCC) 08/25/2020   Intermediate stage nonexudative age-related  macular degeneration of left eye 06/17/2020   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 04/08/2020   Vitreomacular adhesion of left eye 04/08/2020   Cystoid macular edema of left eye 04/08/2020   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HCC) 01/13/2020   Acute right-sided weakness 01/13/2020   Difficulty urinating 01/13/2020   Stroke (HCC)    CVA (cerebral vascular accident) (HCC) 12/20/2019   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HCC)    PVC (premature ventricular contraction)    Cervical stenosis of spinal canal    Synovitis of hand 09/12/2018   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/25/2018   Failure to thrive in adult 05/26/2017   Failure to thrive in pediatric patient 05/25/2017   Abdominal pulsatile mass 05/25/2017   Anemia 05/25/2017   Essential hypertension 05/25/2017   Gait instability 05/20/2017   Frequent falls 05/20/2017   Depression 05/20/2017    ONSET DATE: 06/14/2024 (referral date)  REFERRING DIAG: M62.81 (ICD-10-CM) - Muscle weakness R53.83 (ICD-10-CM) - Fatigue, unspecified type R26.81 (ICD-10-CM) - Gait instability R29.6 (ICD-10-CM) - Frequent falls  THERAPY DIAG:  Muscle weakness (generalized)  Other abnormalities of gait and mobility  Unsteadiness on feet  Rationale for Evaluation and Treatment: Rehabilitation  SUBJECTIVE:  SUBJECTIVE STATEMENT: Devin Valdez  Pt presents alone, wife stayed in lobby. Pt denies any acute changes since last visit and denies any pain today. Pt reports that his HEP is maybe too much of a challenge.   Pt accompanied by: significant other wife Devin Valdez (in lobby)  PERTINENT HISTORY: PMH: anemia, anxiety, cervical spinal stenosis, depression, HLD, HTN, stroke (2021)  Per PCP note 06/07/2024: Patient has a slow shuffling gait.  He has had this ever since his  stroke although it seems slightly worse today.  This makes me concerned about possible parkinsonism.  Patient had the same presentation in December.  He minimizes symptoms and wife is obviously concerned.  She states that he just sits and watches TV all day.  He denies depression or anhedonia.  He denies memory loss and she agrees.  There is no tremor but there is some bradykinesia and postural instability.  I will check a CK level to evaluate for statin this myopathy along with a sedimentation rate.  Rarely with statin.  If labs are normal, I would recommend a neurology consultation for parkinsonian features as well as physical therapy for muscle weakness which I believe is primarily due to deconditioning.   PAIN:  Are you having pain? No  PRECAUTIONS: Fall  RED FLAGS: None   WEIGHT BEARING RESTRICTIONS: No  FALLS: Has patient fallen in last 6 months? No  LIVING ENVIRONMENT: Lives with: lives with their spouse Lives in: House/apartment Stairs: Yes: Internal: 7-10 steps; on right going up and External: 5-6 steps; on right going up Has following equipment at home: Single point cane and Walker - 2 wheeled - doesn't use  PLOF: Independent with gait and Independent with transfers  PATIENT GOALS: get me back to how I was  OBJECTIVE:  Note: Objective measures were completed at Evaluation unless otherwise noted.  DIAGNOSTIC FINDINGS: None updated or relevant to this POC  COGNITION: Overall cognitive status: Within functional limits for tasks assessed   SENSATION: Some N/T in hands, none in legs Light touch WFL in BLE  POSTURE: rounded shoulders, forward head, and posterior pelvic tilt  LOWER EXTREMITY ROM:     Active  Right Eval Left Eval  Hip flexion    Hip extension    Hip abduction    Hip adduction    Hip internal rotation    Hip external rotation    Knee flexion    Knee extension Tight HS Tight HS  Ankle dorsiflexion Tight gastroc Tight gastroc  Ankle  plantarflexion    Ankle inversion    Ankle eversion     (Blank rows = not tested)  LOWER EXTREMITY MMT:    MMT Right Eval Left Eval  Hip flexion 4 4  Hip extension    Hip abduction    Hip adduction    Hip internal rotation    Hip external rotation    Knee flexion 4 4  Knee extension 4 4  Ankle dorsiflexion 5 5  Ankle plantarflexion    Ankle inversion    Ankle eversion    (Blank rows = not tested)  BED MOBILITY:  Has a little bit of trouble getting in/out of bed per patient report  TRANSFERS: Sit to stand: Modified independence  Assistive device utilized: None     Stand to sit: Modified independence  Assistive device utilized: None     Chair to chair: Modified independence  Assistive device utilized: None       RAMP:  Not tested  CURB:  Not tested  STAIRS:  STAIRS:  Level of Assistance: CGA  Stair Negotiation Technique: Alternating Pattern  with Bilateral Rails  Number of Stairs: 6   Height of Stairs: 4  Comments: WFL  GAIT: Findings:  Gait pattern: decreased arm swing- Right, decreased arm swing- Left, decreased hip/knee flexion- Right, decreased hip/knee flexion- Left, decreased ankle dorsiflexion- Right, decreased ankle dorsiflexion- Left, poor foot clearance- Right, and poor foot clearance- Left Distance walked: various clinic distances Assistive device utilized: None Level of assistance: SBA Comments: Pt w/single instance of catching R foot today that he was able to correct independently    FUNCTIONAL TESTS:       VITALS  Vitals:   08/02/24 1251  BP: (!) 148/68  Pulse: 72                                                                                                                                 TREATMENT DATE:    Self-Care/Home Management: Assessed in LUE in sitting prior to intervention (see above) and slightly elevated but within safe limits this date.  Ther Act  SciFit multi-peaks level 11.5 for 8 minutes using BUE/BLEs for neural  priming for reciprocal movement, dynamic cardiovascular warmup and increased amplitude of stepping. RPE of 7/10 following activity.     NMR  To work on trialing various balance and functional strengthening exercises to determine what would be appropriate to add to HEP: Staggered sit to stands from EOM with no UE support X 10 ft each with L/R LE forwards Tandem gait at countertop with one UE support 6 x 10 ft Added exercises to HEP, see bolded below To work on resisted movements, stepping strategy,and dynamic balance: With purple resistance band around hips: Alt L/R vinyl dot taps from static stance Pt struggles with cues for L and R, several LOB but able to recover Alt L/R vinyl dot taps with gait Improved ability to alternate L/R with visual cues and recover his balance independently    PATIENT EDUCATION: Education details: Continue HEP, importance of working on hard exercises to improve strength and balance, added to HEP and provided edu to wife in lobby regarding updated HEP Person educated: Patient, wife Devin Valdez Education method: Medical illustrator, Handouts Education comprehension: verbalized understanding, returned demonstration, verbal cues required, and needs further education  HOME EXERCISE PROGRAM: Access Code: [email or web address] URL: [email or web address] Date: 07/09/2024 Prepared by: Waddell Southgate  Exercises - Wide Tandem Stance with Eyes Open  - 1 x daily - 7 x weekly - 1 sets - 3-5 reps - 30 sec hold - Staggered Sit-to-Stand  - 1 x daily - 7 x weekly - 1-2 sets - 10 reps - Tandem Walking with Counter Support  - 1 x daily - 7 x weekly - 3 sets - 10 reps  GOALS: Goals reviewed with patient? Yes  SHORT TERM GOALS: Target date: 07/23/2024   Pt will be independent with initial HEP for improved strength, balance, transfers and  gait. Baseline: Goal status: MET  2.  Pt will improve gait velocity to at least 3.25 ft/sec for improved gait efficiency  and performance at SBA level  Baseline: 3.05 ft/sec CGA (8/12); 2.32 ft/s no AD (9/2) Goal status: NOT MET   3.  Pt will improve FGA to 16/30 for decreased fall risk  Baseline: 12/30 (8/12); 16/30 (9/2) Goal status: MET  4.  Pt will improve MiniBest to 19/28 for decreased fall risk and improvement with compensatory stepping strategies.  Baseline: 17/28 (8/19); 13/28 (9/5) Goal status: NOT MET     LONG TERM GOALS: Target date: 08/16/2024   Pt will be independent with final HEP for improved strength, balance, transfers and gait. Baseline:  Goal status: INITIAL  2.  Pt will improve gait velocity to at least 3.1 ft/sec for improved gait efficiency and performance at SBA level  Baseline: 3.05 ft/sec CGA (8/12); 2.32 ft/s no AD (9/2) Goal status: REVISED   3.  Pt will improve FGA to 20/30 for decreased fall risk  Baseline: 12/30 (8/12); 16/30 (9/2) Goal status: INITIAL  4.  Pt will improve MiniBest to 19/28 for decreased fall risk and improvement with compensatory stepping strategies.  Baseline: 17/28 (8/19); 13/28 (9/5) Goal status: REVISED     ASSESSMENT:  CLINICAL IMPRESSION: Emphasis of skilled PT session on adding balance and functional strengthening exercises to his HEP as well as continuing to work on reactive balance and stepping strategies. He exhibits improved ability to recover his balance with LOB without external assist this session. He continues to require education on purpose of challenging exercises in therapy in order for him to improve his strength, endurance, and balance. Continue POC.   OBJECTIVE IMPAIRMENTS: Abnormal gait, decreased balance, decreased mobility, decreased ROM, decreased strength, and impaired perceived functional ability.   ACTIVITY LIMITATIONS: carrying, lifting, bending, standing, squatting, stairs, and bed mobility  PARTICIPATION LIMITATIONS: meal prep, cleaning, laundry, driving, shopping, community activity, and yard work  PERSONAL  FACTORS: Age, Time since onset of injury/illness/exacerbation, and 3+ comorbidities:   anemia, anxiety, cervical spinal stenosis, depression, HLD, HTN, stroke (2021)are also affecting patient's functional outcome.   REHAB POTENTIAL: Fair time since onset, motivation level  CLINICAL DECISION MAKING: Evolving/moderate complexity  EVALUATION COMPLEXITY: Moderate  PLAN:  PT FREQUENCY: 1-2x/week  PT DURATION: 6 weeks  PLANNED INTERVENTIONS: 97110-Therapeutic exercises, 97530- Therapeutic activity, 97112- Neuromuscular re-education, 97535- Self Care, and [id number]- Manual therapy  PLAN FOR NEXT SESSION:  add to HEP to work on functional strengthening and to address gait and balance impairments (tends to catch his feet when walking, head turns, backwards, tandem- pt has poor retention, likely need to have wife present for this), trial LRAD?, Parkinsonian type symptoms per PCP - referred to [facility] and scheduled in November, tends to lose his balance posteriorly, stepping strategy, cog dual tasking, work on facilitation of ankle/hip strategy, blaze pods, high amplitude movement, resistance training    Waddell Southgate, PT Waddell Southgate, PT, DPT, CSRS  08/02/2024, 1:37 PM

## 2024-08-06 ENCOUNTER — Ambulatory Visit: Admitting: Physical Therapy

## 2024-08-06 VITALS — BP 130/65 | HR 75

## 2024-08-06 DIAGNOSIS — M6281 Muscle weakness (generalized): Secondary | ICD-10-CM

## 2024-08-06 DIAGNOSIS — R2689 Other abnormalities of gait and mobility: Secondary | ICD-10-CM

## 2024-08-06 DIAGNOSIS — R2681 Unsteadiness on feet: Secondary | ICD-10-CM | POA: Diagnosis not present

## 2024-08-06 NOTE — Therapy (Signed)
 OUTPATIENT PHYSICAL THERAPY NEURO TREATMENT   Patient Name: Devin Valdez MRN: [id number] DOB:May 21, 1941, 83 y.o., male Today's Date: 08/06/2024   PCP: Devin Butler DASEN, MD REFERRING PROVIDER: [name], MD  END OF SESSION:  PT End of Session - 08/06/24 1315     Visit Number 9    Number of Visits 13   with eval   Date for PT Re-Evaluation 08/27/24    Authorization Type Medicare    PT Start Time 1313    PT Stop Time 1357    PT Time Calculation (min) 44 min    Equipment Utilized During Treatment Gait belt    Activity Tolerance Patient tolerated treatment well    Behavior During Therapy WFL for tasks assessed/performed                  Past Medical History:  Diagnosis Date   Anemia    Anxiety    Cervical stenosis of spinal canal    mri 8/19- severe   Depression    Falls    Hallucinations    Hyperlipidemia    Hypertension    Stroke (HCC)    left paracentral pons infarct   Past Surgical History:  Procedure Laterality Date   Carotid Dopplers  12/2019   CVA evaluation: Bilateral ICA <40%.  Bilateral vertebral arteries with normal flow.  Bilateral subclavian arteries are normal flow.   EYE SURGERY     HERNIA REPAIR     POSTERIOR CERVICAL LAMINECTOMY     with fusion C3-C7 10/19- Dr. Bufford at [facility] ECHOCARDIOGRAM  11/2019   (CVA evaluation) EF 55 to 60%.  GR 1 DD.  No RWMA.  Normal atrial sizes.  Mild aortic valve sclerosis otherwise normal valves.   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  left eye 06/17/2020   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 04/08/2020   Vitreomacular adhesion of left eye 04/08/2020   Cystoid macular edema of left eye 04/08/2020   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HCC) 01/13/2020   Acute right-sided weakness 01/13/2020   Difficulty urinating 01/13/2020   Stroke (HCC)    CVA (cerebral vascular accident) (HCC) 12/20/2019   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HCC)    PVC (premature ventricular contraction)    Cervical stenosis of spinal canal    Synovitis of hand 09/12/2018   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/25/2018   Failure to thrive in adult 05/26/2017   Failure to thrive in pediatric patient 05/25/2017   Abdominal pulsatile mass 05/25/2017   Anemia 05/25/2017   Essential hypertension 05/25/2017   Gait instability 05/20/2017   Frequent falls 05/20/2017   Depression 05/20/2017    ONSET DATE: 06/14/2024 (referral date)  REFERRING DIAG: M62.81 (ICD-10-CM) - Muscle weakness R53.83 (ICD-10-CM) - Fatigue, unspecified type R26.81 (ICD-10-CM) - Gait instability R29.6 (ICD-10-CM) - Frequent falls  THERAPY DIAG:  Muscle weakness (generalized)  Other abnormalities of gait and mobility  Unsteadiness on feet  Rationale for Evaluation and Treatment: Rehabilitation  SUBJECTIVE:  SUBJECTIVE STATEMENT: Devin Valdez  Pt presents alone, wife stayed in lobby. Pt denies any acute changes since last visit and denies any pain today other than usual aches and pains. Pt reports that his new exercises are a little hard but does clarify that they are challenging but doable.   Pt accompanied by: significant other wife Devin Valdez (in lobby)  PERTINENT HISTORY: PMH: anemia, anxiety, cervical spinal stenosis, depression, HLD, HTN, stroke (2021)  Per PCP note 06/07/2024: Patient  has a slow shuffling gait.  He has had this ever since his stroke although it seems slightly worse today.  This makes me concerned about possible parkinsonism.  Patient had the same presentation in December.  He minimizes symptoms and wife is obviously concerned.  She states that he just sits and watches TV all day.  He denies depression or anhedonia.  He denies memory loss and she agrees.  There is no tremor but there is some bradykinesia and postural instability.  I will check a CK level to evaluate for statin this myopathy along with a sedimentation rate.  Rarely with statin.  If labs are normal, I would recommend a neurology consultation for parkinsonian features as well as physical therapy for muscle weakness which I believe is primarily due to deconditioning.   PAIN:  Are you having pain? No  PRECAUTIONS: Fall  RED FLAGS: None   WEIGHT BEARING RESTRICTIONS: No  FALLS: Has patient fallen in last 6 months? No  LIVING ENVIRONMENT: Lives with: lives with their spouse Lives in: House/apartment Stairs: Yes: Internal: 7-10 steps; on right going up and External: 5-6 steps; on right going up Has following equipment at home: Single point cane and Walker - 2 wheeled - doesn't use  PLOF: Independent with gait and Independent with transfers  PATIENT GOALS: get me back to how I was  OBJECTIVE:  Note: Objective measures were completed at Evaluation unless otherwise noted.  DIAGNOSTIC FINDINGS: None updated or relevant to this POC  COGNITION: Overall cognitive status: Within functional limits for tasks assessed   SENSATION: Some N/T in hands, none in legs Light touch WFL in BLE  POSTURE: rounded shoulders, forward head, and posterior pelvic tilt  LOWER EXTREMITY ROM:     Active  Right Eval Left Eval  Hip flexion    Hip extension    Hip abduction    Hip adduction    Hip internal rotation    Hip external rotation    Knee flexion    Knee extension Tight HS Tight HS  Ankle  dorsiflexion Tight gastroc Tight gastroc  Ankle plantarflexion    Ankle inversion    Ankle eversion     (Blank rows = not tested)  LOWER EXTREMITY MMT:    MMT Right Eval Left Eval  Hip flexion 4 4  Hip extension    Hip abduction    Hip adduction    Hip internal rotation    Hip external rotation    Knee flexion 4 4  Knee extension 4 4  Ankle dorsiflexion 5 5  Ankle plantarflexion    Ankle inversion    Ankle eversion    (Blank rows = not tested)  BED MOBILITY:  Has a little bit of trouble getting in/out of bed per patient report  TRANSFERS: Sit to stand: Modified independence  Assistive device utilized: None     Stand to sit: Modified independence  Assistive device utilized: None     Chair to chair: Modified independence  Assistive device utilized: None  RAMP:  Not tested  CURB:  Not tested  STAIRS:  STAIRS:  Level of Assistance: CGA  Stair Negotiation Technique: Alternating Pattern  with Bilateral Rails  Number of Stairs: 6   Height of Stairs: 4  Comments: WFL  GAIT: Findings:  Gait pattern: decreased arm swing- Right, decreased arm swing- Left, decreased hip/knee flexion- Right, decreased hip/knee flexion- Left, decreased ankle dorsiflexion- Right, decreased ankle dorsiflexion- Left, poor foot clearance- Right, and poor foot clearance- Left Distance walked: various clinic distances Assistive device utilized: None Level of assistance: SBA Comments: Pt w/single instance of catching R foot today that he was able to correct independently    FUNCTIONAL TESTS:       VITALS  Vitals:   08/06/24 1318  BP: 130/65  Pulse: 75                                                                                                                                  TREATMENT DATE:    Self-Care/Home Management: Assessed in LUE in sitting prior to intervention (see above) and within safe limits this date.  Ther Act  SciFit multi-peaks level 12 for 8 minutes  using BUE/BLEs for neural priming for reciprocal movement, dynamic cardiovascular warmup and increased amplitude of stepping. RPE of 7/10 following activity.     NMR  To work on functional strengthening and dynamic stepping: Sit to stands with 8# weighted ball with alt L/R forwards step with ball toss to blue mat 2 x 10 reps B Several anterior LOB when stepping with RLE but able to recover with min A Sit to stands onto blue mat with 8# weighted ball chest press hard 2 x 10 reps    PATIENT EDUCATION: Education details: Continue HEP, importance of working on hard exercises to improve strength and balance Person educated: Patient Education method: Medical illustrator Education comprehension: verbalized understanding, returned demonstration, verbal cues required, and needs further education  HOME EXERCISE PROGRAM: Access Code: [email or web address] URL: [email or web address] Date: 07/09/2024 Prepared by: Waddell Southgate  Exercises - Wide Tandem Stance with Eyes Open  - 1 x daily - 7 x weekly - 1 sets - 3-5 reps - 30 sec hold - Staggered Sit-to-Stand  - 1 x daily - 7 x weekly - 1-2 sets - 10 reps - Tandem Walking with Counter Support  - 1 x daily - 7 x weekly - 3 sets - 10 reps  GOALS: Goals reviewed with patient? Yes  SHORT TERM GOALS: Target date: 07/23/2024   Pt will be independent with initial HEP for improved strength, balance, transfers and gait. Baseline: Goal status: MET  2.  Pt will improve gait velocity to at least 3.25 ft/sec for improved gait efficiency and performance at SBA level  Baseline: 3.05 ft/sec CGA (8/12); 2.32 ft/s no AD (9/2) Goal status: NOT MET   3.  Pt will improve FGA to 16/30 for decreased fall risk  Baseline: 12/30 (8/12); 16/30 (  9/2) Goal status: MET  4.  Pt will improve MiniBest to 19/28 for decreased fall risk and improvement with compensatory stepping strategies.  Baseline: 17/28 (8/19); 13/28 (9/5) Goal status: NOT MET      LONG TERM GOALS: Target date: 08/16/2024   Pt will be independent with final HEP for improved strength, balance, transfers and gait. Baseline:  Goal status: INITIAL  2.  Pt will improve gait velocity to at least 3.1 ft/sec for improved gait efficiency and performance at SBA level  Baseline: 3.05 ft/sec CGA (8/12); 2.32 ft/s no AD (9/2) Goal status: REVISED   3.  Pt will improve FGA to 20/30 for decreased fall risk  Baseline: 12/30 (8/12); 16/30 (9/2) Goal status: INITIAL  4.  Pt will improve MiniBest to 19/28 for decreased fall risk and improvement with compensatory stepping strategies.  Baseline: 17/28 (8/19); 13/28 (9/5) Goal status: REVISED     ASSESSMENT:  CLINICAL IMPRESSION: Emphasis of skilled PT session on continuing to work on functional strengthening and dynamic balance challenges to improve his strength and decrease his fall risk. He is challenged by therapy tasks this date but exhibits good motivation and participation in session as compared to previous sessions. He continues to benefit from skilled PT services in order to improve his strength, endurance, and balance in order to decrease his fall risk. Continue POC.   OBJECTIVE IMPAIRMENTS: Abnormal gait, decreased balance, decreased mobility, decreased ROM, decreased strength, and impaired perceived functional ability.   ACTIVITY LIMITATIONS: carrying, lifting, bending, standing, squatting, stairs, and bed mobility  PARTICIPATION LIMITATIONS: meal prep, cleaning, laundry, driving, shopping, community activity, and yard work  PERSONAL FACTORS: Age, Time since onset of injury/illness/exacerbation, and 3+ comorbidities:   anemia, anxiety, cervical spinal stenosis, depression, HLD, HTN, stroke (2021)are also affecting patient's functional outcome.   REHAB POTENTIAL: Fair time since onset, motivation level  CLINICAL DECISION MAKING: Evolving/moderate complexity  EVALUATION COMPLEXITY: Moderate  PLAN:  PT  FREQUENCY: 1-2x/week  PT DURATION: 6 weeks  PLANNED INTERVENTIONS: 97110-Therapeutic exercises, 97530- Therapeutic activity, 97112- Neuromuscular re-education, 97535- Self Care, and [id number]- Manual therapy  PLAN FOR NEXT SESSION:  10th PN, add to HEP to work on functional strengthening and to address gait and balance impairments (tends to catch his feet when walking, head turns, backwards, tandem- pt has poor retention, likely need to have wife present for this), trial LRAD?, Parkinsonian type symptoms per PCP - referred to [facility] and scheduled in November, tends to lose his balance posteriorly, stepping strategy, cog dual tasking, work on facilitation of ankle/hip strategy, blaze pods, high amplitude movement, resistance training   Waddell Southgate, PT Waddell Southgate, PT, DPT, CSRS  08/06/2024, 1:59 PM

## 2024-08-08 ENCOUNTER — Ambulatory Visit: Admitting: Physical Therapy

## 2024-08-08 VITALS — BP 114/59 | HR 76

## 2024-08-08 DIAGNOSIS — R2681 Unsteadiness on feet: Secondary | ICD-10-CM

## 2024-08-08 DIAGNOSIS — R2689 Other abnormalities of gait and mobility: Secondary | ICD-10-CM | POA: Diagnosis not present

## 2024-08-08 DIAGNOSIS — M6281 Muscle weakness (generalized): Secondary | ICD-10-CM | POA: Diagnosis not present

## 2024-08-08 NOTE — Therapy (Signed)
 OUTPATIENT PHYSICAL THERAPY NEURO TREATMENT - 10th VISIT PROGRESS NOTE   Patient Name: Devin Valdez MRN: [id number] DOB:[birth year], 83 y.o., male Today's Date: 08/08/2024   PCP: Devin Butler DASEN, MD REFERRING PROVIDER: [name], MD  Physical Therapy Progress Note   Dates of Reporting Period: 07/02/2024 - 08/08/2024  See Note below for Objective Data and Assessment of Progress/Goals.  Thank you for the referral of this patient. Waddell Southgate, PT, DPT, CSRS   END OF SESSION:  PT End of Session - 08/08/24 1315     Visit Number 10    Number of Visits 13   with eval   Date for Recertification  08/27/24    Authorization Type Medicare    PT Start Time 1315    PT Stop Time 1357    PT Time Calculation (min) 42 min    Equipment Utilized During Treatment Gait belt    Activity Tolerance Patient tolerated treatment well    Behavior During Therapy WFL for tasks assessed/performed                   Past Medical History:  Diagnosis Date   Anemia    Anxiety    Cervical stenosis of spinal canal    mri 8/19- severe   Depression    Falls    Hallucinations    Hyperlipidemia    Hypertension    Stroke (HCC)    left paracentral pons infarct   Past Surgical History:  Procedure Laterality Date   Carotid Dopplers  12/2019   CVA evaluation: Bilateral ICA <40%.  Bilateral vertebral arteries with normal flow.  Bilateral subclavian arteries are normal flow.   EYE SURGERY     HERNIA REPAIR     POSTERIOR CERVICAL LAMINECTOMY     with fusion C3-C7 10/19- Dr. Bufford at [facility] ECHOCARDIOGRAM  11/2019   (CVA evaluation) EF 55 to 60%.  GR 1 DD.  No RWMA.  Normal atrial sizes.  Mild aortic valve sclerosis otherwise normal valves.   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 05/30/2022   Near syncope 05/11/2022   Intermediate  stage nonexudative age-related macular degeneration of right eye 12/30/2020   Paresthesia of both hands 08/25/2020   Cervical myelopathy (HCC) 08/25/2020   Left pontine stroke (HCC) 08/25/2020   Intermediate stage nonexudative age-related macular degeneration of left eye 06/17/2020   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 04/08/2020   Vitreomacular adhesion of left eye 04/08/2020   Cystoid macular edema of left eye 04/08/2020   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HCC) 01/13/2020   Acute right-sided weakness 01/13/2020   Difficulty urinating 01/13/2020   Stroke (HCC)    CVA (cerebral vascular accident) (HCC) 12/20/2019   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HCC)    PVC (premature ventricular contraction)    Cervical stenosis of spinal canal    Synovitis of hand 09/12/2018   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/25/2018   Failure to thrive in adult 05/26/2017   Failure to thrive in pediatric patient 05/25/2017   Abdominal pulsatile mass 05/25/2017   Anemia 05/25/2017   Essential hypertension 05/25/2017   Gait instability 05/20/2017   Frequent falls 05/20/2017   Depression 05/20/2017    ONSET DATE: 06/14/2024 (referral date)  REFERRING DIAG: M62.81 (ICD-10-CM) - Muscle weakness R53.83 (ICD-10-CM) - Fatigue, unspecified type R26.81 (ICD-10-CM) - Gait instability R29.6 (  ICD-10-CM) - Frequent falls  THERAPY DIAG:  Muscle weakness (generalized)  Other abnormalities of gait and mobility  Unsteadiness on feet  Rationale for Evaluation and Treatment: Rehabilitation  SUBJECTIVE:                                                                                                                                                                                             SUBJECTIVE STATEMENT: [name]  Pt presents alone, wife stayed in lobby. Pt denies any acute changes since last visit. Pt reports that his new exercises are hard but they are doable.  Pt accompanied by: significant other  wife Devin Valdez (in lobby)  PERTINENT HISTORY: PMH: anemia, anxiety, cervical spinal stenosis, depression, HLD, HTN, stroke (2021)  Per PCP note 06/07/2024: Patient has a slow shuffling gait.  He has had this ever since his stroke although it seems slightly worse today.  This makes me concerned about possible parkinsonism.  Patient had the same presentation in December.  He minimizes symptoms and wife is obviously concerned.  She states that he just sits and watches TV all day.  He denies depression or anhedonia.  He denies memory loss and she agrees.  There is no tremor but there is some bradykinesia and postural instability.  I will check a CK level to evaluate for statin this myopathy along with a sedimentation rate.  Rarely with statin.  If labs are normal, I would recommend a neurology consultation for parkinsonian features as well as physical therapy for muscle weakness which I believe is primarily due to deconditioning.   PAIN:  Are you having pain? No  PRECAUTIONS: Fall  RED FLAGS: None   WEIGHT BEARING RESTRICTIONS: No  FALLS: Has patient fallen in last 6 months? No  LIVING ENVIRONMENT: Lives with: lives with their spouse Lives in: House/apartment Stairs: Yes: Internal: 7-10 steps; on right going up and External: 5-6 steps; on right going up Has following equipment at home: Single point cane and Walker - 2 wheeled - doesn't use  PLOF: Independent with gait and Independent with transfers  PATIENT GOALS: get me back to how I was  OBJECTIVE:  Note: Objective measures were completed at Evaluation unless otherwise noted.  DIAGNOSTIC FINDINGS: None updated or relevant to this POC  COGNITION: Overall cognitive status: Within functional limits for tasks assessed   SENSATION: Some N/T in hands, none in legs Light touch WFL in BLE  POSTURE: rounded shoulders, forward head, and posterior pelvic tilt  LOWER EXTREMITY ROM:     Active  Right Eval Left Eval  Hip flexion    Hip  extension    Hip abduction  Hip adduction    Hip internal rotation    Hip external rotation    Knee flexion    Knee extension Tight HS Tight HS  Ankle dorsiflexion Tight gastroc Tight gastroc  Ankle plantarflexion    Ankle inversion    Ankle eversion     (Blank rows = not tested)  LOWER EXTREMITY MMT:    MMT Right Eval Left Eval  Hip flexion 4 4  Hip extension    Hip abduction    Hip adduction    Hip internal rotation    Hip external rotation    Knee flexion 4 4  Knee extension 4 4  Ankle dorsiflexion 5 5  Ankle plantarflexion    Ankle inversion    Ankle eversion    (Blank rows = not tested)  BED MOBILITY:  Has a little bit of trouble getting in/out of bed per patient report  TRANSFERS: Sit to stand: Modified independence  Assistive device utilized: None     Stand to sit: Modified independence  Assistive device utilized: None     Chair to chair: Modified independence  Assistive device utilized: None       RAMP:  Not tested  CURB:  Not tested  STAIRS:  STAIRS:  Level of Assistance: CGA  Stair Negotiation Technique: Alternating Pattern  with Bilateral Rails  Number of Stairs: 6   Height of Stairs: 4  Comments: WFL  GAIT: Findings:  Gait pattern: decreased arm swing- Right, decreased arm swing- Left, decreased hip/knee flexion- Right, decreased hip/knee flexion- Left, decreased ankle dorsiflexion- Right, decreased ankle dorsiflexion- Left, poor foot clearance- Right, and poor foot clearance- Left Distance walked: various clinic distances Assistive device utilized: None Level of assistance: SBA Comments: Pt w/single instance of catching R foot today that he was able to correct independently    FUNCTIONAL TESTS:    [facility] PT Assessment - 08/08/24 1321       Ambulation/Gait   Gait velocity 32.8 ft over 11.66 sec = 2.8 ft/sec            VITALS  Vitals:   08/08/24 1318  BP: (!) 114/59  Pulse: 76                                                                                                                                    TREATMENT DATE:    Self-Care/Home Management: Assessed in LUE in sitting prior to intervention (see above) and within safe limits this date.   NMR  To work on functional strengthening : Sit to stands with 9# weighted dowel with chest press 3 x 10 reps, RPE 7/10 Pt needs encouragement to perform last set due to fatigue  3 Blaze pods on random setting for improved dynamic stepping and reaching. Pods placed 2 on floor lateral to patient and 1 on mirror in front of patient.  Performed on 1 minute intervals with 30 rest periods.  Pt requires  CGA guarding with purple resistance band around his hips for last 2 rounds. Round 1: 13 hits. Round 2: 15 hits. Round 3: 15 hits. Notable errors/deficits:  some LOB with cross over stepping, able to catch himself without external assist  Round 1: 29 hits (2 min)      Physical Performance  [facility] PT Assessment - 08/08/24 1321       Ambulation/Gait   Gait velocity 32.8 ft over 11.66 sec = 2.8 ft/sec             PATIENT EDUCATION: Education details: Continue HEP, importance of working on hard exercises to improve strength and balance, plan to d/c at end of POC and have him continue with his HEP Person educated: Patient, Devin Valdez Education method: Medical illustrator Education comprehension: verbalized understanding, returned demonstration, verbal cues required, and needs further education  HOME EXERCISE PROGRAM: Access Code: [email or web address] URL: [email or web address] Date: 07/09/2024 Prepared by: Waddell Southgate  Exercises - Wide Tandem Stance with Eyes Open  - 1 x daily - 7 x weekly - 1 sets - 3-5 reps - 30 sec hold - Staggered Sit-to-Stand  - 1 x daily - 7 x weekly - 1-2 sets - 10 reps - Tandem Walking with Counter Support  - 1 x daily - 7 x weekly - 3 sets - 10 reps  GOALS: Goals reviewed with patient? Yes  SHORT TERM GOALS:  Target date: 07/23/2024   Pt will be independent with initial HEP for improved strength, balance, transfers and gait. Baseline: Goal status: MET  2.  Pt will improve gait velocity to at least 3.25 ft/sec for improved gait efficiency and performance at SBA level  Baseline: 3.05 ft/sec CGA (8/12); 2.32 ft/s no AD (9/2), 2.8 ft/sec no AD (9/18) Goal status: NOT MET   3.  Pt will improve FGA to 16/30 for decreased fall risk  Baseline: 12/30 (8/12); 16/30 (9/2) Goal status: MET  4.  Pt will improve MiniBest to 19/28 for decreased fall risk and improvement with compensatory stepping strategies.  Baseline: 17/28 (8/19); 13/28 (9/5) Goal status: NOT MET     LONG TERM GOALS: Target date: 08/16/2024   Pt will be independent with final HEP for improved strength, balance, transfers and gait. Baseline:  Goal status: INITIAL  2.  Pt will improve gait velocity to at least 3.1 ft/sec for improved gait efficiency and performance at SBA level  Baseline: 3.05 ft/sec CGA (8/12); 2.32 ft/s no AD (9/2), 2.8 ft/sec no AD (9/18) Goal status: IN PROGRESS  3.  Pt will improve FGA to 20/30 for decreased fall risk  Baseline: 12/30 (8/12); 16/30 (9/2) Goal status: INITIAL  4.  Pt will improve MiniBest to 19/28 for decreased fall risk and improvement with compensatory stepping strategies.  Baseline: 17/28 (8/19); 13/28 (9/5) Goal status: REVISED     ASSESSMENT:  CLINICAL IMPRESSION: Emphasis of skilled PT session on assessing one goal for 10th visit PN, working on dynamic balance and stepping, and working on functional strengthening. Pt exhibits improved gait speed as compared to last assessment earlier this month, not quite back to his speed at initial evaluation. He continues to need encouragement to push himself and challenge himself in therapy and with performing his HEP. Updated patient and his wife that he will d/c at end of POC after his last 2 scheduled visits with plan to continue with his HEP  at home and that he can return to therapy in a few months if needed. He continues to benefit from skilled  PT services in order to improve his strength, endurance, and balance in order to decrease his fall risk. Continue POC.   OBJECTIVE IMPAIRMENTS: Abnormal gait, decreased balance, decreased mobility, decreased ROM, decreased strength, and impaired perceived functional ability.   ACTIVITY LIMITATIONS: carrying, lifting, bending, standing, squatting, stairs, and bed mobility  PARTICIPATION LIMITATIONS: meal prep, cleaning, laundry, driving, shopping, community activity, and yard work  PERSONAL FACTORS: Age, Time since onset of injury/illness/exacerbation, and 3+ comorbidities:   anemia, anxiety, cervical spinal stenosis, depression, HLD, HTN, stroke (2021)are also affecting patient's functional outcome.   REHAB POTENTIAL: Fair time since onset, motivation level  CLINICAL DECISION MAKING: Evolving/moderate complexity  EVALUATION COMPLEXITY: Moderate  PLAN:  PT FREQUENCY: 1-2x/week  PT DURATION: 6 weeks  PLANNED INTERVENTIONS: 97110-Therapeutic exercises, 97530- Therapeutic activity, 97112- Neuromuscular re-education, 97535- Self Care, and [id number]- Manual therapy  PLAN FOR NEXT SESSION:  add to HEP to work on functional strengthening and to address gait and balance impairments (tends to catch his feet when walking, head turns, backwards, tandem- pt has poor retention, likely need to have wife present for this), tends to lose his balance posteriorly, stepping strategy, cog dual tasking, work on facilitation of ankle/hip strategy, blaze pods, high amplitude movement, resistance training   Waddell Southgate, PT Waddell Southgate, PT, DPT, CSRS  08/08/2024, 2:01 PM

## 2024-08-09 ENCOUNTER — Ambulatory Visit: Admitting: Physical Therapy

## 2024-08-12 DIAGNOSIS — H4322 Crystalline deposits in vitreous body, left eye: Secondary | ICD-10-CM | POA: Diagnosis not present

## 2024-08-12 DIAGNOSIS — H353122 Nonexudative age-related macular degeneration, left eye, intermediate dry stage: Secondary | ICD-10-CM | POA: Diagnosis not present

## 2024-08-12 DIAGNOSIS — H353112 Nonexudative age-related macular degeneration, right eye, intermediate dry stage: Secondary | ICD-10-CM | POA: Diagnosis not present

## 2024-08-12 DIAGNOSIS — H35352 Cystoid macular degeneration, left eye: Secondary | ICD-10-CM | POA: Diagnosis not present

## 2024-08-12 DIAGNOSIS — H34832 Tributary (branch) retinal vein occlusion, left eye, with macular edema: Secondary | ICD-10-CM | POA: Diagnosis not present

## 2024-08-12 DIAGNOSIS — H43822 Vitreomacular adhesion, left eye: Secondary | ICD-10-CM | POA: Diagnosis not present

## 2024-08-12 DIAGNOSIS — H348112 Central retinal vein occlusion, right eye, stable: Secondary | ICD-10-CM | POA: Diagnosis not present

## 2024-08-13 ENCOUNTER — Ambulatory Visit: Admitting: Physical Therapy

## 2024-08-13 VITALS — BP 131/63 | HR 71

## 2024-08-13 DIAGNOSIS — M6281 Muscle weakness (generalized): Secondary | ICD-10-CM

## 2024-08-13 DIAGNOSIS — R2681 Unsteadiness on feet: Secondary | ICD-10-CM | POA: Diagnosis not present

## 2024-08-13 DIAGNOSIS — R2689 Other abnormalities of gait and mobility: Secondary | ICD-10-CM | POA: Diagnosis not present

## 2024-08-13 NOTE — Therapy (Signed)
 OUTPATIENT PHYSICAL THERAPY NEURO TREATMENT   Patient Name: Devin Valdez MRN: [id number] DOB:03-May-1941, 83 y.o., male Today's Date: 08/13/2024   PCP: [name], MD REFERRING PROVIDER: [name], MD   END OF SESSION:  PT End of Session - 08/13/24 1233     Visit Number 11    Number of Visits 13   with eval   Date for Recertification  08/27/24    Authorization Type Medicare    PT Start Time 1231    PT Stop Time 1316    PT Time Calculation (min) 45 min    Equipment Utilized During Treatment Gait belt    Activity Tolerance Patient tolerated treatment well    Behavior During Therapy [facility] for tasks assessed/performed;Flat affect                    Past Medical History:  Diagnosis Date   Anemia    Anxiety    Cervical stenosis of spinal canal    mri 8/19- severe   Depression    Falls    Hallucinations    Hyperlipidemia    Hypertension    Stroke (HCC)    left paracentral pons infarct   Past Surgical History:  Procedure Laterality Date   Carotid Dopplers  12/2019   CVA evaluation: Bilateral ICA <40%.  Bilateral vertebral arteries with normal flow.  Bilateral subclavian arteries are normal flow.   EYE SURGERY     HERNIA REPAIR     POSTERIOR CERVICAL LAMINECTOMY     with fusion C3-C7 10/19- Dr. Bufford at [facility] ECHOCARDIOGRAM  11/2019   (CVA evaluation) EF 55 to 60%.  GR 1 DD.  No RWMA.  Normal atrial sizes.  Mild aortic valve sclerosis otherwise normal valves.   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 05/30/2022   Near syncope 05/11/2022   Intermediate stage nonexudative age-related macular degeneration of right eye 12/30/2020   Paresthesia of both hands 08/25/2020   Cervical myelopathy (HCC) 08/25/2020   Left pontine stroke (HCC) 08/25/2020   Intermediate stage nonexudative age-related macular  degeneration of left eye 06/17/2020   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 04/08/2020   Vitreomacular adhesion of left eye 04/08/2020   Cystoid macular edema of left eye 04/08/2020   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HCC) 01/13/2020   Acute right-sided weakness 01/13/2020   Difficulty urinating 01/13/2020   Stroke (HCC)    CVA (cerebral vascular accident) (HCC) 12/20/2019   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HCC)    PVC (premature ventricular contraction)    Cervical stenosis of spinal canal    Synovitis of hand 09/12/2018   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/25/2018   Failure to thrive in adult 05/26/2017   Failure to thrive in pediatric patient 05/25/2017   Abdominal pulsatile mass 05/25/2017   Anemia 05/25/2017   Essential hypertension 05/25/2017   Gait instability 05/20/2017   Frequent falls 05/20/2017   Depression 05/20/2017    ONSET DATE: 06/14/2024 (referral date)  REFERRING DIAG: M62.81 (ICD-10-CM) - Muscle weakness R53.83 (ICD-10-CM) - Fatigue, unspecified type R26.81 (ICD-10-CM) - Gait instability R29.6 (ICD-10-CM) - Frequent falls  THERAPY DIAG:  Muscle weakness (generalized)  Other abnormalities of gait and mobility  Unsteadiness on feet  Rationale for Evaluation and Treatment: Rehabilitation  SUBJECTIVE:  SUBJECTIVE STATEMENT: [name]  Pt presents alone, wife stayed in lobby. Pt reports his HEP is still challenging but he is doing it. No falls.   Pt accompanied by: significant other [name] (in lobby)  PERTINENT HISTORY: PMH: anemia, anxiety, cervical spinal stenosis, depression, HLD, HTN, stroke (2021)  Per PCP note 06/07/2024: Patient has a slow shuffling gait.  He has had this ever since his stroke although it seems slightly worse today.  This makes me concerned  about possible parkinsonism.  Patient had the same presentation in December.  He minimizes symptoms and wife is obviously concerned.  She states that he just sits and watches TV all day.  He denies depression or anhedonia.  He denies memory loss and she agrees.  There is no tremor but there is some bradykinesia and postural instability.  I will check a CK level to evaluate for statin this myopathy along with a sedimentation rate.  Rarely with statin.  If labs are normal, I would recommend a neurology consultation for parkinsonian features as well as physical therapy for muscle weakness which I believe is primarily due to deconditioning.   PAIN:  Are you having pain? No  PRECAUTIONS: Fall  RED FLAGS: None   WEIGHT BEARING RESTRICTIONS: No  FALLS: Has patient fallen in last 6 months? No  LIVING ENVIRONMENT: Lives with: lives with their spouse Lives in: House/apartment Stairs: Yes: Internal: 7-10 steps; on right going up and External: 5-6 steps; on right going up Has following equipment at home: Single point cane and Walker - 2 wheeled - doesn't use  PLOF: Independent with gait and Independent with transfers  PATIENT GOALS: get me back to how I was  OBJECTIVE:  Note: Objective measures were completed at Evaluation unless otherwise noted.  DIAGNOSTIC FINDINGS: None updated or relevant to this POC  COGNITION: Overall cognitive status: Within functional limits for tasks assessed   SENSATION: Some N/T in hands, none in legs Light touch WFL in BLE  POSTURE: rounded shoulders, forward head, and posterior pelvic tilt  LOWER EXTREMITY ROM:     Active  Right Eval Left Eval  Hip flexion    Hip extension    Hip abduction    Hip adduction    Hip internal rotation    Hip external rotation    Knee flexion    Knee extension Tight HS Tight HS  Ankle dorsiflexion Tight gastroc Tight gastroc  Ankle plantarflexion    Ankle inversion    Ankle eversion     (Blank rows = not  tested)  LOWER EXTREMITY MMT:    MMT Right Eval Left Eval  Hip flexion 4 4  Hip extension    Hip abduction    Hip adduction    Hip internal rotation    Hip external rotation    Knee flexion 4 4  Knee extension 4 4  Ankle dorsiflexion 5 5  Ankle plantarflexion    Ankle inversion    Ankle eversion    (Blank rows = not tested)  BED MOBILITY:  Has a little bit of trouble getting in/out of bed per patient report  TRANSFERS: Sit to stand: Modified independence  Assistive device utilized: None     Stand to sit: Modified independence  Assistive device utilized: None     Chair to chair: Modified independence  Assistive device utilized: None       RAMP:  Not tested  CURB:  Not tested  STAIRS:  STAIRS:  Level of Assistance: CGA  Psychologist, counselling  Technique: Alternating Pattern  with Bilateral Rails  Number of Stairs: 6   Height of Stairs: 4  Comments: WFL  GAIT: Findings:  Gait pattern: decreased arm swing- Right, decreased arm swing- Left, decreased hip/knee flexion- Right, decreased hip/knee flexion- Left, decreased ankle dorsiflexion- Right, decreased ankle dorsiflexion- Left, poor foot clearance- Right, and poor foot clearance- Left Distance walked: various clinic distances Assistive device utilized: None Level of assistance: SBA Comments: Pt w/single instance of catching R foot today that he was able to correct independently    FUNCTIONAL TESTS:    [facility] PT Assessment - 08/13/24 1236       Mini-BESTest   Sit To Stand Normal: Comes to stand without use of hands and stabilizes independently.    Rise to Toes Normal: Stable for 3 s with maximum height.    Stand on one leg (left) Moderate: < 20 s   12.81   Stand on one leg (right) Moderate: < 20 s   4.45   Stand on one leg - lowest score 1    Compensatory Stepping Correction - Forward No step, OR would fall if not caught, OR falls spontaneously.   pt refusing to attempt   Compensatory Stepping Correction -  Backward No step, OR would fall if not caught, OR falls spontaneously.   pt refusing to attempt   Compensatory Stepping Correction - Left Lateral Severe: Falls, or cannot step   pt refusing to attempt   Compensatory Stepping Correction - Right Lateral Severe:  Falls, or cannot step   pt refusing to attempt   Stepping Corredtion Lateral - lowest score 0    Stance - Feet together, eyes open, firm surface  Normal: 30s    Stance - Feet together, eyes closed, foam surface  Normal: 30s    Incline - Eyes Closed Normal: Stands independently 30s and aligns with gravity    Change in Gait Speed Moderate: Unable to change walking speed or signs of imbalance   No change in speed   Walk with head turns - Horizontal Normal: performs head turns with no change in gait speed and good balance    Walk with pivot turns Moderate:Turns with feet close SLOW (>4 steps) with good balance.    Step over obstacles Normal: Able to step over box with minimal change of gait speed and with good balance.    Timed UP & GO with Dual Task Severe: Stops counting while walking OR stops walking while counting.    Mini-BEST total score 17      Timed Up and Go Test   Normal TUG (seconds) 13.41    Cognitive TUG (seconds) 29.97   Retro counitng by 1, max cues required to turn around and walk back to chair despite multiple attempts   TUG Comments No AD, high fall risk             VITALS  Vitals:   08/13/24 1235  BP: 131/63  Pulse: 71  TREATMENT DATE:    Self-Care/Home Management: Assessed in LUE in sitting prior to intervention (see above) and within safe limits this date. Discussed importance of continuing fitness post-DC to maintain gains made in PT and continue to improve strength and aerobic capacity. Pt reports his wife goes to the senior center but he does not want to go with her. Informed  wife, [name], at end of session of therapist's recommendation and wife reports she has been encouraging pt to go with her for a long time and pt will not.   Physical Performance   [facility] PT Assessment - 08/13/24 1236       Mini-BESTest   Sit To Stand Normal: Comes to stand without use of hands and stabilizes independently.    Rise to Toes Normal: Stable for 3 s with maximum height.    Stand on one leg (left) Moderate: < 20 s   12.81   Stand on one leg (right) Moderate: < 20 s   4.45   Stand on one leg - lowest score 1    Compensatory Stepping Correction - Forward No step, OR would fall if not caught, OR falls spontaneously.   pt refusing to attempt   Compensatory Stepping Correction - Backward No step, OR would fall if not caught, OR falls spontaneously.   pt refusing to attempt   Compensatory Stepping Correction - Left Lateral Severe: Falls, or cannot step   pt refusing to attempt   Compensatory Stepping Correction - Right Lateral Severe:  Falls, or cannot step   pt refusing to attempt   Stepping Corredtion Lateral - lowest score 0    Stance - Feet together, eyes open, firm surface  Normal: 30s    Stance - Feet together, eyes closed, foam surface  Normal: 30s    Incline - Eyes Closed Normal: Stands independently 30s and aligns with gravity    Change in Gait Speed Moderate: Unable to change walking speed or signs of imbalance   No change in speed   Walk with head turns - Horizontal Normal: performs head turns with no change in gait speed and good balance    Walk with pivot turns Moderate:Turns with feet close SLOW (>4 steps) with good balance.    Step over obstacles Normal: Able to step over box with minimal change of gait speed and with good balance.    Timed UP & GO with Dual Task Severe: Stops counting while walking OR stops walking while counting.    Mini-BEST total score 17      Timed Up and Go Test   Normal TUG (seconds) 13.41    Cognitive TUG (seconds) 29.97   Retro counitng by 1, max  cues required to turn around and walk back to chair despite multiple attempts   TUG Comments No AD, high fall risk         6 Blaze pods on random reach setting for improved single leg stability, LE coordination and reactive stepping.  Performed on 2 minute intervals with 2 minute rest periods.  Pt requires CGA guarding. Round 1:  3 pods on first step and 3 pods on second step setup.  44 hits w/no UE support. Round 2:  same setup.  45 hits. Notable errors/deficits:  Mod cues to tap pods w/LLE as pt prefers to only use RLE. Pt frequently catching left foot on edge of step     PATIENT EDUCATION: Education details: Continue HEP, results of goals, encouragement to join wife at senior center to maintain  fitness post-DC  Person educated: Patient, [name] Education method: Medical illustrator Education comprehension: verbalized understanding, returned demonstration, verbal cues required, and needs further education  HOME EXERCISE PROGRAM: Access Code: [email or web address] URL: [email or web address] Date: 07/09/2024 Prepared by: Waddell Southgate  Exercises - Wide Tandem Stance with Eyes Open  - 1 x daily - 7 x weekly - 1 sets - 3-5 reps - 30 sec hold - Staggered Sit-to-Stand  - 1 x daily - 7 x weekly - 1-2 sets - 10 reps - Tandem Walking with Counter Support  - 1 x daily - 7 x weekly - 3 sets - 10 reps  GOALS: Goals reviewed with patient? Yes  SHORT TERM GOALS: Target date: 07/23/2024   Pt will be independent with initial HEP for improved strength, balance, transfers and gait. Baseline: Goal status: MET  2.  Pt will improve gait velocity to at least 3.25 ft/sec for improved gait efficiency and performance at SBA level  Baseline: 3.05 ft/sec CGA (8/12); 2.32 ft/s no AD (9/2), 2.8 ft/sec no AD (9/18) Goal status: NOT MET   3.  Pt will improve FGA to 16/30 for decreased fall risk  Baseline: 12/30 (8/12); 16/30 (9/2) Goal status: MET  4.  Pt will improve MiniBest to 19/28 for  decreased fall risk and improvement with compensatory stepping strategies.  Baseline: 17/28 (8/19); 13/28 (9/5) Goal status: NOT MET     LONG TERM GOALS: Target date: 08/16/2024   Pt will be independent with final HEP for improved strength, balance, transfers and gait. Baseline:  Goal status: INITIAL  2.  Pt will improve gait velocity to at least 3.1 ft/sec for improved gait efficiency and performance at SBA level  Baseline: 3.05 ft/sec CGA (8/12); 2.32 ft/s no AD (9/2), 2.8 ft/sec no AD (9/18) Goal status: IN PROGRESS  3.  Pt will improve FGA to 20/30 for decreased fall risk  Baseline: 12/30 (8/12); 16/30 (9/2) Goal status: INITIAL  4.  Pt will improve MiniBest to 19/28 for decreased fall risk and improvement with compensatory stepping strategies.  Baseline: 17/28 (8/19); 13/28 (9/5); 17/28 (9/23) Goal status: NOT MET     ASSESSMENT:  CLINICAL IMPRESSION: Emphasis of skilled PT session on initiating LTG assessment, LE coordination and reactive stepping. Pt did not meet his MiniBest goal, scoring a 17/28 today which is the same score from eval. Pt refused to perform stepping strategy component of MiniBest today despite max encouragement from therapist. Pt strongly encouraged to join his wife at senior center post-DC to continue improving strength and stability, but pt refusing this date. Continue POC.   OBJECTIVE IMPAIRMENTS: Abnormal gait, decreased balance, decreased mobility, decreased ROM, decreased strength, and impaired perceived functional ability.   ACTIVITY LIMITATIONS: carrying, lifting, bending, standing, squatting, stairs, and bed mobility  PARTICIPATION LIMITATIONS: meal prep, cleaning, laundry, driving, shopping, community activity, and yard work  PERSONAL FACTORS: Age, Time since onset of injury/illness/exacerbation, and 3+ comorbidities:   anemia, anxiety, cervical spinal stenosis, depression, HLD, HTN, stroke (2021)are also affecting patient's functional  outcome.   REHAB POTENTIAL: Fair time since onset, motivation level  CLINICAL DECISION MAKING: Evolving/moderate complexity  EVALUATION COMPLEXITY: Moderate  PLAN:  PT FREQUENCY: 1-2x/week  PT DURATION: 6 weeks  PLANNED INTERVENTIONS: 97110-Therapeutic exercises, 97530- Therapeutic activity, [id number]- Neuromuscular re-education, 97535- Self Care, and [id number]- Manual therapy  PLAN FOR NEXT SESSION: Finish Goals and DC. Continue to encourage pt to go to senior center post-DC   Santita Hunsberger E Zoye Chandra, PT, DPT  08/13/2024, 1:24 PM

## 2024-08-15 ENCOUNTER — Ambulatory Visit: Admitting: Physical Therapy

## 2024-08-15 VITALS — BP 132/58 | HR 68

## 2024-08-15 DIAGNOSIS — R2681 Unsteadiness on feet: Secondary | ICD-10-CM

## 2024-08-15 DIAGNOSIS — R2689 Other abnormalities of gait and mobility: Secondary | ICD-10-CM | POA: Diagnosis not present

## 2024-08-15 DIAGNOSIS — M6281 Muscle weakness (generalized): Secondary | ICD-10-CM | POA: Diagnosis not present

## 2024-08-15 NOTE — Therapy (Signed)
 OUTPATIENT PHYSICAL THERAPY NEURO TREATMENT - DISCHARGE NOTE   Patient Name: Devin Valdez MRN: [id number] DOB:[birth year], 83 y.o., male Today's Date: 08/15/2024   PCP: [name], MD REFERRING PROVIDER: [name], MD  PHYSICAL THERAPY DISCHARGE SUMMARY  Visits from Start of Care: 12  Current functional level related to goals / functional outcomes: Supervision to mod I level   Remaining deficits: Cognitive impairments, balance impairments   Education / Equipment: Handout for final HEP   Patient agrees to discharge. Patient goals were partially met. Patient is being discharged due to maximized rehab potential.     END OF SESSION:  PT End of Session - 08/15/24 1314     Visit Number 12    Number of Visits 13   with eval   Date for Recertification  08/27/24    Authorization Type Medicare    PT Start Time 1314    PT Stop Time 1338   d/c   PT Time Calculation (min) 24 min    Equipment Utilized During Treatment Gait belt    Activity Tolerance Patient tolerated treatment well    Behavior During Therapy WFL for tasks assessed/performed;Flat affect                     Past Medical History:  Diagnosis Date   Anemia    Anxiety    Cervical stenosis of spinal canal    mri 8/19- severe   Depression    Falls    Hallucinations    Hyperlipidemia    Hypertension    Stroke (HCC)    left paracentral pons infarct   Past Surgical History:  Procedure Laterality Date   Carotid Dopplers  12/2019   CVA evaluation: Bilateral ICA <40%.  Bilateral vertebral arteries with normal flow.  Bilateral subclavian arteries are normal flow.   EYE SURGERY     HERNIA REPAIR     POSTERIOR CERVICAL LAMINECTOMY     with fusion C3-C7 10/19- Dr. Bufford at [facility] ECHOCARDIOGRAM  11/2019   (CVA evaluation) EF 55 to 60%.  GR 1 DD.  No RWMA.  Normal atrial sizes.  Mild aortic valve sclerosis otherwise normal valves.   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 05/30/2022   Near syncope 05/11/2022   Intermediate stage nonexudative age-related macular degeneration of right eye 12/30/2020   Paresthesia of both hands 08/25/2020   Cervical myelopathy (HCC) 08/25/2020   Left pontine stroke (HCC) 08/25/2020   Intermediate stage nonexudative age-related macular degeneration of left eye 06/17/2020   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 04/08/2020   Vitreomacular adhesion of left eye 04/08/2020   Cystoid macular edema of left eye 04/08/2020   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HCC) 01/13/2020   Acute right-sided weakness 01/13/2020   Difficulty urinating 01/13/2020   Stroke (HCC)    CVA (cerebral vascular accident) (HCC) 12/20/2019   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HCC)    PVC (premature ventricular contraction)    Cervical stenosis of spinal canal    Synovitis of hand 09/12/2018   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/25/2018   Failure to thrive in adult 05/26/2017   Failure to thrive in pediatric patient 05/25/2017   Abdominal pulsatile mass 05/25/2017   Anemia 05/25/2017   Essential hypertension 05/25/2017   Gait instability 05/20/2017   Frequent falls 05/20/2017  Depression 05/20/2017    ONSET DATE: 06/14/2024 (referral date)  REFERRING DIAG: M62.81 (ICD-10-CM) - Muscle weakness R53.83 (ICD-10-CM) - Fatigue, unspecified type R26.81 (ICD-10-CM) - Gait instability R29.6 (ICD-10-CM) - Frequent falls  THERAPY DIAG:  Muscle weakness (generalized)  Other abnormalities of gait and mobility  Unsteadiness on feet  Rationale for Evaluation and Treatment: Rehabilitation  SUBJECTIVE:                                                                                                                                                                                             SUBJECTIVE STATEMENT: Devin Valdez  Pt  presents alone, wife stayed in lobby. Pt reports his HEP is still challenging, no questions over it. No falls, denies any pain.    Pt accompanied by: significant other [name] (in lobby)  PERTINENT HISTORY: PMH: anemia, anxiety, cervical spinal stenosis, depression, HLD, HTN, stroke (2021)  Per PCP note 06/07/2024: Patient has a slow shuffling gait.  He has had this ever since his stroke although it seems slightly worse today.  This makes me concerned about possible parkinsonism.  Patient had the same presentation in December.  He minimizes symptoms and wife is obviously concerned.  She states that he just sits and watches TV all day.  He denies depression or anhedonia.  He denies memory loss and she agrees.  There is no tremor but there is some bradykinesia and postural instability.  I will check a CK level to evaluate for statin this myopathy along with a sedimentation rate.  Rarely with statin.  If labs are normal, I would recommend a neurology consultation for parkinsonian features as well as physical therapy for muscle weakness which I believe is primarily due to deconditioning.   PAIN:  Are you having pain? No  PRECAUTIONS: Fall  RED FLAGS: None   WEIGHT BEARING RESTRICTIONS: No  FALLS: Has patient fallen in last 6 months? No  LIVING ENVIRONMENT: Lives with: lives with their spouse Lives in: House/apartment Stairs: Yes: Internal: 7-10 steps; on right going up and External: 5-6 steps; on right going up Has following equipment at home: Single point cane and Walker - 2 wheeled - doesn't use  PLOF: Independent with gait and Independent with transfers  PATIENT GOALS: get me back to how I was  OBJECTIVE:  Note: Objective measures were completed at Evaluation unless otherwise noted.  DIAGNOSTIC FINDINGS: None updated or relevant to this POC  COGNITION: Overall cognitive status: Within functional limits for tasks assessed   SENSATION: Some N/T in hands, none in legs Light  touch WFL in BLE  POSTURE: rounded shoulders, forward head, and posterior pelvic  tilt  LOWER EXTREMITY ROM:     Active  Right Eval Left Eval  Hip flexion    Hip extension    Hip abduction    Hip adduction    Hip internal rotation    Hip external rotation    Knee flexion    Knee extension Tight HS Tight HS  Ankle dorsiflexion Tight gastroc Tight gastroc  Ankle plantarflexion    Ankle inversion    Ankle eversion     (Blank rows = not tested)  LOWER EXTREMITY MMT:    MMT Right Eval Left Eval  Hip flexion 4 4  Hip extension    Hip abduction    Hip adduction    Hip internal rotation    Hip external rotation    Knee flexion 4 4  Knee extension 4 4  Ankle dorsiflexion 5 5  Ankle plantarflexion    Ankle inversion    Ankle eversion    (Blank rows = not tested)  BED MOBILITY:  Has a little bit of trouble getting in/out of bed per patient report  TRANSFERS: Sit to stand: Modified independence  Assistive device utilized: None     Stand to sit: Modified independence  Assistive device utilized: None     Chair to chair: Modified independence  Assistive device utilized: None       RAMP:  Not tested  CURB:  Not tested  STAIRS:  STAIRS:  Level of Assistance: CGA  Stair Negotiation Technique: Alternating Pattern  with Bilateral Rails  Number of Stairs: 6   Height of Stairs: 4  Comments: WFL  GAIT: Findings:  Gait pattern: decreased arm swing- Right, decreased arm swing- Left, decreased hip/knee flexion- Right, decreased hip/knee flexion- Left, decreased ankle dorsiflexion- Right, decreased ankle dorsiflexion- Left, poor foot clearance- Right, and poor foot clearance- Left Distance walked: various clinic distances Assistive device utilized: None Level of assistance: SBA Comments: Pt w/single instance of catching R foot today that he was able to correct independently    FUNCTIONAL TESTS:    [facility] PT Assessment - 08/15/24 1321       Ambulation/Gait   Gait  velocity 32.8 ft over 11.5 sec = 2.85 ft/sec      Functional Gait  Assessment   Gait assessed  Yes    Gait Level Surface Walks 20 ft, slow speed, abnormal gait pattern, evidence for imbalance or deviates 10-15 in outside of the 12 in walkway width. Requires more than 7 sec to ambulate 20 ft.    Change in Gait Speed Able to change speed, demonstrates mild gait deviations, deviates 6-10 in outside of the 12 in walkway width, or no gait deviations, unable to achieve a major change in velocity, or uses a change in velocity, or uses an assistive device.    Gait with Horizontal Head Turns Performs head turns smoothly with slight change in gait velocity (eg, minor disruption to smooth gait path), deviates 6-10 in outside 12 in walkway width, or uses an assistive device.    Gait with Vertical Head Turns Performs task with slight change in gait velocity (eg, minor disruption to smooth gait path), deviates 6 - 10 in outside 12 in walkway width or uses assistive device    Gait and Pivot Turn Turns slowly, requires verbal cueing, or requires several small steps to catch balance following turn and stop    Step Over Obstacle Is able to step over one shoe box (4.5 in total height) without changing gait speed. No evidence of imbalance.  Gait with Narrow Base of Support Is able to ambulate for 10 steps heel to toe with no staggering.    Gait with Eyes Closed Walks 20 ft, slow speed, abnormal gait pattern, evidence for imbalance, deviates 10-15 in outside 12 in walkway width. Requires more than 9 sec to ambulate 20 ft.   10 sec   Ambulating Backwards Walks 20 ft, uses assistive device, slower speed, mild gait deviations, deviates 6-10 in outside 12 in walkway width.    Steps Alternating feet, must use rail.    Total Score 18    FGA comment: 18/30, high fall risk              VITALS  Vitals:   08/15/24 1317  BP: (!) 132/58  Pulse: 68                                                                                                                                     TREATMENT DATE:    Self-Care/Home Management: Assessed in LUE in sitting prior to intervention (see above) and within safe limits this date. Discussed importance of continuing fitness post-DC to maintain gains made in PT and continue to improve strength and aerobic capacity. Pt reports his wife goes to the senior center and today he is interested in going with her. Discussed with his wife, [name], at end of session of therapist's recommendation and that today he reports he is willing to go with her.   Physical Performance  For LTG assessment:  [facility] PT Assessment - 08/15/24 1321       Ambulation/Gait   Gait velocity 32.8 ft over 11.5 sec = 2.85 ft/sec      Functional Gait  Assessment   Gait assessed  Yes    Gait Level Surface Walks 20 ft, slow speed, abnormal gait pattern, evidence for imbalance or deviates 10-15 in outside of the 12 in walkway width. Requires more than 7 sec to ambulate 20 ft.    Change in Gait Speed Able to change speed, demonstrates mild gait deviations, deviates 6-10 in outside of the 12 in walkway width, or no gait deviations, unable to achieve a major change in velocity, or uses a change in velocity, or uses an assistive device.    Gait with Horizontal Head Turns Performs head turns smoothly with slight change in gait velocity (eg, minor disruption to smooth gait path), deviates 6-10 in outside 12 in walkway width, or uses an assistive device.    Gait with Vertical Head Turns Performs task with slight change in gait velocity (eg, minor disruption to smooth gait path), deviates 6 - 10 in outside 12 in walkway width or uses assistive device    Gait and Pivot Turn Turns slowly, requires verbal cueing, or requires several small steps to catch balance following turn and stop    Step Over Obstacle Is able to step over one shoe box (4.5 in total height)  without changing gait speed. No evidence of imbalance.    Gait  with Narrow Base of Support Is able to ambulate for 10 steps heel to toe with no staggering.    Gait with Eyes Closed Walks 20 ft, slow speed, abnormal gait pattern, evidence for imbalance, deviates 10-15 in outside 12 in walkway width. Requires more than 9 sec to ambulate 20 ft.   10 sec   Ambulating Backwards Walks 20 ft, uses assistive device, slower speed, mild gait deviations, deviates 6-10 in outside 12 in walkway width.    Steps Alternating feet, must use rail.    Total Score 18    FGA comment: 18/30, high fall risk             PATIENT EDUCATION: Education details: Continue HEP, results of goals, encouragement to join wife at senior center to maintain fitness post-DC  Person educated: Patient, [name] Education method: Medical illustrator Education comprehension: verbalized understanding, returned demonstration, and verbal cues required  HOME EXERCISE PROGRAM: Access Code: [email or web address] URL: [email or web address] Date: 07/09/2024 Prepared by: Waddell Southgate  Exercises - Wide Tandem Stance with Eyes Open  - 1 x daily - 7 x weekly - 1 sets - 3-5 reps - 30 sec hold - Staggered Sit-to-Stand  - 1 x daily - 7 x weekly - 1-2 sets - 10 reps - Tandem Walking with Counter Support  - 1 x daily - 7 x weekly - 3 sets - 10 reps   GOALS: Goals reviewed with patient? Yes  SHORT TERM GOALS: Target date: 07/23/2024   Pt will be independent with initial HEP for improved strength, balance, transfers and gait. Baseline: Goal status: MET  2.  Pt will improve gait velocity to at least 3.25 ft/sec for improved gait efficiency and performance at SBA level  Baseline: 3.05 ft/sec CGA (8/12); 2.32 ft/s no AD (9/2), 2.8 ft/sec no AD (9/18) Goal status: NOT MET   3.  Pt will improve FGA to 16/30 for decreased fall risk  Baseline: 12/30 (8/12); 16/30 (9/2) Goal status: MET  4.  Pt will improve MiniBest to 19/28 for decreased fall risk and improvement with compensatory stepping  strategies.  Baseline: 17/28 (8/19); 13/28 (9/5) Goal status: NOT MET     LONG TERM GOALS: Target date: 08/16/2024   Pt will be independent with final HEP for improved strength, balance, transfers and gait. Baseline:  Goal status:  MET  2.  Pt will improve gait velocity to at least 3.1 ft/sec for improved gait efficiency and performance at SBA level  Baseline: 3.05 ft/sec CGA (8/12); 2.32 ft/s no AD (9/2), 2.8 ft/sec no AD (9/18), 2.85 ft/sec no AD (9/25) Goal status: NOT MET  3.  Pt will improve FGA to 20/30 for decreased fall risk  Baseline: 12/30 (8/12); 16/30 (9/2), 18/30 (9/25) Goal status: NOT MET  4.  Pt will improve MiniBest to 19/28 for decreased fall risk and improvement with compensatory stepping strategies.  Baseline: 17/28 (8/19); 13/28 (9/5); 17/28 (9/23) Goal status: NOT MET     ASSESSMENT:  CLINICAL IMPRESSION: Emphasis of skilled PT session on assessing remaining LTG with planned d/c from OPPT services this date. Pt has met 1/3 LTG assessed this date due to being independent with his final HEP. He did improve his gait speed as compared to last assessment, improved his FGA score, and improved his miniBEST score as compared to last assessment but did not quite meet his goal level for these. He is agreeable to d/c at this time  as he has maximized his rehab potential. Plan to have him continue with his HEP and join senior community center for fitness.   OBJECTIVE IMPAIRMENTS: Abnormal gait, decreased balance, decreased mobility, decreased ROM, decreased strength, and impaired perceived functional ability.   ACTIVITY LIMITATIONS: carrying, lifting, bending, standing, squatting, stairs, and bed mobility  PARTICIPATION LIMITATIONS: meal prep, cleaning, laundry, driving, shopping, community activity, and yard work  PERSONAL FACTORS: Age, Time since onset of injury/illness/exacerbation, and 3+ comorbidities:   anemia, anxiety, cervical spinal stenosis, depression, HLD,  HTN, stroke (2021)are also affecting patient's functional outcome.   REHAB POTENTIAL: Fair time since onset, motivation level  CLINICAL DECISION MAKING: Evolving/moderate complexity  EVALUATION COMPLEXITY: Moderate  PLAN: discharge from PT   Waddell Southgate, PT Waddell Southgate, PT, DPT, CSRS   08/15/2024, 1:39 PM

## 2024-08-16 ENCOUNTER — Ambulatory Visit: Admitting: Physical Therapy

## 2024-08-31 DIAGNOSIS — R1111 Vomiting without nausea: Secondary | ICD-10-CM | POA: Diagnosis not present

## 2024-08-31 DIAGNOSIS — R531 Weakness: Secondary | ICD-10-CM | POA: Diagnosis not present

## 2024-09-02 ENCOUNTER — Other Ambulatory Visit: Payer: Self-pay | Admitting: Family Medicine

## 2024-09-23 DIAGNOSIS — H353122 Nonexudative age-related macular degeneration, left eye, intermediate dry stage: Secondary | ICD-10-CM | POA: Diagnosis not present

## 2024-09-23 DIAGNOSIS — H348112 Central retinal vein occlusion, right eye, stable: Secondary | ICD-10-CM | POA: Diagnosis not present

## 2024-09-23 DIAGNOSIS — H353112 Nonexudative age-related macular degeneration, right eye, intermediate dry stage: Secondary | ICD-10-CM | POA: Diagnosis not present

## 2024-09-23 DIAGNOSIS — H35352 Cystoid macular degeneration, left eye: Secondary | ICD-10-CM | POA: Diagnosis not present

## 2024-09-23 DIAGNOSIS — H34832 Tributary (branch) retinal vein occlusion, left eye, with macular edema: Secondary | ICD-10-CM | POA: Diagnosis not present

## 2024-09-23 DIAGNOSIS — H4322 Crystalline deposits in vitreous body, left eye: Secondary | ICD-10-CM | POA: Diagnosis not present

## 2024-09-23 DIAGNOSIS — H43822 Vitreomacular adhesion, left eye: Secondary | ICD-10-CM | POA: Diagnosis not present

## 2024-10-04 ENCOUNTER — Telehealth: Payer: Self-pay | Admitting: Neurology

## 2024-10-04 NOTE — Telephone Encounter (Signed)
 Appointment details confirmed

## 2024-10-11 ENCOUNTER — Ambulatory Visit: Admitting: Neurology

## 2024-10-11 ENCOUNTER — Encounter: Payer: Self-pay | Admitting: Neurology

## 2024-10-11 VITALS — BP 125/79 | HR 72 | Ht 67.0 in | Wt 164.5 lb

## 2024-10-11 DIAGNOSIS — R269 Unspecified abnormalities of gait and mobility: Secondary | ICD-10-CM | POA: Diagnosis not present

## 2024-10-11 DIAGNOSIS — Z9889 Other specified postprocedural states: Secondary | ICD-10-CM | POA: Diagnosis not present

## 2024-10-11 DIAGNOSIS — I639 Cerebral infarction, unspecified: Secondary | ICD-10-CM | POA: Diagnosis not present

## 2024-10-11 NOTE — Progress Notes (Signed)
 Chief Complaint  Patient presents with   RM14/MUSCLE WEAKNESS    Pt is here with his Wife. Pt denies any falls or tremors. Pt states he has a hard time gripping things. Pt's wife states that pt has weakness in his legs as well.       ASSESSMENT AND PLAN  Devin Valdez is a 83 y.o. male   Gait abnormality   Most likely due to residual right sided weakness from left pontine stroke, and residual symptoms due to previous cervical myelopathy  No significant parkinsonian features  He would benefit from moderate exercise  Refer to home physical therapy   DIAGNOSTIC DATA (LABS, IMAGING, TESTING) - I reviewed patient records, labs, notes, testing and imaging myself where available.   MEDICAL HISTORY:  Devin Valdez is a 83 year old male, seen in request by his primary care from [facility] Dr.  Duanne, Butler evaluation of gait abnormality he is accompanied by his wife at today's visit October 11, 2024  History is obtained from the patient and review of electronic medical records. I personally reviewed pertinent available imaging films in PACS.   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 Acute/subacute infarct left pons with mild interval increase in  size since 12/20/2019 suggesting interval extension. No other areas of acute infarct.   He had mild worsening gait abnormality, hands weakness since his stroke  Had a electrodiagnostic study in October 2021, there is evidence of chronic cervical radiculopathy, no evidence of upper extremity focal neuropathy  He can still ambulate without assistant, but sedentary most of the time, lives at home with his wife, spend most time watching TV  PHYSICAL EXAM:   Vitals:   10/11/24 1011  BP: 125/79  Pulse: 72  SpO2: 98%  Weight: 164 lb 8 oz (74.6 kg)  Height: 5' 7 (1.702 m)   Body mass index is 25.76 kg/m.  PHYSICAL EXAMNIATION:  Gen: NAD, conversant, well nourised, well groomed                     Cardiovascular: Regular rate rhythm, no peripheral edema, warm, nontender. Eyes: Conjunctivae clear without exudates or hemorrhage Neck: Supple, no carotid bruits. Pulmonary: Clear to auscultation bilaterally   NEUROLOGICAL EXAM:  MENTAL STATUS: Speech/cognition: Awake, alert, oriented to history taking and casual conversation CRANIAL NERVES: CN II: Visual fields are full to confrontation. Pupils are round equal and briskly reactive to light. CN III, IV, VI: extraocular movement are normal. No ptosis. CN V: Facial sensation is intact to light touch CN VII: Face is symmetric with normal eye closure  CN VIII: Hearing is normal to causal conversation. CN IX, X: Phonation is normal. CN XI: Head turning and shoulder shrug are intact  MOTOR: Right arm pronation drift, fixation on rapid rotating movement  REFLEXES: Hyperreflexia of  right upper and lower extremity,  SENSORY: Intact to light touch, pinprick and vibratory sensation are intact in fingers and toes.  COORDINATION: There is no trunk or limb dysmetria noted.  GAIT/STANCE: Push-up stiff cautious gait, decreased right arm swing  REVIEW OF SYSTEMS:  Full 14 system review of systems performed and notable only for as  above All other review of systems were negative.   ALLERGIES: No Known Allergies  HOME MEDICATIONS: Current Outpatient Medications  Medication Sig Dispense Refill   aspirin  81 MG chewable tablet Chew by mouth daily.     atorvastatin  (LIPITOR ) 40 MG tablet TAKE ONE TABLET (40MG  TOTAL) BY MOUTH DAILY AT 6PM 90 tablet 1   Coenzyme Q10 (CO Q-10) 200 MG CAPS Take by mouth daily.     ferrous sulfate  325 (65 FE) MG tablet Take 1 tablet (325 mg total) by mouth 2 (two) times daily with a meal. 60 tablet 1   losartan  (COZAAR ) 50 MG tablet TAKE ONE TABLET BY MOUTH EVERY DAY FOR BLOOD PRESSURE 90 tablet 1   Multiple Vitamins-Minerals (CENTRUM SILVER  ADULT 50+) TABS Take 1 capsule by mouth 2 (two) times daily. 30 tablet 2   acetaminophen  (TYLENOL ) 325 MG tablet Take 650 mg by mouth every 6 (six) hours as needed for mild pain or headache.     Ascorbic Acid (VITAMIN C) 1000 MG tablet Take 1,000 mg by mouth daily. (Patient not taking: Reported on 11/17/2023)     No current facility-administered medications for this visit.    PAST MEDICAL HISTORY: Past Medical History:  Diagnosis Date   Anemia    Anxiety    Cervical stenosis of spinal canal    mri 8/19- severe   Depression    Falls    Hallucinations    Hyperlipidemia    Hypertension    Stroke (HCC)    left paracentral pons infarct    PAST SURGICAL HISTORY: Past Surgical History:  Procedure Laterality Date   Carotid Dopplers  12/2019   CVA evaluation: Bilateral ICA <40%.  Bilateral vertebral arteries with normal flow.  Bilateral subclavian arteries are normal flow.   EYE SURGERY     HERNIA REPAIR     POSTERIOR CERVICAL LAMINECTOMY     with fusion C3-C7 10/19- Dr. Bufford at [facility] ECHOCARDIOGRAM  11/2019   (CVA evaluation) EF 55 to 60%.  GR 1 DD.  No RWMA.  Normal atrial sizes.  Mild aortic valve sclerosis otherwise normal valves.    FAMILY HISTORY: Family History  Problem Relation Age of Onset   Multiple sclerosis  Son    Bipolar disorder Son     SOCIAL HISTORY: Social History   Socioeconomic History   Marital status: Married    Spouse name: [name]   Number of children: Not on file   Years of education: Not on file   Highest education level: Not on file  Occupational History   Occupation: retired  Tobacco Use   Smoking status: Former    Current packs/day: 0.00    Types: Cigarettes    Quit date: 11/21/1985    Years since quitting: 38.9   Smokeless tobacco: Never  Substance and Sexual Activity   Alcohol use: No   Drug use: No   Sexual activity: Yes  Other Topics Concern   Not on file  Social History Narrative   Not on file   Social Drivers of Health   Financial Resource Strain: Low Risk  (04/18/2024)   Overall Financial Resource Strain (CARDIA)  Difficulty of Paying Living Expenses: Not hard at all  Food Insecurity: No Food Insecurity (04/18/2024)   Hunger Vital Sign    Worried About Running Out of Food in the Last Year: Never true    Ran Out of Food in the Last Year: Never true  Transportation Needs: No Transportation Needs (04/18/2024)   PRAPARE - Administrator, Civil Service (Medical): No    Lack of Transportation (Non-Medical): No  Physical Activity: Inactive (04/18/2024)   Exercise Vital Sign    Days of Exercise per Week: 0 days    Minutes of Exercise per Session: 0 min  Stress: No Stress Concern Present (04/18/2024)   [employer] of Occupational Health - Occupational Stress Questionnaire    Feeling of Stress : Not at all  Social Connections: Socially Integrated (04/18/2024)   Social Connection and Isolation Panel    Frequency of Communication with Friends and Family: Twice a week    Frequency of Social Gatherings with Friends and Family: Three times a week    Attends Religious Services: More than 4 times per year    Active Member of Clubs or Organizations: Yes    Attends Banker Meetings: 1 to 4 times per year    Marital Status: Married   Catering Manager Violence: Not At Risk (04/18/2024)   Humiliation, Afraid, Rape, and Kick questionnaire    Fear of Current or Ex-Partner: No    Emotionally Abused: No    Physically Abused: No    Sexually Abused: No      Modena Callander, M.D. Ph.D.  [facility] [address], [address] [place], [place] [address] Ph: [phone] Fax: [phone]  CC:  [name], MD [address] [place],  [place] [address]  [name], MD

## 2024-10-14 ENCOUNTER — Telehealth: Payer: Self-pay | Admitting: Neurology

## 2024-10-14 NOTE — Telephone Encounter (Signed)
 [facility] is going to take him.

## 2024-12-04 ENCOUNTER — Other Ambulatory Visit

## 2024-12-04 DIAGNOSIS — E43 Unspecified severe protein-calorie malnutrition: Secondary | ICD-10-CM

## 2024-12-04 DIAGNOSIS — M6281 Muscle weakness (generalized): Secondary | ICD-10-CM

## 2024-12-04 DIAGNOSIS — R627 Adult failure to thrive: Secondary | ICD-10-CM

## 2024-12-04 DIAGNOSIS — Z8673 Personal history of transient ischemic attack (TIA), and cerebral infarction without residual deficits: Secondary | ICD-10-CM

## 2024-12-04 DIAGNOSIS — D509 Iron deficiency anemia, unspecified: Secondary | ICD-10-CM

## 2024-12-04 DIAGNOSIS — I10 Essential (primary) hypertension: Secondary | ICD-10-CM

## 2024-12-04 DIAGNOSIS — E7800 Pure hypercholesterolemia, unspecified: Secondary | ICD-10-CM

## 2024-12-04 DIAGNOSIS — Z6825 Body mass index (BMI) 25.0-25.9, adult: Secondary | ICD-10-CM

## 2024-12-04 DIAGNOSIS — R002 Palpitations: Secondary | ICD-10-CM

## 2024-12-04 DIAGNOSIS — R5383 Other fatigue: Secondary | ICD-10-CM

## 2024-12-04 DIAGNOSIS — E46 Unspecified protein-calorie malnutrition: Secondary | ICD-10-CM

## 2024-12-05 ENCOUNTER — Ambulatory Visit: Payer: Self-pay | Admitting: Family Medicine

## 2024-12-05 LAB — CBC WITH DIFFERENTIAL/PLATELET
Absolute Lymphocytes: 1002 {cells}/uL (ref 850–3900)
Absolute Monocytes: 705 {cells}/uL (ref 200–950)
Basophils Absolute: 21 {cells}/uL (ref 0–200)
Basophils Relative: 0.4 %
Eosinophils Absolute: 170 {cells}/uL (ref 15–500)
Eosinophils Relative: 3.2 %
HCT: 34.1 % — ABNORMAL LOW (ref 39.4–51.1)
Hemoglobin: 10.9 g/dL — ABNORMAL LOW (ref 13.2–17.1)
MCH: 28.6 pg (ref 27.0–33.0)
MCHC: 32 g/dL (ref 31.6–35.4)
MCV: 89.5 fL (ref 81.4–101.7)
MPV: 10.5 fL (ref 7.5–12.5)
Monocytes Relative: 13.3 %
Neutro Abs: 3403 {cells}/uL (ref 1500–7800)
Neutrophils Relative %: 64.2 %
Platelets: 190 Thousand/uL (ref 140–400)
RBC: 3.81 Million/uL — ABNORMAL LOW (ref 4.20–5.80)
RDW: 11.6 % (ref 11.0–15.0)
Total Lymphocyte: 18.9 %
WBC: 5.3 Thousand/uL (ref 3.8–10.8)

## 2024-12-05 LAB — LIPID PANEL
Cholesterol: 130 mg/dL
HDL: 50 mg/dL
LDL Cholesterol (Calc): 62 mg/dL
Non-HDL Cholesterol (Calc): 80 mg/dL
Total CHOL/HDL Ratio: 2.6 (calc)
Triglycerides: 92 mg/dL

## 2024-12-05 LAB — COMPLETE METABOLIC PANEL WITHOUT GFR
AG Ratio: 2 (calc) (ref 1.0–2.5)
ALT: 22 U/L (ref 9–46)
AST: 19 U/L (ref 10–35)
Albumin: 4.3 g/dL (ref 3.6–5.1)
Alkaline phosphatase (APISO): 88 U/L (ref 35–144)
BUN/Creatinine Ratio: 15 (calc) (ref 6–22)
BUN: 20 mg/dL (ref 7–25)
CO2: 26 mmol/L (ref 20–32)
Calcium: 9.5 mg/dL (ref 8.6–10.3)
Chloride: 107 mmol/L (ref 98–110)
Creat: 1.31 mg/dL — ABNORMAL HIGH (ref 0.70–1.22)
Globulin: 2.1 g/dL (ref 1.9–3.7)
Glucose, Bld: 97 mg/dL (ref 65–99)
Potassium: 4.4 mmol/L (ref 3.5–5.3)
Sodium: 140 mmol/L (ref 135–146)
Total Bilirubin: 0.5 mg/dL (ref 0.2–1.2)
Total Protein: 6.4 g/dL (ref 6.1–8.1)

## 2024-12-05 LAB — TEST AUTHORIZATION

## 2024-12-05 LAB — VITAMIN D 25 HYDROXY (VIT D DEFICIENCY, FRACTURES): Vit D, 25-Hydroxy: 54 ng/mL (ref 30–100)

## 2024-12-05 NOTE — Addendum Note (Signed)
 Addended by: ANGELENA RONAL BRADLEY K on: 12/05/2024 12:57 PM   Modules accepted: Orders

## 2024-12-10 ENCOUNTER — Ambulatory Visit: Admitting: Family Medicine

## 2024-12-10 ENCOUNTER — Encounter: Payer: Self-pay | Admitting: Family Medicine

## 2024-12-10 VITALS — BP 118/60 | HR 72 | Temp 98.0°F | Ht 67.0 in | Wt 165.8 lb

## 2024-12-10 DIAGNOSIS — Z0000 Encounter for general adult medical examination without abnormal findings: Secondary | ICD-10-CM

## 2024-12-10 DIAGNOSIS — I10 Essential (primary) hypertension: Secondary | ICD-10-CM

## 2024-12-10 DIAGNOSIS — Z8673 Personal history of transient ischemic attack (TIA), and cerebral infarction without residual deficits: Secondary | ICD-10-CM

## 2024-12-10 DIAGNOSIS — R413 Other amnesia: Secondary | ICD-10-CM | POA: Diagnosis not present

## 2024-12-10 DIAGNOSIS — E7800 Pure hypercholesterolemia, unspecified: Secondary | ICD-10-CM | POA: Diagnosis not present

## 2024-12-10 NOTE — Progress Notes (Signed)
 "  Subjective:    Patient ID: Devin Valdez, male    DOB: [birth year], 84 y.o.   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  He also has a history of cervical spinal stenosis.   Wt Readings from Last 3 Encounters:  12/10/24 165 lb 12.8 oz (75.2 kg)  10/11/24 164 lb 8 oz (74.6 kg)  06/07/24 158 lb 12.8 oz (72 kg)  Patient's wife states that he sits around all day watching TV.  He does not speak very much.  He does not want to do anything.  Patient denies depression.  He denies anhedonia.  However he is very quiet and hesitant to speak.  I performed a Mini-Mental status exam.  The patient had a very difficult time performing serial sevens.  He constantly had a repeat the number to himself and would often make mistakes.  I believe the patient is having some aphasia related to age-related memory loss.  He continues to have a flat affect and bradykinesia.  He does not have a shuffling gait today however on exam.  I still feel that the patient has some mild parkinsonian features.  Neurology did not feel that that was the case.  He is due for a flu shot, RSV shot, shingles shot, and pneumonia shot.  He refuses all of these today Lab on 12/04/2024  Component Date Value Ref Range Status   WBC 12/04/2024 5.3  3.8 - 10.8 Thousand/uL Final   RBC 12/04/2024 3.81 (L)  4.20 - 5.80 Million/uL Final   Hemoglobin 12/04/2024 10.9 (L)  13.2 - 17.1 g/dL Final   HCT 98/85/7973 34.1 (L)  39.4 - 51.1 % Final   MCV 12/04/2024 89.5  81.4 - 101.7 fL Final   MCH 12/04/2024 28.6  27.0 - 33.0 pg Final   MCHC 12/04/2024 32.0  31.6 - 35.4 g/dL Final   RDW 98/85/7973 11.6  11.0 - 15.0 % Final   Platelets 12/04/2024 190  140 - 400 Thousand/uL Final   MPV 12/04/2024 10.5  7.5 - 12.5 fL Final   Neutro Abs 12/04/2024 3,403  1,500 - 7,800 cells/uL Final   Absolute Lymphocytes 12/04/2024 1,002  850 - 3,900 cells/uL Final    Absolute Monocytes 12/04/2024 705  200 - 950 cells/uL Final   Eosinophils Absolute 12/04/2024 170  15 - 500 cells/uL Final   Basophils Absolute 12/04/2024 21  0 - 200 cells/uL Final   Neutrophils Relative % 12/04/2024 64.2  % Final   Total Lymphocyte 12/04/2024 18.9  % Final   Monocytes Relative 12/04/2024 13.3  % Final   Eosinophils Relative 12/04/2024 3.2  % Final   Basophils Relative 12/04/2024 0.4  % Final   Glucose, Bld 12/04/2024 97  65 - 99 mg/dL Final   Comment: .            Fasting reference interval .    BUN 12/04/2024 20  7 - 25 mg/dL Final   Creat 98/85/7973 1.31 (H)  0.70 - 1.22 mg/dL Final   BUN/Creatinine Ratio 12/04/2024 15  6 - 22 (calc) Final   Sodium 12/04/2024 140  135 - 146 mmol/L Final   Potassium 12/04/2024 4.4  3.5 - 5.3 mmol/L Final   Chloride 12/04/2024 107  98 - 110 mmol/L Final   CO2 12/04/2024 26  20 - 32 mmol/L Final   Calcium  12/04/2024 9.5  8.6 - 10.3 mg/dL Final   Total Protein 98/85/7973  6.4  6.1 - 8.1 g/dL Final   Albumin 98/85/7973 4.3  3.6 - 5.1 g/dL Final   Globulin 98/85/7973 2.1  1.9 - 3.7 g/dL (calc) Final   AG Ratio 12/04/2024 2.0  1.0 - 2.5 (calc) Final   Total Bilirubin 12/04/2024 0.5  0.2 - 1.2 mg/dL Final   Alkaline phosphatase (APISO) 12/04/2024 88  35 - 144 U/L Final   AST 12/04/2024 19  10 - 35 U/L Final   ALT 12/04/2024 22  9 - 46 U/L Final   Cholesterol 12/04/2024 130  <200 mg/dL Final   HDL 98/85/7973 50  > OR = 40 mg/dL Final   Triglycerides 98/85/7973 92  <150 mg/dL Final   LDL Cholesterol (Calc) 12/04/2024 62  mg/dL (calc) Final   Comment: Reference range: <100 . Desirable range <100 mg/dL for primary prevention;   <70 mg/dL for patients with CHD or diabetic patients  with > or = 2 CHD risk factors. SABRA LDL-C is now calculated using the Martin-Hopkins  calculation, which is a validated novel method providing  better accuracy than the Friedewald equation in the  estimation of LDL-C.  Gladis APPLETHWAITE et al. SANDREA. 7986;689(80):  2061-2068  (http://education.[facility].com/faq/FAQ164)    Total CHOL/HDL Ratio 12/04/2024 2.6  <4.9 (calc) Final   Non-HDL Cholesterol (Calc) 12/04/2024 80  <130 mg/dL (calc) Final   Comment: For patients with diabetes plus 1 major ASCVD risk  factor, treating to a non-HDL-C goal of <100 mg/dL  (LDL-C of <29 mg/dL) is considered a therapeutic  option.    Vit D, 25-Hydroxy 12/04/2024 54  30 - 100 ng/mL Final   Comment: Vitamin D  Status         25-OH Vitamin D : . Deficiency:                    <20 ng/mL Insufficiency:             20 - 29 ng/mL Optimal:                 > or = 30 ng/mL . For 25-OH Vitamin D  testing on patients on  D2-supplementation and patients for whom quantitation  of D2 and D3 fractions is required, the Quest[facility]ureD(TM) 25-OH VIT D, (D2,D3), LC/MS/MS is recommended: order  code 07111 (patients >69yrs). . See Note 1 . Note 1 . For additional information, please refer to  http://education.[facility].com/faq/FAQ199  (This link is being provided for informational/ educational purposes only.)    TEST NAME: 12/04/2024 VITAMIN D ,25-OH,TOTAL,IA   Final   TEST CODE: 12/04/2024 [id number]   Final   CLIENT CONTACT: 12/04/2024 SUZEN RUMPS   Final   REPORT ALWAYS MESSAGE SIGNATURE 12/04/2024    Final   Comment: . The laboratory testing on this patient was verbally requested or confirmed by the ordering physician or his or her authorized representative after contact with an employee of [employer]. Federal regulations require that we maintain on file written authorization for all laboratory testing.  Accordingly we are asking that the ordering physician or his or her authorized representative sign a copy of this report and promptly return it to the client service representative. . . Signature:____________________________________________________ . Please fax this signed page to [phone] or return it via your [employer] courier.      Past Medical History:  Diagnosis Date   Anemia    Anxiety    Cervical stenosis of spinal canal    mri 8/19- severe   Depression    Falls  Hallucinations    Hyperlipidemia    Hypertension    Stroke [facility])    left paracentral pons infarct   Past Surgical History:  Procedure Laterality Date   Carotid Dopplers  12/2019   CVA evaluation: Bilateral ICA <40%.  Bilateral vertebral arteries with normal flow.  Bilateral subclavian arteries are normal flow.   EYE SURGERY     HERNIA REPAIR     POSTERIOR CERVICAL LAMINECTOMY     with fusion C3-C7 10/19- Dr. Bufford at [facility] ECHOCARDIOGRAM  11/2019   (CVA evaluation) EF 55 to 60%.  GR 1 DD.  No RWMA.  Normal atrial sizes.  Mild aortic valve sclerosis otherwise normal valves.   Current Outpatient Medications on File Prior to Visit  Medication Sig Dispense Refill   acetaminophen  (TYLENOL ) 325 MG tablet Take 650 mg by mouth every 6 (six) hours as needed for mild pain or headache.     aspirin  81 MG chewable tablet Chew by mouth daily.     atorvastatin  (LIPITOR ) 40 MG tablet TAKE ONE TABLET (40MG  TOTAL) BY MOUTH DAILY AT 6PM 90 tablet 1   Coenzyme Q10 (CO Q-10) 200 MG CAPS Take by mouth daily.     ferrous sulfate  325 (65 FE) MG tablet Take 1 tablet (325 mg total) by mouth 2 (two) times daily with a meal. 60 tablet 1   losartan  (COZAAR ) 50 MG tablet TAKE ONE TABLET BY MOUTH EVERY DAY FOR BLOOD PRESSURE 90 tablet 1   Multiple Vitamins-Minerals (CENTRUM SILVER  ADULT 50+) TABS Take 1 capsule by mouth 2 (two) times daily. 30 tablet 2   Ascorbic Acid (VITAMIN C) 1000 MG tablet Take 1,000 mg by mouth daily. (Patient not taking: Reported on 12/10/2024)     No current facility-administered medications on file prior to visit.   No Known Allergies Social History   Socioeconomic History   Marital status: Married    Spouse name: [name]   Number of children: Not on file   Years of education: Not on file   Highest education level:  Not on file  Occupational History   Occupation: retired  Tobacco Use   Smoking status: Former    Current packs/day: 0.00    Types: Cigarettes    Quit date: 11/21/1985    Years since quitting: 39.0   Smokeless tobacco: Never  Substance and Sexual Activity   Alcohol use: No   Drug use: No   Sexual activity: Yes  Other Topics Concern   Not on file  Social History Narrative   Not on file   Social Drivers of Health   Tobacco Use: Medium Risk (12/10/2024)   Patient History    Smoking Tobacco Use: Former    Smokeless Tobacco Use: Never    Passive Exposure: Not on Actuary Strain: Low Risk (04/18/2024)   Overall Financial Resource Strain (CARDIA)    Difficulty of Paying Living Expenses: Not hard at all  Food Insecurity: No Food Insecurity (04/18/2024)   Hunger Vital Sign    Worried About Running Out of Food in the Last Year: Never true    Ran Out of Food in the Last Year: Never true  Transportation Needs: No Transportation Needs (04/18/2024)   PRAPARE - Administrator, Civil Service (Medical): No    Lack of Transportation (Non-Medical): No  Physical Activity: Inactive (04/18/2024)   Exercise Vital Sign    Days of Exercise per Week: 0 days    Minutes of Exercise per Session: 0  min  Stress: No Stress Concern Present (04/18/2024)   [employer] of Occupational Health - Occupational Stress Questionnaire    Feeling of Stress : Not at all  Social Connections: Socially Integrated (04/18/2024)   Social Connection and Isolation Panel    Frequency of Communication with Friends and Family: Twice a week    Frequency of Social Gatherings with Friends and Family: Three times a week    Attends Religious Services: More than 4 times per year    Active Member of Clubs or Organizations: Yes    Attends Banker Meetings: 1 to 4 times per year    Marital Status: Married  Catering Manager Violence: Not At Risk (04/18/2024)   Humiliation, Afraid, Rape, and Kick  questionnaire    Fear of Current or Ex-Partner: No    Emotionally Abused: No    Physically Abused: No    Sexually Abused: No  Depression (PHQ2-9): Low Risk (04/18/2024)   Depression (PHQ2-9)    PHQ-2 Score: 1  Alcohol Screen: Low Risk (04/18/2024)   Alcohol Screen    Last Alcohol Screening Score (AUDIT): 0  Housing: Unknown (04/18/2024)   Housing Stability Vital Sign    Unable to Pay for Housing in the Last Year: No    Number of Times Moved in the Last Year: Not on file    Homeless in the Last Year: No  Utilities: Not At Risk (04/18/2024)   AHC Utilities    Threatened with loss of utilities: No  Health Literacy: Adequate Health Literacy (04/18/2024)   B1300 Health Literacy    Frequency of need for help with medical instructions: Never    Review of Systems  All other systems reviewed and are negative.      Objective:   Physical Exam Constitutional:      Appearance: Normal appearance. He is normal weight.  Cardiovascular:     Rate and Rhythm: Normal rate and regular rhythm. Frequent Extrasystoles are present.    Heart sounds: Normal heart sounds. No murmur heard.    No friction rub. No gallop.  Pulmonary:     Effort: Pulmonary effort is normal. No respiratory distress.     Breath sounds: Normal breath sounds. No stridor. No wheezing, rhonchi or rales.  Chest:     Chest wall: No tenderness.  Abdominal:     General: Abdomen is flat. Bowel sounds are normal.     Palpations: Abdomen is soft.  Musculoskeletal:     Right lower leg: No edema.     Left lower leg: No edema.  Neurological:     General: No focal deficit present.     Mental Status: He is alert and oriented to person, place, and time. Mental status is at baseline.     Cranial Nerves: No cranial nerve deficit.     Sensory: Sensation is intact. No sensory deficit.     Motor: No weakness or seizure activity.     Coordination: Coordination is intact. Coordination normal.     Gait: Gait normal.     Deep Tendon  Reflexes: Reflexes are normal and symmetric.           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  Today I feel that there is evidence of short-term memory loss on his Mini-Mental status exam.  It is very mild.  I encouraged the patient to try to keep more active.  He needs to get out of the house and stop watching TV.  I encouraged him to get active in small groups or conversations to try to preserve brain function.  I recommended reading books and performing crossword's and word searches.  Blood pressure today is excellent.  Lab work is excellent except for mild anemia.  The anemia is chronic.  Patient refuses pneumonia shot, shingles shot, flu shot, and tetanus shot "

## 2024-12-17 NOTE — Addendum Note (Signed)
 Addended by: DUANNE LOWERS T on: 12/17/2024 07:59 AM   Modules accepted: Level of Service

## 2025-04-24 ENCOUNTER — Encounter

## 2025-06-09 ENCOUNTER — Ambulatory Visit: Admitting: Family Medicine
